# Patient Record
Sex: Female | Born: 1940 | Race: White | Hispanic: No | Smoking: Former smoker
Health system: Southern US, Community
[De-identification: ages and names within clinical notes are randomized; demographics above are authoritative.]

## PROBLEM LIST (undated history)

## (undated) DIAGNOSIS — M51369 Other intervertebral disc degeneration, lumbar region without mention of lumbar back pain or lower extremity pain: Secondary | ICD-10-CM

## (undated) DIAGNOSIS — J449 Chronic obstructive pulmonary disease, unspecified: Secondary | ICD-10-CM

## (undated) DIAGNOSIS — G4733 Obstructive sleep apnea (adult) (pediatric): Secondary | ICD-10-CM

## (undated) DIAGNOSIS — B029 Zoster without complications: Principal | ICD-10-CM

## (undated) DIAGNOSIS — I5181 Takotsubo syndrome: Secondary | ICD-10-CM

## (undated) DIAGNOSIS — J9610 Chronic respiratory failure, unspecified whether with hypoxia or hypercapnia: Secondary | ICD-10-CM

## (undated) DIAGNOSIS — J45909 Unspecified asthma, uncomplicated: Secondary | ICD-10-CM

## (undated) DIAGNOSIS — I2510 Atherosclerotic heart disease of native coronary artery without angina pectoris: Secondary | ICD-10-CM

## (undated) DIAGNOSIS — F419 Anxiety disorder, unspecified: Secondary | ICD-10-CM

## (undated) DIAGNOSIS — I219 Acute myocardial infarction, unspecified: Secondary | ICD-10-CM

## (undated) DIAGNOSIS — K219 Gastro-esophageal reflux disease without esophagitis: Secondary | ICD-10-CM

## (undated) DIAGNOSIS — D649 Anemia, unspecified: Secondary | ICD-10-CM

## (undated) DIAGNOSIS — E119 Type 2 diabetes mellitus without complications: Secondary | ICD-10-CM

## (undated) DIAGNOSIS — Z8711 Personal history of peptic ulcer disease: Secondary | ICD-10-CM

## (undated) DIAGNOSIS — I10 Essential (primary) hypertension: Secondary | ICD-10-CM

## (undated) DIAGNOSIS — E785 Hyperlipidemia, unspecified: Secondary | ICD-10-CM

## (undated) DIAGNOSIS — I739 Peripheral vascular disease, unspecified: Secondary | ICD-10-CM

## (undated) DIAGNOSIS — M5136 Other intervertebral disc degeneration, lumbar region: Secondary | ICD-10-CM

## (undated) DIAGNOSIS — Z9981 Dependence on supplemental oxygen: Secondary | ICD-10-CM

## (undated) DIAGNOSIS — I872 Venous insufficiency (chronic) (peripheral): Secondary | ICD-10-CM

## (undated) DIAGNOSIS — E669 Obesity, unspecified: Secondary | ICD-10-CM

## (undated) HISTORY — PX: LAPAROSCOPIC CHOLECYSTECTOMY: SUR755

## (undated) HISTORY — DX: Takotsubo syndrome: I51.81

## (undated) HISTORY — DX: Zoster without complications: B02.9

## (undated) HISTORY — DX: Anxiety disorder, unspecified: F41.9

## (undated) HISTORY — DX: Chronic obstructive pulmonary disease, unspecified: J44.9

## (undated) HISTORY — PX: SHOULDER OPEN ROTATOR CUFF REPAIR: SHX2407

## (undated) HISTORY — DX: Type 2 diabetes mellitus without complications: E11.9

## (undated) HISTORY — DX: Obesity, unspecified: E66.9

## (undated) HISTORY — PX: TOTAL ABDOMINAL HYSTERECTOMY: SHX209

## (undated) HISTORY — DX: Acute myocardial infarction, unspecified: I21.9

## (undated) HISTORY — DX: Essential (primary) hypertension: I10

## (undated) HISTORY — DX: Atherosclerotic heart disease of native coronary artery without angina pectoris: I25.10

## (undated) HISTORY — PX: CATARACT EXTRACTION W/ INTRAOCULAR LENS IMPLANT: SHX1309

## (undated) HISTORY — PX: CORONARY ANGIOPLASTY WITH STENT PLACEMENT: SHX49

## (undated) HISTORY — DX: Venous insufficiency (chronic) (peripheral): I87.2

## (undated) HISTORY — DX: Hyperlipidemia, unspecified: E78.5

## (undated) HISTORY — DX: Anemia, unspecified: D64.9

---
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
## 1998-03-05 DIAGNOSIS — I219 Acute myocardial infarction, unspecified: Secondary | ICD-10-CM

## 1998-03-05 HISTORY — DX: Acute myocardial infarction, unspecified: I21.9

## 1998-07-14 ENCOUNTER — Inpatient Hospital Stay (HOSPITAL_COMMUNITY): Admission: EM | Admit: 1998-07-14 | Discharge: 1998-07-17 | Payer: Self-pay | Admitting: Emergency Medicine

## 1999-02-20 ENCOUNTER — Ambulatory Visit (HOSPITAL_COMMUNITY): Admission: RE | Admit: 1999-02-20 | Discharge: 1999-02-21 | Payer: Self-pay | Admitting: *Deleted

## 1999-04-16 ENCOUNTER — Emergency Department (HOSPITAL_COMMUNITY): Admission: EM | Admit: 1999-04-16 | Discharge: 1999-04-16 | Payer: Self-pay | Admitting: Emergency Medicine

## 1999-04-17 ENCOUNTER — Encounter: Payer: Self-pay | Admitting: Emergency Medicine

## 1999-10-04 HISTORY — PX: ALVEOLOPLASTY: SUR19

## 1999-10-05 ENCOUNTER — Inpatient Hospital Stay (HOSPITAL_COMMUNITY): Admission: AD | Admit: 1999-10-05 | Discharge: 1999-10-12 | Payer: Self-pay | Admitting: Cardiology

## 1999-10-05 ENCOUNTER — Encounter: Payer: Self-pay | Admitting: Cardiology

## 1999-10-16 ENCOUNTER — Encounter (HOSPITAL_COMMUNITY): Admission: RE | Admit: 1999-10-16 | Discharge: 2000-01-14 | Payer: Self-pay | Admitting: Dentistry

## 2000-05-01 ENCOUNTER — Encounter: Payer: Self-pay | Admitting: Pulmonary Disease

## 2000-05-01 ENCOUNTER — Encounter (INDEPENDENT_AMBULATORY_CARE_PROVIDER_SITE_OTHER): Payer: Self-pay | Admitting: *Deleted

## 2000-05-01 ENCOUNTER — Observation Stay (HOSPITAL_COMMUNITY): Admission: AD | Admit: 2000-05-01 | Discharge: 2000-05-05 | Payer: Self-pay | Admitting: Pulmonary Disease

## 2000-05-02 ENCOUNTER — Encounter: Payer: Self-pay | Admitting: Pulmonary Disease

## 2001-10-02 ENCOUNTER — Ambulatory Visit (HOSPITAL_BASED_OUTPATIENT_CLINIC_OR_DEPARTMENT_OTHER): Admission: RE | Admit: 2001-10-02 | Discharge: 2001-10-02 | Payer: Self-pay | Admitting: Pulmonary Disease

## 2003-05-30 ENCOUNTER — Emergency Department (HOSPITAL_COMMUNITY): Admission: EM | Admit: 2003-05-30 | Discharge: 2003-05-30 | Payer: Self-pay | Admitting: Emergency Medicine

## 2004-01-07 ENCOUNTER — Ambulatory Visit: Payer: Self-pay | Admitting: Pulmonary Disease

## 2004-02-24 ENCOUNTER — Ambulatory Visit: Payer: Self-pay | Admitting: Pulmonary Disease

## 2004-03-29 ENCOUNTER — Ambulatory Visit: Payer: Self-pay | Admitting: Pulmonary Disease

## 2004-04-28 ENCOUNTER — Ambulatory Visit: Payer: Self-pay | Admitting: Pulmonary Disease

## 2004-05-24 ENCOUNTER — Ambulatory Visit: Payer: Self-pay | Admitting: Pulmonary Disease

## 2004-06-12 ENCOUNTER — Ambulatory Visit (HOSPITAL_COMMUNITY): Admission: RE | Admit: 2004-06-12 | Discharge: 2004-06-12 | Payer: Self-pay | Admitting: Pulmonary Disease

## 2004-06-23 ENCOUNTER — Ambulatory Visit: Payer: Self-pay | Admitting: Pulmonary Disease

## 2004-07-21 ENCOUNTER — Ambulatory Visit: Payer: Self-pay | Admitting: Pulmonary Disease

## 2004-08-21 ENCOUNTER — Ambulatory Visit: Payer: Self-pay | Admitting: Pulmonary Disease

## 2004-09-18 ENCOUNTER — Ambulatory Visit: Payer: Self-pay | Admitting: Pulmonary Disease

## 2004-10-18 ENCOUNTER — Ambulatory Visit: Payer: Self-pay | Admitting: Pulmonary Disease

## 2004-11-15 ENCOUNTER — Ambulatory Visit: Payer: Self-pay | Admitting: Pulmonary Disease

## 2004-12-13 ENCOUNTER — Ambulatory Visit: Payer: Self-pay | Admitting: Pulmonary Disease

## 2005-01-26 ENCOUNTER — Ambulatory Visit: Payer: Self-pay | Admitting: Pulmonary Disease

## 2005-02-27 ENCOUNTER — Encounter: Admission: RE | Admit: 2005-02-27 | Discharge: 2005-02-27 | Payer: Self-pay | Admitting: Pulmonary Disease

## 2005-02-28 ENCOUNTER — Ambulatory Visit: Payer: Self-pay | Admitting: Pulmonary Disease

## 2005-03-13 ENCOUNTER — Ambulatory Visit: Payer: Self-pay | Admitting: Cardiology

## 2005-03-28 ENCOUNTER — Ambulatory Visit: Payer: Self-pay

## 2005-03-28 ENCOUNTER — Encounter: Payer: Self-pay | Admitting: Cardiology

## 2005-04-30 ENCOUNTER — Ambulatory Visit: Payer: Self-pay | Admitting: Cardiology

## 2005-05-29 ENCOUNTER — Ambulatory Visit: Payer: Self-pay | Admitting: Pulmonary Disease

## 2005-06-05 ENCOUNTER — Encounter (HOSPITAL_COMMUNITY): Admission: RE | Admit: 2005-06-05 | Discharge: 2005-09-03 | Payer: Self-pay | Admitting: Pulmonary Disease

## 2005-06-07 ENCOUNTER — Ambulatory Visit: Payer: Self-pay | Admitting: Pulmonary Disease

## 2005-06-25 ENCOUNTER — Encounter: Admission: RE | Admit: 2005-06-25 | Discharge: 2005-07-25 | Payer: Self-pay | Admitting: Pulmonary Disease

## 2005-06-26 ENCOUNTER — Ambulatory Visit: Payer: Self-pay | Admitting: Pulmonary Disease

## 2005-07-24 ENCOUNTER — Ambulatory Visit: Payer: Self-pay | Admitting: Pulmonary Disease

## 2005-08-28 ENCOUNTER — Ambulatory Visit: Payer: Self-pay | Admitting: Pulmonary Disease

## 2005-08-30 ENCOUNTER — Ambulatory Visit (HOSPITAL_COMMUNITY): Admission: RE | Admit: 2005-08-30 | Discharge: 2005-08-30 | Payer: Self-pay | Admitting: Pulmonary Disease

## 2005-09-04 ENCOUNTER — Encounter (HOSPITAL_COMMUNITY): Admission: RE | Admit: 2005-09-04 | Discharge: 2005-12-03 | Payer: Self-pay | Admitting: Pulmonary Disease

## 2005-09-25 ENCOUNTER — Ambulatory Visit: Payer: Self-pay | Admitting: Pulmonary Disease

## 2005-10-26 ENCOUNTER — Ambulatory Visit: Payer: Self-pay | Admitting: Pulmonary Disease

## 2005-10-30 ENCOUNTER — Ambulatory Visit: Payer: Self-pay | Admitting: Cardiology

## 2005-11-26 ENCOUNTER — Ambulatory Visit: Payer: Self-pay | Admitting: Pulmonary Disease

## 2005-11-27 ENCOUNTER — Ambulatory Visit: Payer: Self-pay | Admitting: Pulmonary Disease

## 2005-12-12 ENCOUNTER — Ambulatory Visit: Payer: Self-pay | Admitting: Gastroenterology

## 2006-01-16 ENCOUNTER — Ambulatory Visit: Payer: Self-pay | Admitting: Gastroenterology

## 2006-01-25 ENCOUNTER — Ambulatory Visit: Payer: Self-pay | Admitting: Pulmonary Disease

## 2006-02-05 ENCOUNTER — Ambulatory Visit: Payer: Self-pay | Admitting: Gastroenterology

## 2006-02-05 LAB — CONVERTED CEMR LAB
Fecal Occult Blood: NEGATIVE
OCCULT 1: NEGATIVE
OCCULT 2: NEGATIVE
OCCULT 3: NEGATIVE
OCCULT 5: NEGATIVE

## 2006-02-14 ENCOUNTER — Ambulatory Visit: Payer: Self-pay | Admitting: Pulmonary Disease

## 2006-04-12 ENCOUNTER — Ambulatory Visit: Payer: Self-pay | Admitting: Cardiology

## 2006-05-16 ENCOUNTER — Ambulatory Visit: Payer: Self-pay | Admitting: Pulmonary Disease

## 2006-06-10 ENCOUNTER — Ambulatory Visit: Payer: Self-pay | Admitting: Pulmonary Disease

## 2006-06-10 LAB — CONVERTED CEMR LAB
Calcium: 9.5 mg/dL (ref 8.4–10.5)
Chloride: 106 meq/L (ref 96–112)
Cholesterol: 157 mg/dL (ref 0–200)
Creatinine, Ser: 0.9 mg/dL (ref 0.4–1.2)
Eosinophils Absolute: 0.1 10*3/uL (ref 0.0–0.6)
GFR calc Af Amer: 81 mL/min
Glucose, Bld: 210 mg/dL — ABNORMAL HIGH (ref 70–99)
HCT: 35 % — ABNORMAL LOW (ref 36.0–46.0)
Iron: 57 ug/dL (ref 42–145)
MCHC: 33.3 g/dL (ref 30.0–36.0)
MCV: 87.1 fL (ref 78.0–100.0)
Monocytes Relative: 6.8 % (ref 3.0–11.0)
RBC: 4.01 M/uL (ref 3.87–5.11)
RDW: 13.7 % (ref 11.5–14.6)
Saturation Ratios: 14.2 % — ABNORMAL LOW (ref 20.0–50.0)
Sodium: 143 meq/L (ref 135–145)
Total Bilirubin: 0.5 mg/dL (ref 0.3–1.2)

## 2006-08-29 ENCOUNTER — Ambulatory Visit: Payer: Self-pay | Admitting: Cardiology

## 2006-08-29 ENCOUNTER — Ambulatory Visit: Payer: Self-pay | Admitting: Internal Medicine

## 2006-08-29 ENCOUNTER — Observation Stay (HOSPITAL_COMMUNITY): Admission: EM | Admit: 2006-08-29 | Discharge: 2006-08-30 | Payer: Self-pay | Admitting: Emergency Medicine

## 2006-08-30 ENCOUNTER — Encounter: Payer: Self-pay | Admitting: Cardiology

## 2006-09-10 ENCOUNTER — Ambulatory Visit: Payer: Self-pay

## 2006-09-17 ENCOUNTER — Ambulatory Visit: Payer: Self-pay | Admitting: Pulmonary Disease

## 2006-09-17 LAB — CONVERTED CEMR LAB
Chloride: 102 meq/L (ref 96–112)
Cholesterol: 185 mg/dL (ref 0–200)
GFR calc Af Amer: 92 mL/min
GFR calc non Af Amer: 76 mL/min
Glucose, Bld: 187 mg/dL — ABNORMAL HIGH (ref 70–99)
LDL Cholesterol: 109 mg/dL — ABNORMAL HIGH (ref 0–99)
Sodium: 144 meq/L (ref 135–145)
Triglycerides: 182 mg/dL — ABNORMAL HIGH (ref 0–149)
VLDL: 36 mg/dL (ref 0–40)

## 2006-09-18 ENCOUNTER — Ambulatory Visit: Payer: Self-pay

## 2007-01-02 DIAGNOSIS — I872 Venous insufficiency (chronic) (peripheral): Secondary | ICD-10-CM

## 2007-01-02 DIAGNOSIS — M810 Age-related osteoporosis without current pathological fracture: Secondary | ICD-10-CM | POA: Insufficient documentation

## 2007-01-02 DIAGNOSIS — E785 Hyperlipidemia, unspecified: Secondary | ICD-10-CM

## 2007-01-02 DIAGNOSIS — E119 Type 2 diabetes mellitus without complications: Secondary | ICD-10-CM

## 2007-01-02 DIAGNOSIS — K5900 Constipation, unspecified: Secondary | ICD-10-CM | POA: Insufficient documentation

## 2007-01-02 DIAGNOSIS — J449 Chronic obstructive pulmonary disease, unspecified: Secondary | ICD-10-CM

## 2007-01-02 DIAGNOSIS — M1990 Unspecified osteoarthritis, unspecified site: Secondary | ICD-10-CM | POA: Insufficient documentation

## 2007-01-02 DIAGNOSIS — K219 Gastro-esophageal reflux disease without esophagitis: Secondary | ICD-10-CM | POA: Insufficient documentation

## 2007-01-02 DIAGNOSIS — F411 Generalized anxiety disorder: Secondary | ICD-10-CM | POA: Insufficient documentation

## 2007-01-17 ENCOUNTER — Ambulatory Visit: Payer: Self-pay | Admitting: Pulmonary Disease

## 2007-01-17 LAB — CONVERTED CEMR LAB
Chloride: 104 meq/L (ref 96–112)
Cholesterol: 150 mg/dL (ref 0–200)
Creatinine, Ser: 0.9 mg/dL (ref 0.4–1.2)
GFR calc Af Amer: 81 mL/min
Total CHOL/HDL Ratio: 4.3
Triglycerides: 185 mg/dL — ABNORMAL HIGH (ref 0–149)
VLDL: 37 mg/dL (ref 0–40)

## 2007-04-08 ENCOUNTER — Encounter: Payer: Self-pay | Admitting: Pulmonary Disease

## 2007-05-01 ENCOUNTER — Telehealth: Payer: Self-pay | Admitting: Pulmonary Disease

## 2007-05-16 ENCOUNTER — Ambulatory Visit: Payer: Self-pay | Admitting: Pulmonary Disease

## 2007-05-16 DIAGNOSIS — J0100 Acute maxillary sinusitis, unspecified: Secondary | ICD-10-CM | POA: Insufficient documentation

## 2007-05-17 LAB — CONVERTED CEMR LAB
ALT: 23 units/L (ref 0–35)
Albumin: 3.4 g/dL — ABNORMAL LOW (ref 3.5–5.2)
Alkaline Phosphatase: 45 units/L (ref 39–117)
CO2: 31 meq/L (ref 19–32)
Eosinophils Absolute: 0.1 10*3/uL (ref 0.0–0.6)
Eosinophils Relative: 2.1 % (ref 0.0–5.0)
GFR calc Af Amer: 81 mL/min
GFR calc non Af Amer: 67 mL/min
Hemoglobin: 12.1 g/dL (ref 12.0–15.0)
LDL Cholesterol: 70 mg/dL (ref 0–99)
Lymphocytes Relative: 24.4 % (ref 12.0–46.0)
Monocytes Absolute: 0.5 10*3/uL (ref 0.2–0.7)
Monocytes Relative: 8.8 % (ref 3.0–11.0)
Neutro Abs: 3.6 10*3/uL (ref 1.4–7.7)
Neutrophils Relative %: 64 % (ref 43.0–77.0)
Platelets: 222 10*3/uL (ref 150–400)
Potassium: 4.8 meq/L (ref 3.5–5.1)
Total Protein: 6.3 g/dL (ref 6.0–8.3)
Triglycerides: 193 mg/dL — ABNORMAL HIGH (ref 0–149)
WBC: 5.5 10*3/uL (ref 4.5–10.5)

## 2007-05-21 ENCOUNTER — Ambulatory Visit: Payer: Self-pay | Admitting: Cardiology

## 2007-05-29 LAB — CONVERTED CEMR LAB: Vit D, 1,25-Dihydroxy: 15 — ABNORMAL LOW (ref 30–89)

## 2007-09-18 ENCOUNTER — Ambulatory Visit: Payer: Self-pay | Admitting: Pulmonary Disease

## 2007-09-18 DIAGNOSIS — J209 Acute bronchitis, unspecified: Secondary | ICD-10-CM | POA: Insufficient documentation

## 2007-09-20 LAB — CONVERTED CEMR LAB
AST: 18 units/L (ref 0–37)
Albumin: 3.6 g/dL (ref 3.5–5.2)
Alkaline Phosphatase: 45 units/L (ref 39–117)
BUN: 15 mg/dL (ref 6–23)
Basophils Relative: 0.8 % (ref 0.0–3.0)
CO2: 33 meq/L — ABNORMAL HIGH (ref 19–32)
Calcium: 9 mg/dL (ref 8.4–10.5)
Cholesterol: 183 mg/dL (ref 0–200)
Eosinophils Relative: 2.6 % (ref 0.0–5.0)
GFR calc Af Amer: 80 mL/min
GFR calc non Af Amer: 66 mL/min
Glucose, Bld: 197 mg/dL — ABNORMAL HIGH (ref 70–99)
Hgb A1c MFr Bld: 7.1 % — ABNORMAL HIGH (ref 4.6–6.0)
LDL Cholesterol: 117 mg/dL — ABNORMAL HIGH (ref 0–99)
Lymphocytes Relative: 27.5 % (ref 12.0–46.0)
MCV: 93.6 fL (ref 78.0–100.0)
Neutro Abs: 2.6 10*3/uL (ref 1.4–7.7)
Neutrophils Relative %: 60.2 % (ref 43.0–77.0)
Platelets: 237 10*3/uL (ref 150–400)
Potassium: 4.3 meq/L (ref 3.5–5.1)
Sodium: 141 meq/L (ref 135–145)
Total CHOL/HDL Ratio: 4.6
Total Protein: 6.6 g/dL (ref 6.0–8.3)
Triglycerides: 129 mg/dL (ref 0–149)

## 2007-10-02 ENCOUNTER — Ambulatory Visit (HOSPITAL_COMMUNITY): Admission: RE | Admit: 2007-10-02 | Discharge: 2007-10-02 | Payer: Self-pay | Admitting: Ophthalmology

## 2007-11-07 ENCOUNTER — Ambulatory Visit: Payer: Self-pay | Admitting: Cardiology

## 2007-11-25 ENCOUNTER — Telehealth (INDEPENDENT_AMBULATORY_CARE_PROVIDER_SITE_OTHER): Payer: Self-pay | Admitting: *Deleted

## 2007-12-24 ENCOUNTER — Telehealth (INDEPENDENT_AMBULATORY_CARE_PROVIDER_SITE_OTHER): Payer: Self-pay | Admitting: *Deleted

## 2007-12-25 ENCOUNTER — Ambulatory Visit: Payer: Self-pay | Admitting: Internal Medicine

## 2008-01-06 ENCOUNTER — Telehealth (INDEPENDENT_AMBULATORY_CARE_PROVIDER_SITE_OTHER): Payer: Self-pay | Admitting: *Deleted

## 2008-01-16 ENCOUNTER — Ambulatory Visit: Payer: Self-pay | Admitting: Pulmonary Disease

## 2008-01-16 DIAGNOSIS — M79609 Pain in unspecified limb: Secondary | ICD-10-CM

## 2008-01-17 LAB — CONVERTED CEMR LAB
CO2: 33 meq/L — ABNORMAL HIGH (ref 19–32)
Chloride: 104 meq/L (ref 96–112)
GFR calc non Af Amer: 30 mL/min
Glucose, Bld: 133 mg/dL — ABNORMAL HIGH (ref 70–99)
Hgb A1c MFr Bld: 6.9 % — ABNORMAL HIGH (ref 4.6–6.0)

## 2008-01-20 ENCOUNTER — Encounter: Payer: Self-pay | Admitting: Pulmonary Disease

## 2008-01-20 ENCOUNTER — Ambulatory Visit: Payer: Self-pay

## 2008-01-22 ENCOUNTER — Telehealth (INDEPENDENT_AMBULATORY_CARE_PROVIDER_SITE_OTHER): Payer: Self-pay | Admitting: *Deleted

## 2008-02-12 ENCOUNTER — Telehealth (INDEPENDENT_AMBULATORY_CARE_PROVIDER_SITE_OTHER): Payer: Self-pay | Admitting: *Deleted

## 2008-03-01 ENCOUNTER — Telehealth: Payer: Self-pay | Admitting: Pulmonary Disease

## 2008-03-03 ENCOUNTER — Encounter: Payer: Self-pay | Admitting: Pulmonary Disease

## 2008-03-08 ENCOUNTER — Telehealth (INDEPENDENT_AMBULATORY_CARE_PROVIDER_SITE_OTHER): Payer: Self-pay | Admitting: *Deleted

## 2008-03-09 ENCOUNTER — Telehealth (INDEPENDENT_AMBULATORY_CARE_PROVIDER_SITE_OTHER): Payer: Self-pay | Admitting: *Deleted

## 2008-03-12 ENCOUNTER — Telehealth (INDEPENDENT_AMBULATORY_CARE_PROVIDER_SITE_OTHER): Payer: Self-pay | Admitting: *Deleted

## 2008-03-13 ENCOUNTER — Encounter: Payer: Self-pay | Admitting: Pulmonary Disease

## 2008-03-15 ENCOUNTER — Ambulatory Visit: Payer: Self-pay | Admitting: Internal Medicine

## 2008-03-15 ENCOUNTER — Ambulatory Visit: Payer: Self-pay | Admitting: Adult Health

## 2008-03-17 ENCOUNTER — Telehealth (INDEPENDENT_AMBULATORY_CARE_PROVIDER_SITE_OTHER): Payer: Self-pay | Admitting: *Deleted

## 2008-03-24 ENCOUNTER — Telehealth (INDEPENDENT_AMBULATORY_CARE_PROVIDER_SITE_OTHER): Payer: Self-pay | Admitting: *Deleted

## 2008-04-01 ENCOUNTER — Encounter: Payer: Self-pay | Admitting: Pulmonary Disease

## 2008-04-15 ENCOUNTER — Encounter: Payer: Self-pay | Admitting: Pulmonary Disease

## 2008-04-19 ENCOUNTER — Ambulatory Visit: Payer: Self-pay | Admitting: Pulmonary Disease

## 2008-04-20 LAB — CONVERTED CEMR LAB
BUN: 14 mg/dL (ref 6–23)
Basophils Relative: 0.4 % (ref 0.0–3.0)
Bilirubin, Direct: 0.1 mg/dL (ref 0.0–0.3)
Calcium: 9.5 mg/dL (ref 8.4–10.5)
Creatinine, Ser: 0.9 mg/dL (ref 0.4–1.2)
Eosinophils Relative: 2.7 % (ref 0.0–5.0)
GFR calc non Af Amer: 66 mL/min
Glucose, Bld: 134 mg/dL — ABNORMAL HIGH (ref 70–99)
HDL: 42.5 mg/dL (ref >39.0)
Hemoglobin: 11.9 g/dL — ABNORMAL LOW (ref 12.0–15.0)
Hgb A1c MFr Bld: 7 % — ABNORMAL HIGH (ref 4.6–6.0)
LDL Cholesterol: 85 mg/dL (ref 0–99)
Lymphocytes Relative: 28.4 % (ref 12.0–46.0)
Monocytes Absolute: 0.3 10*3/uL (ref 0.1–1.0)
Monocytes Relative: 7.3 % (ref 3.0–12.0)
Neutro Abs: 3 10*3/uL (ref 1.4–7.7)
Neutrophils Relative %: 61.2 % (ref 43.0–77.0)
Platelets: 245 10*3/uL (ref 150–400)
RDW: 13 % (ref 11.5–14.6)
Saturation Ratios: 11.5 % — ABNORMAL LOW (ref 20.0–50.0)
Sodium: 141 meq/L (ref 135–145)
Total Bilirubin: 0.5 mg/dL (ref 0.3–1.2)
Total Protein: 6.5 g/dL (ref 6.0–8.3)
Transferrin: 316 mg/dL (ref >=212.0)
Triglycerides: 137 mg/dL (ref 0–149)

## 2008-05-13 ENCOUNTER — Encounter: Payer: Self-pay | Admitting: Pulmonary Disease

## 2008-06-18 LAB — CONVERTED CEMR LAB: Vit D, 25-Hydroxy: 26 ng/mL — ABNORMAL LOW (ref 30–89)

## 2008-06-23 ENCOUNTER — Encounter: Payer: Self-pay | Admitting: Pulmonary Disease

## 2008-07-15 ENCOUNTER — Encounter: Payer: Self-pay | Admitting: Pulmonary Disease

## 2008-10-04 ENCOUNTER — Telehealth (INDEPENDENT_AMBULATORY_CARE_PROVIDER_SITE_OTHER): Payer: Self-pay | Admitting: *Deleted

## 2008-12-08 ENCOUNTER — Encounter (INDEPENDENT_AMBULATORY_CARE_PROVIDER_SITE_OTHER): Payer: Self-pay | Admitting: *Deleted

## 2008-12-08 ENCOUNTER — Ambulatory Visit: Payer: Self-pay | Admitting: Pulmonary Disease

## 2008-12-08 LAB — CONVERTED CEMR LAB
CO2: 35 meq/L
Creatinine, Ser: 1.1 mg/dL
GFR calc non Af Amer: 52.45 mL/min
Glucose, Bld: 151 mg/dL
HDL: 38.7 mg/dL
LDL Cholesterol: 71 mg/dL

## 2008-12-11 DIAGNOSIS — D649 Anemia, unspecified: Secondary | ICD-10-CM | POA: Insufficient documentation

## 2008-12-11 LAB — CONVERTED CEMR LAB
ALT: 21 units/L (ref 0–35)
AST: 18 units/L (ref 0–37)
BUN: 14 mg/dL (ref 6–23)
Basophils Relative: 0.3 % (ref 0.0–3.0)
Chloride: 102 meq/L (ref 96–112)
Cholesterol: 129 mg/dL (ref 0–200)
Creatinine, Ser: 1.1 mg/dL (ref 0.4–1.2)
Eosinophils Absolute: 0.1 10*3/uL (ref 0.0–0.7)
Eosinophils Relative: 2.5 % (ref 0.0–5.0)
GFR calc non Af Amer: 52.45 mL/min (ref >60)
Glucose, Bld: 151 mg/dL — ABNORMAL HIGH (ref 70–99)
Iron: 50 ug/dL (ref 42–145)
MCHC: 33.2 g/dL (ref 30.0–36.0)
MCV: 93.8 fL (ref 78.0–100.0)
Neutro Abs: 3.5 10*3/uL (ref 1.4–7.7)
Neutrophils Relative %: 68.4 % (ref 43.0–77.0)
Platelets: 220 10*3/uL (ref 150.0–400.0)
RDW: 12.3 % (ref 11.5–14.6)
Saturation Ratios: 11.7 % — ABNORMAL LOW (ref 20.0–50.0)
TSH: 1.1 microintl units/mL (ref 0.35–5.50)
Total CHOL/HDL Ratio: 3
Total Protein: 6.6 g/dL (ref 6.0–8.3)
Triglycerides: 96 mg/dL (ref 0.0–149.0)
WBC: 5 10*3/uL (ref 4.5–10.5)

## 2008-12-27 ENCOUNTER — Telehealth (INDEPENDENT_AMBULATORY_CARE_PROVIDER_SITE_OTHER): Payer: Self-pay | Admitting: *Deleted

## 2009-01-31 ENCOUNTER — Telehealth: Payer: Self-pay | Admitting: Pulmonary Disease

## 2009-02-14 ENCOUNTER — Telehealth (INDEPENDENT_AMBULATORY_CARE_PROVIDER_SITE_OTHER): Payer: Self-pay | Admitting: *Deleted

## 2009-02-24 ENCOUNTER — Telehealth (INDEPENDENT_AMBULATORY_CARE_PROVIDER_SITE_OTHER): Payer: Self-pay | Admitting: *Deleted

## 2009-04-15 ENCOUNTER — Encounter: Payer: Self-pay | Admitting: Pulmonary Disease

## 2009-04-19 ENCOUNTER — Encounter (INDEPENDENT_AMBULATORY_CARE_PROVIDER_SITE_OTHER): Payer: Self-pay | Admitting: *Deleted

## 2009-04-26 ENCOUNTER — Ambulatory Visit: Payer: Self-pay | Admitting: Pulmonary Disease

## 2009-04-27 ENCOUNTER — Ambulatory Visit: Payer: Self-pay | Admitting: Cardiology

## 2009-04-27 DIAGNOSIS — Z9861 Coronary angioplasty status: Secondary | ICD-10-CM

## 2009-04-27 DIAGNOSIS — I2510 Atherosclerotic heart disease of native coronary artery without angina pectoris: Secondary | ICD-10-CM

## 2009-04-27 DIAGNOSIS — I10 Essential (primary) hypertension: Secondary | ICD-10-CM

## 2009-04-28 LAB — CONVERTED CEMR LAB
AST: 20 units/L (ref 0–37)
Alkaline Phosphatase: 66 units/L (ref 39–117)
Bilirubin, Direct: 0.1 mg/dL (ref 0.0–0.3)
Calcium: 9.1 mg/dL (ref 8.4–10.5)
Eosinophils Absolute: 0.1 10*3/uL (ref 0.0–0.7)
Eosinophils Relative: 2 % (ref 0.0–5.0)
GFR calc non Af Amer: 58.48 mL/min (ref >60)
Hgb A1c MFr Bld: 8.1 % — ABNORMAL HIGH (ref 4.6–6.5)
Lymphocytes Relative: 22.7 % (ref 12.0–46.0)
Lymphs Abs: 1.4 10*3/uL (ref 0.7–4.0)
MCHC: 33.5 g/dL (ref 30.0–36.0)
MCV: 94.3 fL (ref 78.0–100.0)
Monocytes Relative: 7.9 % (ref 3.0–12.0)
Platelets: 225 10*3/uL (ref 150.0–400.0)
Potassium: 4.5 meq/L (ref 3.5–5.1)
Saturation Ratios: 13.8 % — ABNORMAL LOW (ref 20.0–50.0)
Total Bilirubin: 0.7 mg/dL (ref 0.3–1.2)
Vit D, 25-Hydroxy: 27 ng/mL — ABNORMAL LOW (ref 30–89)

## 2009-06-08 ENCOUNTER — Encounter: Payer: Self-pay | Admitting: Pulmonary Disease

## 2009-06-09 ENCOUNTER — Encounter: Payer: Self-pay | Admitting: Cardiology

## 2009-09-09 ENCOUNTER — Encounter: Payer: Self-pay | Admitting: Pulmonary Disease

## 2009-10-24 ENCOUNTER — Ambulatory Visit: Payer: Self-pay | Admitting: Pulmonary Disease

## 2009-10-30 LAB — CONVERTED CEMR LAB
CO2: 32 meq/L (ref 19–32)
Calcium: 9.1 mg/dL (ref 8.4–10.5)
Cholesterol: 130 mg/dL (ref 0–200)
Glucose, Bld: 215 mg/dL — ABNORMAL HIGH (ref 70–99)
HDL: 30 mg/dL — ABNORMAL LOW (ref >39.00)
Potassium: 4.5 meq/L (ref 3.5–5.1)
TSH: 0.83 microintl units/mL (ref 0.35–5.50)

## 2009-11-22 ENCOUNTER — Telehealth (INDEPENDENT_AMBULATORY_CARE_PROVIDER_SITE_OTHER): Payer: Self-pay | Admitting: *Deleted

## 2009-11-22 DIAGNOSIS — M25569 Pain in unspecified knee: Secondary | ICD-10-CM

## 2009-11-23 ENCOUNTER — Ambulatory Visit: Payer: Self-pay | Admitting: Cardiology

## 2009-12-01 ENCOUNTER — Encounter: Payer: Self-pay | Admitting: Pulmonary Disease

## 2009-12-07 ENCOUNTER — Encounter (INDEPENDENT_AMBULATORY_CARE_PROVIDER_SITE_OTHER): Payer: Self-pay | Admitting: *Deleted

## 2009-12-07 ENCOUNTER — Ambulatory Visit: Payer: Self-pay | Admitting: Cardiology

## 2009-12-19 ENCOUNTER — Encounter: Payer: Self-pay | Admitting: Cardiology

## 2009-12-20 LAB — CONVERTED CEMR LAB
BUN: 27 mg/dL — ABNORMAL HIGH (ref 6–23)
Calcium: 9.5 mg/dL (ref 8.4–10.5)
Chloride: 104 meq/L (ref 96–112)
Glucose, Bld: 139 mg/dL — ABNORMAL HIGH (ref 70–99)

## 2009-12-21 ENCOUNTER — Encounter: Payer: Self-pay | Admitting: Cardiology

## 2009-12-23 ENCOUNTER — Telehealth (INDEPENDENT_AMBULATORY_CARE_PROVIDER_SITE_OTHER): Payer: Self-pay | Admitting: *Deleted

## 2010-01-18 ENCOUNTER — Telehealth (INDEPENDENT_AMBULATORY_CARE_PROVIDER_SITE_OTHER): Payer: Self-pay | Admitting: *Deleted

## 2010-02-28 ENCOUNTER — Telehealth: Payer: Self-pay | Admitting: Pulmonary Disease

## 2010-03-22 ENCOUNTER — Telehealth: Payer: Self-pay | Admitting: Pulmonary Disease

## 2010-03-23 ENCOUNTER — Telehealth: Payer: Self-pay | Admitting: Pulmonary Disease

## 2010-04-06 NOTE — Progress Notes (Signed)
Summary: referral req to ortho----knee pain  Phone Note Call from Patient Call back at [facility] Phone [phone]   Caller: Patient Call For: nadel Summary of Call: pt wants an ortho referral. says right knee keeps "going out on her". when she walks she almost falls. says she has some pain in it "just before this happens. also says her two big toes feel numb. (worse in right big toe- same leg that she has knee problems). note: pt is diabetic as well Initial call taken by: Tivis Ringer, CNA,  November 22, 2009 2:08 PM  Follow-up for Phone Call        called and spoke with pt. pt states she would like to be referred to ortho for R knee pain.  Pt states pain has been going on "for awhile" but yesterday she "almost fell 5 x d/t the pain and feeling like her knee was giving out on her."  Asked pt if she has ever seen Ortho before and pt states she couldn't remember.   Pt also wanted SN to be aware she has noticed her "big toe" on both feet going numb recently.  Pt states she is diabetic and is unsure if her DM is causing this.  Pt states her toes are pink in color and denies any trauma to them to have caused the numbness.  Will forward message to SN to address.  Aundra Millet Reynolds LPN  November 22, 2009 2:26 PM   allergies: Brethine, Codeine  Additional Follow-up for Phone Call Additional follow up Details #1::        per SN, ok to refer to orth for eval of her knee- either Dr. Despina Hick or whoever ortho's choice is to eval knees.  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   called and spoke with pt.  pt is aware we are going to refer her to ortho for knee pain.  Pt also aware that numbness in toes is probably related to DM.  Pt verbalized understanding.  Nothing further needed.  Arman Filter LPN  November 22, 2009 3:41 PM   New Problems: KNEE PAIN, RIGHT (ICD-719.46)   New Problems: KNEE PAIN, RIGHT (ICD-719.46)

## 2010-04-06 NOTE — Letter (Signed)
Summary: BP LOG  BP LOG   Imported By: Faythe Ghee 12/07/2009 13:26:58  _____________________________________________________________________  External Attachment:    Type:   Image     Comment:   External Document

## 2010-04-06 NOTE — Letter (Signed)
Summary: [facility] [employer]  [address] [place] [address]   Phone: [phone]  Fax: [phone]     April 27, 2009 MRN: [id number]   [name] [address] [name], [name]      YOUR LAB WORK IS DUE  October 24, 2009 _________________________________________  Please go to [facility], located across the street from [facility] on the second floor.  Hours are Monday - Friday 7am until 7:30pm         Saturday 8am until 12noon    _X_  DO NOT EAT OR DRINK AFTER MIDNIGHT EVENING PRIOR TO LABWORK  __ YOUR LABWORK IS NOT FASTING --YOU MAY EAT PRIOR TO LABWORK

## 2010-04-06 NOTE — Assessment & Plan Note (Signed)
Summary: f44m  Medications Added LISINOPRIL 20 MG TABS (LISINOPRIL) take 1 tablet by mouth once daily      Allergies Added:   Visit Type:  Follow-up Primary Provider:  Dr. Alroy Dust   History of Present Illness: 70 year old woman presents for a followup visit. She just saw Dr. Kriste Basque yesterday for a routine visit. From a cardiac perspective, she denies any progressive chest pain or breathlessness, NYHA class III at baseline. She has not used any sublingual nitroglycerin. Denies any hospitalizations.  Lipid profile from October 2010 revealed LDL 71, HDL 38, triglycerides 96, total cholesterol 129. Followup labs per Dr. Kriste Basque from February 22 revealed potassium 4.5, BUN 12, creatinine 1.0, AST 20, ALT 21, hemoglobin 13.4, platelets 225.  Blood pressure is elevated today, and she states that this has been the general trend of late. She reports compliance with her medications. He talked about a low sodium diet, and advancing medical therapy.  Current Medications (verified): 1)  Flonase 50 Mcg/act  Susp (Fluticasone Propionate) .... Use 2 Puffs in Each Nostril Twice Daily.Marland KitchenMarland Kitchen 2)  Albuterol Sulfate (2.5 Mg/31ml) 0.083% Nebu (Albuterol Sulfate) .... Inhale 1 Vial Via Hhn Four Times A Day 3)  Ipratropium Bromide 0.02 % Soln (Ipratropium Bromide) .... Inhale 1 Vial Via Hhn Four Times A Day 4)  Pulmicort 0.25 Mg/49ml  Susp (Budesonide) .... Use in Neb Two Times A Day 5)  Mucinex 600 Mg  Tb12 (Guaifenesin) .... Take 2 By Mouth Two Times A Day W/ Plenty of Fluids.Marland KitchenMarland Kitchen 6)  Aspirin 81 Mg  Tbec (Aspirin) .... Take 1 By Mouth Once Daily 7)  Nitrostat 0.3 Mg  Subl (Nitroglycerin) .... As Needed As Directed 8)  Atenolol 50 Mg  Tabs (Atenolol) .... Take 1 By Mouth Once Daily 9)  Lisinopril 20 Mg Tabs (Lisinopril) .... Take 1 Tablet By Mouth Once Daily 10)  Furosemide 40 Mg  Tabs (Furosemide) .... Take 1 By Mouth Once Daily, May Use 1 Extra Daily As Needed Swelling 11)  Simvastatin 80 Mg Tabs (Simvastatin)  .... Take 1 Tab By Mouth At Bedtime. Please Call Office For An Appt. 12)  Humulin 70/30 70-30 %  Susp (Insulin Isophane & Regular) .... Inject 20  Units Subcutaneously in The Morning and 15  Units Subcutaneously in The Evening 13)  Glucophage 500 Mg  Tabs (Metformin Hcl) .... Take 1 By Mouth Two Times A Day 14)  Omeprazole 20 Mg Tbec (Omeprazole) .... Take 1 Tab By Mouth Once Daily... 15)  Etodolac 400 Mg  Tabs (Etodolac) .... Take 1 By Mouth Every 12 Hours 16)  Boniva 150 Mg  Tabs (Ibandronate Sodium) .... Take One Tablet By Mouth Every Month 17)  Oscal 500/200 D-3 500-200 Mg-Unit  Tabs (Calcium-Vitamin D) .... Take One By Mouth Once Daily 18)  Vitamin D 1000 Unit Tabs (Cholecalciferol) .... Take 1 Cap Daily... 19)  Gabapentin 100 Mg Caps (Gabapentin) .... Take 1 By Mouth At Bedtime For 10d, Then 2 At Bedtime For 10d, Then 3 At Bedtime Thereafter... 20)  Alprazolam 0.5 Mg  Tabs (Alprazolam) .... Take 1/2 -1 Tablet By Mouth Three Times A Day As Needed 21)  Iron 325 (65 Fe) Mg  Tabs (Ferrous Sulfate) .... Take 1 By Mouth Once Daily 22)  Onetouch Ultra Test   Strp (Glucose Blood) .... Test Blood Sugar Two Times A Day 23)  Amoxicillin-Pot Clavulanate 875-125 Mg Tabs (Amoxicillin-Pot Clavulanate) .... Take 1 Tab By Mouth Two Times A Day Til Gone...  Allergies (verified): 1)  ! Codeine 2)  !  Brethine (Terbutaline Sulfate)  Past History:  Past Medical History: Last updated: 04/06/2009 Anemia Anxiety Arthritis CAD - PTCA/BMS LAD 2000, PTCA/brachytherapy LAD 2001, LVEF 55% C O P D Diabetes Type 2 G E R D Hyperlipidemia Hypertension Osteoporosis Venous insufficiency Obesity Myocardial Infarction - AMI 2000  Social History: Last updated: 04/06/2009 Widowed  Tobacco Use - Former, quit 2000 Alcohol Use - no Drug Use - no  Clinical Review Panels:  Echocardiogram Echocardiogram  -  LVEF is approximately 55% with hypokineiss of the distal         inferior, mid/distal inferoseptal  walls. Overall left         ventricular systolic function was normal. Left ventricular         wall thickness was mildly increased.   -  Difficult acoustic windows, especially apically.    IMPRESSIONS   -  Difficult acoustic windows, especially apically. (08/30/2006)    Review of Systems  The patient denies anorexia, fever, vision loss, syncope, peripheral edema, prolonged cough, hemoptysis, melena, and hematochezia.         She reports occasional belching and reflux, with a hiatal hernia. No recent cough or wheezing, no fevers or chills, appetite stable. No regular exercise. Otherwise reviewed and negative except as outlined above.  Vital Signs:  Patient profile:   70 year old female Weight:      238 pounds O2 Sat:      98 % on 2 L/min Pulse rate:   63 / minute BP sitting:   210 / 92  (right arm)  Vitals Entered By: Dreama Saa, CNA (April 27, 2009 10:55 AM)  O2 Flow:  2 L/min  Physical Exam  Additional Exam:  Obese woman in no acute distress. HEENT: Conjunctiva and lids normal, oropharynx is moist mucosa. Neck: Supple, no carotid bruits or thyromegaly. Lungs: Significantly diminished breath sounds throughout, nonlabored, no wheezing. Cardiac: Distant regular heart sounds, soft systolic murmur at the base, no S3. Abdomen: Obese, nontender, no organomegaly, bowel sounds present. Skin: Warm and dry. Extremities: Trace ankle edema. Distal pulses one plus. Musculoskeletal: No kyphosis. Neuropsychiatric: Alert and oriented x3, affect appropriate.   EKG  Procedure date:  04/27/2009  Findings:      Normal sinus rhythm at 61 beats per minute with left anterior fascicular block, poor anterior R-wave progression, diffuse nonspecific ST-T wave changes.  Impression & Recommendations:  Problem # 1:  CORONARY ATHEROSCLEROSIS NATIVE CORONARY ARTERY (ICD-414.01)  Symptomatically stable on present medical regimen. Electrocardiogram is chronically abnormal. No nitroglycerin  use at this time. Followup noninvasive ischemic testing is not being pursued at this time, likely fraught with complications secondary to the patient's severe lung disease, and also body habitus. We will follow her symptomatically at this point, I will see her back in 6 months.  Her updated medication list for this problem includes:    Aspirin 81 Mg Tbec (Aspirin) .Marland Kitchen... Take 1 by mouth once daily    Nitrostat 0.3 Mg Subl (Nitroglycerin) .Marland Kitchen... As needed as directed    Atenolol 50 Mg Tabs (Atenolol) .Marland Kitchen... Take 1 by mouth once daily    Lisinopril 20 Mg Tabs (Lisinopril) .Marland Kitchen... Take 1 tablet by mouth once daily  Problem # 2:  ESSENTIAL HYPERTENSION, BENIGN (ICD-401.1)  Blood pressure poorly controlled. Plan to advance lisinopril to 20 mg daily, and further up titration may be necessary. She will otherwise continue her present regimen.  Her updated medication list for this problem includes:    Aspirin 81 Mg Tbec (Aspirin) .Marland KitchenMarland KitchenMarland KitchenMarland Kitchen  Take 1 by mouth once daily    Atenolol 50 Mg Tabs (Atenolol) .Marland Kitchen... Take 1 by mouth once daily    Lisinopril 20 Mg Tabs (Lisinopril) .Marland Kitchen... Take 1 tablet by mouth once daily    Furosemide 40 Mg Tabs (Furosemide) .Marland Kitchen... Take 1 by mouth once daily, may use 1 extra daily as needed swelling  Problem # 3:  HYPERLIPIDEMIA (ICD-272.4)  LDL from October 2010 was at goal and recent liver function tests are normal. Continue present medication.  Her updated medication list for this problem includes:    Simvastatin 80 Mg Tabs (Simvastatin) .Marland Kitchen... Take 1 tab by mouth at bedtime. please call office for an appt.  Future Orders: T-Lipid Profile [id number]) ... 10/24/2009 T-Hepatic Function [id number]) ... 10/24/2009  Patient Instructions: 1)  Your physician recommends that you schedule a follow-up appointment in: 6 months 2)  Your physician has recommended you make the following change in your medication: increase Lisinopril to 20mg  by mouth once daily  3)  Your physician recommends  that you return for lab work in: 6 months Prescriptions: LISINOPRIL 20 MG TABS (LISINOPRIL) take 1 tablet by mouth once daily  #30 x 5   Entered by:   Larita Fife Via LPN   Authorized by:   Loreli Slot, MD, [facility]   Signed by:   Larita Fife Via LPN on 29/56/2130   Method used:   Electronically to        [facility]  [address] (retail)       [address]       [place], [place]  [address]       Ph: [phone]       Fax: [phone]   RxID:   [id number]

## 2010-04-06 NOTE — Miscellaneous (Signed)
Summary: Request for medical records/[facility]  Request for medical records/[facility]   Imported By: Sherian Rein 06/21/2009 07:56:26  _____________________________________________________________________  External Attachment:    Type:   Image     Comment:   External Document

## 2010-04-06 NOTE — Assessment & Plan Note (Signed)
Summary: rov/jd   CC:  4 month ROV & review of mult medical problems....  History of Present Illness: 70 y/o WF here for a follow up visit... she has mult med problems as listed below...    ~  she saw DrMcDowell at the [facility] office Sep09- note reviewed: s/p MI/ LAD stent & subseq cutting balloon angioplasty & brachytherapy for in-stent re-stenosis; same meds x incr Zocor80.   ~  seen Nov09 c/o leg discomfort- swelling, redness, tender, hurts in knees and to walk... she increased her Lasix to 1-2 tabs daily, and swelling is better- weight stable  ~253#... she has a second floor apt and diffuculty getting up the stairs... she would like a note stating that we perscribed the Mucinex 4/d so she can get the cost taken off her rent...  we decided to check Art & Venous dopplers= VenDopplers neg & ABI's not done... she notes that Neurontin helps!   ~  seen Jan10 by TP w/ bronchitic exas- Rx'd w/ Depo, Avelox, MucinexDM, change Lisinopril to Diovan (but too $$$)... CXR showed cardiomeg, prom right hilum, scarring, NAD...  ~  Feb10:  back to baseline w/ min cough, post nasal drip, clear mucus... using Nebs, Mucinex regularly... can't afford Diovan and wants Lisinopril restarted... insurance won't cover Protonix either, needs change to Omeprazole... needs fasting labs but has eaten today... unfortunately she hasn't lost any weight- morbidly obese, too sedentary and not motivated to change.  ~  Oct10:  she has mult somatic complaints- notes some sinus symptoms and intermittent epistaxis... she's gained 8# more to 258# and isn't motivated to diet or exercise- "I don't have the breath"... she decribes excess sleepiness, & has the habitus & symptoms of OSA- needs sleep study (she never did it)... requesting med refills & refuses Flu shot...   ~  April 26, 2009:  she has several complaints including>> blood sugars elevated recently (on 70/30 insulin & hasn't titrated as requested);  sinus infection (we  will Rx w/ Augmentin x10d);  legs/ back hurting- doesn't like her [facility] doc & requesting referral to [facility] based orthopedist (on Etodolac & Neurontin)...    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 Rx'd w/ MUCINEX, Nasal Saline, FLONASE...  2/11>> c/o sinusitis & AUGMENTIN perscribed.  COPD (ICD-496) - she has COPD and is an ex-smoker... controlled on NEBULIZER w/ Duoneb Qid + Budesonide Bid, MUCINEX, etc... she has end stage disease w/ severe dyspnea w/ ADLs and no change over the last yr...  ~  baseline CXR = COPD, incr heart size, NAD.Marland KitchenMarland Kitchen  ~ CT Chest 2/02 showed COPD, Atx, sl scarring, NAD.Marland Kitchen.  ~  bronchoscopy 3/02 w/ mucous plugging, no endobr lesions...  ~  PFT's in 12/07 showed FVC=0.81 (27%), FEV1=0.54 (22%), %1sec=67, mid-flows=13%pred...  ~  CXR 1/10 showed chr changes, cardiomeg, lingular atx...  ~  CXR 2/11 showed cardiomeg, bibasilar scarring, NAD...  HYPERTENSION (ICD-401.9) - controlled on ATENOLOL 50mg /d, LISINOPRIL 20mg /d, LASIX 40mg /d... BP=148/80 and tol well... denies HA, visual changes, CP, palipit, dizziness, syncope, change in dyspnea, edema, etc.  ~  Jan10: tried to change ACE to Diovan but too $$$ & requested change back to Lisinopril.  CORONARY ARTERY DISEASE (ICD-414.00) - on ASA 81mg /d... hx of CAD w/ prev MI in 2000, and subseq PTCA's in 2000, & 2001... followed by DrMcDowell at [facility] office & last seen 9/09- note reviewed (she will call for f/u).  ~  last  cath 2001 w/ PTCA to mid-LAD lesion w/ cutting balloon & brachytherapy for in-stent restenosis.  ~  last hosp 6/08 w/ CP- felt to be non-cardiac...  ~  last 2DECho 6/08 showed HK of inferoseptal area, EF=55%...  ~  last NuclearStressTest 7/08 w/ mod ant/apical/septal infarct, apical HK, no ischemia, EF=?53%...  VENOUS INSUFFICIENCY (ICD-459.81) - low sodium diet, elevation, & Lasix 40-80mg /d... states she "allergic" to TED  hose! and refuses to wear them...   HYPERLIPIDEMIA (ICD-272.4) - on ZOCOR 80mg /d + FISH OIL 1000mg /d ... she has been counselled on diet & exercise but unsuccessful...  ~  FLP 11/08 on Simva40 showed TChol 150, TG 185, HDL 35, LDL 78  ~  FLP 3/09 on Simva40 showed TChol 139, TG 193, HDL 30, LDL 70... DrMcDowell incr to Simva80 9/09.  ~  FLP 2/10 on Simva80 showed TChol 155, TG 137, HDL 43, LDL 85  ~  FLP 10/10 showed TChol 129, TG 96, HDL 39, LDL 71  DM (ICD-250.00) - on HUMULIN 70/30- taking 18uAM & 12uPM, GLUCOPHAGE 500mg Bid, AVANDIA 4mg Bid... she states all of her recent accuchecks have been under 150...  ~  BS=142 & HgA1c=6.6 in Nov08...  ~  Labs 3/09 showed BS= 112, HgA1c= 6.8  ~  labs 7/09 showed BS= 197, HgA1c= 7.1.Marland KitchenMarland Kitchen rec- better diet!  ~  labs 2/10 showed BS= 134, A1c= 7.0  ~  labs 10/10 showed BS= 151, A1c= 6.9  ~  labs 2/11 showed BS= 239, A1c= 8.1.Marland KitchenMarland Kitchen pt instructed to up titrate insulin til BS= 120-150 range.  OBESITY (ICD-278.00) - we discussed diet + exercise program required to lose weight!  ~  weight 2008 = 235#  ~  weight 2/10 = 250#  ~  weight 10/10 = 258#  ~  weight 2/11 = 241#  GERD (ICD-530.81) - on PPI therapy... changed 2/10 per request to OMEPRAZOLE 20mg /d...  CONSTIPATION (ICD-564.00) - last colonoscopy was 11/07 by DrJacobs and negative...  DEGENERATIVE JOINT DISEASE (ICD-715.90) - prev CSpine films w/ spondy and biforaminal narrowing... LSSpine films w/ DDD and osteopenia... she saw DrDrake, Ortho in [place], for shot in right foot  OSTEOPOROSIS (ICD-733.00) - on BONIVA 150mg /mo, Calcium, Vits... she needs a BMD...  ~  labs 2/11 showed Vit D level = 27... OK to change to 1000 u daily.  ANXIETY (ICD-300.00) - uses Alprazolam 0.5mg  as needed...  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  ~  labs 10/10 showed Hg= 11.9, Fe= 50... rec OTC Fe supplement.  ~  labs 2/11 showed Hg= 13.4, Fe= 52 (14%sat)  Health Maintenance - she hasn't seen  GYN in yrs and is asked to re-establish... prev refused Mammograms due to the discomfort & bruising- she will discuss w/ Gyn & get a follow up BMD as well...   Allergies: 1)  ! Codeine 2)  ! Brethine (Terbutaline Sulfate)  Comments:  Nurse/Medical Assistant: The patient's medications and allergies were reviewed with the patient and were updated in the Medication and Allergy Lists.  Past History:  Past Medical History:  Anemia Anxiety Arthritis CAD - PTCA/BMS LAD 2000, PTCA/brachytherapy LAD 2001, LVEF 55% C O P D Diabetes Type 2 G E R D Hyperlipidemia Hypertension Osteoporosis Venous insufficiency Obesity Myocardial Infarction - AMI 2000  Past Surgical History: Hysterectomy Cholecystectomy  Family History: Reviewed history from 04/06/2009 and no changes required. Family History of Coronary Artery Disease  Social History: Reviewed history from 04/06/2009 and no changes required. Widowed  Tobacco Use -  Former, quit 2000 Alcohol Use - no Drug Use - no  Review of Systems      See HPI       The patient complains of decreased hearing, dyspnea on exertion, and difficulty walking.  The patient denies anorexia, fever, weight loss, weight gain, vision loss, hoarseness, chest pain, syncope, peripheral edema, prolonged cough, headaches, hemoptysis, abdominal pain, melena, hematochezia, severe indigestion/heartburn, hematuria, incontinence, muscle weakness, suspicious skin lesions, transient blindness, depression, unusual weight change, abnormal bleeding, enlarged lymph nodes, and angioedema.    Vital Signs:  Patient profile:   70 year old female Height:      64.5 inches Weight:      241.25 pounds BMI:     40.92 O2 Sat:      91 % on Room air Temp:     97.5 degrees F oral Pulse rate:   65 / minute BP sitting:   148 / 80  (left arm) Cuff size:   large  Vitals Entered By: Randell Loop CMA (April 26, 2009 9:43 AM)  O2 Sat at Rest %:  91 O2 Flow:  Room air CC: 4  month ROV & review of mult medical problems... Is Patient Diabetic? Yes Pain Assessment Patient in pain? yes      Comments meds updated today   Physical Exam  Additional Exam:  WD, Obese, 70 y/o WF in NAD... GENERAL:  Alert & oriented; pleasant & cooperative...  HEENT:  /AT, EOM-wnl, PERRLA, EACs-clear, TMs-wnl, NOSE-clear, THROAT-clear & wnl. NECK:  Supple w/ fairROM; no JVD; normal carotid impulses w/o bruits; no thyromegaly or nodules palpated; no lymphadenopathy. CHEST:  Decr BS bilat, few scat rhonchi, without wheezing, rales, or rubs...upper airway psuedo-wheezing HEART:  Regular Rhythm;  gr 1/6 SEM without rubs or gallops detected... ABDOMEN:  Obese, soft & nontender; normal bowel sounds; no organomegaly or masses detected. EXT: without deformities, mild arthritic changes; no varicose veins/ +venous insuffic/ tr edema. NEURO:  CN's intact; no focal neuro deficits... DERM:  no lesion seen...    CXR  Procedure date:  04/26/2009  Findings:      CHEST - 2 VIEW Comparison: 03/15/2008   Findings: There is cardiomegaly.  Linear densities in the lung bases, stable compatible with scarring. There is hyperinflation of the lungs compatible with COPD.  No effusions or acute opacities.   Degenerative changes in the lumbar spine and shoulders.   IMPRESSION: COPD.  Bibasilar scarring.   Stable cardiomegaly.   Read By:  Charlett Nose,  M.D.    MISC. Report  Procedure date:  04/26/2009  Findings:      BMP (METABOL)   Sodium                    141 mEq/L                   135-145   Potassium                 4.5 mEq/L                   3.5-5.1   Chloride                  99 mEq/L                    96-112   Carbon Dioxide       [H]  34 mEq/L  19-32   Glucose              [H]  239 mg/dL                   16-60   BUN                       12 mg/dL                    6-30   Creatinine                1.0 mg/dL                   1.6-0.1   Calcium                    9.1 mg/dL                   0.9-32.3   GFR                       58.48 mL/min                >60  Hepatic/Liver Function Panel (HEPATIC)   Total Bilirubin           0.7 mg/dL                   5.5-7.3   Direct Bilirubin          0.1 mg/dL                   2.2-0.2   Alkaline Phosphatase      66 U/L                      39-117   AST                       20 U/L                      0-37   ALT                       21 U/L                      0-35   Total Protein             6.6 g/dL                    5.4-2.7   Albumin                   3.7 g/dL                    0.6-2.3  CBC Platelet w/Diff (CBCD)   White Cell Count          6.0 K/uL                    4.5-10.5   Red Cell Count            4.26 Mil/uL                 3.87-5.11   Hemoglobin                13.4 g/dL  12.0-15.0   Hematocrit                40.2 %                      36.0-46.0   MCV                       94.3 fl                     78.0-100.0   Platelet Count            225.0 K/uL                  150.0-400.0   Neutrophil %              66.9 %                      43.0-77.0   Lymphocyte %              22.7 %                      12.0-46.0   Monocyte %                7.9 %                       3.0-12.0   Eosinophils%              2.0 %                       0.0-5.0   Basophils %               0.5 %                       0.0-3.0  Comments:      IBC Panel (IBC)   Iron                      52 ug/dL                    16-109   Transferrin               269.6 mg/dL                 604.5-409.8   Iron Saturation      [L]  13.8 %                      20.0-50.0  Hemoglobin A1C (A1C)   Hemoglobin A1C       [H]  8.1 %                       4.6-6.5   Impression & Recommendations:  Problem # 1:  ACUTE MAXILLARY SINUSITIS (ICD-461.0) Discussed Rx w/ Augmentin x10d... needs better nasal hygiene>> Saline, Mucinex, Flonase. Her updated medication list for this problem includes:    Flonase 50 Mcg/act Susp  (Fluticasone propionate) ..... Use 2 puffs in each nostril twice daily...    Mucinex 600 Mg Tb12 (Guaifenesin) .Marland Kitchen... Take 2 by mouth two times a day w/ plenty of fluids...    Amoxicillin-pot Clavulanate 875-125 Mg Tabs (Amoxicillin-pot clavulanate) .Marland Kitchen... Take 1 tab by mouth two times a day til gone.Marland KitchenMarland Kitchen  Orders: TLB-Hepatic/Liver Function Pnl (80076-HEPATIC) TLB-CBC Platelet - w/Differential (85025-CBCD) TLB-IBC Pnl (Iron/FE;Transferrin) (83550-IBC) TLB-A1C / Hgb A1C (Glycohemoglobin) (83036-A1C) T-Vitamin D (25-Hydroxy) (57846-96295)  Problem # 2:  COPD (ICD-496) Severe disease on a good regimen... continue same Rx... needs incr exerc, lose wt, etc... Her updated medication list for this problem includes:    Albuterol Sulfate (2.5 Mg/74ml) 0.083% Nebu (Albuterol sulfate) ..... Inhale 1 vial via hhn four times a day    Ipratropium Bromide 0.02 % Soln (Ipratropium bromide) ..... Inhale 1 vial via hhn four times a day    Pulmicort 0.25 Mg/58ml Susp (Budesonide) ..... Use in neb two times a day  Orders: T-2 View CXR (71020TC)  Problem # 3:  ESSENTIAL HYPERTENSION, BENIGN (ICD-401.1) Fair control... Lisinopril incr to 20mg ... Her updated medication list for this problem includes:    Atenolol 50 Mg Tabs (Atenolol) .Marland Kitchen... Take 1 by mouth once daily    Lisinopril 20 Mg Tabs (Lisinopril) .Marland Kitchen... Take 1 tablet by mouth once daily    Furosemide 40 Mg Tabs (Furosemide) .Marland Kitchen... Take 1 by mouth once daily, may use 1 extra daily as needed swelling  Problem # 4:  CORONARY ATHEROSCLEROSIS NATIVE CORONARY ARTERY (ICD-414.01) Followed by drMcDowell & stable.... Her updated medication list for this problem includes:    Aspirin 81 Mg Tbec (Aspirin) .Marland Kitchen... Take 1 by mouth once daily    Nitrostat 0.3 Mg Subl (Nitroglycerin) .Marland Kitchen... As needed as directed    Atenolol 50 Mg Tabs (Atenolol) .Marland Kitchen... Take 1 by mouth once daily    Lisinopril 20 Mg Tabs (Lisinopril) .Marland Kitchen... Take 1 tablet by mouth once daily    Furosemide 40 Mg  Tabs (Furosemide) .Marland Kitchen... Take 1 by mouth once daily, may use 1 extra daily as needed swelling  Problem # 5:  HYPERLIPIDEMIA (ICD-272.4) Doing satis-  continue Rx. Her updated medication list for this problem includes:    Simvastatin 80 Mg Tabs (Simvastatin) .Marland Kitchen... Take 1 tab by mouth at bedtime. please call office for an appt.  Problem # 6:  DM (ICD-250.00) Poor control... we spent extra time reviewing the plan for up titration of her 7-/30 insulin to affect better BS control... She says she understands and will comply. Her updated medication list for this problem includes:    Aspirin 81 Mg Tbec (Aspirin) .Marland Kitchen... Take 1 by mouth once daily    Lisinopril 20 Mg Tabs (Lisinopril) .Marland Kitchen... Take 1 tablet by mouth once daily    Humulin 70/30 70-30 % Susp (Insulin isophane & regular) ..... Inject 20  units subcutaneously in the morning and 15  units subcutaneously in the evening    Glucophage 500 Mg Tabs (Metformin hcl) .Marland Kitchen... Take 1 by mouth two times a day  Problem # 7:  GERD (ICD-530.81) She has an abd wall herinia in the epig region as well... we are watching- non tender or painful... Her updated medication list for this problem includes:    Omeprazole 20 Mg Tbec (Omeprazole) .Marland Kitchen... Take 1 tab by mouth once daily...  Problem # 8:  DEGENERATIVE JOINT DISEASE (ICD-715.90) She is c/o leg discomfort & not satis w/ her Ortho in [place]... refer to DrYates for eval. The following medications were removed from the medication list:    Propoxyphene N-apap 100-650 Mg Tabs (Propoxyphene n-apap) .Marland Kitchen... Take 1 tab by mouth every 6-8 hours as needed for pain... Her updated medication list for this problem includes:    Aspirin 81 Mg Tbec (Aspirin) .Marland Kitchen... Take 1 by mouth once daily    Etodolac  400 Mg Tabs (Etodolac) .Marland Kitchen... Take 1 by mouth every 12 hours  Orders: Orthopedic Referral (Ortho)  Problem # 9:  OSTEOPOROSIS (ICD-733.00) She's been on Vit D 50K weekly... OK to change to 1000 u OTC supplement daily  now... Her updated medication list for this problem includes:    Boniva 150 Mg Tabs (Ibandronate sodium) .Marland Kitchen... Take one tablet by mouth every month  Problem # 10:  ANEMIA, MILD (ICD-285.9) Hg is improved- continue Fe... Her updated medication list for this problem includes:    Iron 325 (65 Fe) Mg Tabs (Ferrous sulfate) .Marland Kitchen... Take 1 by mouth once daily  Complete Medication List: 1)  Flonase 50 Mcg/act Susp (Fluticasone propionate) .... Use 2 puffs in each nostril twice daily.Marland KitchenMarland Kitchen 2)  Albuterol Sulfate (2.5 Mg/52ml) 0.083% Nebu (Albuterol sulfate) .... Inhale 1 vial via hhn four times a day 3)  Ipratropium Bromide 0.02 % Soln (Ipratropium bromide) .... Inhale 1 vial via hhn four times a day 4)  Pulmicort 0.25 Mg/30ml Susp (Budesonide) .... Use in neb two times a day 5)  Mucinex 600 Mg Tb12 (Guaifenesin) .... Take 2 by mouth two times a day w/ plenty of fluids.Marland KitchenMarland Kitchen 6)  Aspirin 81 Mg Tbec (Aspirin) .... Take 1 by mouth once daily 7)  Nitrostat 0.3 Mg Subl (Nitroglycerin) .... As needed as directed 8)  Atenolol 50 Mg Tabs (Atenolol) .... Take 1 by mouth once daily 9)  Lisinopril 20 Mg Tabs (Lisinopril) .... Take 1 tablet by mouth once daily 10)  Furosemide 40 Mg Tabs (Furosemide) .... Take 1 by mouth once daily, may use 1 extra daily as needed swelling 11)  Simvastatin 80 Mg Tabs (Simvastatin) .... Take 1 tab by mouth at bedtime. please call office for an appt. 12)  Humulin 70/30 70-30 % Susp (Insulin isophane & regular) .... Inject 20  units subcutaneously in the morning and 15  units subcutaneously in the evening 13)  Glucophage 500 Mg Tabs (Metformin hcl) .... Take 1 by mouth two times a day 14)  Omeprazole 20 Mg Tbec (Omeprazole) .... Take 1 tab by mouth once daily... 15)  Etodolac 400 Mg Tabs (Etodolac) .... Take 1 by mouth every 12 hours 16)  Boniva 150 Mg Tabs (Ibandronate sodium) .... Take one tablet by mouth every month 17)  Oscal 500/200 D-3 500-200 Mg-unit Tabs (Calcium-vitamin d) .... Take  one by mouth once daily 18)  Vitamin D 1000 Unit Tabs (Cholecalciferol) .... Take 1 cap daily... 19)  Gabapentin 100 Mg Caps (Gabapentin) .... Take 1 by mouth at bedtime for 10d, then 2 at bedtime for 10d, then 3 at bedtime thereafter... 20)  Alprazolam 0.5 Mg Tabs (Alprazolam) .... Take 1/2 -1 tablet by mouth three times a day as needed 21)  Iron 325 (65 Fe) Mg Tabs (Ferrous sulfate) .... Take 1 by mouth once daily 22)  Onetouch Ultra Test Strp (Glucose blood) .... Test blood sugar two times a day 23)  Amoxicillin-pot Clavulanate 875-125 Mg Tabs (Amoxicillin-pot clavulanate) .... Take 1 tab by mouth two times a day til gone...  Other Orders: Prescription Created Electronically [phone]) TLB-BMP (Basic Metabolic Panel-BMET) (80048-METABOL)  Patient Instructions: 1)  Today we updated your med list- see below.... 2)  We wrote a new perscription for AUGMENTIN to take twice daily for 10 days to treat your sinus infection... continue the SALINE spray every 1-2H and the Mucinex+ Fluids as discussed... 3)  For your DM:  increase the insulin doses by 2 u until the readings are in the 120-150  range... 4)  Also- change to the OTC Vitamin D supplement 1000 u daily.Marland KitchenMarland Kitchen 5)  Today we did your follow up CXR & blood work... please call the "phone tree" in a few days for your lab results.Marland KitchenMarland Kitchen 6)  We will arrange for an Orthopedic eval of your leg pain.Marland KitchenMarland Kitchen 7)  Call for any questions... 8)  Please schedule a follow-up appointment in 4-6 months. Prescriptions: BONIVA 150 MG  TABS (IBANDRONATE SODIUM) take one tablet by mouth every month  #1 Each x 5   Entered by:   Randell Loop CMA   Authorized by:   Michele Mcalpine MD   Signed by:   Randell Loop CMA on 04/26/2009   Method used:   Print then Give to Patient   RxID:   [id number] ALPRAZOLAM 0.5 MG  TABS (ALPRAZOLAM) take 1/2 -1 tablet by mouth three times a day as needed  #90 x 5   Entered by:   Randell Loop CMA   Authorized by:   Michele Mcalpine MD   Signed  by:   Randell Loop CMA on 04/26/2009   Method used:   Print then Give to Patient   RxID:   [id number] GABAPENTIN 100 MG CAPS (GABAPENTIN) take 1 by mouth at bedtime for 10d, then 2 at bedtime for 10d, then 3 at bedtime thereafter...  #100 Each x 5   Entered by:   Randell Loop CMA   Authorized by:   Michele Mcalpine MD   Signed by:   Randell Loop CMA on 04/26/2009   Method used:   Print then Give to Patient   RxID:   [id number] ETODOLAC 400 MG  TABS (ETODOLAC) take 1 by mouth every 12 hours  #60 Each x 6   Entered by:   Randell Loop CMA   Authorized by:   Michele Mcalpine MD   Signed by:   Randell Loop CMA on 04/26/2009   Method used:   Print then Give to Patient   RxID:   [id number] GLUCOPHAGE 500 MG  TABS (METFORMIN HCL) take 1 by mouth two times a day  #60 Each x 6   Entered by:   Randell Loop CMA   Authorized by:   Michele Mcalpine MD   Signed by:   Randell Loop CMA on 04/26/2009   Method used:   Print then Give to Patient   RxID:   [id number] SIMVASTATIN 80 MG TABS (SIMVASTATIN) Take 1 tab by mouth at bedtime. Please call office for an appt.  #30 x prn   Entered by:   Randell Loop CMA   Authorized by:   Michele Mcalpine MD   Signed by:   Randell Loop CMA on 04/26/2009   Method used:   Print then Give to Patient   RxID:   [id number] FUROSEMIDE 40 MG  TABS (FUROSEMIDE) take 1 by mouth once daily, may use 1 extra daily as needed swelling  #60 Each x prn   Entered by:   Randell Loop CMA   Authorized by:   Michele Mcalpine MD   Signed by:   Randell Loop CMA on 04/26/2009   Method used:   Print then Give to Patient   RxID:   [id number] LISINOPRIL 10 MG TABS (LISINOPRIL) take 1 tab by mouth once daily...  #30 Each x prn   Entered by:   Randell Loop CMA   Authorized by:   Michele Mcalpine MD   Signed by:   Randell Loop CMA on  04/26/2009   Method used:   Print then Give to Patient   RxID:   [id number] ATENOLOL 50 MG  TABS (ATENOLOL) take 1 by mouth once daily   #30 Each x prn   Entered by:   Randell Loop CMA   Authorized by:   Michele Mcalpine MD   Signed by:   Randell Loop CMA on 04/26/2009   Method used:   Print then Give to Patient   RxID:   [id number] NITROSTAT 0.3 MG  SUBL (NITROGLYCERIN) as needed as directed  #25 x prn   Entered by:   Randell Loop CMA   Authorized by:   Michele Mcalpine MD   Signed by:   Randell Loop CMA on 04/26/2009   Method used:   Print then Give to Patient   RxID:   [id number] FLONASE 50 MCG/ACT  SUSP (FLUTICASONE PROPIONATE) use 2 puffs in each nostril twice daily...  #1 x prn   Entered by:   Randell Loop CMA   Authorized by:   Michele Mcalpine MD   Signed by:   Randell Loop CMA on 04/26/2009   Method used:   Print then Give to Patient   RxID:   [id number] AMOXICILLIN-POT CLAVULANATE 875-125 MG TABS (AMOXICILLIN-POT CLAVULANATE) take 1 tab by mouth two times a day til gone...  #20 x 0   Entered and Authorized by:   Michele Mcalpine MD   Signed by:   Michele Mcalpine MD on 04/26/2009   Method used:   Print then Give to Patient   RxID:   [id number]

## 2010-04-06 NOTE — Letter (Signed)
Summary: [facility]  [address]   [place], [place] [address]   Phone: [phone]  Fax: [phone]    10/24/2009  [name] [address] [name], [place]  [address]  To Whom It May Concern,  [name] is a 70 y/o patient of mine followed for general medical purposes.  She has COPD & Chronic Bronchitis among her problems.  This condition requires her to take [facility] 600mg - 2 tabs twice daily.  This OTC medicine is an important part of her treatment program and it is fairly expensive.  Thank you for your consideration in this matter.  If I can be of further assistance please feel free to contact my office.  Sincerely,   Alroy Dust MD

## 2010-04-06 NOTE — Letter (Signed)
Summary: [facility]  [facility]   Imported By: Lester Concrete 12/13/2009 07:53:54  _____________________________________________________________________  External Attachment:    Type:   Image     Comment:   External Document

## 2010-04-06 NOTE — Progress Notes (Signed)
Summary: prescription  Phone Note Call from Patient Call back at [facility] Phone [phone]   Caller: Patient Call For: Dr. Kriste Basque Summary of Call: Patient phoned stated that she has been coughing really bad, hurting in sides due to coughing so much. She thinks that she has bronchitis and would like something called into [facility] in [place] [phone] for her cough. She doesn't want to come out in this dampness. She can be reached at [phone] Initial call taken by: Vedia Coffer,  February 28, 2010 8:38 AM  Follow-up for Phone Call        Pt c/o productive cough with yellow phlegm, increased SOB, hoarseness, chest tightness, wheezing, chills, body aches all x 4 days. Pt states she has coughed do much her sides are sore. Please advise.Carron Curie CMA  February 28, 2010 10:04 AM allergies: codeine, brethine  Additional Follow-up for Phone Call Additional follow up Details #1::        per SN---ok for augmentin 875mg   #14  1 by mouth two times a day , mucinex dm  2 by mouth two times a day with plenty of fluids, delsym otc cough syrup  2 tsp two times a day ,   all rx cough meds have codeine in them--- pt is concerned about the delsym causing her sugar to be elevated so she will have her daughter check with the pharmacy to see what kind of diabetic cough meds she can take. Randell Loop [facility]  February 28, 2010 1:54 PM     Prescriptions: AUGMENTIN 875-125 MG TABS (AMOXICILLIN-POT CLAVULANATE) 1 by mouth twice a day  #14 x 0   Entered by:   Randell Loop CMA   Authorized by:   Michele Mcalpine MD   Signed by:   Randell Loop CMA on 02/28/2010   Method used:   Electronically to        [facility]  [address] (retail)       [address]       [place], [place]  [address]       Ph: [phone]       Fax: [phone]   RxID:   [id number]

## 2010-04-06 NOTE — Assessment & Plan Note (Signed)
Summary: 6 mth f/u per checkout on 04/27/09/tg  Medications Added HUMULIN 70/30 70-30 %  SUSP (INSULIN ISOPHANE & REGULAR) inject 25  units subcutaneously in the morning and 20  units subcutaneously in the evening HYDROCODONE-ACETAMINOPHEN 10-660 MG TABS (HYDROCODONE-ACETAMINOPHEN) take as needed for pain      Allergies Added:   Visit Type:  Follow-up Primary Provider:  Dr. Alroy Dust   History of Present Illness: 70 year old woman presents for followup. I saw her back in February of this year. She has seen Dr. Kriste Basque in the interim. Reports no problems with progressive angina or unusual shortness of breath. Limited by right knee pain which is to undergo further evaluation per patient description.  Followup labs from 22 August ordered by Dr. Kriste Basque showed sodium 141, potassium 4.5, BUN 19, creatinine 1.3, hemoglobin A1c 8.4%, cholesterol 1:30, triglycerides 152, HDL 30, LDL 70, TSH 0.83. She has been on high-dose simvastatin for some time now, tolerating it well.  Blood pressure is not optimally controlled today. She did tolerate the increase in lisinopril that we made at her last visit. Her blood pressure looked much better at the office visit with Dr. Kriste Basque in August. She does not check it at home.  Clinical Review Panels:  Echocardiogram Echocardiogram  -  LVEF is approximately 55% with hypokineiss of the distal         inferior, mid/distal inferoseptal walls. Overall left         ventricular systolic function was normal. Left ventricular         wall thickness was mildly increased.   -  Difficult acoustic windows, especially apically.    IMPRESSIONS   -  Difficult acoustic windows, especially apically. (08/30/2006)  Cardiac Imaging Cardiac Cath Findings Successful percutaneous transluminal coronary angioplasty of the mid LAD with reduction of 70% narrowing to less than 10% with a 3.0-mm cutting balloon and adjuvant administration of brachy beta radiation for in-stent  restenosis.  Junious Silk (10/10/1999)    Current Medications (verified): 1)  Flonase 50 Mcg/act  Susp (Fluticasone Propionate) .... Use 2 Puffs in Each Nostril Twice Daily.Marland KitchenMarland Kitchen 2)  Albuterol Sulfate (2.5 Mg/51ml) 0.083% Nebu (Albuterol Sulfate) .... Inhale 1 Vial Via Hhn Four Times A Day 3)  Ipratropium Bromide 0.02 % Soln (Ipratropium Bromide) .... Inhale 1 Vial Via Hhn Four Times A Day 4)  Pulmicort 0.25 Mg/60ml  Susp (Budesonide) .... Use in Neb Two Times A Day 5)  Mucinex 600 Mg  Tb12 (Guaifenesin) .... Take 2 By Mouth Two Times A Day W/ Plenty of Fluids.Marland KitchenMarland Kitchen 6)  Aspirin 81 Mg  Tbec (Aspirin) .... Take 1 By Mouth Once Daily 7)  Nitrostat 0.3 Mg  Subl (Nitroglycerin) .... As Needed As Directed 8)  Atenolol 50 Mg  Tabs (Atenolol) .... Take 1 By Mouth Once Daily 9)  Lisinopril 20 Mg Tabs (Lisinopril) .... Take 1 Tablet By Mouth Once Daily 10)  Furosemide 40 Mg  Tabs (Furosemide) .... Take 1 By Mouth Once Daily, May Use 1 Extra Daily As Needed Swelling 11)  Simvastatin 80 Mg Tabs (Simvastatin) .... Take 1 Tab By Mouth At Bedtime. Please Call Office For An Appt. 12)  Fish Oil 1000 Mg Caps (Omega-3 Fatty Acids) .... Take 1 Tablet By Mouth Once A Day 13)  Humulin 70/30 70-30 %  Susp (Insulin Isophane & Regular) .... Inject 25  Units Subcutaneously in The Morning and 20  Units Subcutaneously in The Evening 14)  Glucophage 500 Mg  Tabs (Metformin Hcl) .... Take 1 By  Mouth Two Times A Day 15)  Omeprazole 20 Mg Tbec (Omeprazole) .... Take 1 Tab By Mouth Once Daily... 16)  Etodolac 400 Mg  Tabs (Etodolac) .... Take 1 By Mouth Every 12 Hours 17)  Boniva 150 Mg  Tabs (Ibandronate Sodium) .... Take One Tablet By Mouth Every Month 18)  Oscal 500/200 D-3 500-200 Mg-Unit  Tabs (Calcium-Vitamin D) .... Take One By Mouth Once Daily 19)  Vitamin D 1000 Unit Tabs (Cholecalciferol) .... Take 1 Cap Daily... 20)  Gabapentin 100 Mg Caps (Gabapentin) .... Take 1 By Mouth At Bedtime For 10d, Then 2 At Bedtime  For 10d, Then 3 At Bedtime Thereafter... 21)  Alprazolam 0.5 Mg  Tabs (Alprazolam) .... Take 1/2 -1 Tablet By Mouth Three Times A Day As Needed 22)  Iron 325 (65 Fe) Mg  Tabs (Ferrous Sulfate) .... Take 1 By Mouth Once Daily 23)  B Complex-Vitamin B12  Tabs (B Complex-Folic Acid) .... Take 1 Tablet By Mouth Once A Day 24)  Onetouch Ultra Test   Strp (Glucose Blood) .... Test Blood Sugar Two Times A Day 25)  Hydrocodone-Acetaminophen 10-660 Mg Tabs (Hydrocodone-Acetaminophen) .... Take As Needed For Pain  Allergies (verified): 1)  ! Codeine 2)  ! Brethine  Past History:  Social History: Last updated: 04/06/2009 Widowed  Tobacco Use - Former, quit 2000 Alcohol Use - no Drug Use - no  Past Medical History: Anemia Anxiety Arthritis CAD - PTCA/BMS LAD 2000, PTCA/brachytherapy LAD 2001, LVEF 55% C O P D Diabetes Type 2 G E R D Hyperlipidemia Hypertension Osteoporosis Venous insufficiency Obesity Myocardial Infarction - AMI 2000  Review of Systems       The patient complains of dyspnea on exertion.  The patient denies anorexia, fever, chest pain, syncope, peripheral edema, melena, and hematochezia.         Otherwise reviewed and negative.  Vital Signs:  Patient profile:   70 year old female Weight:      216 pounds BMI:     36.64 Pulse rate:   69 / minute BP sitting:   175 / 83  (right arm)  Vitals Entered By: Dreama Saa, CNA (November 23, 2009 8:48 AM)  Physical Exam  Additional Exam:  Overweight woman in no acute distress. HEENT: Conjunctiva and lids normal, oropharynx with moist mucosa. Neck: Supple, no elevated JVP or carotid bruits. Lungs: Diminished breath sounds throughout, no wheezes, nonlabored. Cardiac: Distant regular heart sounds, soft systolic murmur at the base, no S3. Abdomen: Obese, nontender, no organomegaly, bowel sounds present. Skin: Warm and dry. Extremities: Trace ankle edema. Distal pulses one plus. Musculoskeletal: No  kyphosis. Neuropsychiatric: Alert and oriented x3, affect appropriate.   EKG  Procedure date:  11/23/2009  Findings:      Sinus rhythm at 63 beats per minute with PR interval of 212 ms, left anterior fascicular block, evidence of previous anterior wall infarct. No significant change compared to prior tracing from February.  Impression & Recommendations:  Problem # 1:  CORONARY ATHEROSCLEROSIS NATIVE CORONARY ARTERY (ICD-414.01)  Symptomatically stable without active angina on medical therapy. Electrocardiogram is also stable. Continue to follow symptomatically. Office visit in 6 months.  Her updated medication list for this problem includes:    Aspirin 81 Mg Tbec (Aspirin) .Marland Kitchen... Take 1 by mouth once daily    Nitrostat 0.3 Mg Subl (Nitroglycerin) .Marland Kitchen... As needed as directed    Atenolol 50 Mg Tabs (Atenolol) .Marland Kitchen... Take 1 by mouth once daily    Lisinopril 20 Mg Tabs (Lisinopril) .Marland KitchenMarland KitchenMarland KitchenMarland Kitchen  Take 1 tablet by mouth once daily  Problem # 2:  ESSENTIAL HYPERTENSION, BENIGN (ICD-401.1)  Blood pressure is elevated today, although much better controlled at her last office visit with Dr. Kriste Basque in August. We talked about arranging to have blood pressure checked at home, and following up over the next few weeks for a nurse blood pressure check. Further medication adjustments as needed.  Her updated medication list for this problem includes:    Aspirin 81 Mg Tbec (Aspirin) .Marland Kitchen... Take 1 by mouth once daily    Atenolol 50 Mg Tabs (Atenolol) .Marland Kitchen... Take 1 by mouth once daily    Lisinopril 20 Mg Tabs (Lisinopril) .Marland Kitchen... Take 1 tablet by mouth once daily    Furosemide 40 Mg Tabs (Furosemide) .Marland Kitchen... Take 1 by mouth once daily, may use 1 extra daily as needed swelling  Problem # 3:  HYPERLIPIDEMIA (ICD-272.4)  Recent lipid profile showed good control of LDL.  Her updated medication list for this problem includes:    Simvastatin 80 Mg Tabs (Simvastatin) .Marland Kitchen... Take 1 tab by mouth at bedtime. please call office  for an appt.  Patient Instructions: 1)  Your physician recommends that you schedule a follow-up appointment in: Nurse visit to check blood pressure in 2 weeks please bring your BP diary with you to visit. ALSO, YOU WILL FOLLOW UP WITH DR. McDOWELL IN 6 MONTHS 2)  Your physician recommends that you continue on your current medications as directed. Please refer to the Current Medication list given to you today. 3)  Your physician has requested that you regularly monitor and record your blood pressure readings at home.  Please use the same machine at the same time of day to check your readings and record them to bring to your follow-up visit.

## 2010-04-06 NOTE — Progress Notes (Signed)
Summary: bronchitis  Phone Note Call from Patient   Caller: Patient Call For: nadel Summary of Call: bronchitis coughing and chest congestion [facility] [place] Initial call taken by: Rickard Patience,  December 23, 2009 8:40 AM  Follow-up for Phone Call        Spoke with pt.  She c/o prod cough with thick, yellow sputum x 3 days, also has sinus and chest congestion.  She states that she was wheezing last night when she lied down.  Denies any fever or other symptoms.  Last seen here 8/22 by SN.  Pls advise thanks allergic to codiene and brethine.   Follow-up by: Vernie Murders,  December 23, 2009 9:09 AM  Additional Follow-up for Phone Call Additional follow up Details #1::        per SN---ok for pt to have augmentin 875mg   #14  1 by mouth two times a day  and increase the mucinex 2 by mouth two times a day with plenty of fluids and ok to use the delsym for cough otc.  thanks Randell Loop CMA  December 23, 2009 12:40 PM   pt aware of sn's response and antibiotic sent to pharmacy pt requested Additional Follow-up by: Philipp Deputy CMA,  December 23, 2009 1:56 PM    New/Updated Medications: AUGMENTIN 875-125 MG TABS (AMOXICILLIN-POT CLAVULANATE) 1 by mouth twice a day Prescriptions: AUGMENTIN 875-125 MG TABS (AMOXICILLIN-POT CLAVULANATE) 1 by mouth twice a day  #14 x 0   Entered by:   Philipp Deputy CMA   Authorized by:   Michele Mcalpine MD   Signed by:   Philipp Deputy CMA on 12/23/2009   Method used:   Electronically to        [facility] (retail)       [address]       [place], [place]  [address]       Ph: [phone]       Fax: [phone]   RxID:   [id number]

## 2010-04-06 NOTE — Letter (Signed)
Summary: [place] [facility] Work [facility], agricultural at [employer]  [address], [place] [address]   Phone: [phone]  Fax: [phone]     December 07, 2009 MRN: [id number]   [name] [address] [name], [place]  [address]      YOUR LAB WORK IS DUE   December 21, 2009  Please go to [facility], located across the street from [facility] on the second floor.  Hours are Monday - Friday 7am until 7:30pm         Saturday 8am until 12noon    __  DO NOT EAT OR DRINK AFTER MIDNIGHT EVENING PRIOR TO LABWORK  _X_ YOUR LABWORK IS NOT FASTING --YOU MAY EAT PRIOR TO LABWORK

## 2010-04-06 NOTE — Medication Information (Signed)
Summary: RX Folder/ [facility] MEDICATION LIST  RX Folder/ [facility] MEDICATION LIST   Imported By: Dorise Hiss 06/09/2009 12:14:37  _____________________________________________________________________  External Attachment:    Type:   Image     Comment:   External Document

## 2010-04-06 NOTE — Progress Notes (Signed)
Summary: TOOTH EXTRACTION - ATC x1  Phone Note From Other Clinic   [phone] Call For: Laiyla Slagel Summary of Call: DOES PT NEED TO STOP ANY MEDS BEFORE TOOTH EXTRACTION Initial call taken by: Lacinda Axon,  March 22, 2010 12:07 PM  Follow-up for Phone Call        ATC # provided - NA and was told that the number is not accepting voicemails.  WCB. Boone Master CNA/MA  March 22, 2010 2:03 PM   spoke with dentist office and pt is scheduled to have one tooth extracted and they want to know if she needs to come off any of her meds before this is done and if SN has any further recs. She is going to have novacaine injection for numbing purposes, she willnot be given anesthesia. Please advsie.Carron Curie CMA  March 23, 2010 9:48 AM    Additional Follow-up for Phone Call Additional follow up Details #1::        per SN----ok to use the novocaine without epi---pt will not need to stop any other meds prior to tooth extraction.  called and spoke with dentist office and they are aware of SN recs.

## 2010-04-06 NOTE — Letter (Signed)
Summary: [place] Results Engineer, agricultural at [facility]  [address] [place] [address]   Phone: [phone]  Fax: [phone]      December 21, 2009 MRN: [id number]   [name] [address] [name], [place]  [address]   Dear Ms. Joseph Art,  Your test ordered by Selena Batten has been reviewed by your physician (or physician assistant) and was found to be normal or stable. Your physician (or physician assistant) felt no changes were needed at this time.  ____ Echocardiogram  ____ Cardiac Stress Test  __X__ Lab Work  ____ Peripheral vascular study of arms, legs or neck  ____ CT scan or X-ray  ____ Lung or Breathing test  ____ Other:  Please continue on current medical treatment. Thank you.   Nona Dell, MD, F.A.C.C

## 2010-04-06 NOTE — Progress Notes (Signed)
Summary: clarrification on return OV  Phone Note Call from Patient Call back at [facility] Phone [phone]   Caller: Patient Call For: nadel Summary of Call: pt was last seen 10/24/09. she says her bottle of sivastatin states that she needs to call for an appt before refilling. she already has an appt pending for 04/24/10. it doesn't look like she needs to return before then and pt says she feels fine and doesn't know why she needs an appt before feb next yr.  Initial call taken by: Tivis Ringer, CNA,  January 18, 2010 9:57 AM  Follow-up for Phone Call        Looks like back in 11/04/2008 this message was put on the refill by Dr. Remi Deter McDowell's office and never was taken off med instructions.  Pt was last seen by Dr. Kriste Basque in 10/2009 and told to f/u in 6 months.  She has a pending OV for 04/24/2010.  Pt should disregard the message that she needs to call office for an appt.  Pt is aware of this above.  [employer], spoke with Delaney Meigs.  She will take the message ' pls call office to make appt ' off of the simvastatin so pt will no longer receive this message.   Follow-up by: Gweneth Dimitri RN,  January 18, 2010 10:33 AM    New/Updated Medications: SIMVASTATIN 80 MG TABS (SIMVASTATIN) Take 1 tab by mouth at bedtime.

## 2010-04-06 NOTE — Letter (Signed)
Summary: [facility]  [facility]   Imported By: Lester [place] 09/13/2009 10:42:43  _____________________________________________________________________  External Attachment:    Type:   Image     Comment:   External Document

## 2010-04-06 NOTE — Progress Notes (Signed)
Summary: cough/ wheezing  Phone Note Call from Patient Call back at [facility] Phone [phone]   Caller: Patient Call For: Sapir Lavey Summary of Call: pt c/o cough w/ grey phlegm x 1 day. also sinus congestion. wheezing. no N or V. no fever. is taking musinex every day. [facility] [place]. (pt did not want to be seen).  Initial call taken by: Tivis Ringer, CNA,  March 23, 2010 8:49 AM  Follow-up for Phone Call        Spoke with pt and she states she was given augmentin on 02-28-10 for cough and congestion and did get better, but since yesterday she has been having increased productive cough with dark gray phlegm, head congestion, "cold" in her eyes, and wheezing. Pt states she "knows it is bronchitis" and does not want to come in for an appt. She has been taking mucinex since yesterday. Please advise. Carron Curie CMA  March 23, 2010 9:54 AM allergies: codeine, brethine  Additional Follow-up for Phone Call Additional follow up Details #1::        per SN---ok for pt to have augmentin 875mg   #20   1 by mouth two times a day  and increase the mucinex 1200mg  by mouth two times a day with plenty of fluids.  called and spoke with pt and she is aware of meds sent to the pharmacy Randell Loop CMA  March 23, 2010 3:13 PM     Prescriptions: AUGMENTIN 875-125 MG TABS (AMOXICILLIN-POT CLAVULANATE) 1 by mouth twice a day  #20 x 0   Entered by:   Randell Loop CMA   Authorized by:   Michele Mcalpine MD   Signed by:   Randell Loop CMA on 03/23/2010   Method used:   Electronically to        [facility]  [address] (retail)       [address]       [place], [place]  [address]       Ph: [phone]       Fax: [phone]   RxID:   [id number]

## 2010-04-06 NOTE — Assessment & Plan Note (Signed)
Summary: 6 months/apc   Primary Care Provider:  Dr. Alroy Dust  CC:  6 month ROV & review of mult medical problems....  History of Present Illness: 70 y/o WF here for a follow up visit... she has mult med problems as listed below...    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   ~  seen Nov09 c/o leg discomfort- swelling, redness, tender, hurts in knees and to walk... she increased her Lasix to 1-2 tabs daily, and swelling is better- weight stable  ~253#... she has a second floor apt and diffuculty getting up the stairs... she would like a note stating that we perscribed the Mucinex 4/d so she can get the cost taken off her rent...  we decided to check Art & Venous dopplers= VenDopplers neg & ABI's not done... she notes that Neurontin helps!   ~  seen Jan10 by TP w/ bronchitic exas- Rx'd w/ Depo, Avelox, MucinexDM, change Lisinopril to Diovan (but too $$$)... CXR showed cardiomeg, prom right hilum, scarring, NAD...  ~  Feb10:  back to baseline w/ min cough, post nasal drip, clear mucus... using Nebs, Mucinex regularly... can't afford Diovan and wants Lisinopril restarted... insurance won't cover Protonix either, needs change to Omeprazole... needs fasting labs but has eaten today... unfortunately she hasn't lost any weight- morbidly obese, too sedentary and not motivated to change.  ~  Oct10:  she has mult somatic complaints- notes some sinus symptoms and intermittent epistaxis... she's gained 8# more to 258# and isn't motivated to diet or exercise- "I don't have the breath"... she decribes excess sleepiness, & has the habitus & symptoms of OSA- needs sleep study (she never did it)... requesting med refills & refuses Flu shot...   ~  April 26, 2009:  she has several complaints including>> blood sugars elevated recently (on 70/30 insulin &  hasn't titrated as requested);  sinus infection (we will Rx w/ Augmentin x10d);  legs/ back hurting- doesn't like her [facility] doc & requesting referral to [facility] based orthopedist (on Etodolac & Neurontin)...   ~  October 24, 2009:  76mo ROV- BS are all over the place (up to 400, down to 100) & we discussed adjusting her 70/30 insulin accordingly (her weight is down to 217#)... under alot of stress w/ 85 y/o mother "driving me nuts"... she saw DrMcDowell 2/11- BP sl elev & Lisinopril incr to 20mg /d... currently on 25u AM & 20uPM> rec incr doses slowly & titrate to BS values (discussed w/ pt, again).   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 Rx'd w/ MUCINEX, Nasal Saline, FLONASE...  2/11>> c/o sinusitis & AUGMENTIN perscribed.  COPD (ICD-496) - she has COPD and is an ex-smoker... controlled on NEBULIZER w/ Duoneb Qid + Budesonide Bid, MUCINEX, etc... she has end stage disease w/ severe dyspnea w/ ADLs and no change over the last yr...  ~  baseline CXR = COPD, incr heart size, NAD.Marland KitchenMarland Kitchen  ~ CT Chest 2/02 showed COPD, Atx, sl scarring, NAD.Marland Kitchen.  ~  bronchoscopy 3/02 w/ mucous plugging, no endobr lesions...  ~  PFT's in 12/07 showed FVC=0.81 (27%), FEV1=0.54 (22%), %1sec=67, mid-flows=13%pred...  ~  CXR 1/10 showed chr changes, cardiomeg, lingular atx...  ~  CXR 2/11 showed cardiomeg, bibasilar scarring, NAD...  HYPERTENSION (ICD-401.9) - controlled on ATENOLOL  50mg /d, LISINOPRIL 20mg /d, LASIX 40mg /d... BP=128/74 and tol well... denies HA, visual changes, CP, palipit, dizziness, syncope, change in dyspnea, edema, etc.  ~  Jan10: tried to change ACE to Diovan but too $$$ & requested change back to Lisinopril.  CORONARY ARTERY DISEASE (ICD-414.00) - on ASA 81mg /d... hx of CAD w/ prev MI in 2000, and subseq PTCA's in 2000, & 2001... followed by DrMcDowell at [facility] office & last seen 9/09- note reviewed (she will call for  f/u).  ~  last cath 2001 w/ PTCA to mid-LAD lesion w/ cutting balloon & brachytherapy for in-stent restenosis.  ~  last hosp 6/08 w/ CP- felt to be non-cardiac...  ~  last 2DECho 6/08 showed HK of inferoseptal area, EF=55%...  ~  last NuclearStressTest 7/08 w/ mod ant/apical/septal infarct, apical HK, no ischemia, EF=?53%...  VENOUS INSUFFICIENCY (ICD-459.81) - low sodium diet, elevation, & Lasix 40-80mg /d... states she "allergic" to TED hose! and refuses to wear them...   HYPERLIPIDEMIA (ICD-272.4) - on ZOCOR 80mg /d + FISH OIL 1000mg /d ... she has been counselled on diet & exercise but unsuccessful...  ~  FLP 11/08 on Simva40 showed TChol 150, TG 185, HDL 35, LDL 78  ~  FLP 3/09 on Simva40 showed TChol 139, TG 193, HDL 30, LDL 70... DrMcDowell incr to Simva80 9/09.  ~  FLP 2/10 on Simva80 showed TChol 155, TG 137, HDL 43, LDL 85  ~  FLP 10/10 showed TChol 129, TG 96, HDL 39, LDL 71  ~  FLP 8/11 showed TChol 130, TG 152, HDL 30, LDL 70  DM (ICD-250.00) - on HUMULIN 70/30- taking 25uAM & 20uPM, GLUCOPHAGE 500mg Bid, AVANDIA 4mg Bid...  ~  BS=142 & HgA1c=6.6 in Nov08...  ~  Labs 3/09 showed BS= 112, HgA1c= 6.8  ~  labs 7/09 showed BS= 197, HgA1c= 7.1.Marland KitchenMarland Kitchen rec- better diet!  ~  labs 2/10 showed BS= 134, A1c= 7.0  ~  labs 10/10 showed BS= 151, A1c= 6.9  ~  labs 2/11 showed BS= 239, A1c= 8.1.Marland KitchenMarland Kitchen pt instructed to up titrate insulin til BS= 120-150 range.  ~  labs 8/11 showed BS= 215, A1c= 8.4... again instructed to incr insulin slowly & titrate to BS values.  OBESITY (ICD-278.00) - we discussed diet + exercise program required to lose weight!  ~  weight 2008 = 235#  ~  weight 2/10 = 250#  ~  weight 10/10 = 258#  ~  weight 2/11 = 241#  ~  weight 8/11 = 217#  GERD (ICD-530.81) - on PPI therapy... changed 2/10 per request to OMEPRAZOLE 20mg /d...  CONSTIPATION (ICD-564.00) - last colonoscopy was 11/07 by DrJacobs and negative...  DEGENERATIVE JOINT DISEASE (ICD-715.90) - prev CSpine films w/  spondy and biforaminal narrowing... LSSpine films w/ DDD and osteopenia... she saw DrDrake, Ortho in [place], for shot in right foot  OSTEOPOROSIS (ICD-733.00) - on BONIVA 150mg /mo, Calcium, Vits... she needs a BMD...  ~  labs 2/11 showed Vit D level = 27... OK to change to 1000 u daily.  ANXIETY (ICD-300.00) - uses Alprazolam 0.5mg  as needed...  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  ~  labs 10/10 showed Hg= 11.9, Fe= 50... rec OTC Fe supplement.  ~  labs 2/11 showed Hg= 13.4, Fe= 52 (14%sat)  Health Maintenance - she hasn't seen GYN in yrs and is asked to re-establish... prev refused Mammograms due to the discomfort & bruising- she will discuss w/ Gyn & get a follow up BMD as well.Marland KitchenMarland Kitchen  Preventive Screening-Counseling & Management  Alcohol-Tobacco     Smoking Status: quit     Year Quit: 2000  Allergies: 1)  ! Codeine 2)  ! Brethine (Terbutaline Sulfate)  Comments:  Nurse/Medical Assistant: The patient's medications and allergies were reviewed with the patient and were updated in the Medication and Allergy Lists.  Past History:  Past Medical History: COPD (ICD-496) ESSENTIAL HYPERTENSION, BENIGN (ICD-401.1) CORONARY ATHEROSCLEROSIS NATIVE CORONARY ARTERY (ICD-414.01)    CAD - AMI 2000, PTCA/BMS LAD 2000, PTCA/brachytherapy LAD 2001, LVEF 55% VENOUS INSUFFICIENCY (ICD-459.81) HYPERLIPIDEMIA (ICD-272.4) DM (ICD-250.00) OBESITY (ICD-278.00) GERD (ICD-530.81) CONSTIPATION (ICD-564.00) DEGENERATIVE JOINT DISEASE (ICD-715.90) OSTEOPOROSIS (ICD-733.00) ANXIETY (ICD-300.00) ANEMIA, MILD (ICD-285.9)  Past Surgical History: Hysterectomy Cholecystectomy  Family History: Reviewed history from 04/06/2009 and no changes required. Family History of Coronary Artery Disease  Social History: Reviewed history from 04/06/2009 and no changes required. Widowed  Tobacco Use - Former, quit 2000 Alcohol Use - no Drug Use - no  Review of Systems      See  HPI       The patient complains of dyspnea on exertion and difficulty walking.  The patient denies anorexia, fever, weight loss, weight gain, vision loss, decreased hearing, hoarseness, chest pain, syncope, peripheral edema, prolonged cough, headaches, hemoptysis, abdominal pain, melena, hematochezia, severe indigestion/heartburn, hematuria, incontinence, muscle weakness, suspicious skin lesions, transient blindness, depression, unusual weight change, abnormal bleeding, enlarged lymph nodes, and angioedema.    Vital Signs:  Patient profile:   70 year old female Height:      64.5 inches Weight:      217 pounds BMI:     36.81 O2 Sat:      95 % on 2 L/min Temp:     98.5 degrees F oral Pulse rate:   72 / minute BP sitting:   128 / 74  (left arm) Cuff size:   regular  Vitals Entered By: Randell Loop CMA (October 24, 2009 10:04 AM)  O2 Sat at Rest %:  95 O2 Flow:  2 L/min CC: 6 month ROV & review of mult medical problems... Is Patient Diabetic? Yes Pain Assessment Patient in pain? no      Comments MEDS UPDATED TODAY WITH PT   Physical Exam  Additional Exam:  WD, Obese, 70 y/o WF in NAD... GENERAL:  Alert & oriented; pleasant & cooperative...  HEENT:  [place]/AT, EOM-wnl, PERRLA, EACs-clear, TMs-wnl, NOSE-clear, THROAT-clear & wnl. NECK:  Supple w/ fairROM; no JVD; normal carotid impulses w/o bruits; no thyromegaly or nodules palpated; no lymphadenopathy. CHEST:  Decr BS bilat, few scat rhonchi, without wheezing, rales, or rubs...upper airway psuedo-wheezing HEART:  Regular Rhythm;  gr 1/6 SEM without rubs or gallops detected... ABDOMEN:  Obese, soft & nontender; normal bowel sounds; no organomegaly or masses detected. EXT: without deformities, mild arthritic changes; no varicose veins/ +venous insuffic/ tr edema. NEURO:  CN's intact; no focal neuro deficits... DERM:  no lesion seen...    MISC. Report  Procedure date:  10/24/2009  Findings:      BMP (METABOL)   Sodium                     141 mEq/L                   135-145   Potassium                 4.5 mEq/L  3.5-5.1   Chloride                  101 mEq/L                   96-112   Carbon Dioxide            32 mEq/L                    19-32   Glucose              [H]  215 mg/dL                   59-56   BUN                       19 mg/dL                    3-87   Creatinine           [H]  1.3 mg/dL                   5.6-4.3   Calcium                   9.1 mg/dL                   3.2-95.1   GFR                       44.32 mL/min                >60  Hemoglobin A1C (A1C)   Hemoglobin A1C       [H]  8.4 %                       4.6-6.5  Lipid Panel (LIPID)   Cholesterol               130 mg/dL                   8-841   Triglycerides        [H]  152.0 mg/dL                 6.6-063.0   HDL                  [L]  16.01 mg/dL                 >09.32   LDL Cholesterol           70 mg/dL                    3-55  TSH (TSH)   FastTSH                   0.83 uIU/mL                 0.35-5.50   Impression & Recommendations:  Problem # 1:  COPD (ICD-496) Continue regular meds... Her updated medication list for this problem includes:    Albuterol Sulfate (2.5 Mg/43ml) 0.083% Nebu (Albuterol sulfate) ..... Inhale 1 vial via hhn four times a day    Ipratropium Bromide 0.02 % Soln (Ipratropium bromide) ..... Inhale 1 vial via hhn four times a day    Pulmicort 0.25 Mg/39ml Susp (Budesonide) ..... Use in neb two times a day  Problem # 2:  ESSENTIAL HYPERTENSION, BENIGN (ICD-401.1) Controlled>  same meds. Her updated medication list for this problem includes:    Atenolol 50 Mg Tabs (Atenolol) .Marland Kitchen... Take 1 by mouth once daily    Lisinopril 20 Mg Tabs (Lisinopril) .Marland Kitchen... Take 1 tablet by mouth once daily    Furosemide 40 Mg Tabs (Furosemide) .Marland Kitchen... Take 1 by mouth once daily, may use 1 extra daily as needed swelling  Orders: TLB-BMP (Basic Metabolic Panel-BMET) (80048-METABOL) TLB-A1C / Hgb A1C (Glycohemoglobin)  (83036-A1C) TLB-Lipid Panel (80061-LIPID) TLB-TSH (Thyroid Stimulating Hormone) (84443-TSH)  Problem # 3:  CORONARY ATHEROSCLEROSIS NATIVE CORONARY ARTERY (ICD-414.01) Followed by DrMcDowell... Her updated medication list for this problem includes:    Aspirin 81 Mg Tbec (Aspirin) .Marland Kitchen... Take 1 by mouth once daily    Nitrostat 0.3 Mg Subl (Nitroglycerin) .Marland Kitchen... As needed as directed    Atenolol 50 Mg Tabs (Atenolol) .Marland Kitchen... Take 1 by mouth once daily    Lisinopril 20 Mg Tabs (Lisinopril) .Marland Kitchen... Take 1 tablet by mouth once daily    Furosemide 40 Mg Tabs (Furosemide) .Marland Kitchen... Take 1 by mouth once daily, may use 1 extra daily as needed swelling  Problem # 4:  HYPERLIPIDEMIA (ICD-272.4) FLP improved, tolerating med well>  continue same for now & diet Rx w/ weight reduction. Her updated medication list for this problem includes:    Simvastatin 80 Mg Tabs (Simvastatin) .Marland Kitchen... Take 1 tab by mouth at bedtime. please call office for an appt.  Problem # 5:  DM (ICD-250.00) Still not at goal despite diet/ exercise/ wt reduction & her current meds... Discussed need to titrate insulin dose up slowly until BS in the 120-150 range... Her updated medication list for this problem includes:    Aspirin 81 Mg Tbec (Aspirin) .Marland Kitchen... Take 1 by mouth once daily    Lisinopril 20 Mg Tabs (Lisinopril) .Marland Kitchen... Take 1 tablet by mouth once daily    Humulin 70/30 70-30 % Susp (Insulin isophane & regular) ..... Inject 25  units subcutaneously in the AM and 20  units PM.    Glucophage 500 Mg Tabs (Metformin hcl) .Marland Kitchen... Take 1 by mouth two times a day  Problem # 6:  OBESITY (ICD-278.00) Weight down to 217#... keep up the good work...  Problem # 7:  GERD (ICD-530.81) GI is stable>  same Rx. Her updated medication list for this problem includes:    Omeprazole 20 Mg Tbec (Omeprazole) .Marland Kitchen... Take 1 tab by mouth once daily...  Problem # 8:  OSTEOPOROSIS (ICD-733.00) Ortho stable>  same meds + exercise... Her updated medication list  for this problem includes:    Boniva 150 Mg Tabs (Ibandronate sodium) .Marland Kitchen... Take one tablet by mouth every month  Complete Medication List: 1)  Flonase 50 Mcg/act Susp (Fluticasone propionate) .... Use 2 puffs in each nostril twice daily.Marland KitchenMarland Kitchen 2)  Albuterol Sulfate (2.5 Mg/76ml) 0.083% Nebu (Albuterol sulfate) .... Inhale 1 vial via hhn four times a day 3)  Ipratropium Bromide 0.02 % Soln (Ipratropium bromide) .... Inhale 1 vial via hhn four times a day 4)  Pulmicort 0.25 Mg/26ml Susp (Budesonide) .... Use in neb two times a day 5)  Mucinex 600 Mg Tb12 (Guaifenesin) .... Take 2 by mouth two times a day w/ plenty of fluids.Marland KitchenMarland Kitchen 6)  Aspirin 81 Mg Tbec (Aspirin) .... Take 1 by mouth once daily 7)  Nitrostat 0.3 Mg Subl (Nitroglycerin) .... As needed as directed 8)  Atenolol 50 Mg Tabs (Atenolol) .... Take 1 by mouth once daily 9)  Lisinopril 20 Mg Tabs (Lisinopril) .... Take 1 tablet by mouth once daily 10)  Furosemide 40 Mg Tabs (Furosemide) .... Take 1 by mouth once daily, may use 1 extra daily as needed swelling 11)  Simvastatin 80 Mg Tabs (Simvastatin) .... Take 1 tab by mouth at bedtime. please call office for an appt. 12)  Fish Oil 1000 Mg Caps (Omega-3 fatty acids) .... Take 1 tablet by mouth once a day 13)  Humulin 70/30 70-30 % Susp (Insulin isophane & regular) .... Inject 25  units subcutaneously in the morning and 20  units subcutaneously in the evening 14)  Glucophage 500 Mg Tabs (Metformin hcl) .... Take 1 by mouth two times a day 15)  Omeprazole 20 Mg Tbec (Omeprazole) .... Take 1 tab by mouth once daily... 16)  Etodolac 400 Mg Tabs (Etodolac) .... Take 1 by mouth every 12 hours 17)  Boniva 150 Mg Tabs (Ibandronate sodium) .... Take one tablet by mouth every month 18)  Oscal 500/200 D-3 500-200 Mg-unit Tabs (Calcium-vitamin d) .... Take one by mouth once daily 19)  Vitamin D 1000 Unit Tabs (Cholecalciferol) .... Take 1 cap daily... 20)  Gabapentin 100 Mg Caps (Gabapentin) .... Take 1 by  mouth at bedtime for 10d, then 2 at bedtime for 10d, then 3 at bedtime thereafter... 21)  Alprazolam 0.5 Mg Tabs (Alprazolam) .... Take 1/2 -1 tablet by mouth three times a day as needed 22)  Iron 325 (65 Fe) Mg Tabs (Ferrous sulfate) .... Take 1 by mouth once daily 23)  B Complex-vitamin B12 Tabs (B complex-folic acid) .... Take 1 tablet by mouth once a day 24)  Onetouch Ultra Test Strp (Glucose blood) .... Test blood sugar two times a day  Patient Instructions: 1)  Today we updated your med list- see below.... 2)  We wrote a new perscription for your Insulin today... 3)  Today we did your FASTING blood work... please call the "phone tree" in a few days for your lab results.Marland KitchenMarland Kitchen 4)  These results will be avail to DrMcDowell at your follow up appt w/ him... 5)  Keep up the great job w/ weight reduction!!! 6)  Call for any problems.Marland KitchenMarland Kitchen 7)  Please schedule a follow-up appointment in 6 months. Prescriptions: HUMULIN 70/30 70-30 %  SUSP (INSULIN ISOPHANE & REGULAR) inject 25  units subcutaneously in the morning and 20  units subcutaneously in the evening  #2 bottles... x prn   Entered and Authorized by:   Michele Mcalpine MD   Signed by:   Michele Mcalpine MD on 10/24/2009   Method used:   Print then Give to Patient   RxID:   [id number]

## 2010-04-06 NOTE — Assessment & Plan Note (Signed)
Summary: 2 wk nurse visit and BP check per checkout on 11/23/09/tg  Nurse Visit   Vital Signs:  Patient profile:   70 year old female Height:      64.5 inches Weight:      213 pounds O2 Sat:      94 % on Room air Temp:     97.3 degrees F oral Pulse rate:   59 / minute BP sitting:   165 / 74  (left arm)  Vitals Entered By: Teressa Lower RN (December 07, 2009 8:41 AM)  O2 Flow:  Room air  Impression & Recommendations:  Problem # 1:  ESSENTIAL HYPERTENSION, BENIGN (ICD-401.1)  Not well-controlled. Home blood pressure checks show systolics ranging from 140s to 170s in general. Heart rate well controlled. Suggest increase lisinopril to 20 mg p.o. b.i.d., continue other medications. She will need a BMET in 2 weeks.  Her updated medication list for this problem includes:    Aspirin 81 Mg Tbec (Aspirin) .Marland Kitchen... Take 1 by mouth once daily    Atenolol 50 Mg Tabs (Atenolol) .Marland Kitchen... Take 1 by mouth once daily    Lisinopril 20 Mg Tabs (Lisinopril) .Marland Kitchen... Take 1 tablet by mouth two times a day    Furosemide 40 Mg Tabs (Furosemide) .Marland Kitchen... Take 1 by mouth once daily, may use 1 extra daily as needed swelling  Future Orders: T-Basic Metabolic Panel [id number]) ... 12/21/2009   Visit Type:  2 week nurse visit Primary Jaggar Benko:  Dr. Alroy Dust   History of Present Illness: S: 2 week nurse visit B: ov on 11/22/09, no change in medications, monitor bp  A: denies complaints, bp diary scanned in record, trying to lose wt, 3 lbs wt loss  R:handouts on low cal diet, congratulated pt on wt loss, bp remains elevated    Current Medications (verified): 1)  Flonase 50 Mcg/act  Susp (Fluticasone Propionate) .... Use 2 Puffs in Each Nostril Twice Daily.Marland KitchenMarland Kitchen 2)  Albuterol Sulfate (2.5 Mg/57ml) 0.083% Nebu (Albuterol Sulfate) .... Inhale 1 Vial Via Hhn Four Times A Day 3)  Ipratropium Bromide 0.02 % Soln (Ipratropium Bromide) .... Inhale 1 Vial Via Hhn Four Times A Day 4)  Pulmicort 0.25 Mg/85ml  Susp  (Budesonide) .... Use in Neb Two Times A Day 5)  Mucinex 600 Mg  Tb12 (Guaifenesin) .... Take 2 By Mouth Two Times A Day W/ Plenty of Fluids.Marland KitchenMarland Kitchen 6)  Aspirin 81 Mg  Tbec (Aspirin) .... Take 1 By Mouth Once Daily 7)  Nitrostat 0.3 Mg  Subl (Nitroglycerin) .... As Needed As Directed 8)  Atenolol 50 Mg  Tabs (Atenolol) .... Take 1 By Mouth Once Daily 9)  Lisinopril 20 Mg Tabs (Lisinopril) .... Take 1 Tablet By Mouth Once Daily 10)  Furosemide 40 Mg  Tabs (Furosemide) .... Take 1 By Mouth Once Daily, May Use 1 Extra Daily As Needed Swelling 11)  Simvastatin 80 Mg Tabs (Simvastatin) .... Take 1 Tab By Mouth At Bedtime. Please Call Office For An Appt. 12)  Fish Oil 1000 Mg Caps (Omega-3 Fatty Acids) .... Take 1 Tablet By Mouth Once A Day 13)  Humulin 70/30 70-30 %  Susp (Insulin Isophane & Regular) .... Inject 25  Units Subcutaneously in The Morning and 20  Units Subcutaneously in The Evening 14)  Glucophage 500 Mg  Tabs (Metformin Hcl) .... Take 1 By Mouth Two Times A Day 15)  Omeprazole 20 Mg Tbec (Omeprazole) .... Take 1 Tab By Mouth Once Daily... 16)  Etodolac 400 Mg  Tabs (  Etodolac) .... Take 1 By Mouth Every 12 Hours 17)  Boniva 150 Mg  Tabs (Ibandronate Sodium) .... Take One Tablet By Mouth Every Month 18)  Oscal 500/200 D-3 500-200 Mg-Unit  Tabs (Calcium-Vitamin D) .... Take One By Mouth Once Daily 19)  Vitamin D 1000 Unit Tabs (Cholecalciferol) .... Take 1 Cap Daily... 20)  Gabapentin 100 Mg Caps (Gabapentin) .... Take 1 By Mouth At Bedtime For 10d, Then 2 At Bedtime For 10d, Then 3 At Bedtime Thereafter... 21)  Alprazolam 0.5 Mg  Tabs (Alprazolam) .... Take 1/2 -1 Tablet By Mouth Three Times A Day As Needed 22)  Iron 325 (65 Fe) Mg  Tabs (Ferrous Sulfate) .... Take 1 By Mouth Once Daily 23)  B Complex-Vitamin B12  Tabs (B Complex-Folic Acid) .... Take 1 Tablet By Mouth Once A Day 24)  Onetouch Ultra Test   Strp (Glucose Blood) .... Test Blood Sugar Two Times A Day 25)   Hydrocodone-Acetaminophen 10-660 Mg Tabs (Hydrocodone-Acetaminophen) .... Take As Needed For Pain  Allergies (verified): 1)  ! Codeine 2)  ! Brethine  Orders Added: 1)  T-Basic Metabolic Panel [phone] [id number] Prescriptions: LISINOPRIL 20 MG TABS (LISINOPRIL) Take 1 tablet by mouth two times a day  #60 x 3   Entered by:   Teressa Lower RN   Authorized by:   Loreli Slot, MD, [facility]   Signed by:   Teressa Lower RN on 12/07/2009   Method used:   Electronically to        [facility] (retail)       [address]       [place], [place]  [address]       Ph: [phone]       Fax: [phone]   RxID:   [id number]  I spoke with pt, new medication instructions given, labs ordered, pt verbalized instructions  Teressa Lower RN  December 07, 2009 2:35 PM

## 2010-04-06 NOTE — Letter (Signed)
Summary: [facility] Nebulizer [facility] Med[facility]  [facility]ul[facility]   Imported By: Sherian Rein 04/21/2009 08:11:24  _____________________________________________________________________  External Attachment:    Type:   Image     Comment:   External Document

## 2010-04-06 NOTE — Miscellaneous (Signed)
Summary: labs lipids,bmp,tsh,12/08/2008  Clinical Lists Changes  Observations: Added new observation of CALCIUM: 934 mg/dL (04/54/0981 19:14) Added new observation of GFR: 52.45 mL/min (12/08/2008 10:41) Added new observation of CREATININE: 1.1 mg/dL (78/29/5621 30:86) Added new observation of BUN: 14 mg/dL (57/84/6962 95:28) Added new observation of BG RANDOM: 151 mg/dL (41/32/4401 02:72) Added new observation of CO2 PLSM/SER: 35 meq/L (12/08/2008 10:41) Added new observation of CL SERUM: 102 meq/L (12/08/2008 10:41) Added new observation of K SERUM: 4.6 meq/L (12/08/2008 10:41) Added new observation of NA: 144 meq/L (12/08/2008 10:41) Added new observation of LDL: 71 mg/dL (53/66/4403 47:42) Added new observation of HDL: 38.70 mg/dL (59/56/3875 64:33) Added new observation of TRIGLYC TOT: 96 mg/dL (29/51/8841 66:06) Added new observation of CHOLESTEROL: 129 mg/dL (30/16/0109 32:35)

## 2010-04-10 ENCOUNTER — Ambulatory Visit (INDEPENDENT_AMBULATORY_CARE_PROVIDER_SITE_OTHER): Payer: Medicare Other | Admitting: Adult Health

## 2010-04-10 ENCOUNTER — Encounter: Payer: Self-pay | Admitting: Adult Health

## 2010-04-10 ENCOUNTER — Ambulatory Visit (INDEPENDENT_AMBULATORY_CARE_PROVIDER_SITE_OTHER)
Admission: RE | Admit: 2010-04-10 | Discharge: 2010-04-10 | Disposition: A | Discharge status: Home / Self Care | Payer: Medicare Other | Source: Ambulatory Visit | Attending: Pulmonary Disease | Admitting: Pulmonary Disease

## 2010-04-10 ENCOUNTER — Other Ambulatory Visit: Payer: Self-pay | Admitting: Pulmonary Disease

## 2010-04-10 DIAGNOSIS — J449 Chronic obstructive pulmonary disease, unspecified: Secondary | ICD-10-CM

## 2010-04-10 DIAGNOSIS — J209 Acute bronchitis, unspecified: Secondary | ICD-10-CM

## 2010-04-20 NOTE — Assessment & Plan Note (Signed)
Summary: congestion ///kp   Primary Provider/Referring Provider:  Dr. Alroy Dust  CC:  c/o cough thick yellow, sob with exertion, and wheezing.  History of Present Illness:  70 year old female with known history of hyperlipidemia, DM, HTN, CAD.    April 10, 2010 --[place] for an acute office visit. Complains  cough thick yellow, sob with exertion, wheezing. Over last week cough and congestion are getting worse with wheezing. OTC not helping.  Cough is keeping her up at night. Feels tired and run down. Denies chest pain,  orthopnea, hemoptysis, fever, n/v/d, edema, headache.   Medications Prior to Update: 1)  Flonase 50 Mcg/act  Susp (Fluticasone Propionate) .... Use 2 Puffs in Each Nostril Twice Daily.Marland KitchenMarland Kitchen 2)  Albuterol Sulfate (2.5 Mg/47ml) 0.083% Nebu (Albuterol Sulfate) .... Inhale 1 Vial Via Hhn Four Times A Day 3)  Ipratropium Bromide 0.02 % Soln (Ipratropium Bromide) .... Inhale 1 Vial Via Hhn Four Times A Day 4)  Pulmicort 0.25 Mg/3ml  Susp (Budesonide) .... Use in Neb Two Times A Day 5)  Mucinex 600 Mg  Tb12 (Guaifenesin) .... Take 2 By Mouth Two Times A Day W/ Plenty of Fluids.Marland KitchenMarland Kitchen 6)  Aspirin 81 Mg  Tbec (Aspirin) .... Take 1 By Mouth Once Daily 7)  Nitrostat 0.3 Mg  Subl (Nitroglycerin) .... As Needed As Directed 8)  Atenolol 50 Mg  Tabs (Atenolol) .... Take 1 By Mouth Once Daily 9)  Lisinopril 20 Mg Tabs (Lisinopril) .... Take 1 Tablet By Mouth Two Times A Day 10)  Furosemide 40 Mg  Tabs (Furosemide) .... Take 1 By Mouth Once Daily, May Use 1 Extra Daily As Needed Swelling 11)  Simvastatin 80 Mg Tabs (Simvastatin) .... Take 1 Tab By Mouth At Bedtime. 12)  Fish Oil 1000 Mg Caps (Omega-3 Fatty Acids) .... Take 1 Tablet By Mouth Once A Day 13)  Humulin 70/30 70-30 %  Susp (Insulin Isophane & Regular) .... Inject 25  Units Subcutaneously in The Morning and 20  Units Subcutaneously in The Evening 14)  Glucophage 500 Mg  Tabs (Metformin Hcl) .... Take 1 By Mouth Two Times A Day 15)   Omeprazole 20 Mg Tbec (Omeprazole) .... Take 1 Tab By Mouth Once Daily... 16)  Etodolac 400 Mg  Tabs (Etodolac) .... Take 1 By Mouth Every 12 Hours 17)  Boniva 150 Mg  Tabs (Ibandronate Sodium) .... Take One Tablet By Mouth Every Month 18)  Oscal 500/200 D-3 500-200 Mg-Unit  Tabs (Calcium-Vitamin D) .... Take One By Mouth Once Daily 19)  Vitamin D 1000 Unit Tabs (Cholecalciferol) .... Take 1 Cap Daily... 20)  Gabapentin 100 Mg Caps (Gabapentin) .... Take 1 By Mouth At Bedtime For 10d, Then 2 At Bedtime For 10d, Then 3 At Bedtime Thereafter... 21)  Alprazolam 0.5 Mg  Tabs (Alprazolam) .... Take 1/2 -1 Tablet By Mouth Three Times A Day As Needed 22)  Iron 325 (65 Fe) Mg  Tabs (Ferrous Sulfate) .... Take 1 By Mouth Once Daily 23)  B Complex-Vitamin B12  Tabs (B Complex-Folic Acid) .... Take 1 Tablet By Mouth Once A Day 24)  Onetouch Ultra Test   Strp (Glucose Blood) .... Test Blood Sugar Two Times A Day 25)  Hydrocodone-Acetaminophen 10-660 Mg Tabs (Hydrocodone-Acetaminophen) .... Take As Needed For Pain 26)  Augmentin 875-125 Mg Tabs (Amoxicillin-Pot Clavulanate) .Marland Kitchen.. 1 By Mouth Twice A Day  Allergies: 1)  ! Codeine 2)  ! Brethine  Past History:  Past Medical History: Last updated: 11/23/2009 Anemia Anxiety Arthritis CAD -  PTCA/BMS LAD 2000, PTCA/brachytherapy LAD 2001, LVEF 55% C O P D Diabetes Type 2 G E R D Hyperlipidemia Hypertension Osteoporosis Venous insufficiency Obesity Myocardial Infarction - AMI 2000  Past Surgical History: Last updated: 10/24/2009 Hysterectomy Cholecystectomy  Family History: Last updated: 04/06/2009 Family History of Coronary Artery Disease  Social History: Last updated: 04/06/2009 Widowed  Tobacco Use - Former, quit 2000 Alcohol Use - no Drug Use - no  Review of Systems      See HPI  Vital Signs:  Patient profile:   70 year old female Height:      64.5 inches Weight:      215.13 pounds BMI:     36.49 O2 Sat:      93 % Temp:      97.9 degrees F oral Pulse rate:   61 / minute BP sitting:   140 / 70  (right arm) Cuff size:   large  Vitals Entered By: Kandice Hams CMA (April 10, 2010 5:06 PM) CC: c/o cough thick yellow, sob with exertion, wheezing   Physical Exam  Additional Exam:  Overweight woman in no acute distress. HEENT: Conjunctiva and lids normal, oropharynx with moist mucosa. Neck: Supple, no elevated JVP or carotid bruits. Lungs: Coarse BS w/ faint exp wheeze  Cardiac: Distant regular heart sounds, soft systolic murmur at the base, no S3. Abdomen: Obese, nontender, no organomegaly, bowel sounds present. Skin: Warm and dry. Extremities: Trace ankle edema.  Musculoskeletal: No kyphosis. Neuropsychiatric: Alert and oriented x3, affect appropriate.   Impression & Recommendations:  Problem # 1:  COPD (ICD-496)  Exacerbation  xray pending.  Depo Medrol 120mg  IM injection plan;  Avelox 400mg  once daily for 7days i will call with xray resutls. Mucinex DM two times a day as needed cough/congestion Hold fish oil for now Hydromet 1/2-1 tsp every4-6hrs as needed cough, may make you sleepy. Please contact office for sooner follow up if symptoms do not improve or worsen  follow up in 2 weeks Dr. Kriste Basque  Her updated medication list for this problem includes:    Albuterol Sulfate (2.5 Mg/42ml) 0.083% Nebu (Albuterol sulfate) ..... Inhale 1 vial via hhn four times a day    Ipratropium Bromide 0.02 % Soln (Ipratropium bromide) ..... Inhale 1 vial via hhn four times a day    Pulmicort 0.25 Mg/4ml Susp (Budesonide) ..... Use in neb two times a day    Mucinex 600 Mg Tb12 (Guaifenesin) .Marland Kitchen... Take 2 by mouth two times a day w/ plenty of fluids...    Augmentin 875-125 Mg Tabs (Amoxicillin-pot clavulanate) .Marland Kitchen... 1 by mouth twice a day    Avelox 400 Mg Tabs (Moxifloxacin hcl) .Marland Kitchen... 1 by mouth once daily    Hydromet 5-1.5 Mg/55ml Syrp (Hydrocodone-homatropine) .Marland Kitchen... Take 1-2 teaspoons every 4-6 hours as needed cough  - may make you sleepy  Orders: T-2 View CXR (71020TC) Depo- Medrol 80mg  (J1040) Depo- Medrol 40mg  (J1030) Admin of Therapeutic Inj  intramuscular or subcutaneous (14782) Est. Patient Level IV (95621)  Medications Added to Medication List This Visit: 1)  Avelox 400 Mg Tabs (Moxifloxacin hcl) .Marland Kitchen.. 1 by mouth once daily 2)  Hydromet 5-1.5 Mg/85ml Syrp (Hydrocodone-homatropine) .... Take 1-2 teaspoons every 4-6 hours as needed cough - may make you sleepy  Patient Instructions: 1)  Avelox 400mg  once daily for 7days 2)  i will call with xray resutls. 3)  Mucinex DM two times a day as needed cough/congestion 4)  Hold fish oil for now 5)  Hydromet 1/2-1 tsp  every4-6hrs as needed cough, may make you sleepy. 6)  Please contact office for sooner follow up if symptoms do not improve or worsen  7)  follow up in 2 weeks Dr. Kriste Basque  Prescriptions: HYDROMET 5-1.5 MG/5ML SYRP (HYDROCODONE-HOMATROPINE) take 1-2 teaspoons every 4-6 hours as needed cough - may make you sleepy  #4oz x 0   Entered by:   Boone Master CNA/MA   Authorized by:   Rubye Oaks NP   Signed by:   Boone Master CNA/MA on 04/10/2010   Method used:   Print then Give to Patient   RxID:   [id number] AVELOX 400 MG TABS (MOXIFLOXACIN HCL) 1 by mouth once daily  #7 x 0   Entered and Authorized by:   Rubye Oaks NP   Signed by:   Kemora Pinard NP on 04/10/2010   Method used:   Electronically to        [facility]  [address] (retail)       [address]       [place], [place]  [address]       Ph: [phone]       Fax: [phone]   RxID:   [id number]    Medication Administration  Injection # 1:    Medication: Depo- Medrol 80mg     Diagnosis: COPD (ICD-496)    Route: IM    Site: LUOQ gluteus    Exp Date: 09/2012    Lot #: [id number]    Mfr: [employer]    Patient tolerated injection without complications    Given by: Carver Fila (April 10, 2010 4:47 PM)  Injection # 2:    Medication: Depo-  Medrol 40mg     Diagnosis: COPD (ICD-496)    Route: IM    Site: LUOQ gluteus    Exp Date: 09/2012    Lot #: [id number]    Mfr: [employer]    Patient tolerated injection without complications    Given by: Carver Fila (April 10, 2010 4:47 PM)  Orders Added: 1)  T-2 View CXR [71020TC] 2)  Depo- Medrol 80mg  [J1040] 3)  Depo- Medrol 40mg  [J1030] 4)  Admin of Therapeutic Inj  intramuscular or subcutaneous [96372] 5)  Est. Patient Level IV [64403]

## 2010-04-24 ENCOUNTER — Ambulatory Visit (INDEPENDENT_AMBULATORY_CARE_PROVIDER_SITE_OTHER): Payer: Medicare Other | Admitting: Pulmonary Disease

## 2010-04-24 ENCOUNTER — Encounter: Payer: Self-pay | Admitting: Pulmonary Disease

## 2010-04-24 ENCOUNTER — Other Ambulatory Visit: Payer: Self-pay | Admitting: Pulmonary Disease

## 2010-04-24 ENCOUNTER — Other Ambulatory Visit: Payer: Medicare Other

## 2010-04-24 DIAGNOSIS — M810 Age-related osteoporosis without current pathological fracture: Secondary | ICD-10-CM

## 2010-04-24 DIAGNOSIS — K219 Gastro-esophageal reflux disease without esophagitis: Secondary | ICD-10-CM

## 2010-04-24 DIAGNOSIS — I872 Venous insufficiency (chronic) (peripheral): Secondary | ICD-10-CM

## 2010-04-24 DIAGNOSIS — E669 Obesity, unspecified: Secondary | ICD-10-CM

## 2010-04-24 DIAGNOSIS — J449 Chronic obstructive pulmonary disease, unspecified: Secondary | ICD-10-CM

## 2010-04-24 DIAGNOSIS — J209 Acute bronchitis, unspecified: Secondary | ICD-10-CM

## 2010-04-24 DIAGNOSIS — E119 Type 2 diabetes mellitus without complications: Secondary | ICD-10-CM

## 2010-04-24 DIAGNOSIS — K5900 Constipation, unspecified: Secondary | ICD-10-CM

## 2010-04-24 DIAGNOSIS — M1990 Unspecified osteoarthritis, unspecified site: Secondary | ICD-10-CM

## 2010-04-24 DIAGNOSIS — F411 Generalized anxiety disorder: Secondary | ICD-10-CM

## 2010-04-24 DIAGNOSIS — I10 Essential (primary) hypertension: Secondary | ICD-10-CM

## 2010-04-24 DIAGNOSIS — I2510 Atherosclerotic heart disease of native coronary artery without angina pectoris: Secondary | ICD-10-CM

## 2010-04-24 DIAGNOSIS — E785 Hyperlipidemia, unspecified: Secondary | ICD-10-CM

## 2010-04-24 DIAGNOSIS — D649 Anemia, unspecified: Secondary | ICD-10-CM

## 2010-04-24 DIAGNOSIS — J0100 Acute maxillary sinusitis, unspecified: Secondary | ICD-10-CM

## 2010-04-24 LAB — LIPID PANEL
HDL: 40.1 mg/dL (ref >39.00)
Total CHOL/HDL Ratio: 3
VLDL: 19.2 mg/dL (ref 0.0–40.0)

## 2010-04-24 LAB — BASIC METABOLIC PANEL
CO2: 33 mEq/L — ABNORMAL HIGH (ref 19–32)
Calcium: 9.2 mg/dL (ref 8.4–10.5)
GFR: 50.64 mL/min — ABNORMAL LOW (ref >60.00)
Sodium: 141 mEq/L (ref 135–145)

## 2010-04-24 LAB — CBC WITH DIFFERENTIAL/PLATELET
Eosinophils Relative: 3.3 % (ref 0.0–5.0)
Lymphocytes Relative: 24.1 % (ref 12.0–46.0)
MCHC: 33.5 g/dL (ref 30.0–36.0)
Monocytes Relative: 8.7 % (ref 3.0–12.0)
Neutro Abs: 4.4 10*3/uL (ref 1.4–7.7)
Neutrophils Relative %: 63.5 % (ref 43.0–77.0)
Platelets: 201 10*3/uL (ref 150.0–400.0)
RDW: 12.4 % (ref 11.5–14.6)

## 2010-04-24 LAB — HEPATIC FUNCTION PANEL
Alkaline Phosphatase: 60 U/L (ref 39–117)
Total Protein: 6.3 g/dL (ref 6.0–8.3)

## 2010-04-24 LAB — TSH: TSH: 1.13 u[IU]/mL (ref 0.35–5.50)

## 2010-05-09 ENCOUNTER — Ambulatory Visit (INDEPENDENT_AMBULATORY_CARE_PROVIDER_SITE_OTHER): Payer: Medicare Other | Admitting: Cardiology

## 2010-05-09 ENCOUNTER — Encounter: Payer: Self-pay | Admitting: Cardiology

## 2010-05-09 DIAGNOSIS — I10 Essential (primary) hypertension: Secondary | ICD-10-CM

## 2010-05-09 DIAGNOSIS — E782 Mixed hyperlipidemia: Secondary | ICD-10-CM

## 2010-05-09 DIAGNOSIS — I2510 Atherosclerotic heart disease of native coronary artery without angina pectoris: Secondary | ICD-10-CM

## 2010-05-09 DIAGNOSIS — J449 Chronic obstructive pulmonary disease, unspecified: Secondary | ICD-10-CM

## 2010-05-10 ENCOUNTER — Encounter: Payer: Self-pay | Admitting: Cardiology

## 2010-05-11 NOTE — Assessment & Plan Note (Signed)
Summary: 6 month /pac   Primary Care Provider:  Dr. Alroy Dust  CC:  6 month ROV & review of mult medical problems....  History of Present Illness: 70 y/o WF here for a follow up visit... she has mult med problems including severe COPD; HBP; CAD w/prev MI & PTCA followed by DrMcDowell; VI; Hyperlipidemia; DM on insulin; Obesity; GERD/ constipation; DJD/ osteoporosis; Anxiety...   ~  October 24, 2009:  63mo ROV- BS are all over the place (up to 400, down to 100) & we discussed adjusting her 70/30 insulin accordingly (her weight is down to 217#)... under alot of stress w/ 58 y/o mother "driving me nuts"... she saw DrMcDowell 2/11- BP sl elev & Lisinopril incr to 20mg /d... currently on 70/30 insulin taking 25u AM & 20uPM> rec incr doses slowly & titrate to BS values (discussed w/ pt, again).   ~  April 24, 2010:  63mo ROV- she has only incr insulin marginally to 25-30u AM & 20-25u PM and todays lab shows FBS=236, A1c=8.8; **we discussed incr her 70/30 insulin to 35u AM & 30u PM & titrate up from there...     BP is controlled on Aten, Lisin, Lasix> 140/82 today & she needs to check more often at home;  she saw DrMcDowell 9/11> HBP/ CAD/ Lipids all stable & no changes made;  she has lost wt down to 215# from peak 258# in 2010 but no wt loss over the last 63mo & we reviewed diet + exercise perscription;  FLP looks good on Simva80 + Fish Oil & meds tol well so we won't make any changes in her regimen...    She is c/o cough, yellow sput, nasal congestion & sneezing, feeling weak & "woozy" w/ mult somatic complaints;  she saw TP 2/6 & given Avelox, Mucinex, Hydromet but has persistant symptoms; **we discussed sinus regimen w/ Saline nasal mist, Mucinex + Fluids, Flonase, plus a brief PredDosepak (next step= ENT referral)...    She also has right knee pain> known degen arthritis & followed by DrNorris for Ortho- given steroid shot 9/11 w/ some transient improvement;  she uses etodolac Prn & has Omep for  stomach;  finally asking for refill alprazolam due to anxiety...     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 Rx'd w/ MUCINEX, Nasal Saline, FLONASE...  2/11>> c/o sinusitis & AUGMENTIN perscribed.  COPD (ICD-496) - she has COPD and is an ex-smoker... controlled on NEBULIZER w/ Duoneb Qid + Budesonide Bid, MUCINEX, etc... she has end stage disease w/ severe dyspnea w/ ADLs and no change over the last yr...  ~  baseline CXR = COPD, incr heart size, NAD.  ~ CT Chest 2/02 showed COPD, Atx, sl scarring, NAD.  ~  bronchoscopy 3/02 w/ mucous plugging, no endobr lesions.  ~  PFT's in 12/07 showed FVC=0.81 (27%), FEV1=0.54 (22%), %1sec=67, mid-flows=13%pred.  ~  CXR 1/10 showed chr changes, cardiomeg, lingular atx.  ~  CXR 2/11 showed cardiomeg, bibasilar scarring, NAD.  ~  CXR 2/12 showed cardiomeg, COPD,scarring left base, NAD.  HYPERTENSION (ICD-401.9) - controlled on ATENOLOL 50mg /d, LISINOPRIL 20mg /d, LASIX 40mg /d... BP=140/82 and tol well... denies HA, visual changes, CP, palipit, dizziness, syncope, change in dyspnea, edema, etc.  ~  Jan10: tried to change ACE to Diovan but too $$$ & requested change back to Lisinopril.  CORONARY ARTERY DISEASE (ICD-414.00) - on ASA 81mg /d... hx of CAD w/ prev MI in 2000, and subseq  PTCA's in 2000, & 2001... [facility] & last seen 9/09- note reviewed (she will call for f/u).  ~  last cath 2001 w/ PTCA to mid-LAD lesion w/ cutting balloon & brachytherapy for in-stent restenosis.  ~  last hosp 6/08 w/ CP- felt to be non-cardiac...  ~  last 2DECho 6/08 showed HK of inferoseptal area, EF=55%...  ~  last NuclearStressTest 7/08 w/ mod ant/apical/septal infarct, apical HK, no ischemia, EF=?53%...  VENOUS INSUFFICIENCY (ICD-459.81) - low sodium diet, elevation, & Lasix 40-80mg /d... states she "allergic" to TED hose! and refuses to wear them...    HYPERLIPIDEMIA (ICD-272.4) - on ZOCOR 80mg /d + FISH OIL 1000mg /d ... she has been counselled on diet & exercise but unsuccessful...  ~  FLP 11/08 on Simva40 showed TChol 150, TG 185, HDL 35, LDL 78  ~  FLP 3/09 on Simva40 showed TChol 139, TG 193, HDL 30, LDL 70... DrMcDowell incr to Simva80 9/09.  ~  FLP 2/10 on Simva80 showed TChol 155, TG 137, HDL 43, LDL 85  ~  FLP 10/10 showed TChol 129, TG 96, HDL 39, LDL 71  ~  FLP 8/11 showed TChol 130, TG 152, HDL 30, LDL 70  ~  FLP 2/12 showed TChol 139, TG 96, HDL 40, LDL 80  DM (ICD-250.00) - on HUMULIN 70/30- taking 25uAM & 20uPM, GLUCOPHAGE 500mg Bid, AVANDIA 4mg Bid...  ~  BS=142 & HgA1c=6.6 in Nov08...  ~  Labs 3/09 showed BS= 112, HgA1c= 6.8  ~  labs 7/09 showed BS= 197, HgA1c= 7.1.Marland KitchenMarland Kitchen rec- better diet!  ~  labs 2/10 showed BS= 134, A1c= 7.0  ~  labs 10/10 showed BS= 151, A1c= 6.9  ~  labs 2/11 showed BS= 239, A1c= 8.1.Marland KitchenMarland Kitchen pt instructed to up titrate insulin til BS= 120-150 range.  ~  labs 8/11 showed BS= 215, A1c= 8.4... again instructed to incr insulin slowly & titrate to BS values.  ~  labs 2/12 showed BS= 236, A1c= 8.8.Marland KitchenMarland Kitchen rec to incr 70/30 insulin incrementally...  OBESITY (ICD-278.00) - we discussed diet + exercise program required to lose weight!  ~  weight 2008 = 235#  ~  weight 2/10 = 250#  ~  weight 10/10 = 258#  ~  weight 2/11 = 241#  ~  weight 8/11 = 217#  ~  weight 2/12 = 215#  GERD (ICD-530.81) - on PPI therapy... changed 2/10 per request to OMEPRAZOLE 20mg /d...  CONSTIPATION (ICD-564.00) - last colonoscopy was 11/07 by DrJacobs and negative...  DEGENERATIVE JOINT DISEASE (ICD-715.90) - prev CSpine films w/ spondy and biforaminal narrowing... LSSpine films w/ DDD and osteopenia... she saw DrDrake, Ortho in [place], for shot in right foot  ~  9/11:  she saw DrNorris for shot in right knee  OSTEOPOROSIS (ICD-733.00) - on BONIVA 150mg /mo, Calcium, Vits... she needs a BMD...  ~  labs 2/11 showed Vit D level = 27... OK to  change to 1000 u daily.  ~  labs 2/12 showed Vit D level = 37... rec to continue 1000u supplement  ANXIETY (ICD-300.00) - uses Alprazolam 0.5mg  as needed...  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  ~  labs 10/10 showed Hg= 11.9, Fe= 50... rec OTC Fe supplement.  ~  labs 2/11 showed Hg= 13.4, Fe= 52 (14%sat)  ~  labs 2/12 showed Hg= 13.1, MCV= 93  Health Maintenance - she hasn't seen GYN in yrs and is asked to re-establish... prev refused Mammograms due to the discomfort &  bruising- she will discuss w/ Gyn & get a follow up BMD as well...   Preventive Screening-Counseling & Management  Alcohol-Tobacco     Smoking Status: quit     Year Quit: 2000  Allergies: 1)  ! Codeine 2)  ! Brethine  Comments:  Nurse/Medical Assistant: The patient's medications and allergies were reviewed with the patient and were updated in the Medication and Allergy Lists.  Past History:  Past Medical History: Anemia Anxiety Arthritis CAD - PTCA/BMS LAD 2000, PTCA/brachytherapy LAD 2001, LVEF 55% C O P D Diabetes Type 2 G E R D Hyperlipidemia Hypertension Osteoporosis Venous insufficiency Obesity Myocardial Infarction - AMI 2000  Past Surgical History: Hysterectomy Cholecystectomy  Family History: Reviewed history from 04/06/2009 and no changes required. Family History of Coronary Artery Disease  Social History: Reviewed history from 04/06/2009 and no changes required. Widowed  Tobacco Use - Former, quit 2000 Alcohol Use - no Drug Use - no  Review of Systems      See HPI       The patient complains of dyspnea on exertion, peripheral edema, muscle weakness, and difficulty walking.  The patient denies anorexia, fever, weight loss, weight gain, vision loss, decreased hearing, hoarseness, chest pain, syncope, prolonged cough, headaches, hemoptysis, abdominal pain, melena, hematochezia, severe indigestion/heartburn, hematuria, incontinence, suspicious skin  lesions, transient blindness, depression, unusual weight change, abnormal bleeding, enlarged lymph nodes, and angioedema.    Vital Signs:  Patient profile:   70 year old female Height:      64.5 inches Weight:      214.50 pounds BMI:     36.38 O2 Sat:      93 % on Room air Temp:     97.1 degrees F oral Pulse rate:   65 / minute BP sitting:   140 / 82  (left arm) Cuff size:   regular  Vitals Entered By: Randell Loop CMA (April 24, 2010 10:19 AM)  O2 Sat at Rest %:  93 O2 Flow:  Room air CC: 6 month ROV & review of mult medical problems... Is Patient Diabetic? Yes Pain Assessment Patient in pain? no      Comments meds updated today with pt   Physical Exam  Additional Exam:  WD, Obese, 70 y/o WF in NAD... GENERAL:  Alert & oriented; pleasant & cooperative...  HEENT:  [place]/AT, EOM-wnl, PERRLA, EACs-clear, TMs-wnl, NOSE-clear, THROAT-clear & wnl. NECK:  Supple w/ fairROM; no JVD; normal carotid impulses w/o bruits; no thyromegaly or nodules palpated; no lymphadenopathy. CHEST:  Decr BS bilat, few scat rhonchi, without wheezing, rales, or rubs...upper airway psuedo-wheezing HEART:  Regular Rhythm;  gr 1/6 SEM without rubs or gallops detected... ABDOMEN:  Obese, soft & nontender; normal bowel sounds; no organomegaly or masses detected. EXT: without deformities, mild arthritic changes; no varicose veins/ +venous insuffic/ tr edema. NEURO:  CN's intact; no focal neuro deficits... DERM:  no lesion seen...    MISC. Report  Procedure date:  04/24/2010  Findings:      FASTING labs >> reviewed & pt notified...   Impression & Recommendations:  Problem # 1:  COPD (ICD-496) She has severe COPD w/ URI/ bronchitic exac> s/p avelox etc & we discussed rx w/ Nasal saline mist, Mucinex 4/d w/ Fluids, Flonase Qhs, & PredDosepak... she will continue regular dosing Nebs etc... Her updated medication list for this problem includes:    Albuterol Sulfate (2.5 Mg/31ml) 0.083% Nebu  (Albuterol sulfate) ..... Inhale 1 vial via hhn four times a day  Ipratropium Bromide 0.02 % Soln (Ipratropium bromide) ..... Inhale 1 vial via hhn four times a day    Pulmicort 0.25 Mg/43ml Susp (Budesonide) ..... Use in neb two times a day  Problem # 2:  ESSENTIAL HYPERTENSION, BENIGN (ICD-401.1) Controlled>  continue same meds. Her updated medication list for this problem includes:    Atenolol 50 Mg Tabs (Atenolol) .Marland Kitchen... Take 1 by mouth once daily    Lisinopril 20 Mg Tabs (Lisinopril) .Marland Kitchen... Take 1 tablet by mouth two times a day    Furosemide 40 Mg Tabs (Furosemide) .Marland Kitchen... Take 1 by mouth once daily, may use 1 extra daily as needed swelling  Orders: T-Vitamin D (25-Hydroxy) (16109-60454) TLB-BMP (Basic Metabolic Panel-BMET) (80048-METABOL) TLB-Hepatic/Liver Function Pnl (80076-HEPATIC) TLB-CBC Platelet - w/Differential (85025-CBCD) TLB-Lipid Panel (80061-LIPID) TLB-TSH (Thyroid Stimulating Hormone) (84443-TSH) TLB-A1C / Hgb A1C (Glycohemoglobin) (83036-A1C)  Problem # 3:  CORONARY ATHEROSCLEROSIS NATIVE CORONARY ARTERY (ICD-414.01) Followed by drMcDowell in [place] & stable on meds. Her updated medication list for this problem includes:    Aspirin 81 Mg Tbec (Aspirin) .Marland Kitchen... Take 1 by mouth once daily    Nitrostat 0.3 Mg Subl (Nitroglycerin) .Marland Kitchen... As needed as directed    Atenolol 50 Mg Tabs (Atenolol) .Marland Kitchen... Take 1 by mouth once daily    Lisinopril 20 Mg Tabs (Lisinopril) .Marland Kitchen... Take 1 tablet by mouth two times a day    Furosemide 40 Mg Tabs (Furosemide) .Marland Kitchen... Take 1 by mouth once daily, may use 1 extra daily as needed swelling  Problem # 4:  VENOUS INSUFFICIENCY (ICD-459.81) She knows to elim sodium, elevate legs, use Lasix> note: she is intol to TED hose, she says.  Problem # 5:  HYPERLIPIDEMIA (ICD-272.4) FLP looks good on the Simva80 + fish oil etc... tolerated well & we will continue the same rx! Her updated medication list for this problem includes:    Simvastatin 80 Mg  Tabs (Simvastatin) .Marland Kitchen... Take 1 tab by mouth at bedtime.  Problem # 6:  DM (ICD-250.00) DM remains poorly controlled & she needs to crank up the 70/30 insulin dose as we discussed... Currently taking 25-30u  AM & 20-25u PM, therefore incr to 35uAM & 30uPM as we discussed... Her updated medication list for this problem includes:    Aspirin 81 Mg Tbec (Aspirin) .Marland Kitchen... Take 1 by mouth once daily    Lisinopril 20 Mg Tabs (Lisinopril) .Marland Kitchen... Take 1 tablet by mouth two times a day    Humulin 70/30 70-30 % Susp (Insulin isophane & regular) .Marland Kitchen... Take insulin shots twice daily & slowly increase the doses titrated to your home bs results...    Glucophage 500 Mg Tabs (Metformin hcl) .Marland Kitchen... Take 1 by mouth two times a day  Problem # 7:  OBESITY (ICD-278.00) wt reduction is critically important!!!  Problem # 8:  GERD (ICD-530.81) GI is stable>  continue same meds. Her updated medication list for this problem includes:    Omeprazole 20 Mg Tbec (Omeprazole) .Marland Kitchen... Take 1 tab by mouth once daily...  Problem # 9:  DEGENERATIVE JOINT DISEASE (ICD-715.90) Ortho per DrNorris... Her updated medication list for this problem includes:    Aspirin 81 Mg Tbec (Aspirin) .Marland Kitchen... Take 1 by mouth once daily    Hydrocodone-acetaminophen 10-660 Mg Tabs (Hydrocodone-acetaminophen) .Marland Kitchen... Take as needed for pain    Etodolac 400 Mg Tabs (Etodolac) .Marland Kitchen... Take 1 by mouth every 12 hours  Problem # 10:  ANXIETY (ICD-300.00) Alpraz refilled per request... Her updated medication list for this problem includes:  Alprazolam 0.5 Mg Tabs (Alprazolam) .Marland Kitchen... Take 1/2 -1 tablet by mouth three times a day as needed  Complete Medication List: 1)  Flonase 50 Mcg/act Susp (Fluticasone propionate) .... Use 2 puffs in each nostril twice daily.Marland KitchenMarland Kitchen 2)  Albuterol Sulfate (2.5 Mg/20ml) 0.083% Nebu (Albuterol sulfate) .... Inhale 1 vial via hhn four times a day 3)  Ipratropium Bromide 0.02 % Soln (Ipratropium bromide) .... Inhale 1 vial via hhn  four times a day 4)  Pulmicort 0.25 Mg/28ml Susp (Budesonide) .... Use in neb two times a day 5)  Mucinex 600 Mg Tb12 (Guaifenesin) .... Take 2 by mouth two times a day w/ plenty of fluids.Marland KitchenMarland Kitchen 6)  Aspirin 81 Mg Tbec (Aspirin) .... Take 1 by mouth once daily 7)  Nitrostat 0.3 Mg Subl (Nitroglycerin) .... As needed as directed 8)  Atenolol 50 Mg Tabs (Atenolol) .... Take 1 by mouth once daily 9)  Lisinopril 20 Mg Tabs (Lisinopril) .... Take 1 tablet by mouth two times a day 10)  Furosemide 40 Mg Tabs (Furosemide) .... Take 1 by mouth once daily, may use 1 extra daily as needed swelling 11)  Simvastatin 80 Mg Tabs (Simvastatin) .... Take 1 tab by mouth at bedtime. 12)  Fish Oil 1000 Mg Caps (Omega-3 fatty acids) .... Take 1 tablet by mouth once a day 13)  Humulin 70/30 70-30 % Susp (Insulin isophane & regular) .... Take insulin shots twice daily & slowly increase the doses titrated to your home bs results... 14)  Glucophage 500 Mg Tabs (Metformin hcl) .... Take 1 by mouth two times a day 15)  Omeprazole 20 Mg Tbec (Omeprazole) .... Take 1 tab by mouth once daily... 16)  Hydrocodone-acetaminophen 10-660 Mg Tabs (Hydrocodone-acetaminophen) .... Take as needed for pain 17)  Etodolac 400 Mg Tabs (Etodolac) .... Take 1 by mouth every 12 hours 18)  Boniva 150 Mg Tabs (Ibandronate sodium) .... Take one tablet by mouth every month 19)  Oscal 500/200 D-3 500-200 Mg-unit Tabs (Calcium-vitamin d) .... Take one by mouth once daily 20)  Vitamin D 1000 Unit Tabs (Cholecalciferol) .... Take 1 cap daily... 21)  Gabapentin 100 Mg Caps (Gabapentin) .... Take 1 by mouth at bedtime for 10d, then 2 at bedtime for 10d, then 3 at bedtime thereafter... 22)  Alprazolam 0.5 Mg Tabs (Alprazolam) .... Take 1/2 -1 tablet by mouth three times a day as needed 23)  Iron 325 (65 Fe) Mg Tabs (Ferrous sulfate) .... Take 1 by mouth once daily 24)  B Complex-vitamin B12 Tabs (B complex-folic acid) .... Take 1 tablet by mouth once a  day 25)  Onetouch Ultra Test Strp (Glucose blood) .... Test blood sugar two times a day 26)  Hydromet 5-1.5 Mg/24ml Syrp (Hydrocodone-homatropine) .... Take 1-2 teaspoons every 4-6 hours as needed cough - may make you sleepy 27)  Prednisone 20 Mg Tabs (Prednisone) .... Take 1 tab by mouth two times a day x4d, then 1 tab daily x4d, then 1/2 tab daily x4d, then 1/2 tab every other day til gone...  Patient Instructions: 1)  Today we updated your med list- see below.... 2)  We decided to add a tapering course of PREDNISONE for your resp track symptoms> follow the label directions... 3)  For your SINUSES:  use a SALINE nasal mist every hour to clean & moisturize the nasal mucosa;  continue the MUCINEX 2 tabs twice daily w/ plenty of water by mouth;  use the FLUTICASONE nasal spray ineach nostril at bedtime... (next step: ENT evaluation).Marland KitchenMarland Kitchen 4)  Today we did your follow up FASTING blood work... please call the "phone tree" in a few days for your lab results.Marland KitchenMarland Kitchen 5)  Call for any questions.Marland KitchenMarland Kitchen 6)  Please schedule a follow-up appointment in 6 months, sooner as needed. Prescriptions: PREDNISONE 20 MG TABS (PREDNISONE) take 1 tab by mouth two times a day x4d, then 1 tab daily x4d, then 1/2 tab daily x4d, then 1/2 tab every other day til gone...  #16 x 0   Entered and Authorized by:   Michele Mcalpine MD   Signed by:   Michele Mcalpine MD on 04/24/2010   Method used:   Print then Give to Patient   RxID:   [id number]    Immunization History:  Influenza Immunization History:    Influenza:  declined (04/24/2010)  Pneumovax Immunization History:    Pneumovax:  declined (04/24/2010)

## 2010-05-16 NOTE — Assessment & Plan Note (Signed)
Summary: FOLLOW UP - 6 MONTHS  Medications Added LISINOPRIL 20 MG TABS (LISINOPRIL) Take 1 tablet by mouth daily HUMULIN 70/30 70-30 %  SUSP (INSULIN ISOPHANE & REGULAR) take 34 units am 26 pm FERROUS SULFATE 325 (65 FE) MG TABS (FERROUS SULFATE) take 1 tab daily BENADRYL 25 MG TABS (DIPHENHYDRAMINE HCL) as needed GAS-X EXTRA STRENGTH 125 MG CHEW (SIMETHICONE) as needed      Allergies Added:   Visit Type:  Follow-up Primary Provider:  Dr. Alroy Dust   History of Present Illness: 70 year old woman presents for followup. She was seen in September 2011.  Recent lab work from February reviewed showing AST 16, ALT 26, cholesterol 139, triglycerides 96, HDL 40, LDL 80. Lipids are in good order.  She had a followup dobutamine Myoview in July 2008, which demonstrated moderate anteroapical and anteroseptal scar without ischemia and an ejection fraction of 53%.  She denies any angina or nitroglycerin use. Has chronic dyspnea on exertion, unchanged recently. States that she has been recently getting over a "virus."  Current Medications (verified): 1)  Flonase 50 Mcg/act  Susp (Fluticasone Propionate) .... Use 2 Puffs in Each Nostril Twice Daily.Marland KitchenMarland Kitchen 2)  Albuterol Sulfate (2.5 Mg/22ml) 0.083% Nebu (Albuterol Sulfate) .... Inhale 1 Vial Via Hhn Four Times A Day 3)  Ipratropium Bromide 0.02 % Soln (Ipratropium Bromide) .... Inhale 1 Vial Via Hhn Four Times A Day 4)  Pulmicort 0.25 Mg/33ml  Susp (Budesonide) .... Use in Neb Two Times A Day 5)  Mucinex 600 Mg  Tb12 (Guaifenesin) .... Take 2 By Mouth Two Times A Day W/ Plenty of Fluids.Marland KitchenMarland Kitchen 6)  Aspirin 81 Mg  Tbec (Aspirin) .... Take 1 By Mouth Once Daily 7)  Nitrostat 0.3 Mg  Subl (Nitroglycerin) .... As Needed As Directed 8)  Atenolol 50 Mg  Tabs (Atenolol) .... Take 1 By Mouth Once Daily 9)  Lisinopril 20 Mg Tabs (Lisinopril) .... Take 1 Tablet By Mouth Daily 10)  Furosemide 40 Mg  Tabs (Furosemide) .... Take 1 By Mouth Once Daily, May Use 1 Extra  Daily As Needed Swelling 11)  Simvastatin 80 Mg Tabs (Simvastatin) .... Take 1 Tab By Mouth At Bedtime. 12)  Fish Oil 1000 Mg Caps (Omega-3 Fatty Acids) .... Take 1 Tablet By Mouth Once A Day 13)  Humulin 70/30 70-30 %  Susp (Insulin Isophane & Regular) .... Take 34 Units Am 26 Pm 14)  Glucophage 500 Mg  Tabs (Metformin Hcl) .... Take 1 By Mouth Two Times A Day 15)  Omeprazole 20 Mg Tbec (Omeprazole) .... Take 1 Tab By Mouth Once Daily... 16)  Hydrocodone-Acetaminophen 10-660 Mg Tabs (Hydrocodone-Acetaminophen) .... Take As Needed For Pain 17)  Etodolac 400 Mg  Tabs (Etodolac) .... Take 1 By Mouth Every 12 Hours 18)  Boniva 150 Mg  Tabs (Ibandronate Sodium) .... Take One Tablet By Mouth Every Month 19)  Oscal 500/200 D-3 500-200 Mg-Unit  Tabs (Calcium-Vitamin D) .... Take One By Mouth Once Daily 20)  Vitamin D 1000 Unit Tabs (Cholecalciferol) .... Take 1 Cap Daily... 21)  Gabapentin 100 Mg Caps (Gabapentin) .... Take 1 By Mouth At Bedtime For 10d, Then 2 At Bedtime For 10d, Then 3 At Bedtime Thereafter... 22)  Alprazolam 0.5 Mg  Tabs (Alprazolam) .... Take 1/2 -1 Tablet By Mouth Three Times A Day As Needed 23)  Iron 325 (65 Fe) Mg  Tabs (Ferrous Sulfate) .... Take 1 By Mouth Once Daily 24)  B Complex-Vitamin B12  Tabs (B Complex-Folic Acid) .... Take 1 Tablet By  Mouth Once A Day 25)  Onetouch Ultra Test   Strp (Glucose Blood) .... Test Blood Sugar Two Times A Day 26)  Hydromet 5-1.5 Mg/69ml Syrp (Hydrocodone-Homatropine) .... Take 1-2 Teaspoons Every 4-6 Hours As Needed Cough - May Make You Sleepy 27)  Prednisone 20 Mg Tabs (Prednisone) .... Take 1 Tab By Mouth Two Times A Day X4d, Then 1 Tab Daily X4d, Then 1/2 Tab Daily X4d, Then 1/2 Tab Every Other Day Til Gone... 28)  Ferrous Sulfate 325 (65 Fe) Mg Tabs (Ferrous Sulfate) .... Take 1 Tab Daily 29)  Benadryl 25 Mg Tabs (Diphenhydramine Hcl) .... As Needed 30)  Gas-X Extra Strength 125 Mg Chew (Simethicone) .... As Needed  Allergies  (verified): 1)  ! Codeine 2)  ! Brethine  Comments:  Nurse/Medical Assistant: patient brought med list she uses [facility] in [place]  Past History:  Past Medical History: Last updated: 04/24/2010 Anemia Anxiety Arthritis CAD - PTCA/BMS LAD 2000, PTCA/brachytherapy LAD 2001, LVEF 55% C O P D Diabetes Type 2 G E R D Hyperlipidemia Hypertension Osteoporosis Venous insufficiency Obesity Myocardial Infarction - AMI 2000  Social History: Last updated: 04/06/2009 Widowed  Tobacco Use - Former, quit 2000 Alcohol Use - no Drug Use - no  Review of Systems       The patient complains of dyspnea on exertion.  The patient denies anorexia, fever, weight loss, chest pain, syncope, peripheral edema, prolonged cough, melena, and hematochezia.         Otherwise reviewed and negative.  Vital Signs:  Patient profile:   70 year old female Weight:      213 pounds BMI:     36.13 Pulse rate:   60 / minute BP sitting:   146 / 77  (left arm)  Vitals Entered By: Dreama Saa, CNA (May 09, 2010 3:02 PM)  Physical Exam  Additional Exam:  WD, Obese, 70 y/o WF in NAD... GENERAL:  Alert & oriented; pleasant & cooperative...  HEENT:  [place]/AT, EOM-wnl, PERRLA, EACs-clear, TMs-wnl, NOSE-clear, THROAT-clear & wnl. NECK:  Supple w/ fairROM; no JVD; normal carotid impulses w/o bruits; no thyromegaly or nodules palpated; no lymphadenopathy. CHEST:  Decr BS bilat, few scat rhonchi, without wheezing, rales, or rubs...upper airway psuedo-wheezing HEART:  Regular Rhythm;  gr 1/6 SEM without rubs or gallops detected... ABDOMEN:  Obese, soft & nontender; normal bowel sounds; no organomegaly or masses detected. EXT: without deformities, mild arthritic changes; no varicose veins/ +venous insuffic/ tr edema. NEURO:  CN's intact; no focal neuro deficits... DERM:  no lesion seen...    EKG  Procedure date:  05/09/2010  Findings:      Sinus bradycardia at 54 beats per minute, left anterior  fascicular block, left ventricular hypertrophy, evidence of previous anterior infarct.  Impression & Recommendations:  Problem # 1:  CORONARY ATHEROSCLEROSIS NATIVE CORONARY ARTERY (ICD-414.01)  Symptomatically stable. ECG reviewed. Continue medical therapy and symptom observation with follow up in 6 months.  Her updated medication list for this problem includes:    Aspirin 81 Mg Tbec (Aspirin) .Marland Kitchen... Take 1 by mouth once daily    Nitrostat 0.3 Mg Subl (Nitroglycerin) .Marland Kitchen... As needed as directed    Atenolol 50 Mg Tabs (Atenolol) .Marland Kitchen... Take 1 by mouth once daily    Lisinopril 20 Mg Tabs (Lisinopril) .Marland Kitchen... Take 1 tablet by mouth daily  Problem # 2:  COPD (ICD-496)  Followed by Dr. Kriste Basque.  Her updated medication list for this problem includes:    Albuterol Sulfate (2.5 Mg/51ml) 0.083%  Nebu (Albuterol sulfate) ..... Inhale 1 vial via hhn four times a day    Ipratropium Bromide 0.02 % Soln (Ipratropium bromide) ..... Inhale 1 vial via hhn four times a day    Pulmicort 0.25 Mg/52ml Susp (Budesonide) ..... Use in neb two times a day  Problem # 3:  HYPERLIPIDEMIA (ICD-272.4)  Recent LDL at goal. Continue present medicines.  Her updated medication list for this problem includes:    Simvastatin 80 Mg Tabs (Simvastatin) .Marland Kitchen... Take 1 tab by mouth at bedtime.  Patient Instructions: 1)  Your physician recommends that you schedule a follow-up appointment in: 6 months 2)  Your physician recommends that you continue on your current medications as directed. Please refer to the Current Medication list given to you today.

## 2010-05-18 ENCOUNTER — Inpatient Hospital Stay (HOSPITAL_COMMUNITY)
Admission: EM | Admit: 2010-05-18 | Discharge: 2010-05-25 | DRG: 190 | Disposition: A | Discharge status: Home Health | Payer: Medicare Other | Attending: Internal Medicine | Admitting: Internal Medicine

## 2010-05-18 ENCOUNTER — Emergency Department (HOSPITAL_COMMUNITY): Payer: Medicare Other

## 2010-05-18 DIAGNOSIS — R112 Nausea with vomiting, unspecified: Secondary | ICD-10-CM | POA: Diagnosis present

## 2010-05-18 DIAGNOSIS — Z9981 Dependence on supplemental oxygen: Secondary | ICD-10-CM

## 2010-05-18 DIAGNOSIS — K254 Chronic or unspecified gastric ulcer with hemorrhage: Secondary | ICD-10-CM | POA: Diagnosis present

## 2010-05-18 DIAGNOSIS — N179 Acute kidney failure, unspecified: Secondary | ICD-10-CM | POA: Diagnosis present

## 2010-05-18 DIAGNOSIS — IMO0001 Reserved for inherently not codable concepts without codable children: Secondary | ICD-10-CM | POA: Diagnosis present

## 2010-05-18 DIAGNOSIS — K259 Gastric ulcer, unspecified as acute or chronic, without hemorrhage or perforation: Secondary | ICD-10-CM | POA: Diagnosis present

## 2010-05-18 DIAGNOSIS — D62 Acute posthemorrhagic anemia: Secondary | ICD-10-CM | POA: Diagnosis present

## 2010-05-18 DIAGNOSIS — I129 Hypertensive chronic kidney disease with stage 1 through stage 4 chronic kidney disease, or unspecified chronic kidney disease: Secondary | ICD-10-CM | POA: Diagnosis present

## 2010-05-18 DIAGNOSIS — A048 Other specified bacterial intestinal infections: Secondary | ICD-10-CM | POA: Diagnosis present

## 2010-05-18 DIAGNOSIS — I252 Old myocardial infarction: Secondary | ICD-10-CM

## 2010-05-18 DIAGNOSIS — E785 Hyperlipidemia, unspecified: Secondary | ICD-10-CM | POA: Diagnosis present

## 2010-05-18 DIAGNOSIS — I2510 Atherosclerotic heart disease of native coronary artery without angina pectoris: Secondary | ICD-10-CM | POA: Diagnosis present

## 2010-05-18 DIAGNOSIS — K219 Gastro-esophageal reflux disease without esophagitis: Secondary | ICD-10-CM | POA: Diagnosis present

## 2010-05-18 DIAGNOSIS — Z9861 Coronary angioplasty status: Secondary | ICD-10-CM

## 2010-05-18 DIAGNOSIS — N182 Chronic kidney disease, stage 2 (mild): Secondary | ICD-10-CM | POA: Diagnosis present

## 2010-05-18 DIAGNOSIS — R197 Diarrhea, unspecified: Secondary | ICD-10-CM | POA: Diagnosis present

## 2010-05-18 DIAGNOSIS — J441 Chronic obstructive pulmonary disease with (acute) exacerbation: Principal | ICD-10-CM | POA: Diagnosis present

## 2010-05-18 LAB — COMPREHENSIVE METABOLIC PANEL
AST: 21 U/L (ref 0–37)
CO2: 28 mEq/L (ref 19–32)
Calcium: 9 mg/dL (ref 8.4–10.5)
Creatinine, Ser: 1.51 mg/dL — ABNORMAL HIGH (ref 0.4–1.2)
GFR calc Af Amer: 41 mL/min — ABNORMAL LOW (ref >60)
GFR calc non Af Amer: 34 mL/min — ABNORMAL LOW (ref >60)

## 2010-05-18 LAB — DIFFERENTIAL
Basophils Absolute: 0 10*3/uL (ref 0.0–0.1)
Lymphs Abs: 0.8 10*3/uL (ref 0.7–4.0)
Monocytes Absolute: 1 10*3/uL (ref 0.1–1.0)
Neutrophils Relative %: 81 % — ABNORMAL HIGH (ref 43–77)

## 2010-05-18 LAB — URINALYSIS, ROUTINE W REFLEX MICROSCOPIC
Glucose, UA: >1000 mg/dL — AB
Nitrite: NEGATIVE
Protein, ur: 30 mg/dL — AB
Specific Gravity, Urine: 1.019 (ref 1.005–1.030)

## 2010-05-18 LAB — URINE MICROSCOPIC-ADD ON

## 2010-05-18 LAB — CBC
HCT: 40.2 % (ref 36.0–46.0)
Platelets: 150 10*3/uL (ref 150–400)
RDW: 12.1 % (ref 11.5–15.5)

## 2010-05-18 LAB — LIPASE, BLOOD: Lipase: 28 U/L (ref 11–59)

## 2010-05-19 ENCOUNTER — Inpatient Hospital Stay (HOSPITAL_COMMUNITY): Payer: Medicare Other

## 2010-05-19 LAB — COMPREHENSIVE METABOLIC PANEL
ALT: 25 U/L (ref 0–35)
AST: 24 U/L (ref 0–37)
Calcium: 9 mg/dL (ref 8.4–10.5)
GFR calc Af Amer: 39 mL/min — ABNORMAL LOW (ref >60)
Sodium: 139 mEq/L (ref 135–145)
Total Protein: 5.9 g/dL — ABNORMAL LOW (ref 6.0–8.3)

## 2010-05-19 LAB — GLUCOSE, CAPILLARY
Glucose-Capillary: 424 mg/dL — ABNORMAL HIGH (ref 70–99)
Glucose-Capillary: 391 mg/dL — ABNORMAL HIGH (ref 70–99)
Glucose-Capillary: 356 mg/dL — ABNORMAL HIGH (ref 70–99)
Glucose-Capillary: 399 mg/dL — ABNORMAL HIGH (ref 70–99)
Glucose-Capillary: 301 mg/dL — ABNORMAL HIGH (ref 70–99)

## 2010-05-19 LAB — APTT: aPTT: 33 seconds (ref 24–37)

## 2010-05-19 LAB — CBC
Hemoglobin: 12.2 g/dL (ref 12.0–15.0)
RBC: 4.14 MIL/uL (ref 3.87–5.11)
WBC: 8.2 10*3/uL (ref 4.0–10.5)

## 2010-05-19 LAB — DIFFERENTIAL
Basophils Absolute: 0 10*3/uL (ref 0.0–0.1)
Basophils Relative: 0 % (ref 0–1)
Monocytes Relative: 2 % — ABNORMAL LOW (ref 3–12)
Neutro Abs: 7.6 10*3/uL (ref 1.7–7.7)
Neutrophils Relative %: 93 % — ABNORMAL HIGH (ref 43–77)

## 2010-05-19 LAB — HEMOGLOBIN A1C: Mean Plasma Glucose: 237 mg/dL — ABNORMAL HIGH (ref <117)

## 2010-05-19 LAB — URINE CULTURE

## 2010-05-19 LAB — TSH: TSH: 0.557 u[IU]/mL (ref 0.350–4.500)

## 2010-05-19 LAB — PROTIME-INR
INR: 0.99 (ref 0.00–1.49)
Prothrombin Time: 13.3 seconds (ref 11.6–15.2)

## 2010-05-19 LAB — BRAIN NATRIURETIC PEPTIDE: Pro B Natriuretic peptide (BNP): 290 pg/mL — ABNORMAL HIGH (ref 0.0–100.0)

## 2010-05-19 LAB — LIPID PANEL
LDL Cholesterol: 94 mg/dL (ref 0–99)
Triglycerides: 89 mg/dL (ref <150)

## 2010-05-19 NOTE — H&P (Signed)
[name], [name]                   ACCOUNT NO.:  [id number]  MEDICAL RECORD NO.:  [id number]           PATIENT TYPE:  E  LOCATION:  [facility]                         FACILITY:  [facility]  PHYSICIAN:  Kathlen Mody, MD       DATE OF BIRTH:  [birth year]  DATE OF ADMISSION:  05/18/2010 DATE OF DISCHARGE:                             HISTORY & PHYSICAL   PRIMARY CARE PHYSICIAN:  Lonzo Cloud. Kriste Basque, MD.  CHIEF COMPLAINT:  Fever, chills, nausea, vomiting, diarrhea since 3 days.  HISTORY OF PRESENT ILLNESS:  This is a 70 year old lady with past medical history of coronary artery disease, COPD, hypertension, diabetes, who was admitted to the hospital, brought in by EMS with fever and chills associated with shortness of breath for more than 3 days. Initially, the fever started followed by chills and trouble breathing, and history of cough productive of white-colored sputum.  No chest pain. This morning, the patient does report that she did not know where she was and what she was doing.  The last thing that she remembers was a conversation with her daughter and the next thing she remembers was that she was in the hospital.  The patient's daughter called her this morning and heard her mumbling on the phone, came to the patient's house and found her very confused, and she had a fever of 103.9.  At this time, the patient is oriented x3 and the confusion has resolved.  The patient also complains of nausea, vomiting, and diarrhea since 2 days.  No abdominal pain.  No history of sick contacts.  The patient has been on antibiotics intermittently for the last 3 months; in the last week of December, in the last week of January, and in the month of February for bronchitis.  History of occasional headaches.  No photophobia, blurry vision.  No tingling or numbness anywhere in the body.  No focal deficits.  The patient lives by herself at home and is on home oxygen, and uses oxygen occasionally.  REVIEW OF SYSTEMS:   See HPI, otherwise negative.  PAST MEDICAL HISTORY: 1. Coronary artery disease, status post MI in 2000 and status post     PTCA in 2000, 2001 and 2002. 2. COPD, on home oxygen p.r.n. 3. Hypertension. 4. Hyperlipidemia. 5. Type 2 diabetes. 6. Morbid obesity 7. Gastroesophageal reflux disease. 8. Osteoarthritis. 9. Past tobacco abuse.  SURGICAL HISTORY:  Cholecystectomy.  FAMILY HISTORY:  MI in the father and coronary artery disease in the family.  ALLERGIES:  Allergic to CODEINE #3.  SOCIAL HISTORY:  The patient is a widow, she lives by herself.  She has two daughters who live close by.  She is an ex-smoker, quit smoking in 2000.  Denies alcohol and any drug abuse.  HOME MEDICATIONS: 1. Zocor 80 mg 1 tablet daily. 2. Vitamin B12 500 mg p.o. 1 tablet daily. 3. Pulmicort 0.25 mg inhalation four times daily. 4. Calcium carbonate 1 tablet twice daily. 5. Nitroglycerin 1 tablet as needed. 6. Mucinex 1 tablet twice daily. 7. Lisinopril 20 mg 1 tablet daily. 8. Iron 65 mg 1 tablet daily. 9.  Atrovent nebulizations 1 vial four times a day as needed. 10.Humulin 70/30 26 units in the evening, and 34 units in the morning. 11.Glucophage 500 mg p.o. 1 tablet daily. 12.Lasix 40 mg p.o. 1 tablet daily. 13.Flonase 2 sprays daily. 14.Fish oil 1000 mg p.o. 1 tablet daily. 15.Ferrous sulfate 325 mg p.o. 1 tablet daily. 16.Etodolac 400 mg p.o. 1 tablets q.12 h. 17.DuoNebs 1 vial 4 times daily. 18.Atenolol 50 mg p.o. 1 tablet daily. 19.Aspirin 81 mg 1 tablet daily. 20.Alprazolam 0.5 mg p.o. 1 tablet three times a day. 21.Artificial tears 1 drop twice daily. 22.Boniva 150 mg p.o. 1 tablet once monthly.  PHYSICAL EXAMINATION:  VITAL SIGNS:  She has a temperature of 101 at this time, pulse of 84 per minute, respiratory rate 16, blood pressure 129/60, saturating 94% on 2 L of nasal cannula.  GENERAL:  She is alert, afebrile, comfortable, oriented x3 in no acute distress.  HEENT:   Pupils equal and reacting to light.  Dry mucous membranes.  NECK:  No JVD. CARDIOVASCULAR:  S1, S2 heard.  No rubs, murmurs or gallops. RESPIRATORY:  Decreased air entry bilateral.  No wheezes or rhonchi. ABDOMEN:  Obese, soft and nontender, nondistended.  Good bowel sounds. EXTREMITIES:  No pedal edema.  NEUROLOGICAL:  Nonfocal.  PERTINENT LABORATORY AND X-RAY DATA:  CBC within normal limits.  Lipase 28.  Comprehensive metabolic panel significant for a glucose of 330, BUN of 24, creatinine of 1.5. Urine analysis negative for nitrite and leukocytes, shows small blood, large bilirubin.  Chest x-ray:  Hyperaeration consistent with COPD, stable cardiomegaly.  ASSESSMENT: 1. Shortness of breath, fevers and chills. 2. Nausea, vomiting, and diarrhea. 3. Acute renal failure. 4. Chronic obstructive pulmonary disease. 5. Diabetes. 6. Hypertension. 7. Coronary artery disease. 8. Gastroesophageal reflux disease.  PLAN:  This is a 71 year old lady admitted for fevers, chills and shortness of breath with a temperature of 101, no leukocytosis.  Chest x- ray consistent with hyperaeration, with no pneumonia.  We will treat the patient for COPD exacerbation.  I will start the patient on IV Solu- Medrol 60 mg q.6, keep the patient on 2 L of nasal cannula at this time to keep oxygen saturation more than 90%.  The patient was given IV vancomycin and IV ceftriaxone in the ER.  Start the patient on IV Avelox 400 mg from 05/19/2010, continue Pulmicort inhalation four times daily, nebulizers q.4 h as needed, and Mucinex 1 tablet twice daily.  Nausea, vomiting, and diarrhea.  No abdominal pain.  The patient has been on antibiotics for the last 2 months for bronchitis.  Will rule out C difficile diarrhea.  Will send stool for stool WBCs, stool cultures, C difficile PCR.  Will not start the patient on Cipro or Flagyl at this time.  Acute renal failure:  The patient has normal creatinine in the  past. Her creatinine baseline is 1.1, at this time her creatinine is 1.5. Will hold the patient's metformin and Lasix at this time, she appears to be dehydrated.  We will give her gentle hydration, normal saline at 60 mL per hour.  We will recheck her BUN and creatinine tomorrow with electrolytes.  Type 2 diabetes on 70/30 insulin and Glucophage:  Hold the metformin for elevated creatinine.  Continue home dose of insulin.  Put the patient on NovoLog sliding scale t.i.d. a.c. and at bedtime.  Hypertension:  On admission, the patient's blood pressure was low, systolic was 90, diastolic 50.  Will hold the blood pressure medications at  this time, can be restarted in the morning when her blood pressure is better.  Coronary artery disease appears to be stable at this time.  Continue with her home dose of aspirin.  DVT prophylaxis:  Subcu Lovenox.  GI prophylaxis:  Protonix 40 mg daily.  Out of bed to chair, PT, OT evaluation.  The patient is Full Code.          ______________________________ Kathlen Mody, MD     VA/MEDQ  D:  05/18/2010  T:  05/18/2010  Job:  [id number]  Electronically Signed by Kathlen Mody MD on 05/19/2010 06:00:46 PM

## 2010-05-20 LAB — GLUCOSE, CAPILLARY
Glucose-Capillary: 306 mg/dL — ABNORMAL HIGH (ref 70–99)
Glucose-Capillary: 303 mg/dL — ABNORMAL HIGH (ref 70–99)

## 2010-05-20 LAB — CBC
MCV: 94 fL (ref 78.0–100.0)
Platelets: 155 10*3/uL (ref 150–400)
RBC: 3.98 MIL/uL (ref 3.87–5.11)
RDW: 12.1 % (ref 11.5–15.5)
WBC: 12.4 10*3/uL — ABNORMAL HIGH (ref 4.0–10.5)

## 2010-05-20 LAB — BASIC METABOLIC PANEL
Chloride: 103 mEq/L (ref 96–112)
GFR calc Af Amer: 52 mL/min — ABNORMAL LOW (ref >60)
GFR calc non Af Amer: 43 mL/min — ABNORMAL LOW (ref >60)
Potassium: 4.5 mEq/L (ref 3.5–5.1)
Sodium: 139 mEq/L (ref 135–145)

## 2010-05-21 ENCOUNTER — Inpatient Hospital Stay (HOSPITAL_COMMUNITY): Payer: Medicare Other

## 2010-05-21 LAB — HEMOGLOBIN AND HEMATOCRIT, BLOOD
HCT: 30 % — ABNORMAL LOW (ref 36.0–46.0)
Hemoglobin: 9.8 g/dL — ABNORMAL LOW (ref 12.0–15.0)

## 2010-05-21 LAB — CBC
HCT: 34.7 % — ABNORMAL LOW (ref 36.0–46.0)
MCHC: 31.1 g/dL (ref 30.0–36.0)
MCV: 94.6 fL (ref 78.0–100.0)
RDW: 12 % (ref 11.5–15.5)

## 2010-05-21 LAB — GLUCOSE, CAPILLARY: Glucose-Capillary: 315 mg/dL — ABNORMAL HIGH (ref 70–99)

## 2010-05-21 LAB — BASIC METABOLIC PANEL
BUN: 37 mg/dL — ABNORMAL HIGH (ref 6–23)
Calcium: 9.2 mg/dL (ref 8.4–10.5)
GFR calc non Af Amer: 46 mL/min — ABNORMAL LOW (ref >60)
Glucose, Bld: 299 mg/dL — ABNORMAL HIGH (ref 70–99)
Sodium: 140 mEq/L (ref 135–145)

## 2010-05-22 DIAGNOSIS — K625 Hemorrhage of anus and rectum: Secondary | ICD-10-CM

## 2010-05-22 DIAGNOSIS — D62 Acute posthemorrhagic anemia: Secondary | ICD-10-CM

## 2010-05-22 DIAGNOSIS — J449 Chronic obstructive pulmonary disease, unspecified: Secondary | ICD-10-CM

## 2010-05-22 LAB — BASIC METABOLIC PANEL
CO2: 28 mEq/L (ref 19–32)
Calcium: 9.1 mg/dL (ref 8.4–10.5)
Creatinine, Ser: 1.44 mg/dL — ABNORMAL HIGH (ref 0.4–1.2)
GFR calc Af Amer: 44 mL/min — ABNORMAL LOW (ref >60)

## 2010-05-22 LAB — CBC
MCH: 29.7 pg (ref 26.0–34.0)
MCHC: 32.3 g/dL (ref 30.0–36.0)
Platelets: 168 10*3/uL (ref 150–400)

## 2010-05-22 LAB — GLUCOSE, CAPILLARY
Glucose-Capillary: 191 mg/dL — ABNORMAL HIGH (ref 70–99)
Glucose-Capillary: 138 mg/dL — ABNORMAL HIGH (ref 70–99)

## 2010-05-22 LAB — HEMOGLOBIN AND HEMATOCRIT, BLOOD: HCT: 24.6 % — ABNORMAL LOW (ref 36.0–46.0)

## 2010-05-23 ENCOUNTER — Other Ambulatory Visit: Payer: Self-pay | Admitting: Gastroenterology

## 2010-05-23 DIAGNOSIS — K921 Melena: Secondary | ICD-10-CM

## 2010-05-23 DIAGNOSIS — K259 Gastric ulcer, unspecified as acute or chronic, without hemorrhage or perforation: Secondary | ICD-10-CM

## 2010-05-23 DIAGNOSIS — K2970 Gastritis, unspecified, without bleeding: Secondary | ICD-10-CM

## 2010-05-23 DIAGNOSIS — D649 Anemia, unspecified: Secondary | ICD-10-CM

## 2010-05-23 DIAGNOSIS — K2990 Gastroduodenitis, unspecified, without bleeding: Secondary | ICD-10-CM

## 2010-05-23 LAB — GLUCOSE, CAPILLARY
Glucose-Capillary: 165 mg/dL — ABNORMAL HIGH (ref 70–99)
Glucose-Capillary: 496 mg/dL — ABNORMAL HIGH (ref 70–99)
Glucose-Capillary: 501 mg/dL — ABNORMAL HIGH (ref 70–99)
Glucose-Capillary: 518 mg/dL — ABNORMAL HIGH (ref 70–99)
Glucose-Capillary: 71 mg/dL (ref 70–99)
Glucose-Capillary: 99 mg/dL (ref 70–99)

## 2010-05-23 LAB — FECAL LACTOFERRIN, QUANT: Fecal Lactoferrin: POSITIVE

## 2010-05-23 LAB — BASIC METABOLIC PANEL WITH GFR
BUN: 43 mg/dL — ABNORMAL HIGH (ref 6–23)
CO2: 30 meq/L (ref 19–32)
Calcium: 8.9 mg/dL (ref 8.4–10.5)
Chloride: 107 meq/L (ref 96–112)
Creatinine, Ser: 1.4 mg/dL — ABNORMAL HIGH (ref 0.4–1.2)
GFR calc non Af Amer: 37 mL/min — ABNORMAL LOW
Glucose, Bld: 89 mg/dL (ref 70–99)
Potassium: 4.2 meq/L (ref 3.5–5.1)
Sodium: 141 meq/L (ref 135–145)

## 2010-05-23 LAB — HEMOGLOBIN AND HEMATOCRIT, BLOOD: HCT: 28.3 % — ABNORMAL LOW (ref 36.0–46.0)

## 2010-05-24 ENCOUNTER — Telehealth: Payer: Self-pay

## 2010-05-24 LAB — CULTURE, BLOOD (ROUTINE X 2)
Culture  Setup Time: 201203152105
Culture: NO GROWTH

## 2010-05-24 LAB — BASIC METABOLIC PANEL
BUN: 32 mg/dL — ABNORMAL HIGH (ref 6–23)
Chloride: 106 mEq/L (ref 96–112)
GFR calc Af Amer: 42 mL/min — ABNORMAL LOW (ref >60)
Potassium: 3.6 mEq/L (ref 3.5–5.1)
Sodium: 143 mEq/L (ref 135–145)

## 2010-05-24 LAB — STOOL CULTURE

## 2010-05-24 LAB — GLUCOSE, CAPILLARY
Glucose-Capillary: 177 mg/dL — ABNORMAL HIGH (ref 70–99)
Glucose-Capillary: 171 mg/dL — ABNORMAL HIGH (ref 70–99)
Glucose-Capillary: 157 mg/dL — ABNORMAL HIGH (ref 70–99)
Glucose-Capillary: 239 mg/dL — ABNORMAL HIGH (ref 70–99)

## 2010-05-24 LAB — HEMOGLOBIN AND HEMATOCRIT, BLOOD
HCT: 26.6 % — ABNORMAL LOW (ref 36.0–46.0)
HCT: 29.8 % — ABNORMAL LOW (ref 36.0–46.0)

## 2010-05-24 NOTE — Telephone Encounter (Signed)
Pt scheduled for ROV letter mailed with date and time.

## 2010-05-24 NOTE — Telephone Encounter (Signed)
Message copied by Chales Abrahams on Wed May 24, 2010 11:17 AM ------      Message from: Rob Bunting      Created: Tue May 23, 2010 12:37 PM       She needs rov with me in 5-6 weeks.  Can you again page me to confirm you got this, thanks

## 2010-05-25 LAB — BASIC METABOLIC PANEL
CO2: 33 mEq/L — ABNORMAL HIGH (ref 19–32)
Chloride: 104 mEq/L (ref 96–112)
Creatinine, Ser: 1.37 mg/dL — ABNORMAL HIGH (ref 0.4–1.2)
GFR calc Af Amer: 46 mL/min — ABNORMAL LOW (ref >60)

## 2010-05-26 ENCOUNTER — Telehealth: Payer: Self-pay

## 2010-05-26 LAB — CROSSMATCH
ABO/RH(D): A POS
Antibody Screen: NEGATIVE
Unit division: 0

## 2010-05-26 NOTE — Telephone Encounter (Signed)
Left message on machine to call back  

## 2010-05-26 NOTE — Telephone Encounter (Signed)
Message copied by Chales Abrahams on Fri May 26, 2010  2:14 PM ------      Message from: Rob Bunting      Created: Fri May 26, 2010  1:01 PM       Please call her.  Biopsies during endoscopy showed H. Pylori.  She needs amoxicillin 1 gram twice daily, clarythromicin 500mg  twice daily.  PPI as already discussed.  Both abx for 10 days.

## 2010-05-29 ENCOUNTER — Telehealth: Payer: Self-pay

## 2010-05-29 ENCOUNTER — Encounter: Payer: Self-pay | Admitting: Adult Health

## 2010-05-29 ENCOUNTER — Ambulatory Visit (INDEPENDENT_AMBULATORY_CARE_PROVIDER_SITE_OTHER): Payer: Medicare Other | Admitting: Adult Health

## 2010-05-29 ENCOUNTER — Telehealth: Payer: Self-pay | Admitting: Pulmonary Disease

## 2010-05-29 VITALS — BP 124/66 | HR 86 | Temp 100.5°F | Ht 64.5 in | Wt 202.0 lb

## 2010-05-29 DIAGNOSIS — I10 Essential (primary) hypertension: Secondary | ICD-10-CM

## 2010-05-29 DIAGNOSIS — J449 Chronic obstructive pulmonary disease, unspecified: Secondary | ICD-10-CM

## 2010-05-29 MED ORDER — AMOXICILLIN 500 MG PO TABS: 1000.0000 mg | ORAL_TABLET | Freq: Two times a day (BID) | ORAL | Status: AC

## 2010-05-29 MED ORDER — PREDNISONE 10 MG PO TABS: ORAL_TABLET | ORAL | Status: AC

## 2010-05-29 MED ORDER — CLARITHROMYCIN 500 MG PO TABS: 500.0000 mg | ORAL_TABLET | Freq: Two times a day (BID) | ORAL | Status: AC

## 2010-05-29 MED ORDER — OLMESARTAN MEDOXOMIL 20 MG PO TABS: 20.0000 mg | ORAL_TABLET | Freq: Every day | ORAL | Status: DC

## 2010-05-29 NOTE — Telephone Encounter (Signed)
Pt advised of H pylori diagnoses.  Medication was sent to pharmacy.  Allergies were verified.

## 2010-05-29 NOTE — Progress Notes (Signed)
o2 sat RA at rest: 87% o2 sat on o2 w/ exertion: 96% 2L

## 2010-05-29 NOTE — Telephone Encounter (Signed)
See phone note

## 2010-05-29 NOTE — Progress Notes (Signed)
  Subjective:    Patient ID: [name], female    DOB: Mar 11, 1940, 70 y.o.   MRN: [id number]  HPI Comments: 70 y/o WF with known hx of  severe COPD-O2 dependenet ; HBP; CAD w/prev MI & PTCA followed by DrMcDowell; VI; Hyperlipidemia; DM on insulin; Obesity; GERD/ constipation; DJD/ osteoporosis; Anxiety...   05/29/2010 ACUTE OV: Presents for work in visit. She was recently admitted 3/15-3/22 for GI bleeding w/ gastritis and prepyloric ulcer s/p endo. Seen by GI tx w/ PPI , etodolac, asa, boniva stopped. Started on amoxicillin and clindamycin today (for H.Pylori).  She also had COPD exacerbation during her admission in which she was treated with steroids, neb and avelox. Discharged on avelox and steroid taper. She is now finished with however cough and congestion have not resolved. SHe is using mucinex with some help. Cough is worse at night keeping her up. CXR last week with no acute changes. Previously her ACE was stopped due to cough however restarted due to cost issues (was changed to ARB).   ~  baseline CXR = COPD, incr heart size, NAD.          Review of Systems   Constitutional:   No  weight loss, night sweats,   HEENT:   No headaches,  Difficulty swallowing,  Tooth/dental problems,  Sore throat,                No sneezing, itching, ear ache, nasal congestion, post nasal drip,   CV:  No chest pain,  Orthopnea, PND, swelling in lower extremities, anasarca, dizziness, palpitations  GI  No heartburn, indigestion, abdominal pain, nausea, vomiting, diarrhea, change in bowel habits, loss of appetite  Resp:    No coughing up of blood.     Skin: no rash or lesions.  GU: no dysuria, change in color of urine, no urgency or frequency.  No flank pain.  MS:  No joint pain or swelling.  No decreased range of motion.  No back pain.  Psych:  No change in mood or affect. No depression or anxiety.  No memory loss.                        Objective:   Physical Exam Gen:  Pleasant, obese, chronically ill appearing WF in wheelchair.  normal affect  ENT: No lesions,  mouth clear,  oropharynx clear, no postnasal drip  Neck: No JVD, no TMG, no carotid bruits  Lungs: Coarse BS w/ few scattered rhonchi, few exp wheezing    Cardiovascular: RRR, heart sounds normal, no murmur or gallops, no peripheral edema  Abdomen: soft and NT, no HSM,  BS normal  Musculoskeletal: No deformities, no cyanosis or clubbing  Neuro: alert, non focal  Skin: Warm, no lesions or rashes     .        Assessment & Plan:

## 2010-05-29 NOTE — Patient Instructions (Addendum)
Finish Clindamycin and Amoxicillin -take with food Eat yogurt Twice daily   Stop Lisinopril  Use Benicar 20mg   Daily for blood pressure (in place of Lisinopril).  Prednisone taper over next week.  Mucinex DM Twice daily   Fluids and rest Tylenol As needed  Fever  Hold fish oil follow up 1 week and As needed   Please contact office for sooner follow up if symptoms do not improve or worsen or seek emergency care

## 2010-05-29 NOTE — Telephone Encounter (Signed)
Called pt to schedule hfu. Pt daughter states pt needs to be seen today bc she is having increased sob. Pt is coming in to see TP today at 4:00  [facility], CMA

## 2010-05-30 ENCOUNTER — Encounter: Payer: Self-pay | Admitting: Adult Health

## 2010-05-30 NOTE — Assessment & Plan Note (Signed)
Slow to resolve COPD flare -suspect ACE inhibitor is contributing to slow recovery  Plan Finish Clindamycin and Amoxicillin -take with food Eat yogurt Twice daily   Stop Lisinopril  Use Benicar 20mg   Daily for blood pressure (in place of Lisinopril).  Prednisone taper over next week.  Mucinex DM Twice daily   Fluids and rest Tylenol As needed  Fever  Hold fish oil follow up 1 week and As needed   Please contact office for sooner follow up if symptoms do not improve or worsen or seek emergency care

## 2010-05-30 NOTE — Assessment & Plan Note (Signed)
Stop ace for now Change to benicar, sample given x 4 weeks  follow up next week for recheck

## 2010-05-30 NOTE — Assessment & Plan Note (Signed)
Slow to resolve exacerbation - PT w/ recent hospitalization with GIB  Now slow to resolve COPD flare- suspect ACE inhibitor is fueling cough Plan:

## 2010-06-01 NOTE — Procedures (Signed)
Summary: Upper Endoscopy  Patient: [name] Note: All result statuses are Final unless otherwise noted.  Tests: (1) Upper Endoscopy (EGD)   EGD Upper Endoscopy       DONE     [facility]     [address] [place], [place]  [address]          ENDOSCOPY PROCEDURE REPORT          PATIENT:  Margaret, Becker  MR#:  [id number]     BIRTHDATE:  1940/12/11, 69 yrs. old  GENDER:  female     ENDOSCOPIST:  Rachael Fee, MD     PROCEDURE DATE:  05/23/2010     PROCEDURE:  EGD with biopsy, [id number]     ASA CLASS:  Class III     INDICATIONS:  melena, anemia     MEDICATIONS:  Fentanyl 50 mcg IV, Versed 5 mg IV     TOPICAL ANESTHETIC:  Cetacaine Spray          DESCRIPTION OF PROCEDURE:   After the risks benefits and     alternatives of the procedure were thoroughly explained, informed     consent was obtained.  The  endoscope was introduced through the     mouth and advanced to the second portion of the duodenum, without     limitations.  The instrument was slowly withdrawn as the mucosa     was fully examined.     <<PROCEDUREIMAGES>>     There were two prepyloric ulcers. The largest was 1cm across. Both     appeared benign. Both were clean based without bleeding and they     somewhat distorted pyloric anatomy but scope still easily advanced     into duodenum. Background mild gastritis was biopsied and sent to     pathology (jar 1) (see image5).  Otherwise the examination was     normal (see image7, image6, image4, and image3).    Retroflexed     views revealed no abnormalities.    The scope was then withdrawn     from the patient and the procedure completed.     COMPLICATIONS:  None          ENDOSCOPIC IMPRESSION:     1) Two prepyloric ulcers (low risk for rebleeding), background     gastritis biopsied to check for H. pylori     2) Otherwise normal examination          RECOMMENDATIONS:     Continue on twice daily PPI (Protonix) for the next two months,     then OK to  decrease to once daily.     Should completely avoid all NSAIDs that are not absolutely     necessary.     If biopsies show H. pylori, you will be started on appropriate     antibiotics.     Dr. Christella Hartigan' office will get in touch to schedule return visit in     his office in 5-6 weeks.  Will probably plan on repeating EGD in     about 2 months to confirm ulcer healing.          ______________________________     Rachael Fee, MD          n.     eSIGNED:   Rachael Fee at 05/23/2010 12:41 PM          Joseph Art, [place], [id number]  Note: An exclamation mark (!) indicates a result that  was not dispersed into the flowsheet. Document Creation Date: 05/23/2010 12:42 PM _______________________________________________________________________  (1) Order result status: Final Collection or observation date-time: 05/23/2010 12:27 Requested date-time:  Receipt date-time:  Reported date-time:  Referring Physician:   Ordering Physician: Rob Bunting [phone]) Specimen Source:  Source: Launa Grill Order Number: [id number] Lab site:

## 2010-06-02 NOTE — Progress Notes (Signed)
[name], [name]                   ACCOUNT NO.:  [id number]  MEDICAL RECORD NO.:  [id number]           PATIENT TYPE:  I  LOCATION:  1428                         FACILITY:  [facility]  PHYSICIAN:  Hillery Aldo, M.D.   DATE OF BIRTH:  Dec 12, 1940                                PROGRESS NOTE   DATE OF DISCHARGE: Pending.  PRIMARY CARE PHYSICIAN: Lonzo Cloud. Kriste Basque, MD  GASTROENTEROLOGIST: Rachael Fee, MD  CURRENT DIAGNOSES: 1. Chronic obstructive pulmonary disease with acute exacerbation. 2. Acute blood loss anemia. 3. Gastrointestinal bleed secondary to gastritis and prepyloric ulcers     status post upper endoscopy. 4. Acute kidney injury. 5. Uncontrolled type 2 diabetes. 6. Hypertension. 7. Hyperlipidemia. 8. Morbid obesity. 9. Gastroesophageal reflux disease. 10.Osteoarthritis.  DISCHARGE MEDICATIONS: Will be dictated at the time of actual discharge.  CONSULTATIONS: Dr. Rob Bunting of Gastroenterology.  BRIEF ADMISSION HISTORY OF PRESENT ILLNESS: The patient is a 70 year old female who presented to the hospital with fever, chills, nausea, vomiting, diarrhea, and progressive dyspnea. Upon initial evaluation in the emergency department, the patient was noted to have a fever of 103.9 and subsequently was referred to the Hospitalist Service for further inpatient evaluation and treatment.  For the full details, please see the dictated report done by Dr. Blake Divine.  PROCEDURES AND DIAGNOSTIC STUDIES: 1. Chest x-ray on May 18, 2010, showed hyperaeration consistent with     COPD and stable cardiomegaly. 2. Right hip films on May 19, 2010, showed degenerative changes in     the hips with no evidence of acute bony abnormality. 3. Chest x-ray on May 21, 2010, showed no active cardiopulmonary     disease. 4. Upper endoscopy on May 23, 2010, showed 2 prepyloric ulcers at     low risk for rebleeding in the background of gastritis status post     biopsy to rule out H  pylori performed by Dr. Rob Bunting.  DISCHARGE LABORATORY VALUES: Will be dictated at the time of actual discharge.  HOSPITAL COURSE BY PROBLEM: 1. Chronic obstructive pulmonary disease with acute exacerbation:  The     patient was admitted and put on empiric antibiotic therapy as well     as Solu-Medrol.  She had dramatic improvement within 24 hours and     attempts to wean her from Solu-Medrol resulted in her deterioration     with ongoing bronchospasm at 48 hours.  The patient's Solu-Medrol     was titrated back up, she was continued on empiric antibiotics, and     provided with nebulized bronchodilator therapy with gradual     improvement in her respiratory status.  At this point, she has no     further bronchospasm and we are attempting to discontinue Solu-     Medrol today.  If she has recurrent bronchospasm off Solu-Medrol,     she will need to be on a prednisone taper; however, we would prefer     to avoid this if possible due to her gastric ulcers.  We will     monitor her closely over the next 24 hours to determine if  further    therapy with prednisone is necessary. 2. Acute blood loss anemia secondary to GI bleeding from gastritis and     prepyloric ulcers.  The patient was monitored closely and was noted     to have a steady drop in her hemoglobin over the course of her     hospital stay.  On May 22, 2010, the patient complained of     hematochezia and was noted to have a rise in her BUN and a 4.5 g     total drop in her hemoglobin from her admission values.  The     patient was put on proton pump inhibitor therapy and her aspirin     was discontinued.  Lovenox was also discontinued.  Because of     concerns for an upper GI source, GI consultation was requested for     possible endoscopy.  Dr. Christella Hartigan took the patient for upper     endoscopy with findings as noted above.  Recommendations are to     continue on twice daily PPI for the next 2 months, then decrease to      once daily.  The patient should avoid all nonsteroidal     antiinflammatory drugs that are not absolutely necessary.  Biopsies     were taken of her gastric mucosa and ulceration to rule out H     pylori infection and if this proves positive, she will need to be     started on appropriate eradication antibiotics.  She will follow up     with Dr. Christella Hartigan in 5-6 weeks with plans to repeat an outpatient     endoscopy to confirm ulcer healing.  Would continue to hold the     aspirin for at least 1 week. 3. Acute kidney injury:  The patient's baseline creatinine is 1.1.     Her Lasix has been on hold and she has been gently hydrated while     in the hospital.  We will continue to monitor her creatinine for     resolution. 4. Uncontrolled type 2 diabetes:  The patient's glycemic control has     been variable.  She has been monitored closely by the inpatient     diabetes program coordinator as well as by her treating physicians     and her insulin has been adjusted to achieve better glycemic     control. 5. Hypertension:  The patient is currently normotensive.  Her Lasix     remains on hold due to acute kidney injury.  The patient's other chronic medical problems have remained stable.  DISPOSITION: The patient is not yet medically stable for discharge.  We will monitor her over the next 24-48 hours for recurrence of bronchospasm and GI bleeding.  If she remained stable off of Solu-Medrol with no evidence of recurrent GI bleed, she can likely be discharged home in the next 24-48 hours. Discharge summary addendum will be dictated at that time.     Hillery Aldo, M.D.     CR/MEDQ  D:  05/23/2010  T:  05/24/2010  Job:  [id number]  cc:   Lonzo Cloud. Kriste Basque, MD [address] [place] [address]  Rachael Fee, MD [address] [place], [place] [address]  Electronically Signed by Hillery Aldo M.D. on 06/02/2010 07:08:33 AM

## 2010-06-05 ENCOUNTER — Other Ambulatory Visit: Payer: Self-pay | Admitting: Pulmonary Disease

## 2010-06-08 ENCOUNTER — Encounter: Payer: Self-pay | Admitting: Adult Health

## 2010-06-12 ENCOUNTER — Encounter: Payer: Self-pay | Admitting: Adult Health

## 2010-06-12 ENCOUNTER — Other Ambulatory Visit (INDEPENDENT_AMBULATORY_CARE_PROVIDER_SITE_OTHER): Payer: Medicare Other

## 2010-06-12 ENCOUNTER — Other Ambulatory Visit: Payer: Self-pay | Admitting: *Deleted

## 2010-06-12 ENCOUNTER — Ambulatory Visit (INDEPENDENT_AMBULATORY_CARE_PROVIDER_SITE_OTHER): Payer: Medicare Other | Admitting: Adult Health

## 2010-06-12 VITALS — BP 112/64 | HR 66 | Temp 97.5°F | Ht 64.5 in | Wt 191.0 lb

## 2010-06-12 DIAGNOSIS — D649 Anemia, unspecified: Secondary | ICD-10-CM

## 2010-06-12 DIAGNOSIS — J449 Chronic obstructive pulmonary disease, unspecified: Secondary | ICD-10-CM

## 2010-06-12 DIAGNOSIS — I10 Essential (primary) hypertension: Secondary | ICD-10-CM

## 2010-06-12 DIAGNOSIS — K922 Gastrointestinal hemorrhage, unspecified: Secondary | ICD-10-CM | POA: Insufficient documentation

## 2010-06-12 DIAGNOSIS — R748 Abnormal levels of other serum enzymes: Secondary | ICD-10-CM

## 2010-06-12 LAB — BASIC METABOLIC PANEL
BUN: 17 mg/dL (ref 6–23)
Calcium: 9.1 mg/dL (ref 8.4–10.5)
GFR: 45.9 mL/min — ABNORMAL LOW (ref >60.00)
Glucose, Bld: 238 mg/dL — ABNORMAL HIGH (ref 70–99)

## 2010-06-12 LAB — CBC WITH DIFFERENTIAL/PLATELET
Basophils Relative: 0.5 % (ref 0.0–3.0)
Eosinophils Relative: 1.3 % (ref 0.0–5.0)
HCT: 30 % — ABNORMAL LOW (ref 36.0–46.0)
Lymphs Abs: 1.4 10*3/uL (ref 0.7–4.0)
MCV: 90.9 fl (ref 78.0–100.0)
Monocytes Absolute: 0.7 10*3/uL (ref 0.1–1.0)
Neutro Abs: 3.4 10*3/uL (ref 1.4–7.7)
Platelets: 288 10*3/uL (ref 150.0–400.0)
RBC: 3.3 Mil/uL — ABNORMAL LOW (ref 3.87–5.11)
WBC: 5.5 10*3/uL (ref 4.5–10.5)

## 2010-06-12 MED ORDER — OLMESARTAN MEDOXOMIL 20 MG PO TABS: 20.0000 mg | ORAL_TABLET | Freq: Every day | ORAL | Status: DC

## 2010-06-12 NOTE — Assessment & Plan Note (Signed)
Recent exacerbation now improved  Cont on current regimen.

## 2010-06-12 NOTE — Assessment & Plan Note (Signed)
Recent GIB  Remain of Boniva and NSAIDS  Plan  Cont follow up with GI as planned  Labs with cbc today

## 2010-06-12 NOTE — Patient Instructions (Signed)
Avoid ACE inhibitors in future Continue Benicar 20mg   Daily for blood pressure (this replaced Lisinopril).  Mucinex DM Twice daily   follow up Dr. Kriste Basque in 2 months and As needed   Please contact office for sooner follow up if symptoms do not improve or worsen or seek emergency care

## 2010-06-12 NOTE — Progress Notes (Signed)
  Subjective:    Patient ID: [name], female    DOB: December 07, 1940, 70 y.o.   MRN: [id number]  HPI 70 yo female with known hx of HTN, CAD, GERD and COPD  05/29/2010 ACUTE OV: Presents for work in visit. She was recently admitted 3/15-3/22 for GI bleeding w/ gastritis and prepyloric ulcer s/p endo. Seen by GI tx w/ PPI , etodolac, asa, boniva stopped. Started on amoxicillin and clindamycin today (for H.Pylori). She also had COPD exacerbation during her admission in which she was treated with steroids, neb and avelox. Discharged on avelox and steroid taper. She is now finished with however cough and congestion have not resolved. SHe is using mucinex with some help. Cough is worse at night keeping her up. CXR last week with no acute changes. Previously her ACE was stopped due to cough however restarted due to cost issues (was changed to ARB). ~ baseline CXR = COPD, incr heart size, NAD. >>Dx with slow to resolve COPD exacerbation , recs to chagne ACE to ARB and finish steroid taper.   06/12/2010 Follow up OV- She returns today much improved with decreased cough/congestion. He had slow to resolve COPD exacerbation from recent hospitalization for GIB and COPD flare.  Finished Steroid taper and antibiotics.  Her ACE inhibitor was stopped and changed to ARB. She is tolerating Benicar .  Does have some congestion but clear. Still feels very tired and low energy.  Her stomach is feeling better . NO bleeding.   Review of Systems Constitutional:   No  weight loss, night sweats,  Fevers, chills, fatigue, or  lassitude.  HEENT:   No headaches,  Difficulty swallowing,  Tooth/dental problems, or  Sore throat,                No sneezing, itching, ear ache  CV:  No chest pain,  Orthopnea, PND, swelling in lower extremities, anasarca, dizziness, palpitations, syncope.   GI  No heartburn, indigestion, abdominal pain, nausea, vomiting, diarrhea, change in bowel habits, loss of appetite, bloody stools.   Resp: ,  No  coughing up of blood.  No change in color of mucus.  No wheezing.  No chest wall deformity  Skin: no rash or lesions.  GU: no dysuria, change in color of urine, no urgency or frequency.  No flank pain, no hematuria   MS:  No joint pain or swelling.  No decreased range of motion.  No back pain.  Psych:  No change in mood or affect. No depression or anxiety.  No memory loss.      Objective:   Physical Exam GEN: A/Ox3; pleasant , NAD, morbidly obese   HEENT:  [place]/AT,  EACs-clear, TMs-wnl, NOSE-clear, THROAT-clear, no lesions, no postnasal drip or exudate noted.   NECK:  Supple w/ fair ROM; no JVD; normal carotid impulses w/o bruits; no thyromegaly or nodules palpated; no lymphadenopathy.  RESP  Coarse BS w/ no wheezing noted , few scattered rhonchi  CARD:  RRR, no m/r/g  , no peripheral edema, pulses intact, no cyanosis or clubbing.  GI:   Soft & nt; nml bowel sounds; no organomegaly or masses detected, morbidly obese abd.   Musco: Warm bil, no deformities or joint swelling noted.   Neuro: alert, no focal deficits noted.    Skin: Warm, no lesions or rashes         Assessment & Plan:

## 2010-06-12 NOTE — Assessment & Plan Note (Signed)
No active bleeding. Now finished rx for H. Pylori.   Plan follow up GI as planned Cont off Boniva and NSAIDs Labs today

## 2010-06-12 NOTE — Assessment & Plan Note (Signed)
Controlled on rx. -ARB intiolerant to ACE inhibitor   Plan:  Cont on [employer] with bmet

## 2010-06-13 NOTE — Discharge Summary (Signed)
[name], [name]                   ACCOUNT NO.:  [id number]  MEDICAL RECORD NO.:  [id number]           PATIENT TYPE:  I  LOCATION:  1428                         FACILITY:  [facility]  PHYSICIAN:  Altha Harm, MDDATE OF BIRTH:  1940-10-05  DATE OF ADMISSION:  05/18/2010 DATE OF DISCHARGE:  05/25/2010                              DISCHARGE SUMMARY   PRIMARY CARE PHYSICIAN:  Lonzo Cloud. Kriste Basque, M.D.  GASTROENTEROLOGIST:  Rachael Fee, M.D.  DISCHARGE DISPOSITION:  Home.  FINAL DISCHARGE DIAGNOSES: 1. Chronic obstructive pulmonary disease with acute exacerbation,     resolves. 2. Acute blood loss anemia. 3. Helicobacter pylori gastritis. 4. Gastrointestinal bleeding secondary to gastritis and prepyloric     ulcer, status post endoscopy. 5. Acute kidney injury, resolved. 6. Chronic kidney disease, stage II. 7. Uncontrolled diabetes type 2. 8. Hypertension. 9. Hyperlipidemia. 10.Morbid obesity. 11.Gastroesophageal reflux disease. 12.Osteoarthritis.  DISCHARGE MEDICATIONS: 1. Hydrocortisone 2.5% Anusol-HC cream one application rectally twice     daily. 2. Protonix 40 mg p.o. b.i.d. with meal. 3. Xanax 0.25 mg to 0.5 mg p.o. t.i.d. p.r.n. anxiety. 4. Artificial tears 1 drop in each eye b.i.d. as needed for dry eyes. 5. Atenolol 50 mg p.o. daily. 6. DuoNeb 2.5 mg 1 vial inhale daily 4 times a day. 7. Iron sulfate 325 mg p.o. daily. 8. Fish oil 1000 mg p.o. daily. 9. Flonase intranasal spray 2 sprays inhale daily. 10.Lasix 40 mg p.o. daily. 11.Insulin 70/30 34 units in the morning and 26 units in the evening     subcutaneously. 12.Ipratropium bromide nebulized 0.5 mg 4 times a day. 13.Lisinopril 20 mg p.o. daily. 14.Mucinex DM 1 tablet p.o. b.i.d. 15.Nitroglycerin sublingual 0.4 mg sublingual every 5 minutes up to 3     times as needed for chest pain. 16.Os-Cal with vitamin D 1 tablet p.o. b.i.d. 17.Pulmicort 0.25 mg inhaled q.i.d. 18.Vitamin B12 500 mg p.o.  daily. 19.Zocor 80 mg p.o. daily.  Medications on hold at this time, include the following, 1. Etodolac 400 mg q.12 h. 2. Aspirin enteric-coated 81 mg p.o. daily. 3. Metformin 500 mg p.o. b.i.d. 4. Boniva 150 mg p.o. monthly.  Please note that the patient is positive for H pylori.  The patient will be on a Prevpac, which will be supplied via samples by Dr. Christella Hartigan' office.  This was then confirmed by Dr. Christella Hartigan' PA, Ms. Tiburcio Pea.  CONSULTANTS:  Dr. Christella Hartigan.  PROCEDURES:  EGD with biopsy.  DIAGNOSTIC STUDIES: 1. Two-view chest x-ray, which shows hyperaeration consistent with     COPD.  Impression, stable cardiomegaly. 2. X-ray of the right hip, which shows degenerative changes in the     hip.  There is no evidence of acute bony abnormality. 3. Portable chest x-ray one view, which shows no active     cardiopulmonary disease.  CODE STATUS:  Full code.  ALLERGIES: 1. CODEINE 2. TERBUTALINE.  CHIEF COMPLAINT:  Fever, chills, nausea, vomiting, diarrhea for 3 days.  HISTORY OF PRESENT ILLNESS:  This is a 70 year old female who presented to the hospital with fever, chills, nausea, vomiting, diarrhea, and progressive dyspnea.  Upon  initial evaluation in the emergency room, the patient was noted to have a fever of 103.9 and subsequently was referred to the hospitalist service for further inpatient evaluation and treatment.  For full details of the H and P, please see to the dictated report by Dr. Blake Divine.  HOSPITAL COURSE: 1. COPD with acute exacerbation:  The patient was admitted and placed     on empiric antibiotics with Avelox as well as Solu-Medrol.  The     patient had a dramatic improvement in 24 hours and attempts were     made to wean her off the Solu-Medrol.  This resulted deterioration     with ongoing bronchospasm at 48 hours.  The patient's Solu-Medrol     was restarted and titrated back up and she was continued on empiric     antibiotics, which she has completed.   The patient was provided     with nebulized bronchodilator therapy and gradually improved in her     respiratory status.  At present, the patient has no further     bronchospasm and the Solu-Medrol has been discontinued for greater     than 24 hours.  The patient at this point does not need to be on     prednisone taper as she has had no rebound, bronchospasm, off the     steroids.  Additionally, the patient is at great risk of GI     bleeding with the ulcers and biopsies and thus needs to avoid     prednisone at this time as much as possible.  The patient has a     baseline oxygen requirement of 3 L of oxygen and is presently at     her baseline.  The patient was seen by physical therapy who was     recommended the patient should have home health physical therapy at     home to improve her endurance.  The patient states that she has     been on approximately 3 L of oxygen for more than 3 years. 2. Prepyloric ulcers and gastritis leading to GI bleed in acute blood     loss anemia:  The patient was monitored closely and is noted to     have a steady drop in her hemoglobin over the course of her     hospital stay.  On May 22, 2010, the patient complained of     hematochezia and was noted to have a rise in her BUN and 4.5 g     total drop in her hemoglobin from her admission values.  The     patient was put on proton pump inhibitor and her aspirin, etodolac     were discontinued.  The patient had done on the Lovenox for DVT     prophylaxis and this was also discontinued due to the concerns of     this being a GI source.  [facility] was consulted.     The patient was seen in consultation by Dr. Rob Bunting who     performed an EGD.  The patient was found to have prepyloric ulcers     and gastritis.  Additionally, biopsies were done for H pylori and     the patient was found to be positive for H pylori.  Recommendations     are to continue the proton pump inhibitor on a  b.i.d. basis for the     next 2 months and then taper to once daily.  The patient  should     also avoid all nonsteroidal, blood thinners, or antiplatelet     agents.  I would also recommend that the patient should also     discontinue the use of her Boniva given propensity for esophageal     and gastric ulceration.  In terms of her a positive H  pylori, I     spoken with the PA from [facility], Ms. Tiburcio Pea for discussion     and starting the patient on H pylori eradication regimen.  She is     asked that we do not start the patient on any medications here in     the hospital and that her office will provide a Prevpac for the     patient for H pylori eradication.  She states that she will also     see the patient and arrange a follow up appointment, which     originally was placed at 3-5 weeks and at this point will likely     have to be sooner than that. 3. Acute kidney injury.  The patient's baseline creatinine of 1.1.     Her Lasix was on hold and she has been gently hydrated while in the     hospital.  At the time of discharge, the patient's creatinine is     1.3.  Her Lasix has been restarted due to her cardiac history. 4. Uncontrolled diabetes type 2:  The patient's glycemic control has     been variable.  The patient is being monitored closely by the     inpatient diabetic coordinator.  The patient had been on metformin     prior to hospitalization, but due to her are renal insufficiency     with a creatinine clearance of only 43.  Her metformin is being     held at this time.  The patient will likely need further titration     of her insulin to adequately control her blood sugars.  I will     defer to her primary care physician Dr. Kriste Basque to further titrate     those medications.  In terms of her hypertension, the patient was     restarted on her atenolol and her Lasix.  Her blood pressures are     well controlled and her heart rate remains in good control of 68.  LABORATORY  DATA:  Laboratories at the time of discharge include the following hemoglobin of 9.3, hematocrit of 29.6, sodium 142, potassium 4.0, chloride 104, bicarbonate 33, BUN 20, creatinine 1.37. During this hospitalization, hemoglobin A1c shown to be 9.9 corresponding to a blood glucose of 237.  This represents poor control of her blood sugar on a chronic basis.  At the time of her discharge, the patient's condition is stable.  PHYSICAL EXAMINATION:  GENERAL:  The patient is well appearing. VITAL SIGNS:  Temperature is 98.6, heart rate 68, blood pressure 116/76, respiratory rate 16, O2 saturations are 96% on 3 L of oxygen.  Her CBG is ranging in the 130s to low 200s. HEENT:  She is normocephalic, atraumatic.  Pupils equally round and reactive to light and accommodation.  Extraocular movements are intact. Oropharynx is moist.  No exudate, erythema, or lesions are noted. NECK:  Trachea is midline.  No masses.  No thyromegaly.  No JVD.  No carotid bruit. LUNGS:  Clear to auscultation.  No wheezing or rhonchi noted. CARDIOVASCULAR:  She has got normal S1 and S2.  No murmurs, rubs, or gallops  are noted.  PMI is nondisplaced.  No heaves or thrills on palpation. ABDOMEN:  Obese, soft, nontender, and nondistended.  No masses.  No hepatosplenomegaly is noted. EXTREMITIES:  No clubbing, cyanosis, or edema. PSYCHIATRIC:  She is alert and oriented x3.  Good insight and cognition. Good recent and remote recall.  DTRs are 2+ bilaterally in the upper and lower extremities. NEUROLOGIC:  The patient has no focal neurological deficits noted.  She has movement again gravity equally in bilateral upper extremities and some left to right.  DISPOSITION:  The patient is medically stable for discharge.  The patient will be discharged home with home health physical therapy and nursing for disease management.  The patient should make a followup appointment with Dr. Kriste Basque for 3 to 6 days, particularly with  titration of her insulin for her blood glucose.  Additionally, the patient will follow up with Dr. Christella Hartigan with an appointment that will be set by Ms. Milas Gain before the patient leaves the hospital.  Time spent coordinating care for discharge and discharge instructions including face-to-face time approximately 45 minutes.     Altha Harm, MD     MAM/MEDQ  D:  05/25/2010  T:  05/26/2010  Job:  [id number]  cc:   Lonzo Cloud. Kriste Basque, MD [address] [place] [address]  Rachael Fee, MD [address] [place], [place] [address]  Electronically Signed by Marthann Schiller MD on 06/13/2010 08:19:39 PM

## 2010-06-21 ENCOUNTER — Other Ambulatory Visit: Payer: Self-pay | Admitting: *Deleted

## 2010-06-21 MED ORDER — LOSARTAN POTASSIUM 50 MG PO TABS: 50.0000 mg | ORAL_TABLET | Freq: Every day | ORAL | Status: DC

## 2010-06-21 MED ORDER — MAGIC MOUTHWASH: ORAL | Status: DC

## 2010-06-21 NOTE — Telephone Encounter (Signed)
Received fax from [facility], stating that pt's benicar 20mg  is not covered by inusrance and not eligible for prior auth.  Called [facility] spoke with Marisue Humble, who reports that cozaar is covered.  Per TP, okay to change to cozaar 50mg  #30, 1 by mouth once daily with PRN refills.  This was verbally given to [facility].  Updated med list in Epic.

## 2010-07-03 ENCOUNTER — Ambulatory Visit (INDEPENDENT_AMBULATORY_CARE_PROVIDER_SITE_OTHER): Payer: Medicare Other | Admitting: Gastroenterology

## 2010-07-03 ENCOUNTER — Encounter: Payer: Self-pay | Admitting: Gastroenterology

## 2010-07-03 ENCOUNTER — Telehealth: Payer: Self-pay | Admitting: Pulmonary Disease

## 2010-07-03 DIAGNOSIS — K259 Gastric ulcer, unspecified as acute or chronic, without hemorrhage or perforation: Secondary | ICD-10-CM

## 2010-07-03 NOTE — Progress Notes (Signed)
Review of pertinent gastrointestinal problems: 1. prepyloric ulcers, H. pylori positive.  Noted by EGD March 2012 when patient presented with melena, anemia.   HPI: This is a very pleasant 70 year old woman Whom I last saw about a month ago.  FAtigued, slowly improveing.  She completed the h. Pylori Abx.  She has light brown solid stool every day.  No overt GI bleeding.  Sometimes sore in abd but not worse than usual.  I performed EGD for her when she was hospitalized, see those results summarized above. She completed her H. pylori antibiotics.     Physical Exam: Ht 5\' 4"  (1.626 m)  Wt 209 lb (94.802 kg)  BMI 35.87 kg/m2 Constitutional: Chronically ill-appearing, she is on nasal cannula oxygen COPD Lungs; poor air movement Psychiatric: alert and oriented x3 Abdomen: soft, nontender, nondistended, no obvious ascites, no peritoneal signs, normal bowel sounds    Assessment and plan: 70 y.o. female with  H. pylori positive gastric ulcers 5 weeks ago while hospitalized  We will schedule her for repeat EGD late May or early June to confirm gastric ulcer healing.

## 2010-07-03 NOTE — Telephone Encounter (Signed)
lmomtcb x1 

## 2010-07-03 NOTE — Telephone Encounter (Signed)
Pt phoned stated that she was returning a call to triage she can be reached [phone].Vedia Coffer

## 2010-07-03 NOTE — Telephone Encounter (Signed)
Spoke w/ pt and she states after insurance she would have to pay $98 for her benicar. Pt states she can't afford that and needs an alternative. Pt states she wants the generic of what Dr. Kriste Basque wants her changed to. Please advise Dr. Kriste Basque. Thanks  Allergies  Allergen Reactions  . Codeine   . Terbutaline Sulfate     REACTION: INTOL to terbutaline    Carver Fila, CMA

## 2010-07-03 NOTE — Patient Instructions (Signed)
You will be set up for an upper endoscopy at [facility] to check for healing of gastric ulcer, late May or early June. You should stay on the omeprazole once daily to help decrease chances of ulcer returning.

## 2010-07-04 ENCOUNTER — Other Ambulatory Visit: Payer: Self-pay | Admitting: Pulmonary Disease

## 2010-07-04 MED ORDER — FUROSEMIDE 40 MG PO TABS: 40.0000 mg | ORAL_TABLET | Freq: Every day | ORAL | Status: DC

## 2010-07-04 NOTE — Telephone Encounter (Signed)
Pt aware according to 4/18 phone note benicar was changed to losartan. Pt aware of this. Pt also states she needs rx for lasix sent to pharmacy. i advised her I will send rx for her. Nothing further was needed

## 2010-07-18 NOTE — Assessment & Plan Note (Signed)
[facility]                       [facility] CARDIOLOGY OFFICE NOTE   [name], [name]                          MRN:          [id number]  DATE:11/07/2007                            DOB:          1940/05/28    PRIMARY CARE PHYSICIAN:  Lonzo Cloud. Kriste Basque, MD   REASON FOR VISIT:  Cardiac followup.   HISTORY OF PRESENT ILLNESS:  I saw [name] back in February 2008.  She  has a history of cardiovascular disease, status post previous anterior  wall myocardial infarction with subsequent intervention to the left  anterior descending including stent placement and subsequent cutting  balloon angioplasty with brachytherapy.  She has been doing fairly well  and had a followup dobutamine Myoview in July 2008, which demonstrated  moderate anteroapical anteroseptal scar without ischemia and an ejection  fraction of 53%.  Echocardiography around that same time also showed an  ejection fraction of 55%.  Symptomatically, [name] denies and any  problems with angina.  She has not had to use any nitroglycerin.  She  has NYHA class II to III, dyspnea on exertion with chronic obstructive  pulmonary disease requiring home oxygen particularly with exertion.  Her  electrocardiogram is stable showing sinus rhythm with evidence of  previous anterior wall infarction and nonspecific ST-T wave changes.   LABORATORY DATA:  From July 2009 shows LDL of 117, HDL of 40 and normal  liver function tests.  Her medicines are outlined below.   ALLERGIES:  CODEINE.   PRESENT MEDICATIONS:  1. Flonase 2 puffs daily.  2. Lisinopril 10 mg daily.  3. Lasix 40 mg daily.  4. Protonix 40 mg daily.  5. Atenolol 50 mg daily.  6. Avandia 4 mg daily.  7. Xanax 0.5 mg 1/2-1 tablet t.i.d.  8. Boniva 150 mg  monthly.  9. Os-Cal with vitamin D 500 mg daily.  10.Aspirin 81 mg daily.  11.Glucophage 500 mg b.i.d.  12.Zocor 40 mg nightly.  13.Humulin 70/30 20 units subcu q.a.m. and 15 units subcu  q.p.m.  14.Mucinex DM 2 tablets b.i.d.  15.Ferrous sulfate 325 mg daily.  16.P.r.n. nitroglycerin sublingual.   REVIEW OF SYSTEMS:  As outlined above.  No palpitations or syncope.  No  orthopnea or PND.  No problems with lower extremity edema for  claudication.  Otherwise negative.   PHYSICAL EXAMINATION:  VITAL SIGNS:  Blood pressure is 110/72, heart  rate is 67, weight is 244 pounds.  GENERAL:  She is an obese woman in no acute distress.  HEENT:  Conjunctivae lids normal.  Oropharynx clear.  NECK:  Supple.  No elevated jugular venous pressure.  No audible bruits.  No thyromegaly.  LUNGS:  Clear with diminished breath sounds throughout.  No active  wheezing noted.  CARDIAC:  Regular rate and rhythm with soft systolic murmur.  Preserved  second heart sound.  No S3 gallop.  ABDOMEN:  Obese, no obvious hepatomegaly.  Bowel sounds present.  EXTREMITIES:  Trace edema.  Distal pulses 1+.  SKIN:  Warm and dry.  MUSCULOSKELETAL:  No kyphosis noted.  NEUROPSYCHIATRIC:  The patient is alert  x3.  Affect is appropriate.   IMPRESSION AND RECOMMENDATIONS:  1. Cardiovascular disease, status post previous anterior wall      myocardial infarction managed with stent placement to left anterior      descending and subsequent cutting balloon angioplasty with      brachytherapy due to in-stent restenosis.  Followup Myoview in July      of last year was stable and [name] is denying any active angina.      She has chronic dyspnea on exertion principally related to her lung      disease and this has also been stable.  We will plan to continue      present medical therapy with the exception of increasing Zocor to      80 mg in the evening to attain better LDL control.  She will have      followup liver function and lipids over the next 12 weeks.  2. Followup will be over the next 6 months.     Jonelle Sidle, MD  Electronically Signed    SGM/MedQ  DD: 11/07/2007  DT: 11/08/2007  Job #:  [id number]   cc:   Lonzo Cloud. Kriste Basque, MD

## 2010-07-18 NOTE — H&P (Signed)
[name], Becker NO.:  [id number]   MEDICAL RECORD NO.:  [id number]          PATIENT TYPE:  EMS   LOCATION:  MAJO                         FACILITY:  [facility]   PHYSICIAN:  Jonelle Sidle, MD DATE OF BIRTH:  [birth year]   DATE OF ADMISSION:  08/29/2006  DATE OF DISCHARGE:                              HISTORY & PHYSICAL   PRIMARY CARDIOLOGIST:  Jonelle Sidle, M.D.   PRIMARY CARE PHYSICIAN:  Lonzo Cloud. Kriste Basque, M.D.   HISTORY OF PRESENT ILLNESS:  [name] is a very pleasant 70 year old  Caucasian female with a known history of coronary artery disease status  post anterior wall MI in 2000 treated with angioplasty.  At that time,  she also had placement of a stent in the mid LAD.  The patient did well  initially and then had recurrent angina.  She underwent a repeat cardiac  catheterization showing restenosis of the proximal segment of the  stented area in the LAD with slight extension of the native vessel at  the take-off the diagonal branch.  She underwent percutaneous  intervention and brachytherapy in 2001 secondary to in-stent restenosis.  The patient has done well since that time.  She stated she has maybe  taken 2 nitroglycerin 2-3 times over the last 4 years until this  Tuesday.  She states she woke up Tuesday morning.  After she got up and  moved around, she began having sharp stabbing pain that was fleeting  under her left breast.  She states it continued intermittently all day.  She did not have any associated symptoms with it.  She is chronically  short of breath secondary to her COPD.  She did not have any increased  shortness of breath associated with chest discomfort.  Tuesday night she  went to bed, slept fine, no problems.  Wednesday she got up and moved  around.  The chest discomfort returned.  It lasted 2-3 hours with  intermittent, fleeting, sharp, stabbing pain again.  It then resolved.  No further episodes.  This morning she got up,  moved around, same  scenario.  She called our office in [employer].  She was told to take  nitroglycerin.  She states she took 2 with relief of chest discomfort.  No further episodes.  She called the office back to let them know.  They  encouraged her to come to the emergency room for further evaluation. She  states she did not have any further chest discomfort until she was en  route to the hospital.  She had the same symptoms of sharp stabbing  discomfort.  It is not associated with any lightheadedness, dizziness,  nausea, vomiting or diaphoresis.  She states it sometimes seems worse if  she takes a deep breath or takes a breath too fast if she is moving  around.  It is not associated with any position or activity.  However,  she is very tender to touch around her left breast.  She states her CBGs  have been running 70-150 at home.  Otherwise, everything has been normal  routine  for her.  She has not recently traveled and has not complained  of any palpitations.  No recent injuries.   ALLERGIES:  CODEINE, BRETHINE.   MEDICATIONS:  Her medication list is quite lengthy.  It includes:  1. Pulmicort nebulizers b.i.d.  2. DuoNebs q.i.d.  3. Flonase 2 puffs daily.  4. Lisinopril 10 mg daily.  5. Furosemide 40 mg daily.  6. Protonix 40 mg daily.  7. Atenolol 50 mg daily.  8. Avandia 4 mg b.i.d.  9. Xanax 0.5 mg, 1/2 tablet to 1 tablet p.o. t.i.d. p.r.n.  10.Darvocet 1 every 4 hours p.r.n.  11.Boniva once a month.  12.Os-Cal with vitamin D daily.  13.Aspirin 81 mg daily.  14.Glucophage 500 mg b.i.d.  15.Zocor 40 mg daily.  16.Nitroglycerin p.r.n.  17.Humulin insulin 70/30, 20 units in the morning, 15 units in the      evening.  18.Mucinex twice a day p.r.n.  19.Benadryl p.r.n.  20.Extra strength Tylenol p.r.n.  21.Ferrous sulfate 325 mg daily.  22.Etodolac 400 mg b.i.d. as needed.   PAST MEDICAL HISTORY:  Includes:  1. Hypertension.  2. 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  3. COPD (O2 dependent).  4. Diabetes (insulin dependent).  5. Arthritis.  6. Hyperlipidemia.  7. GERD.  8. Obesity.  9. Status post cholecystectomy.   SOCIAL HISTORY:  The patient lives in [place] alone.  She is a widow.  She has 4 adult kids.  She lost a son last year, age 16, secondary to  acute MI.  She quit using tobacco products in 2000 after an MI.  She  tries to follow an ADA diet.  Denies any drugs, herbal medication,  alcohol, or tobacco use.   FAMILY HISTORY:  Positive for MI in father (cause of death), siblings  with known CAD.   REVIEW OF SYSTEMS:  Positive for chest pain, shortness of breath,  dyspnea on exertion, occasional palpitations, coughing, occasional  wheezing, myalgia, arthralgia, pain in legs, hips, shoulders and knees,  GERD symptoms.   PHYSICAL EXAMINATION:  VITAL SIGNS:  Temperature 97.6, respirations 22,  pulse 58 and regular, blood pressure 137/54, O2 saturation 95% on 2  liters nasal cannula.  GENERAL:  In no acute distress.  HEENT:  Head normocephalic, atraumatic.  Pupils equal, round, and  reactive to light.  Sclerae clear.  Mucous membranes are moist.  NECK:  Supple without lymphadenopathy.  No bruit.  No JVD.  CARDIOVASCULAR:  Exam reveals S1 and S2.  The left chest wall is tender  to palpation.  LUNGS:  Clear to auscultation.  Very distant throughout.  SKIN:  Warm and dry.  ABDOMEN:  Soft, nontender, positive bowel sounds.  Obese.  LOWER  EXTREMITIES: Without clubbing, cyanosis or edema.  Positive pedal  pulses.  NEUROLOGIC:  Alert and oriented x3.   STUDIES:  Chest x-ray interpretation is pending.  The patient does have  obvious cardiomegaly.  EKG:  Sinus bradycardia at a rate of 58.  PACs.  No acute ST or T-wave changes.  LABORATORIES:  I-STAT with creatinine:  Sodium 138, potassium 4.4, BUN  29, creatinine 1.1, glucose  77.  H&H 13.3/39.   ASSESSMENT:  Dr. [name] was in to examine and assess this  patient with a history of known coronary artery disease and multiple  risk factors complaining of chest pain under her left  breast for 3 days,  somewhat atypical.   PLAN:  The patient will be admitted for 23-hour observation.  Cycle  cardiac markers.  If negative, plan on outpatient Myoview.  We will  proceed with a 2-D echocardiogram here secondary to cardiomegaly and  questionable effusion.  We will check a D-dimer also.      Dorian Pod, ACNP      Jonelle Sidle, MD  Electronically Signed    MB/MEDQ  D:  08/29/2006  T:  08/29/2006  Job:  [id number]

## 2010-07-18 NOTE — Discharge Summary (Signed)
NAME:  [name], [name]                   ACCOUNT NO.:  [id number]   MEDICAL RECORD NO.:  [id number]          PATIENT TYPE:  INP   LOCATION:  3703                         FACILITY:  [facility]   PHYSICIAN:  Everardo Beals. Juanda Chance, MD, FACCDATE OF BIRTH:  November 15, 1940   DATE OF ADMISSION:  08/29/2006  DATE OF DISCHARGE:  08/30/2006                               DISCHARGE SUMMARY   PRIMARY CARDIOLOGIST:  Dr. Simona Huh.   PRIMARY CARE Jalonda [place]:  Dr. Alroy Dust.   DISCHARGE DIAGNOSIS:  Atypical chest pain.   SECONDARY DIAGNOSES:  1. Coronary artery disease.      a.     Status post myocardial infarction in 2000.      b.     Status post PTCA in 2000, 2001, and 2002.  2. Hypertension.  3. Hyperlipidemia.  4. Type 2 diabetes mellitus.  5. Morbid obesity.  6. Gastroesophageal reflux disease.  7. Chronic obstructive pulmonary disease on p.r.n. O2 at home.  8. Osteoarthritis.  9. Status post cholecystectomy.  10.Remote tobacco abuse.   ALLERGIES:  CODEINE and BRETHINE.   PROCEDURES:  2-D echocardiogram.   HISTORY OF PRESENT ILLNESS:  A 70 year old Caucasian female with prior  history of CAD as above who was in her usual state of health until  approximately two days prior to admission when she began to experience  intermittent sharp, fleeting chest discomfort occurring into the left  breast without associated symptoms.  Symptoms remained intermittent over  the subsequent two days and recurred on June 26 prompting her to present  to the [facility] ED for further evaluation.  She noted that symptoms  were worse with deep breathing, palpation, and rotation of the left  shoulder.  In the ED ECG showed no acute changes, and she was admitted  for further evaluation.   HOSPITAL COURSE:  The patient ruled out for MI, and ECG remained stable.  She has had some sharp shooting discomfort as described in the HPI.  However, note that symptoms have occurred less frequently.  She is,  otherwise, in good spirits  and feels well.  She is ambulating without  difficulty.  Her D-dimer is also normal.  She underwent 2-D  echocardiogram this morning.  At this time the readout on that is  currently pending.  We have arranged for discharge this afternoon with  plans for an outpatient adenosine Myoview on July 8 at 8:15 a.m.   DISCHARGE LABORATORIES:  Hemoglobin 11.7, hematocrit 35.4, WBC 6.7,  platelets 211.  PT 12.3, INR 0.9.  D-dimer 0.24.  Sodium 138, potassium  4.4, chloride 101, CO2 30, BUN 27, creatinine 1.03, glucose 65.  Total  bilirubin 0.6, alkaline phosphatase 49.  AST 17, ALT 18.  Total protein  6.6, albumin 3.6.  Calcium 8.9.  CK 109, MB 3.1, troponin I 0.01.   DISPOSITION:  The patient is being discharged home today in good  condition.   FOLLOW-UP APPOINTMENTS:  She has follow-up adenosine Myoview on July 8  at 8:15 a.m.  She will follow up with Dr. Diona Browner, nurse practitioner,  on July 11 at  11:45 a.m.  She is set up to follow up with Dr. Kriste Basque as  previously scheduled.   DISCHARGE MEDICATIONS:  1. Aspirin 81 mg daily.  2. Lisinopril 10 mg daily.  3. Atenolol 50 mg daily.  4. Lasix 40 mg daily.  5. Protonix 40 mg daily.  6. Pulmicort nebulizer b.i.d.  7. Flonase 2 puffs each nostril b.i.d.  8. Avandia 4 mg b.i.d.  9. DuoNeb inhaler q.i.d.  10.Glucophage 500 mg b.i.d.  11.Zocor 40 mg q.h.s.  12.Nitroglycerin 0.4 mg sublingual p.r.n. chest pain.  13.70/30 insulin 20 units in the a.m., 15 units in the p.m.  14.Ferrous sulfate 325 mg daily.  15.Xanax, Darvocet, Boniva, and Os-Cal as previously prescribed.   OUTSTANDING LABORATORY STUDIES:  None.   Duration discharge encounter 45 minutes including physician time.      Nicolasa Ducking, ANP      Bruce R. Juanda Chance, MD, [facility]  Electronically Signed    CB/MEDQ  D:  08/30/2006  T:  08/30/2006  Job:  [id number]   cc:   Lonzo Cloud. Kriste Basque, MD

## 2010-07-21 NOTE — Discharge Summary (Signed)
[place]  Patient:    [name], [name]                          MRN: [id number] Adm. Date:  [id number] Disc. Date: [id number] Attending:  Verdene Lennert                           Discharge Summary  DATE OF BIRTH:  07/21/1940  FINAL DIAGNOSES:  1. Severe refractory asthmatic bronchitis/chronic obstructive pulmonary     disease, poorly responsive to outpatient therapy, improved with     intravenous medication, chest physiotherapy, and bronchoscopy with lavage.  2. Coronary artery disease with previous anterior wall myocardial infarction     in May of 2000 with stent placement.  Followed by Arturo Morton Riley Kill, M.D.,     and Veneda Melter, M.D., with history of stent restenosis and intravascular     brachytherapy by Jackelyn Knife, M.D.  3. Hypertension.  4. Hyperlipidemia on Zocor.  5. Insulin-dependent diabetes mellitus.  6. Status post laparoscopic cholecystectomy in 1996.  7. History of gastroesophageal reflux disease, improved on proton pump     inhibitor therapy by Barry Dienes. Eloise Harman, M.D.  8. History of constipation.  9. Venous insufficiency. 10. Degenerative arthritis and previous right rotator cuff surgery. 11. Anxiety and fatigue on alprazolam. 12. Status post hysterectomy.  BRIEF HISTORY AND PHYSICAL:  The patient is a 70 year old white female with multiple medical problems as listed above.  She has underlying chronic obstructive pulmonary disease and asthmatic bronchitis and insulin-dependent diabetes.  She had a subacute exacerbation of her COPD over several weeks prior to admission, characterized by increasing shortness of breath, cough, beige sputum production, and chest congestion with wheezing, etc.  This was unresponsive to outpatient management, including a brief course of oral Medrol, Avelox, and adjustment in her metered dose inhalers.  She was admitted for further evaluation, IV therapy, IV Solu-Medrol, and further evaluation  for possible underlying causes.  PAST MEDICAL HISTORY:  Besides her history of underlying COPD and asthmatic bronchitis (she is an ex-smoker), she has a history of coronary artery disease with previous anterior wall myocardial infarction in May of 2000 treated with a stent by Arturo Morton. Riley Kill, M.D., and Veneda Melter, M.D.  She had normal LV function at that time.  She developed an end-stage restenosis that was treated with intravascular brachytherapy by Jackelyn Knife, M.D.  She has a history of hypertension controlled on medications.  She has a history of hyperlipidemia controlled on Zocor.  She has a history of insulin-dependent diabetes mellitus with fair control on an outpatient basis.  She had a previous laparoscopic cholecystectomy in 1996.  She has a history of reflux and constipation.  She has venous insufficiency with mild peripheral edema.  She has a history of degenerative arthritis and previous right rotator cuff surgery.  She has a history of chronic anxiety and fatigue and takes alprazolam as needed.  She has had a remote hysterectomy.  PHYSICAL EXAMINATION:  The physical examination at the time of admission revealed a 70 year old white female in mild distress secondary to dyspnea.  VITAL SIGNS:  Blood pressure 144/80, pulse 72 per minute and regular, respirations 22 and not labored, temperature 98 degrees.  HEENT:  Edentulous.  There were no lesions seen.  NECK:  No jugular venous distention.  No carotid bruits.  No thyromegaly or lymphadenopathy.  CHEST:  Bilateral expiratory wheezing  and rhonchi without signs of consolidation.  CARDIAC:  Exam revealed regular rhythm and a grade 1/6 systolic ejection murmur at the left sternal border.  No rubs or gallops were heard.  ABDOMEN:  Some mild epigastric discomfort on palpation.  No evidence of organomegaly or masses.  EXTREMITIES:  Some venous insufficiency and 1+ edema.  No cyanosis or clubbing.  NEUROLOGIC:  Intact  without focal abnormalities detected.  LABORATORY DATA:  EKG showed normal sinus rhythm, left axis deviation, poor R-wave progression anterior compatible with an old anterior infarct, and nonspecific ST-T wave changes.  The chest x-ray revealed cardiomegaly unchanged from old films, changes of COPD, and some thoracic spur formation along with bilateral acromioclavicular spur formation.  CT of the chest showed changes of COPD and a small amount of patchy atelectasis or scar formation in the right upper lobe and left base.  Blood gases:  pH 7.45, pCO2 35.8, pO2 68.7 on room air.  Hemoglobin 14.0, hematocrit 41.3, white count 10,200 with 63% segs.  PT 11.5, INR 0.8, PTT 22 seconds.  Sodium 139, potassium 3.9, chloride 102, CO2 29, BUN 19, creatinine 0.7, blood sugar 235.  Blood sugars were followed serially throughout her hospital course.  Calcium 9.1, total protein 6.2, albumin 3.5, AST 19, ALT 29, alkaline phosphatase 47, total bilirubin 0.7.  TSH 0.44, free T4 1.33, free T3 2.80.  The urinalysis was clear, except for some protein in the urine and sugar.  Bronchial washings showed no growth and were negative for AFB and fungi.  Cytology pending at the time of dictation.  HOSPITAL COURSE:  The patient was admitted with refractory asthmatic bronchitis for further evaluation and treatment.  Pulmonary function studies prior to admission showed horrible function with an FVC of 1.05 L and an FEV1 of 0.49 L for a FEV1/FVC ratio of 46%.  She was admitted and placed on low-flow oxygen, hand-held nebulizer treatment with albuterol, and given chest physiotherapy and flutter valve treatments q.1h.  She was started on Advair, Humibid LA, and IV fluids.  She was also started on empirically on Maxipen. Blood sugars were carefully monitored with her 70/30 doses continued along with Avandia, but her Glucophage was held.  Sliding scale coverage was utilized.  With the chest PT and flutter valve therapy,  she noticed some mild improvement.  After the CT showed some atelectasis, the decision was made to proceed with bronchoscopy for lavage.  This was done on May 03, 2000, with no  endobronchial lesions identified.  Mild to moderate thick secretions noted at the lung bases were easily lavaged clear with saline.  Bronchial washings and cytology are pending.  Post lavage, the patient felt much better and was having an easier time expectorating some thick sputum and had resolution of her dyspnea.  IV Solu-Medrol which had been started on admission was weaned off due to her diabetes and Maxipen was switched to oral Ceftin.  She continued to improve, ambulated on the ward, and was felt to be MHP and ready for discharge on May 05, 2000.  MEDICATIONS AT DISCHARGE:  She was instructed to continue her Humibid two tablets p.o. b.i.d. with plenty of fluids.  She has Ventolin and Flovent that she has been using at home plus the Advair sample that she has here in the hospital, one inhalation twice a day.  These will be adjusted on an outpatient basis.  She has Humulin 70/30 25 units in the morning and 20 units in the evening, Glucophage 5 mg p.o. b.i.d.,  Avandia 4 mg p.o. b.i.d., Altace 10 mg p.o. q.d., atenolol 50 mg p.o. q.d., Lasix 40 mg p.o. q.d., Zocor 40 mg p.o. q.h.s., aspirin one tablet p.o. q.d., Prevacid 30 mg p.o. q.d., alprazolam 0.5 mg p.o. t.i.d., Celebrex 200 mg p.o. q.d. p.r.n., Darvocet one every four hours as needed for pain, Premarin as directed by gynecology, and Beconase as needed for her nose.  CONDITION AT DISCHARGE:  Improved.  FOLLOW-UP:  She will call the office for a follow-up appointment in one to two weeks. DD:  05/05/00 TD:  05/06/00 Job: [id number]

## 2010-07-21 NOTE — Consult Note (Signed)
[place]. [facility]  Patient:    [name], [name]                          MRN: [id number] Proc. Date: 10/09/99 Adm. Date:  [id number] Attending:  Junious Becker CC:         [name], M.D. [facility]   Consultation Report  DATE OF BIRTH:  [birth year]  REQUESTING PHYSICIAN:  [name], M.D.  [name] is a 70 year old white female referred by Dr. Chales Becker for a dental consultation.  The patient was admitted with unstable angina on October 05, 1999.  The patient underwent a cardiac catheterization and is now planned for an angioplasty procedure on October 10, 1999, with Dr. Chales Becker.  Dental consultation was requested for the evaluation of a tooth ache.  PAST MEDICAL HISTORY: 1. History of unstable angina and reason for this admission. 2. Coronary artery disease.    a. Status post acute MI, May 2000.    b. Status post percutaneous intervention with stenting on May 2000 and       December 2000.    c. Anticipate angioplasty procedure on October 10, 1999, with Dr. Chales Becker. 3. COPD/asthmatic bronchitis. 4. Hypertension. 5. Hypercholesterolemia. 6. Diabetes mellitus, type 2, on insulin.  ALLERGIES/ADVERSE DRUG REACTIONS: 1. CODEINE - makes patient "sick on my stomach".  The patient has used    Vicodin and Darvocet-N 100 without problems this admission. 2. BRETHINE - causes "whelps".  MEDICATIONS: (Per MAR)  1. Plavix 75 mg daily.  2. Humulin 70/30 25 units every morning and 15 units every evening.  3. Avandia 4 mg daily.  4. Humibid LA two every 12 hours.  5. Lasix 40 mg daily.  6. Zocor 40 mg daily.  7. Celebrex 200 mg daily.  8. Flovent two puffs four times daily.  9. Ventolin inhaler two puffs four times daily. 10. Altace 5 mg daily. 11. Tenormin 50 mg daily. 12. Enteric-coated aspirin 325 mg daily. 13. Flonase two squirts each nostril every morning. 14. Zyrtec 5 mg daily. 15. Vicodin and Darvocet-N 100 as needed for pain.  SOCIAL HISTORY:  The  patient is widowed with five children.  The patient has a history of smoking one pack per day since the age of 3.  The patient quit in May 2000.  The patient denies the use of alcohol.  FAMILY HISTORY:  Mother is alive with a history of ulcers and arthritis - otherwise healthy.  Father is deceased at age 22 from a myocardial infarction. Brother deceased at age 78 from a myocardial infarction.  FUNCTIONAL ASSESSMENT:  The patient was independent for ADLs prior to this admission.  REVIEW OF SYSTEMS:  (This was reviewed from the chart and health history assessment form and is included in the dental consultation record.)  DENTAL HISTORY:  CHIEF COMPLAINT:  The patient complains of toothache on the lower right.  HISTORY OF PRESENT ILLNESS:  The patient has a history of toothache on lower right tooth (patient points to tooth #27 root segment) since yesterday evening.  Pain is described as "throbbing" with more characteristics of a sharp pain versus a dull pain.  Pain has been intermittent in nature and does not bother her at this time.  The patient has used Tylenol and Darvocet-N 100 with minimal help.  The patient has also used Vicodin with good pain relief, but the relief wears off after four hours or so and then she "needs more  medicine".  The patient denies any problems with Vicodin as previously reported with Codeine.  The patient last saw a private dentist approximately ten years ago to have her dentures remade.  The patient has not had a cleaning for "a long time", and this is probably greater than ten years ago.  The patient denies the presence of a lower partial denture.  The patient indicates that the initial upper complete denture was made approximately 20 years ago.  The patient indicates that the upper complete denture fits "good".  DENTAL EXAM:  GENERAL:  The patient is a well-developed, overweight, white female in no acute distress.  VITAL SIGNS:  Blood pressure  120/70, pulses 64, temperature 98.2, respirations 22.  HEAD/NECK:  There is no palpable lymphadenopathy.  The patient indicates that the area in the submental node is tender to palpation.  The patient denies acute TMJ symptoms at this time.  INTRAORAL:  The patient has normal saliva.  There is no apparent soft tissue pathology, abscess formation or swelling.  PERIODONTAL:  The patient with chronic periodontal disease with plaque and calculus accumulation/accretions, gingival recession, mild to moderate bone loss.  DENTITION:  There are multiple missing teeth, #1 through #16, #17 through #20, and #29 through #31.  There are root segments at #21, #22 and #27 which are present.  DENTAL CARIES:  There are dental caries which are affecting root segments #21, #22 and #27.  ENDODONTIC:  There is a history of acute pulpitis symptoms of tooth #27 root segment.  There is no obvious periapical radiolucencies seen at the apices of tooth #27 as per review of panoramic x-ray.  Tooth #27 root segment is sensitive to palpation on the facial aspect.  CROWN/BRIDGE:  There is no history of crown or bridge restorations.  PROSTHODONTICS:  The patient has a history of an upper complete denture remade approximately ten years ago.  This denture "fits good" by patient report.  The patient denies the presence of a lower partial denture.  OCCLUSION:  The patient has a poor occlusal scheme secondary to multiple missing teeth, multiple root segments, supraeruption and drifting of the unopposed teeth into the edentulous areas and lack of replacement of the lower missing teeth by the dental prosthesis.  RADIOGRAPHIC INTERPRETATION:  (Panoramic x-ray was taken on October 09, 1999). There are multiple missing teeth #1 through #16, #17 through #20, and #29 through #31.  There are root segments at #21, #22 and #27.  There is mild to moderate bone loss.  There is supraeruption and drifting of the  unopposed  teeth into the edentulous areas.  There are dental caries which are affecting the root segments.  There is no apparent periapical radiolucency noted.  ASSESSMENT:  1. History of acute pulpitis symptoms of tooth #27 root segment.  There is no     apparent periapical radiolucency noted.  2. Plaque and calculus accumulations/accretions.  3. Chronic periodontal disease with bone loss.  4. Gingival recession.  5. Multiple missing teeth.  6. Multiple root segments #21, #22 and #27.  7. Supraeruption and drifting of the unopposed teeth into the edentulous     areas.  8. Lack of replacement of the missing lower teeth with a dental prosthesis.  9. Poor occlusal scheme secondary to #5 through #8 as above. 10. History of acceptable upper complete denture by patient report. 11. Left submental tenderness to palpation. 12. Anticipated angioplasty on October 10, 1999, with Dr. Chales Becker. 13. Risk for bleeding secondary to Plavix and aspirin  therapy.  PLAN/RECOMMENDATIONS: 1. I discussed the risks, benefits and complications of various treatment    options with the patient in relationship to her medical and dental    condition, anticipated angioplasty procedure, and risks for bleeding    complications.  I discussed total and subtotal extractions with    alveoplasty, periodontal therapy, root canal therapy, dental restorations,    crown and bridge therapy, implant therapy and fabrication of the definitive    dental prosthesis after adequate healing.  The patient is to think about    her options at this time and discuss with Dr. Chales Becker.  The patient is able    to handle tooth pain at this time to allow the angioplasty procedure for    tomorrow.  The patient is interested in treatment this admission    if possible. 2. Will discuss findings with Dr. Chales Becker and discuss the ability/stability of    the patient to proceed with dental extractions during this admission with    alveoplasty as well as  potential complications associated with bleeding. 3. Will initiate chlorhexidine rinse 0.12% rinse with 15 mL twice daily    after breakfast and at bedtime.  The patient is to spit out the excess and    not swallow. DD:  10/10/99 TD:  10/10/99 Job: [id number]

## 2010-07-21 NOTE — Discharge Summary (Signed)
[place]. [facility]  Patient:    [name], [name]                          MRN: [id number] Adm. Date:  [id number] Disc. Date: 10/12/99 Attending:  Junious Silk Dictator:   Lavella Hammock, P.A.- C. CC:         Lonzo Cloud. Kriste Basque, M.D. [facility]  Cindra Eves, D.D.S..   Discharge Summary  DATE OF BIRTH:  October 07, 1940  PROCEDURE: 1. Cardiac catheterization. 2. Coronary arteriogram. 3. Left ventriculogram. 4. Percutaneous transluminal coronary angioplasty and brachytherapy    to one vessel.  HISTORY OF PRESENT ILLNESS:  Ms. Deringer is a 70 year old female with a history of coronary artery disease, who was seen in the office on October 05, 1999, for progressive chest discomfort.  Because of her history of coronary artery disease and because of the similarity of her symptoms to her previous symptoms prior to intervention, she was admitted to rule out a myocardial infarction and for further evaluation.  HOSPITAL COURSE: Her enzymes were negative for a myocardial infarction, and she had a cardiac catheterization on October 06, 1999.  Her ejection fraction was 64% with anterolateral hypokinesis.  The LAD had a 20%-30% proximal stenosis followed by a 40% segmental irregularity and an 80% focal stenosis overlying the first diagonal.  This is just proximal to the previous stent site which had 30%-40% in-stent narrowing.  There were luminal irregularities in the distal vessel.  The circumflex had a first marginal branch with a 30% narrowing and a sub-branch which had about 90% narrowing.  The AV circumflex was dominant and without critical disease.  There was approximately 70% narrowing in the proximal RCA which was a nondominant vessel.  Plavix was added to her medication regimen in anticipation of percutaneous intervention. She was scheduled for this.  She had significant pain from a toothache and a dental consultation was called.  It was felt by Dr. Cindra Eves, D.D.S that extraction was indicated.  He extracted root segments of teeth #2, #22, and #27 without difficulty on October 11, 1999.  She tolerated the procedure well and had no significant difficulty post-procedure.  She had percutaneous transluminal coronary angioplasty with brachial therapy to her LAD on October 10, 1999.  It reduced the stenosis from 70% to 0% with TIMI-3 flow.  The brachytherapy was successful and she was placed on Integrilin overnight.  She tolerated this procedure well and had no further episodes of chest pain.  Her mouth pain improved as well after the dental extractions.  DISPOSITION:  By October 12, 1999, she was considered stable for discharge with an increase in Altace to better control her blood pressure.  LABORATORY DATA:  Hemoglobin 12.6, hematocrit 35.3, wbcs 8.7, platelets 288. Sodium 137, potassium 4.2, chloride 100, CO2 of 31, BUN 13, creatinine 0.7, glucose 174.  Serial CPK, MB, and troponin I negative for a myocardial infarction.  The total cholesterol 201, triglycerides 709, HDL 31, LDL not calculated.  TSH 1.545.  Chest x-ray:  PA and lateral views with comparison films of February 2001, revealed the heart size to be enlarged with chronic changes again noted in the left cardiophrenic angle with no active findings.  CONDITION ON DISCHARGE:  Improved.  CONSULTATION:  Dr. Cindra Eves, D.D.S., dentistry.  DISCHARGE DIAGNOSES: 1. Coronary artery disease, status post percutaneous transluminal coronary    angioplasty and brachytherapy of the mid-left anterior descending coronary  artery this admission. 2. Status post percutaneous transluminal coronary angioplasty and stent to    the left anterior descending coronary artery in May 2000, with a repeat    percutaneous transluminal coronary angioplasty to this lesion in December    2000. 3. Hypertension. 4. Hypertriglyceridemia. 5. Insulin-dependent diabetes mellitus. 6. Status post partial  hysterectomy and cholecystectomy. 7. History of chronic obstructive pulmonary disease. 8. History of asthmatic bronchitis. 9. Preserved left ventricular function.  DISCHARGE INSTRUCTIONS: 1. Her activity level is to include no driving for two days, and she is to    gradually increase activity. 2. She is to stick to a low-fat, diabetic diet. 3. She is to call the office for bleeding, swelling, or drainage from the    catheterization site. 4. She is to follow up with Dr. Veneda Melter on November 14, 1999, at    9:45 a.m. 5. She is to follow up with Dr. Lonzo Cloud. Kriste Basque as needed. 6. She is to follow up with Dr. Kristin Bruins on October 18, 1999, at 11 a.m.  DISCHARGE MEDICATIONS:  1. Altace 10 mg q.d.  2. Beconase nasal spray q.d.  3. Zyrtec 5 mg q.d.  4. Plavix 75 mg q.d.  5. Insulin 70/30, 25 units q.a.m., 15 units q.p.m.  6. Avandia 4 mg q.d.  7. Humibid LA 600 mg two tablets b.i.d.  8. Lasix 40 mg q.d.  9. Zocor 40 mg q.d. 10. Celebrex 200 mg q.d. 11. Flovent two puffs q.i.d. 12. Albuterol two puffs q.i.d. 13. Atenolol 50 mg q.d. 14. Aspirin 325 mg q.d. 15. Glucophage 500 mg, restart on October 14, 1999. 16. Darvocet p.r.n. 17. Nitroglycerin p.r.n. DD:  10/12/99 TD:  10/12/99 Job: [id number]

## 2010-07-21 NOTE — Cardiovascular Report (Signed)
[name]. [facility]  Patient:    [name], [name]                          MRN: [id number] Proc. Date: 10/10/99 Adm. Date:  [id number] Attending:  Junious Silk CC:         Lonzo Cloud. Kriste Basque, M.D. [facility]   Cardiac Catheterization  PROCEDURES PERFORMED:  Percutaneous transluminal coronary angioplasty of the mid left anterior descending with brachytherapy administration.  DIAGNOSES:  Coronary artery disease with in-stent restenosis.  HISTORY:  [name] is a 70 year old white female with known history of coronary artery disease who suffered an anterior wall myocardial infarction, Jul 14, 1998, treated with angioplasty.  At that time, she had placement of 3.0 x 18.0-mm AVE stent in the mid LAD.  The patient had done well in the interim; however, she has recently developed recurrence of angina prompting readmission to the hospital and catheterization showing restenosis of the proximal segment of the stented area with slight extension of the native vessel at the takeoff of the diagonal branch.  She presents now for percutaneous intervention and possible brachytherapy.  TECHNIQUE:  Informed consent had been obtained from the patient.  She was brought to the catheterization lab where both groins were sterilely prepped and draped.  Lidocaine 1% was used to infiltrate the right groin.  An 8-French sheath was placed in the right femoral artery using the modified Seldinger technique.  An 8-French JL4 guide catheter was then used to engage the left coronary artery and selective guide shots obtained using manual injections of contrast; this confirmed the presence of a high-grade narrowing of at least 70% in the mid LAD in the area of previous stenting just distal to the first diagonal branch.  We then proceeded with percutaneous intervention.  The patient was administered heparin and Integrilin on a weight-adjusted basis to maintain ACT at approximately 250 seconds.   A 0.014-inch Hi-Torque floppy wire was advanced into the distal LAD and a 3.0 x 15.0-mm cutting balloon introduced.  A total of 5 dilatations were performed in the mid LAD at up to 10 atmospheres for 30 seconds.  Repeat angiography showed an excellent result with approximately 10% residual narrowing and no evidence of vessel damage. At this point, we proceeded with brachytherapy.  A 30-mm ______ was tested outside the body and then introduced and positioned under fluoroscopy across the stented segment in the mid LAD and encompassing the first diagonal branch. Using a beta source, a ______ time of three minutes and three seconds was administered.  Repeat angiography showed no vessel damage and persistence of benefit.  There was mild vasospasm responsive to nitroglycerin.  Final angiography was then performed in various projections showing no vessel damage.  The patient had tolerated the procedure well.  At this point, we withdrew the catheter, the sheath was secured in position and the patient transferred to the holding area in stable condition.  The sheath will be removed when the ACT returns to normal.  FINAL RESULT:  Successful percutaneous transluminal coronary angioplasty of the mid LAD with reduction of 70% narrowing to less than 10% with a 3.0-mm cutting balloon and adjuvant administration of brachy beta radiation for in-stent restenosis. DD:  10/10/99 TD:  10/11/99 Job: [id number]

## 2010-07-21 NOTE — Assessment & Plan Note (Signed)
[facility]                           GASTROENTEROLOGY OFFICE NOTE   MAKENZIE, WEISNER                            MRN:          [id number]  DATE:12/12/2005                            DOB:          Mar 03, 1941    REFERRING PHYSICIAN:  Lonzo Cloud. Kriste Basque, MD   REASON FOR REFERRAL:  Dr. Kriste Basque asked me to evaluate Ms. Dedman regarding  iron-deficiency anemia.   HISTORY OF PRESENT ILLNESS:  Ms. Holton is a very pleasant 70 year old woman  who was recently found to be anemic on routine labs in August of 2007.  Her  hemoglobin at that time was 9.0.  Her MCV was 79.8 which is on the low side  of normal.  Followup iron studies showed an iron level of 31 which is low  but a transferrin of 347 which is normal.  She has had no rectal bleeding,  no melena and no hematemesis.  Her weight has been stable.  She has had no  constipation or diarrhea.   REVIEW OF SYSTEMS:  Notable for stable weight and is otherwise essentially  normal and is available on our nurse intake sheet.   PAST MEDICAL HISTORY:  1. Obesity.  2. Hypertension.  3. Acute MI in 2000.  4. Status post angioplasty in 2000, 2001, and 2002.  5. Emphysema.  6. Diabetes.  7. Elevated cholesterol.  8. Arthritis.  9. Status post cholecystectomy.  10.Status post hysterectomy.   CURRENT MEDICINES:  Pulmicort, DuoNeb, Calcium, tramadol, insulin,  Glucophage, Lasix, aspirin, Zocor, atenolol, Avandia, alprazolam, Protonix,  Prinivil, Flonase, etodolac, Boniva, Mucinex, and Pulmicort.   SOCIAL HISTORY:  Widowed with 5 children.  Nonsmoker, nondrinker.   FAMILY HISTORY:  Several family members with diabetes, no colon cancer, or  colon polyps in family.   PHYSICAL EXAMINATION:  Height 5 feet 4-1/2 inches, 222 pounds.  Blood  pressure is 120/70, pulse 68.   CONSTITUTIONAL:  Obese, otherwise well-appearing.  NEUROLOGIC:  Alert and oriented x3.  EYES:  Extraocular muscles are intact.  MOUTH:  Oropharynx  moist, no lesions.  NECK:  Supple.  No lymphadenopathy.  CARDIOVASCULAR:  Heart regular rate and rhythm.  LUNGS:  Clear to auscultation bilaterally.  ABDOMEN:  Soft.  Nontender, nondistended.  Normal bowel sounds.  EXTREMITIES:  No lower extremity edema.  SKIN:  No rashes or lesions on visible extremities.   ASSESSMENT AND PLAN:  A 70 year old woman with iron deficiency anemia  slightly low normal MCV level.   She has no signs of over GI bleeding with her somewhat microcytic anemia and  low iron levels.  We should proceed with full screening colonoscopy at her  soonest convenience. I see no reason for any further blood test or  __________ studies prior to then.            ______________________________  Rachael Fee, MD      DPJ/MedQ  DD:  12/12/2005  DT:  12/14/2005  Job #:  [id number]   cc:   Lonzo Cloud. Kriste Basque, MD

## 2010-07-21 NOTE — Assessment & Plan Note (Signed)
[facility]                              CARDIOLOGY OFFICE NOTE   [name], [name]                          MRN:          [id number]  DATE:10/30/2005                            DOB:          1940/10/20    REASON FOR VISIT:  Routine cardiac follow up.   HISTORY OF PRESENT ILLNESS:  I saw [name] back in February.  Her history  is outlined in my previous note.  She reports that she has done fairly well,  actually now enrolled in pulmonary rehabilitation and enjoying this. She  states that she wheezes occasionally but has had no major limiting shortness  of breath beyond NYHA class II.  She reports infrequent brief chest pain  that does not require sublingual nitroglycerin. She has had no prolonged  episodes of angina. Her electrocardiogram today shows sinus rhythm at 63  beats per minute with a leftward axis and evidence of prior anteroseptal  infarct. There are no marked new abnormalities. She mentions to me that she  had a chest x-ray obtained recently, this past Friday, and that [name]  mentioned to her that her heart was enlarged.  I am not certain as yet  whether this was felt to be an interval increase as her prior studies have  also indicated cardiomegaly. She did have an echocardiogram approximately 6  months ago that demonstrated an ejection fraction of 55-60% with left atrial  enlargement, wall motion abnormalities consistent with infarct and no major  valvular abnormalities. She is not reporting any orthopnea, PND or lower  extremity edema.   ALLERGIES:  CODEINE.   PRESENT MEDICATIONS:  HHM triple combo x4 daily, Humulin 70/30 20 units SQ  q.a.m., 15 units SQ q.p.m., Glucophage 500 mg p.o. b.i.d., Lasix 40 mg p.o.  daily, aspirin 81 mg p.o. daily, Zocor 40 mg p.o. daily, Atenolol 50 mg p.o.  daily, Avandia 4 mg p.o. b.i.d., alprazolam 0.5 mg 1/2 to 1 tablet p.o.  t.i.d., Protonix 40 mg daily, Prinivil 10 mg p.o. daily, Flonase as  directed, etodolac 400 mg p.o. q.12 hours, Boniva as directed, Mucinex as  directed, nitroglycerin 0.4 mg sublingual p.r.n.   REVIEW OF SYSTEMS:  As in history of present illness, otherwise negative.   PHYSICAL EXAMINATION:  VITAL SIGNS:  Blood pressure 132/74, heart rate 63,  weight 225 pounds.  GENERAL:  The patient is comfortable and in no acute distress.  EXAMINATION OF NECK:  Reveals no elevated jugular venous pressure, without  bruits, no thyromegaly is noted.  LUNGS:  Clear with diminished breath sounds but no labored breathing or  wheezing noted.  CARDIAC EXAM:  Reveals a regular rate and rhythm, there is no loud murmur or  S3 gallop, PMI is indistinct.  ABDOMEN:  Soft with no bruit.  EXTREMITIES:  Exhibit no significant pitting edema.   IMPRESSION AND RECOMMENDATION:  1. Coronary artery disease status post remote anterior wall myocardial      infarction treated with stent placement to left anterior descending and      subsequently re-intervention with cutting balloon angioplasty and  brachy therapy due to in-stent restenosis. She has had follow up      ischemic testing over the last 6 months demonstrating no active      ischemia and is stable symptomatically with recently documented normal      left ventricular ejection fraction. I will plan to review the final      chest x-ray report from this past Friday when available.  If there has      been a major interval change in cardiac size we will proceed with a      repeat echocardiogram, otherwise would continue medical therapy. Follow      up will be planned over the next 6 months.  2. Hypertension, reasonable controlled today.  3. Chronic obstructive pulmonary disease, follow up with [name].  4. Hyperlipidemia, followed by [name]. Would suggest a goal LDL      cholesterol in the 70-80 range.                                Jonelle Sidle, MD    SGM/MedQ  DD:  10/30/2005  DT:  10/30/2005  Job #:   [id number]   cc:   Margaret Cloud. Kriste Basque, MD

## 2010-07-21 NOTE — Cardiovascular Report (Signed)
[place]. [facility]  Patient:    [name], [name]                            MRN: [id number] Proc. Date: 10/06/99 Attending:  Arturo Morton. Riley Kill, M.D. [facility] CC:         Lonzo Cloud. Kriste Basque, M.D. [facility]  The CV Laboratory   Cardiac Catheterization  INDICATIONS:  [name] is a diabetic who has had previous percutaneous intervention.  She has developed recurrent angina and was brought back to the catheterization lab for further evaluation.  The risks and options were discussed with the patient prior to the procedure.  PROCEDURES: 1. Left heart catheterization. 2. Selective coronary arteriography. 3. Selective left ventriculography.  DESCRIPTION OF PROCEDURE:  The procedure was performed from the right femoral artery using #6 French catheters.  She tolerated the procedure well without complications.  HEMODYNAMIC DATA:  The central aortic pressure was 151/84, LV pressure was 143/12, and no gradient on pullback across the aortic valve.  ANGIOGRAPHIC DATA:  Ventriculography in the RAO projection revealed well-preserved systolic function.  Ejection fraction was 64%.  There was anterolateral hypokinesis.  The left main coronary artery was free of critical disease.  The LAD demonstrated about a 20-30% area of segmental plaquing in the proximal vessel prior to the septal.  Just beyond the septal is a long area of segmental irregularity of about 40% followed by an 80% focal stenosis overlying the origin of the first diagonal.  This involves the small, first diagonal.  Beyond this point is the stent site, which has about 30-40% end stent renarrowing.  The distal vessel wraps the apex and has mild luminal irregularity.  The circumflex provides a large, first marginal branch with a large superior branch that has 30% narrowing and no evidence of high grade restenosis.  There was a sub-branch which was small in caliber and had about 90% narrowing.  The A-V circumflex was  dominant and without critical disease.  The proximal right is a non-dominant vessel with about 70% narrowing in the proximal vessel.  CONCLUSIONS: 1. Restenosis just proximal to the previous stent site extending into the    proximal aspect of the stent. 2. Preserved overall LV function with mild anterolateral hypokinesis following    prior myocardial infarction. 3. A 90% stenosis of a sub-branch of the obtuse marginal branch.  DISPOSITION:  I have reviewed the films with Dr. Chales Abrahams in the catheterization laboratory.  At the present time, we would favor an attempt at possible radiation therapy.  Given the location of the lesion, it would favor cutting balloon in the proximal portion of the stent and the area just above the stent with overlying radiation to prevent recurrent end stent restenosis.  If, by intravascular ultrasound, there is more extensive disease proximally, then there may need to be a stent placed.  If that is the case, then we would not recommend radiation.  I have discussed these possibilities with Dr. Chales Abrahams.  We plan to proceed on Tuesday. DD:  10/06/99 TD:  10/06/99 Job: [id number]

## 2010-07-21 NOTE — Procedures (Signed)
[place]. [facility]  Patient:    [name], [name]                          MRN: [id number] Proc. Date: 05/03/00 Adm. Date:  [id number] Attending:  Verdene Lennert                           Procedure Report  DATE OF BIRTH:  1941/01/08.  PROCEDURE:  Bronchoscopy.  INDICATIONS:  The patient is a 70 year old white female who was admitted with refractory asthmatic bronchitis and respiratory insufficiency.  She has had several weeks of cough, congestion, increasing shortness of breath, and difficulty mobilizing secretions.  Exam has revealed coarse rhonchi bilaterally, which have been difficult for her to mobilize.  Chest x-ray showed COPD with some basilar atelectasis, and CT scan failed to reveal any endobronchial lesions.  Bronchoscopy is undertaken at the present time to evaluate the airways and for bronchial lavage in the hopes of removing significant mucus plugs to aid in resolution of this episode.  PREMEDICATION, ANESTHESIA, AND COUNSELING:  Bronchoscopy performed in the pulmonary lab endoscopy suite without fluoroscopy.  An IV was started with D5W KVO.  Cardiac monitoring was done, revealing normal sinus rhythm and sinus tachycardia without ectopics.  Oxygen was administered at 4 L/min. per nasal cannula.  Continuous finger pulse oximetry is performed throughout the test.  Topical anesthesia was obtained with 300 mg of lidocaine given to the airway during the test.  Cough anesthesia was obtained with 100 mg of Demerol given IV slowly during the procedure.  General anesthesia was obtained with 7.5 mg of Versed given IV slowly during the test.  The patient tolerated the procedure well.  FINDINGS: 1. Upper airway and cords:  The upper airway, including the nasopharynx and    posterior pharynx, were normal as far as could be seen.  The larynx,    including the epiglottis, false cords, and true cords, appeared normal, and    the vocal cords moved  normally with phonation. 2. Trachea and carina:  The trachea was normal, and the carina was sharp. 3. Bronchi:  There was essentially normal endobronchial anatomy throughout.    There were no endobronchial lesions seen anywhere in the tracheobronchial    tree.  There were moderately thick bronchial secretions with numerous    distal mucus plugs, but not as bad as originally suspected.  These plugs    were easily lavaged clear with saline.  The underlying mucosa was found to    be mildly inflamed and somewhat friable.  They were lavaged with saline and    sent or analysis.  IMPRESSION:  Negative bronchoscopy with no endobronchial lesions seen.  She had some mildly thick secretions and mucus plugs distally, which were easily lavaged clear.  No underlying lesions identified.  SPECIMENS:  Bronchial washings for cytology and cultures, including routine AFB and fungi. DD:  05/03/00 TD:  05/03/00 Job: [id number]

## 2010-07-21 NOTE — Cardiovascular Report (Signed)
[place]. [facility]  Patient:    GRACI, HULCE                          MRN: [id number] Proc. Date: 10/10/99 Adm. Date:  [id number] Attending:  Junious Silk CC:         Lonzo Cloud. Kriste Basque, M.D. [facility]   Cardiac Catheterization  PROCEDURES PERFORMED:  Percutaneous transluminal coronary angioplasty of the mid left anterior descending with administration of brachytherapy.  DIAGNOSIS:  Coronary artery disease with in-stent restenosis.  HISTORY:  Ms. Thorp is a 70 year old white female with a history of coronary artery disease who has undergone percutaneous intervention in the past. DD:  10/10/99 TD:  10/11/99 Job: [id number]

## 2010-07-21 NOTE — Assessment & Plan Note (Signed)
[facility]                            CARDIOLOGY OFFICE NOTE   [name], [name]                          MRN:          [id number]  DATE:04/12/2006                            DOB:          1940/03/14    PRIMARY CARE PHYSICIAN:  Lonzo Cloud. Kriste Basque, M.D.   REASON FOR VISIT:  Routine cardiac followup.   HISTORY OF PRESENT ILLNESS:  [name] returns with her daughter today  to clinic.  She tells me that, frankly, she has been doing quite well.  She still has occasional wheezing, but overall states that her  respiratory status has been stable.  She tells me that she uses oxygen  and will go up to [employer], putting her oxygen tank in the cart and  walking around continuously for 30 minutes for exercise.  She is not  reporting any angina.  Her electrocardiogram today is relatively stable,  showing sinus bradycardia with premature atrial complexes, leftward axis  and decreased R-wave progression anteriorly, which have been noted  previously.   Her medications are outlined below.   ALLERGIES:  CODEINE.   PRESENT MEDICATIONS:  1. Ferrous sulfate 325 mg p.o. daily.  2. Triple combo hand-held nebulizer.  3. Humulin 70/30 20 units subcu q.a.m., 15 units subcu q.p.m.  4. Glucophage 500 mg p.o. b.i.d.  5. Furosemide 40 mg p.o. daily.  6. Aspirin 81 mg p.o. daily.  7. Zocor 40 mg p.o. daily.  8. Atenolol 50 mg p.o. daily.  9. Avandia 4 mg p.o. b.i.d.  10.Protonix 40 mg p.o. daily.  11.Prinivil 10 mg p.o. daily.  12.Nasonex 2 sprays daily.  13.Etodolac 400 mg p.o. q. 12 hours.  14.Boniva monthly.  15.Mucinex b.i.d.  16.Sublingual nitroglycerin 0.4 mg p.r.n.   REVIEW OF SYSTEMS:  As described in History of Present Illness.   EXAMINATION:  Blood pressure is 140/80, heart rate is 57, weight is 224  pounds.  The patient is comfortable today and in no acute distress.  Examination of the neck reveals no elevated jugular venous pressure or  loud bruits.  No  thyromegaly is noted.  Lungs exhibit significantly  diminished breath sounds throughout.  No wheezing at rest.  Cardiac exam  reveals a regular rate and rhythm with no loud systolic murmur or S3  gallop.  There is no pericardial rub.  Abdomen is soft, nontender.  Extremities exhibit no significant pitting edema.   IMPRESSION AND RECOMMENDATIONS:  1. Coronary artery disease, status post remote anterior wall      myocardial infarction.  The patient is status post intervention to      the left anterior descending, including original stent placement      followed by cutting balloon angioplasty and brachytherapy due to in-      stent restenosis.  She is stable, symptomatically, and we will plan      to continue medical therapy with a symptom followup in six months.      I note that the chest x-ray obtained around the time of our last      visit showed stable moderate cardiomegaly.  She is not manifesting      any active congestive heart failure symptoms.  Her ejection      fraction is within normal range.  2. Hypertension and hyperlipidemia.  Followed by Dr. Kriste Basque.     Jonelle Sidle, MD  Electronically Signed    SGM/MedQ  DD: 04/12/2006  DT: 04/12/2006  Job #: [id number]   cc:   Lonzo Cloud. Kriste Basque, MD

## 2010-07-21 NOTE — Op Note (Signed)
[place]. [facility]  Patient:    [name], [name]                          MRN: [id number] Proc. Date: 10/11/99 Adm. Date:  [id number] Attending:  Junious Silk CC:         Veneda Melter, M.D. [place]   Operative Report  PREOPERATIVE DIAGNOSES: 1. History of acute pulpitis tooth #27. 2. Multiple root segments #21, 22, and 27. 3. History of unstable angina.  POSTOPERATIVE DIAGNOSES: 1. History of acute pulpitis tooth #27. 2. Multiple root segments #21, 22, and 27. 3. History of unstable angina.  PROCEDURE: 1. Dental examination. 2. Surgical extraction of root segments #21, 22 and 27 with alveoloplasty.  SURGEON:  Charlynne Pander, D.D.S.  ASSISTANT:  Andi Hence (dental student).  ANESTHESIA:  Monitored anesthesia care per the anesthesia team.  LOCAL ANESTHESIA: 1. Two carpules each containing 36 mg of Xylocaine with 0.018 mg of    epinephrine. 2. Two carpules each containing 9 mg of Marcaine with 0.009 mg of epinephrine.  SPECIMENS:  There were three root segments, which were discarded.  DRAINS AND CULTURES:  None.  ESTIMATED BLOOD LOSS:  Less than 50 ml.  FLUIDS:  100 ml of normal saline solution.  COMPLICATIONS:  None.  INDICATIONS:  The patient had a history of acute pulpitis of root segment #27. The patient also had a significant medical history for coronary artery disease and was status post angioplasty on October 10, 1999 with Dr. Chales Abrahams.  A dental consultation was requested and dental disease was noted.  The patient was evaluated and treatment plan for extraction of root segments #21, 22 and 27 with alveoloplasty was indicated with placement of Surgicel in the extraction sites to assist in the coagulation process.  The treatment plan was formulated to decrease the risks and complications associated with dental infection affecting the patients systemic health.  OPERATIVE FINDINGS:  The patient was examined in operating room #9.   The teeth were identified for extraction.  Multiple root segments, #21, 22 and 27 were noted and the risk for complications associated with the dental disease affecting the patients systemic health were identified as the reasons for having these extractions done at this time.  DESCRIPTION OF PROCEDURE:  The patient was brought to the main operating room #9.  The patient was placed in the supine position on the operating room table.  Monitored anesthesia care was induced per the anesthesia team.  The patient was then prepped and draped in the usual sterile manner for an oral surgical procedure.  The patient was then ready for the oral surgical procedure as follows:  Local anesthesia was administered utilizing a total of two carpules, each containing 36 mg of Xylocaine with 0.018 mg of epinephrine, as well as, two carpules, each containing 9 mg of Marcaine and 0.009 mg of epinephrine.  The mandibular left quadrant was first approached.  Anesthesia was delivered as previously described.  A 15 blade incision was made from the mesial of #22 through the distal of #19.  A surgical flap was then reflected.  The root segments, #21 and 22 were then elevated with a series of straight elevators. At that time, a rotary instrument with surgical bur was utilized to remove interseptal bone to allow more movement of the root segments.  The area was irrigated with copious amounts of sterile saline during this procedure.  The root segments were then further  subluxated with a series of straight elevators.  Root segment #22 was first removed with a 151 forceps without complications.  Root segment #21 was then further subluxated and removed with a 151 forceps without complications.  Alveoloplasty was performed in the surgical site.  The surgical site was then irrigated with copious amounts of sterile saline.  Surgicel was placed in the extraction  site.  The surgical site was then closed utilizing 3-0  chromic gut suture with four separate interrupted sutures.  The surgeons attention was then drawn to the mandibular right quadrant.  A 15 blade incision was made from the mesial of tooth #27 through the distal of tooth #27.  A surgical flap was then reflected.  Tooth #27 was then subluxated with a series of straight elevators.  The root segment #27 was then removed with a 151 forceps without complications. The surgical site was then curetted and compressed appropriately.  The surgical site then had alveoloplasty performed utilizing a rongeur and bone file.  The surgical site was then irrigated with copious amounts of sterile saline.  The surgical site then had a piece of Surgicel placed in the extraction site.  The surgical site was then closed utilizing 3-0 chromic gut suture in a continuous interrupted suture technique x 1.  Gauze was placed in the extraction sites at this time.  The patient was examined for complications, seeing none, the patient was deemed ready to be handed over to the anesthesia team.  The patient was then indeed handed over to the anesthesia team in stable condition.  Patient was then taken to the post anesthesia care unit in stable condition. DD:  10/11/99 TD:  10/11/99 Job: [id number]

## 2010-07-24 ENCOUNTER — Other Ambulatory Visit: Payer: Self-pay | Admitting: Pulmonary Disease

## 2010-08-03 ENCOUNTER — Other Ambulatory Visit: Payer: Self-pay | Admitting: Gastroenterology

## 2010-08-03 ENCOUNTER — Encounter: Payer: Medicare Other | Admitting: Gastroenterology

## 2010-08-03 ENCOUNTER — Ambulatory Visit (HOSPITAL_COMMUNITY)
Admission: RE | Admit: 2010-08-03 | Discharge: 2010-08-03 | Disposition: A | Discharge status: Home / Self Care | Payer: Medicare Other | Source: Ambulatory Visit | Attending: Gastroenterology | Admitting: Gastroenterology

## 2010-08-03 DIAGNOSIS — K2990 Gastroduodenitis, unspecified, without bleeding: Secondary | ICD-10-CM

## 2010-08-03 DIAGNOSIS — K259 Gastric ulcer, unspecified as acute or chronic, without hemorrhage or perforation: Secondary | ICD-10-CM

## 2010-08-03 DIAGNOSIS — E119 Type 2 diabetes mellitus without complications: Secondary | ICD-10-CM | POA: Insufficient documentation

## 2010-08-03 DIAGNOSIS — Z79899 Other long term (current) drug therapy: Secondary | ICD-10-CM | POA: Insufficient documentation

## 2010-08-03 DIAGNOSIS — E785 Hyperlipidemia, unspecified: Secondary | ICD-10-CM | POA: Insufficient documentation

## 2010-08-03 DIAGNOSIS — K219 Gastro-esophageal reflux disease without esophagitis: Secondary | ICD-10-CM | POA: Insufficient documentation

## 2010-08-03 DIAGNOSIS — K2970 Gastritis, unspecified, without bleeding: Secondary | ICD-10-CM

## 2010-08-03 DIAGNOSIS — I2510 Atherosclerotic heart disease of native coronary artery without angina pectoris: Secondary | ICD-10-CM | POA: Insufficient documentation

## 2010-08-03 DIAGNOSIS — A048 Other specified bacterial intestinal infections: Secondary | ICD-10-CM | POA: Insufficient documentation

## 2010-08-03 DIAGNOSIS — I10 Essential (primary) hypertension: Secondary | ICD-10-CM | POA: Insufficient documentation

## 2010-08-15 ENCOUNTER — Ambulatory Visit: Payer: Medicare Other | Admitting: Pulmonary Disease

## 2010-08-23 ENCOUNTER — Other Ambulatory Visit: Payer: Self-pay | Admitting: Pulmonary Disease

## 2010-08-31 ENCOUNTER — Other Ambulatory Visit: Payer: Self-pay | Admitting: Pulmonary Disease

## 2010-09-07 ENCOUNTER — Telehealth: Payer: Self-pay | Admitting: Pulmonary Disease

## 2010-09-07 MED ORDER — ALPRAZOLAM 0.5 MG PO TABS: ORAL_TABLET | ORAL | Status: DC

## 2010-09-07 NOTE — Telephone Encounter (Signed)
Refill sent. Pt aware.Deionte Spivack, CMA  

## 2010-10-02 ENCOUNTER — Other Ambulatory Visit: Payer: Self-pay | Admitting: Pulmonary Disease

## 2010-10-04 ENCOUNTER — Ambulatory Visit (INDEPENDENT_AMBULATORY_CARE_PROVIDER_SITE_OTHER): Payer: Medicare Other | Admitting: Pulmonary Disease

## 2010-10-04 ENCOUNTER — Ambulatory Visit (INDEPENDENT_AMBULATORY_CARE_PROVIDER_SITE_OTHER)
Admission: RE | Admit: 2010-10-04 | Discharge: 2010-10-04 | Disposition: A | Discharge status: Home / Self Care | Payer: Medicare Other | Source: Ambulatory Visit | Attending: Pulmonary Disease | Admitting: Pulmonary Disease

## 2010-10-04 ENCOUNTER — Encounter: Payer: Self-pay | Admitting: Pulmonary Disease

## 2010-10-04 ENCOUNTER — Other Ambulatory Visit (INDEPENDENT_AMBULATORY_CARE_PROVIDER_SITE_OTHER): Payer: Medicare Other

## 2010-10-04 DIAGNOSIS — K922 Gastrointestinal hemorrhage, unspecified: Secondary | ICD-10-CM

## 2010-10-04 DIAGNOSIS — E785 Hyperlipidemia, unspecified: Secondary | ICD-10-CM

## 2010-10-04 DIAGNOSIS — M810 Age-related osteoporosis without current pathological fracture: Secondary | ICD-10-CM

## 2010-10-04 DIAGNOSIS — M545 Low back pain, unspecified: Secondary | ICD-10-CM | POA: Insufficient documentation

## 2010-10-04 DIAGNOSIS — F411 Generalized anxiety disorder: Secondary | ICD-10-CM

## 2010-10-04 DIAGNOSIS — E669 Obesity, unspecified: Secondary | ICD-10-CM

## 2010-10-04 DIAGNOSIS — E119 Type 2 diabetes mellitus without complications: Secondary | ICD-10-CM

## 2010-10-04 DIAGNOSIS — I2510 Atherosclerotic heart disease of native coronary artery without angina pectoris: Secondary | ICD-10-CM

## 2010-10-04 DIAGNOSIS — M1990 Unspecified osteoarthritis, unspecified site: Secondary | ICD-10-CM

## 2010-10-04 DIAGNOSIS — D649 Anemia, unspecified: Secondary | ICD-10-CM

## 2010-10-04 DIAGNOSIS — I10 Essential (primary) hypertension: Secondary | ICD-10-CM

## 2010-10-04 DIAGNOSIS — K219 Gastro-esophageal reflux disease without esophagitis: Secondary | ICD-10-CM

## 2010-10-04 DIAGNOSIS — I872 Venous insufficiency (chronic) (peripheral): Secondary | ICD-10-CM

## 2010-10-04 DIAGNOSIS — J449 Chronic obstructive pulmonary disease, unspecified: Secondary | ICD-10-CM

## 2010-10-04 LAB — BASIC METABOLIC PANEL
BUN: 18 mg/dL (ref 6–23)
Chloride: 101 mEq/L (ref 96–112)
Creatinine, Ser: 1.2 mg/dL (ref 0.4–1.2)
Glucose, Bld: 199 mg/dL — ABNORMAL HIGH (ref 70–99)
Potassium: 4.5 mEq/L (ref 3.5–5.1)

## 2010-10-04 LAB — CBC WITH DIFFERENTIAL/PLATELET
Basophils Relative: 0.5 % (ref 0.0–3.0)
Eosinophils Relative: 2.8 % (ref 0.0–5.0)
Lymphocytes Relative: 25.7 % (ref 12.0–46.0)
MCV: 88 fl (ref 78.0–100.0)
Monocytes Absolute: 0.6 10*3/uL (ref 0.1–1.0)
Monocytes Relative: 8.3 % (ref 3.0–12.0)
Neutrophils Relative %: 62.7 % (ref 43.0–77.0)
Platelets: 278 10*3/uL (ref 150.0–400.0)
RBC: 4.17 Mil/uL (ref 3.87–5.11)
WBC: 6.9 10*3/uL (ref 4.5–10.5)

## 2010-10-04 LAB — TSH: TSH: 0.58 u[IU]/mL (ref 0.35–5.50)

## 2010-10-04 LAB — IBC PANEL
Iron: 61 ug/dL (ref 42–145)
Saturation Ratios: 14.6 % — ABNORMAL LOW (ref 20.0–50.0)

## 2010-10-04 LAB — HEMOGLOBIN A1C: Hgb A1c MFr Bld: 9.2 % — ABNORMAL HIGH (ref 4.6–6.5)

## 2010-10-04 MED ORDER — METHOCARBAMOL 500 MG PO TABS: 500.0000 mg | ORAL_TABLET | Freq: Three times a day (TID) | ORAL | Status: AC

## 2010-10-04 MED ORDER — TRAMADOL HCL 50 MG PO TABS
50.0000 mg | ORAL_TABLET | Freq: Three times a day (TID) | ORAL | Status: DC | PRN
Start: 2010-10-04 — End: 2011-04-03

## 2010-10-04 NOTE — Patient Instructions (Signed)
Today we updated your med list in EPIC...  Today we did your follow up blood work & XRays of your back ...    Please call the PHONE [facility]REE in a few days for your results...    Dial [id number] & when prompted enter your patient number followed by the # symbol...    Your patient number is:  [id number]#  We will arrange for an Orthopedic evaluation of your back & leg pain...  In the meanwhile try the TRAMADOL up to 3 times daily for pain (take it w/ 2Tylenol tabs to potentiate it's effect), and try the ROBAXIN up to 3 times dailt for muscle spasm...  Use a heating pad on your back as needed.Marland KitchenMarland Kitchen

## 2010-10-04 NOTE — Progress Notes (Signed)
Subjective:    Patient ID: [name], female    DOB: 1940-09-23, 70 y.o.   MRN: [id number]  HPI 70 y/o WF here for a follow up visit... she has mult med problems including severe COPD; HBP; CAD w/prev MI & PTCA followed by DrMcDowell; VI; Hyperlipidemia; DM on insulin; Obesity; GERD/ constipation; DJD/ osteoporosis; Anxiety...  ~  October 24, 2009:  45mo ROV- BS are all over the place (up to 400, down to 100) & we discussed adjusting her 70/30 insulin accordingly (her weight is down to 217#)... under alot of stress w/ 56 y/o mother "driving me nuts"... she saw DrMcDowell 2/11- BP sl elev & Lisinopril incr to 20mg /d... currently on 70/30 insulin taking 25u AM & 20uPM> rec incr doses slowly & titrate to BS values (discussed w/ pt, again).  ~  April 24, 2010:  45mo ROV- she has only incr insulin marginally to 25-30u AM & 20-25u PM and todays lab shows FBS=236, A1c=8.8; **we discussed incr her 70/30 insulin to 35u AM & 30u PM & titrate up from there...     BP is controlled on Aten, Lisin, Lasix> 140/82 today & she needs to check more often at home;  she saw DrMcDowell 9/11> HBP/ CAD/ Lipids all stable & no changes made;  she has lost wt down to 215# from peak 258# in 2010 but no wt loss over the last 45mo & we reviewed diet + exercise perscription;  FLP looks good on Simva80 + Fish Oil & meds tol well so we won't make any changes in her regimen...    She is c/o cough, yellow sput, nasal congestion & sneezing, feeling weak & "woozy" w/ mult somatic complaints;  she saw TP 2/6 & given Avelox, Mucinex, Hydromet but has persistant symptoms; **we discussed sinus regimen w/ Saline nasal mist, Mucinex + Fluids, Flonase, plus a brief PredDosepak (next step= ENT referral)...    She also has right knee pain> known degen arthritis & followed by DrNorris for Ortho- given steroid shot 9/11 w/ some transient improvement;  she uses etodolac Prn & has Omep for stomach;  finally asking for refill alprazolam due to  anxiety...  ~  October 04, 2010:  45mo ROV & post hosp visit> she was [facility] 3/15 - 05/26/10 by [facility] w/ COPD exac, GIB from HPylori pos gastritis & ulcers, acute blood loss anemia, chr renal insuffic, etc;  She improved w/ COPD rx, Pred, ACE ch to ARB, Mucinex, etc;  EGD by DrJacobs w/ Ab rx for HPylori & DC NSAIDs, Boniva, etc;  Anemia gradually improved w/ oral Fe therapy;  Renal is stable w/ Creat 1.2-1.4...    She has mult somatic complaints & doesn't feel well> c/o LBP some radiation into legs; she is off prev ASA & Lodine due to GIB, just using Tylenol & topical rubs for pain;  We discussed trying rest, heat, Tramadol w/ Tylenol & refer to Ortho for further eval...    BP control is fair off the ACE now & she didn't bring med bottles to review compliance; she remains on Simva80 + Fish Oil for her Lipids; she has adjusted her insulin up to 30uAM & 25uPM of her Humulin70/30 & she continues on the Metformin; she has not lost any weight; I indicated to her that if we are to help her control her DM & mult medical problems then she will need to see [place] every 3months but she is reluctant due to the number of specialists that she sees.Marland KitchenMarland Kitchen  Problem List:  MAXILLARY SINUSITIS (ICD-461.0)  - hx sinusitis symptoms 3/09 w/ CTSinus neg for acute changes... Rx'd w/ MUCINEX, Nasal Saline, FLONASE...  2/11>> c/o sinusitis & AUGMENTIN perscribed.  COPD (ICD-496) - she has COPD and is an ex-smoker... controlled on NEBULIZER w/ Duoneb Qid + Budesonide Bid, MUCINEX, etc... she has end stage disease w/ severe dyspnea w/ ADLs and no change over the last yr... ~  baseline CXR = COPD, incr heart size, NAD. ~ CT Chest 2/02 showed COPD, Atx, sl scarring, NAD. ~  bronchoscopy 3/02 w/ mucous plugging, no endobr lesions. ~  PFT's in 12/07 showed FVC=0.81 (27%), FEV1=0.54 (22%), %1sec=67, mid-flows=13%pred. ~  CXR 1/10 showed chr changes, cardiomeg, lingular atx. ~  CXR 2/11 showed cardiomeg, bibasilar scarring, NAD. ~  CXR  2/12 showed cardiomeg, COPD,scarring left base, NAD.  HYPERTENSION (ICD-401.9) - controlled on ATENOLOL 50mg /d, LISINOPRIL 20mg /d, LASIX 40mg /d... BP=140/82 and tol well... denies HA, visual changes, CP, palipit, dizziness, syncope, change in dyspnea, edema, etc. ~  Jan10: tried to change ACE to Diovan but too $$$ & requested change back to Lisinopril.  CORONARY ARTERY DISEASE (ICD-414.00) - on ASA 81mg /d... hx of CAD w/ prev MI in 2000, and subseq PTCA's in 2000, & 2001... followed by DrMcDowell at [facility] office- notes reviewed... ~  last cath 2001 w/ PTCA to mid-LAD lesion w/ cutting balloon & brachytherapy for in-stent restenosis. ~  last hosp 6/08 w/ CP- felt to be non-cardiac... ~  last 2DECho 6/08 showed HK of inferoseptal area, EF=55%... ~  last NuclearStressTest 7/08 w/ mod ant/apical/septal infarct, apical HK, no ischemia, EF=?53%...  VENOUS INSUFFICIENCY (ICD-459.81) - low sodium diet, elevation, & Lasix 40-80mg /d... states she "allergic" to TED hose! and refuses to wear them...   HYPERLIPIDEMIA (ICD-272.4) - on ZOCOR 80mg /d + FISH OIL 1000mg /d ... she has been counselled on diet & exercise but unsuccessful... ~  FLP 11/08 on Simva40 showed TChol 150, TG 185, HDL 35, LDL 78 ~  FLP 3/09 on Simva40 showed TChol 139, TG 193, HDL 30, LDL 70... DrMcDowell incr to Simva80 9/09. ~  FLP 2/10 on Simva80 showed TChol 155, TG 137, HDL 43, LDL 85 ~  FLP 10/10 showed TChol 129, TG 96, HDL 39, LDL 71 ~  FLP 8/11 showed TChol 130, TG 152, HDL 30, LDL 70 ~  FLP 2/12 showed TChol 139, TG 96, HDL 40, LDL 80  DM (ICD-250.00) - on HUMULIN 70/30- now taking 30uAM & 25uPM she says & GLUCOPHAGE 500mg Bid (off prev Avandia)... ~  BS=142 & HgA1c=6.6 in Nov08... ~  Labs 3/09 showed BS= 112, HgA1c= 6.8 ~  labs 7/09 showed BS= 197, HgA1c= 7.1.Marland KitchenMarland Kitchen rec- better diet! ~  labs 2/10 showed BS= 134, A1c= 7.0 ~  labs 10/10 showed BS= 151, A1c= 6.9 ~  labs 2/11 showed BS= 239, A1c= 8.1.Marland KitchenMarland Kitchen pt instructed to up  titrate insulin til BS= 120-150 range. ~  labs 8/11 showed BS= 215, A1c= 8.4... again instructed to incr insulin slowly & titrate to BS values. ~  labs 2/12 showed BS= 236, A1c= 8.8.Marland KitchenMarland Kitchen rec to incr 70/30 insulin incrementally... ~  Labs 8/12 showed BS= 199, A1c= 9.2.Marland KitchenMarland Kitchen rec slowly incr her 70/30 insulin doses as discussed.  OBESITY (ICD-278.00) - we discussed diet + exercise program required to lose weight! ~  weight 2008 = 235# ~  weight 2/10 = 250# ~  weight 10/10 = 258# ~  weight 2/11 = 241# ~  weight 8/11 = 217# ~  weight 2/12 = 215# ~  Weight 8/12 = 214#  GERD (ICD-530.81) - on PPI therapy (changed 2/10 per request to OMEPRAZOLE20mg /d)... ~  [facility] 3/12 for COPD exac & was anemic w/ heme pos stool & GIB; EGD by DrJacobs w/ gastritis & prepyloric ulcers, +HPylori- treated & improved.  CONSTIPATION (ICD-564.00) - last colonoscopy was 11/07 by DrJacobs and negative...  DEGENERATIVE JOINT DISEASE (ICD-715.90) - prev CSpine films w/ spondy and biforaminal narrowing... LSSpine films w/ DDD and osteopenia...  ~  she saw DrDrake, Ortho in [place], for shot in right foot. ~  9/11:  she saw DrNorris for shot in right knee. ~  8/12: presents c/o LBP, leg pain; XRay Lumbar spine w/ DDD, narrowing, osteophytes, facet dis, etc; Rx w/ rest, heat, TRAMADOL, ROBAXIN, refer to Ortho.  OSTEOPOROSIS (ICD-733.00) - on BONIVA 150mg /mo, Calcium, Vits... she needs a BMD... ~  labs 2/11 showed Vit D level = 27... OK to change to 1000 u daily. ~  labs 2/12 showed Vit D level = 37... rec to continue 1000u supplement  ANXIETY (ICD-300.00) - uses Alprazolam 0.5mg  as needed...  ANEMIA, MILD (ICD-285.9) ~  labs 2/10 showed Hg= 11.9, Fe= 51... ~  labs 10/10 showed Hg= 11.9, Fe= 50... rec OTC Fe supplement. ~  labs 2/11 showed Hg= 13.4, Fe= 52 (14%sat) ~  labs 2/12 showed Hg= 13.1, MCV= 93 ~  Labs 8/12 showed Hg= 12.0, Fe= 61 (15%sat); rec to continue oral iron...  Health Maintenance - she hasn't seen GYN in yrs  and is asked to re-establish... prev refused Mammograms due to the discomfort & bruising- she will discuss w/ Gyn & get a follow up BMD as well...   Past Surgical History  Procedure Date  . Total abdominal hysterectomy   . Cholecystectomy     Outpatient Encounter Prescriptions as of 10/04/2010  Medication Sig Dispense Refill  . acetaminophen (TYLENOL) 500 MG tablet Take 500 mg by mouth. As needed       . albuterol (PROVENTIL) (2.5 MG/3ML) 0.083% nebulizer solution Take 2.5 mg by nebulization 4 (four) times daily.        Marland Kitchen ALPRAZolam (XANAX) 0.5 MG tablet TAKE ONE-HALF TO ONE TABLET BY MOUTH THREE TIMES DAILY AS NEEDED  90 tablet  2  . Alum & Mag Hydroxide-Simeth (MAGIC MOUTHWASH) SOLN 1 tsp gargle and swallow four times daily as needed for sore throat  120 mL  1  . atenolol (TENORMIN) 50 MG tablet Take by mouth daily.        . budesonide (PULMICORT) 0.25 MG/2ML nebulizer solution Take by nebulization 2 (two) times daily.        . calcium-vitamin D (OSCAL WITH D) 500-200 MG-UNIT per tablet Take 1 tablet by mouth 2 (two) times daily.        . Cholecalciferol (VITAMIN D3) 1000 UNITS CAPS Take by mouth daily.        Marland Kitchen dextromethorphan-guaiFENesin (MUCINEX DM) 30-600 MG per 12 hr tablet Take 2 tablets by mouth every 12 (twelve) hours.        Marland Kitchen etodolac (LODINE) 400 MG tablet TAKE ONE TABLET BY MOUTH EVERY 12 HOURS  60 tablet  3  . ferrous sulfate 325 (65 FE) MG tablet Take by mouth daily with breakfast.        . fluticasone (FLONASE) 50 MCG/ACT nasal spray 2 sprays by Nasal route daily.        . furosemide (LASIX) 40 MG tablet Take 1 tablet (40 mg total) by mouth daily.  30 tablet  6  .  gabapentin (NEURONTIN) 100 MG capsule TAKE THREE CAPSULES BY MOUTH AT BEDTIME  100 capsule  5  . ibandronate (BONIVA) 150 MG tablet Take by mouth every 30 (thirty) days. Take in the morning with a full glass of water, on an empty stomach, and do not take anything else by mouth or lie down for the next 30 min.        . insulin NPH-insulin regular (NOVOLIN 70/30) (70-30) 100 UNIT/ML injection Inject 25 Units into the skin daily with breakfast. And 20  units in the evening      . ipratropium-albuterol (DUONEB) 0.5-2.5 (3) MG/3ML SOLN Take by nebulization 4 (four) times daily.        Marland Kitchen losartan (COZAAR) 50 MG tablet Take 1 tablet (50 mg total) by mouth daily.  30 tablet  11  . metFORMIN (GLUCOPHAGE) 500 MG tablet TAKE ONE TABLET BY MOUTH TWICE DAILY  60 tablet  5  . nitroGLYCERIN (NITROSTAT) 0.4 MG SL tablet Place under the tongue every 5 (five) minutes as needed. May repeat x3       . Omega-3 Fatty Acids (FISH OIL MAXIMUM STRENGTH) 1200 MG CAPS Take by mouth daily.        Marland Kitchen omeprazole (PRILOSEC) 20 MG capsule Take by mouth daily.        . simvastatin (ZOCOR) 80 MG tablet Take by mouth at bedtime.        . vitamin B-12 (CYANOCOBALAMIN) 500 MCG tablet Take 1,000 mcg by mouth daily.       Marland Kitchen DISCONTD: aspirin 81 MG tablet Take by mouth daily.        Marland Kitchen DISCONTD: olmesartan (BENICAR) 20 MG tablet Take 20 mg by mouth daily.          Allergies  Allergen Reactions  . Codeine   . Terbutaline Sulfate     REACTION: INTOL to terbutaline    Current Medications, Allergies, Past Medical History, Past Surgical History, Family History, and Social History were reviewed in [employer] record.    Review of Systems         See HPI - all other systems neg except as noted...   The patient complains of dyspnea on exertion, peripheral edema, muscle weakness, and difficulty walking.  The patient denies anorexia, fever, weight loss, weight gain, vision loss, decreased hearing, hoarseness, chest pain, syncope, prolonged cough, headaches, hemoptysis, abdominal pain, melena, hematochezia, severe indigestion/heartburn, hematuria, incontinence, suspicious skin lesions, transient blindness, depression, unusual weight change, abnormal bleeding, enlarged lymph nodes, and angioedema.    Objective:   Physical  Exam    WD, Obese, 70 y/o WF in NAD... GENERAL:  Alert & oriented; pleasant & cooperative...  HEENT:  [place]/AT, EOM-wnl, PERRLA, EACs-clear, TMs-wnl, NOSE-clear, THROAT-clear & wnl. NECK:  Supple w/ fairROM; no JVD; normal carotid impulses w/o bruits; no thyromegaly or nodules palpated; no lymphadenopathy. CHEST:  Decr BS bilat, few scat rhonchi, without wheezing, rales, or rubs...upper airway psuedo-wheezing HEART:  Regular Rhythm;  gr 1/6 SEM without rubs or gallops detected... ABDOMEN:  Obese, soft & nontender; normal bowel sounds; no organomegaly or masses detected. EXT: without deformities, mild arthritic changes; no varicose veins/ +venous insuffic/ tr edema. Sl tender on palpation over lumbar area & siatic notch... NEURO:  CN's intact; no focal neuro deficits... DERM:  no lesion seen...  DATA REVIEWED:  Lumbar Spine films; Non-fasting labs today; EMR lab flow sheet; prev CXRs etc...   Assessment & Plan:   COPD>  Severe, continue home Oxygen, Nebulizer, Mucinex, &  Fluids;  She will also continue vigorous nasal regimen as discussed...  HBP>  Fair control on Atenolol50, Losartan50, Furosemide40; discussed diet, must get wt down, no sodium...  CAD>  Followed by DrMcDowell in [place] office...  HYPERLIPID>  On Simva80 + Fish Oil; needs better diet & get wt down!  DM>  Control remains poor as she has been unable to diet effectively, lose weight, or incrementally increase her insulin properly to get sugars under control;  Again asked to slowly inc her 70/30 doses by 1-2 u increments until sugars are improved.  OBESITY>  Weight reduction is key to improvement in all areas...  GI> GERD, Ulcers, +HPylori, Constip>  Improved after HPylori Rx & back on Omep 20mg /d...  DJD, LBP, etc>  Lumbar XRay reviewed, trial Tramadol, Robaxin, rest/ heat/ etc... Refer to Ortho for further eval & Rx...  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  Anxiety>   Alprazolam for prn use- refilled...  Anemia>  On Fe daily and Hg is improved (s/p hosp w/ GIB)...

## 2010-10-05 LAB — HEPATIC FUNCTION PANEL
ALT: 18 U/L (ref 0–35)
AST: 16 U/L (ref 0–37)
Albumin: 3.9 g/dL (ref 3.5–5.2)
Total Protein: 6.9 g/dL (ref 6.0–8.3)

## 2010-10-07 ENCOUNTER — Encounter: Payer: Self-pay | Admitting: Pulmonary Disease

## 2010-10-11 ENCOUNTER — Telehealth: Payer: Self-pay | Admitting: Pulmonary Disease

## 2010-10-11 DIAGNOSIS — M1990 Unspecified osteoarthritis, unspecified site: Secondary | ICD-10-CM

## 2010-10-11 NOTE — Telephone Encounter (Signed)
Spoke with patient-she states she would like to see an Ortho dr in [place] instead of [place] [facility](closer for patient to stay in [place]). Appt was made at [facility] for 10-20-10 at 9am. SN please advise if you are okay with this and if any particular Dr you would like patient to see.

## 2010-10-11 NOTE — Telephone Encounter (Signed)
Ok for her to see ortho closer to her home just have the doctor fax ov note.  thanks

## 2010-10-11 NOTE — Telephone Encounter (Addendum)
Spoke with pt and notified of recs per SN. She verbalized understanding Order sent to [facility] for this.

## 2010-10-23 ENCOUNTER — Ambulatory Visit: Payer: Medicare Other | Admitting: Pulmonary Disease

## 2010-10-25 ENCOUNTER — Ambulatory Visit (HOSPITAL_COMMUNITY)
Admission: RE | Admit: 2010-10-25 | Discharge: 2010-10-25 | Disposition: A | Discharge status: Home / Self Care | Payer: Medicare Other | Source: Ambulatory Visit | Attending: Orthopedic Surgery | Admitting: Orthopedic Surgery

## 2010-10-25 DIAGNOSIS — M545 Low back pain, unspecified: Secondary | ICD-10-CM | POA: Insufficient documentation

## 2010-10-25 DIAGNOSIS — J4489 Other specified chronic obstructive pulmonary disease: Secondary | ICD-10-CM | POA: Insufficient documentation

## 2010-10-25 DIAGNOSIS — IMO0001 Reserved for inherently not codable concepts without codable children: Secondary | ICD-10-CM | POA: Insufficient documentation

## 2010-10-25 DIAGNOSIS — M79609 Pain in unspecified limb: Secondary | ICD-10-CM | POA: Insufficient documentation

## 2010-10-25 DIAGNOSIS — J449 Chronic obstructive pulmonary disease, unspecified: Secondary | ICD-10-CM | POA: Insufficient documentation

## 2010-10-25 DIAGNOSIS — I10 Essential (primary) hypertension: Secondary | ICD-10-CM | POA: Insufficient documentation

## 2010-10-25 DIAGNOSIS — E119 Type 2 diabetes mellitus without complications: Secondary | ICD-10-CM | POA: Insufficient documentation

## 2010-10-25 DIAGNOSIS — M6281 Muscle weakness (generalized): Secondary | ICD-10-CM | POA: Insufficient documentation

## 2010-10-25 NOTE — Progress Notes (Addendum)
Physical Therapy Evaluation  Patient Details  Name: [name] MRN: [id number] Date of Birth: [birth year]  Today's Date: 10/25/2010 Time: 4098-1191 Time Calculation (min): 60 min Charges: 1 eval, 15 min TE Visit#: 1 of 8 Re-eval: 11/22/10  Past Medical History:  Past Medical History  Diagnosis Date  . COPD (chronic obstructive pulmonary disease)   . Hyperlipidemia   . Hypertension   . Anemia   . Anxiety   . Arthritis   . CAD (coronary artery disease)   . DM II (diabetes mellitus, type II), controlled   . Osteoporosis   . Venous insufficiency   . Obesity   . Myocardial infarction 2000    AMI   Past Surgical History:  Past Surgical History  Procedure Date  . Total abdominal hysterectomy   . Cholecystectomy     Subjective Symptoms/Limitations Symptoms: Pt reports LBP that started 6 weeks ago that started in her lower back to her stomach and through her thighs and into her knee caps.  She explains it as burning and aching with occiasional stabbing.  Pain is of insidious nature.  Denies history of back pain.  Exercise/Treatments Stretches Bilateral SKTC 30 sec Bilateral Active HS stretch 30 sec Bridging x10 LTR x10 See Lumbar flow sheet for further details  Physical Therapy Assessment and Plan PT Assessment and Plan Clinical Impression Statement: Pt is a 70 y.o. female  referred to PT for LBP.  After examination it was found that the patient has current body structure impairments including: increased pain with radicular pain to left leg, significant gluteal and spinal spasms, decreased LE and core strength, decreased flexibility, impaired balance and impaired posture which are limiting her ability to participate in community and work activities and functions. Pt will benefit from skilled PT service to address the above body structure impairments in order to maximize function in order to improve quality of life. Rehab Potential: Good PT Frequency: Min 3X/week PT Duration:  4 weeks PT Treatment/Interventions: Gait training;Functional mobility training;Therapeutic exercise;Balance training;Neuromuscular re-education;Patient/family education;Other (comment) (manual and modalities to control pain. ) PT Plan: TENS Unit Set Up next visit.  if times allow add: functional squats, heel raises, toe raises, ab sets, clams    Goals PT Short Term Goals Time to Complete Goals: 4 weeks PT Short Term Goal 1: 1.Pt will be Independent in HEP in order to maximize therapeutic effect. PT Short Term Goal 2: 2.Pt will report pain less than 4/10 for 50% of her day while standing PT Short Term Goal 3: 3.Pt will have minimal spasm to hip and gluteal region. PT Short Term Goal 4: 4.Pt will increase LE strength by 1 muscle grade for improved ambulation. PT Short Term Goal 5: 5.Pt will be independent with TENS unit application and set up.  PT Long Term Goals PT Long Term Goal 1: 1.Pt will report pain less than or equal to 3/10 for 75% of her day for improved quality of life. PT Long Term Goal 2: 2.Pt will improve LE and core strength to WNL order to ambulate for 30 minutes in order to shop at the grocery store. Long Term Goal 3: 3.Pt will improve core strength to WNL in order to comfortably stand for 20 minutes in order to complete households ADL's  Long Term Goal 4: 4.Pt will present with lumbar AROM WNL in order to be able to squat and lift a 5lb bag from floor to waist without an increase in pain.  Problem List Patient Active Problem List  Diagnoses  .  DM  . HYPERLIPIDEMIA  . OBESITY  . ANEMIA, MILD  . ANXIETY  . Essential hypertension, benign  . CORONARY ATHEROSCLEROSIS NATIVE CORONARY ARTERY  . VENOUS INSUFFICIENCY  . ACUTE MAXILLARY SINUSITIS  . BRONCHITIS, ACUTE  . COPD  . GERD  . CONSTIPATION  . DEGENERATIVE JOINT DISEASE  . KNEE PAIN, RIGHT  . LEG PAIN  . OSTEOPOROSIS  . GIB (gastrointestinal bleeding)  . LBP (low back pain)  . Low back pain    PT - End of  Session Activity Tolerance: Patient tolerated treatment well   Awa Bachicha 10/25/2010, 2:52 PM  Physician Documentation Your signature is required to indicate approval of the treatment plan as stated above.  Please sign and either send electronically or make a copy of this report for your files and return this physician signed original.   Please mark one 1.__approve of plan  2. ___approve of plan with the following conditions.   ______________________________                                                          _____________________ Physician Signature                                                                                                             Date

## 2010-11-01 ENCOUNTER — Ambulatory Visit (HOSPITAL_COMMUNITY)
Admission: RE | Admit: 2010-11-01 | Discharge: 2010-11-01 | Disposition: A | Discharge status: Home / Self Care | Payer: Medicare Other | Source: Ambulatory Visit

## 2010-11-01 ENCOUNTER — Other Ambulatory Visit: Payer: Self-pay | Admitting: Pulmonary Disease

## 2010-11-01 NOTE — Progress Notes (Signed)
Physical Therapy Treatment Patient Details  Name: [name] MRN: [id number] Date of Birth: [birth year]  Today's Date: 11/01/2010 Time: 0930-1020 Time Calculation (min): 50 min Visit#: 2 of 8 Re-eval: 11/22/10 Charge: therex: 23 min Estim unattended 1 unit Self care: TENS unit set up/education: 25 min  Subjective: Symptoms/Limitations Symptoms: 4-5/10 LBP Pain Assessment Currently in Pain?: Yes Pain Score:   4 Pain Location: Back Pain Orientation: Lower;Left Pain Type: Chronic pain Pain Onset: More than a month ago Pain Frequency: Intermittent Pain Relieving Factors: Laying down Effect of Pain on Daily Activities: Unable to complete daily activities secondary to pain  Precautions/Restrictions     Mobility (including Balance)       Exercise/Treatments Stretches Active Hamstring Stretch: 30 seconds;Limitations;3 reps Active Hamstring Stretch Limitations: BLE Single Knee to Chest Stretch: 3 reps;30 seconds;Limitations Single Knee to Chest Stretch Limitations: BLE Lower Trunk Rotation: 5 reps Lumbar Exercises   Stability Ab Set: Supine;10 reps;5 seconds Machine Exercises    Modalities Modalities:  (TENS unit given/ instructed paperwork complete)  Physical Therapy Assessment and Plan PT Assessment and Plan Clinical Impression Statement: Pt instructed TENS unit, paperwork complete.  Began abd iso with vc for diaphragmatic breathing.  Pt stated LBP relief at end of session. PT Plan: Add: functional squats, heel raises, toe raises,  clams    Goals    Problem List Patient Active Problem List  Diagnoses  . DM  . HYPERLIPIDEMIA  . OBESITY  . ANEMIA, MILD  . ANXIETY  . Essential hypertension, benign  . CORONARY ATHEROSCLEROSIS NATIVE CORONARY ARTERY  . VENOUS INSUFFICIENCY  . ACUTE MAXILLARY SINUSITIS  . BRONCHITIS, ACUTE  . COPD  . GERD  . CONSTIPATION  . DEGENERATIVE JOINT DISEASE  . KNEE PAIN, RIGHT  . LEG PAIN  . OSTEOPOROSIS  . GIB  (gastrointestinal bleeding)  . LBP (low back pain)  . Low back pain    PT - End of Session Activity Tolerance: Patient tolerated treatment well General Behavior During Session: [facility] for tasks performed Cognition: [facility] for tasks performed  [name] 11/01/2010, 11:03 AM

## 2010-11-03 ENCOUNTER — Ambulatory Visit (HOSPITAL_COMMUNITY): Payer: Medicare Other

## 2010-11-07 ENCOUNTER — Ambulatory Visit (HOSPITAL_COMMUNITY)
Admission: RE | Admit: 2010-11-07 | Discharge: 2010-11-07 | Disposition: A | Discharge status: Home / Self Care | Payer: Medicare Other | Source: Ambulatory Visit | Attending: Pulmonary Disease | Admitting: Pulmonary Disease

## 2010-11-07 DIAGNOSIS — M6281 Muscle weakness (generalized): Secondary | ICD-10-CM | POA: Insufficient documentation

## 2010-11-07 DIAGNOSIS — M545 Low back pain, unspecified: Secondary | ICD-10-CM | POA: Insufficient documentation

## 2010-11-07 DIAGNOSIS — J4489 Other specified chronic obstructive pulmonary disease: Secondary | ICD-10-CM | POA: Insufficient documentation

## 2010-11-07 DIAGNOSIS — M79609 Pain in unspecified limb: Secondary | ICD-10-CM | POA: Insufficient documentation

## 2010-11-07 DIAGNOSIS — J449 Chronic obstructive pulmonary disease, unspecified: Secondary | ICD-10-CM | POA: Insufficient documentation

## 2010-11-07 DIAGNOSIS — IMO0001 Reserved for inherently not codable concepts without codable children: Secondary | ICD-10-CM | POA: Insufficient documentation

## 2010-11-07 DIAGNOSIS — E119 Type 2 diabetes mellitus without complications: Secondary | ICD-10-CM | POA: Insufficient documentation

## 2010-11-07 DIAGNOSIS — I10 Essential (primary) hypertension: Secondary | ICD-10-CM | POA: Insufficient documentation

## 2010-11-07 NOTE — Progress Notes (Signed)
Physical Therapy Treatment Patient Details  Name: [name] MRN: [id number] Date of Birth: 10/01/1940  Today's Date: 11/07/2010 Time: 0928-1010 Time Calculation (min): 42 min Visit#: 3 of 8 Re-eval: 11/22/10 Charge: therex 42 min  Subjective: Symptoms/Limitations Symptoms: 4/10 LBP, pt stated pain worse with TENS, pt informed to bring unit with her next session to see if we can help with the different adjustments for pain reduction  Objective:  Exercise/Treatments Active Hamstring Stretch: 30 seconds  Single Knee to Chest Stretch: 3 reps;30  Lower Trunk Rotation: 5 reps 10 seconds ITBand St 3x 30" Supine:  Ab Set: Supine;10 reps;5 seconds functional squats 10 heel raises 10 toe raises 10 clams 10 bridge 10 add iso with ball between knees 10x5" Sidelying:  Hip abduction 10 reps Hip adduction 10 reps  Physical Therapy Assessment and Plan PT Assessment and Plan Clinical Impression Statement: Began lumbar/core strengthening/stretching with min vc for proper tech.  Pt tolerated welll with total therex.  L hamstrings tighter than R.   PT Plan: Continue with current POT.    Goals    Problem List Patient Active Problem List  Diagnoses  . DM  . HYPERLIPIDEMIA  . OBESITY  . ANEMIA, MILD  . ANXIETY  . Essential hypertension, benign  . CORONARY ATHEROSCLEROSIS NATIVE CORONARY ARTERY  . VENOUS INSUFFICIENCY  . ACUTE MAXILLARY SINUSITIS  . BRONCHITIS, ACUTE  . COPD  . GERD  . CONSTIPATION  . DEGENERATIVE JOINT DISEASE  . KNEE PAIN, RIGHT  . LEG PAIN  . OSTEOPOROSIS  . GIB (gastrointestinal bleeding)  . LBP (low back pain)  . Low back pain    PT - End of Session Activity Tolerance: Patient tolerated treatment well General Behavior During Session: [facility] for tasks performed Cognition: [facility] for tasks performed  [name] 11/07/2010, 10:09 AM

## 2010-11-09 ENCOUNTER — Ambulatory Visit (HOSPITAL_COMMUNITY)
Admission: RE | Admit: 2010-11-09 | Discharge: 2010-11-09 | Disposition: A | Discharge status: Home / Self Care | Payer: Medicare Other | Source: Ambulatory Visit | Attending: Pulmonary Disease | Admitting: Pulmonary Disease

## 2010-11-09 ENCOUNTER — Telehealth (HOSPITAL_COMMUNITY): Payer: Self-pay

## 2010-11-13 ENCOUNTER — Telehealth: Payer: Self-pay | Admitting: Pulmonary Disease

## 2010-11-13 DIAGNOSIS — M545 Low back pain: Secondary | ICD-10-CM

## 2010-11-13 NOTE — Telephone Encounter (Signed)
Per SN----vicodin is the strongest we can give.   rec vicodin 5-500  #90    1 po tid prn and refer to the pain clinic.  Called and spoke with pt and she stated that she is ok with the referral to the pain clinic due to having this pain x 2 months and she is not able to stand it any longer. Pt is aware that we will call her tomorrow with this appt.

## 2010-11-13 NOTE — Telephone Encounter (Signed)
Spoke with pt. She is c/o back pain no better. She was seen by ortho and was advised needs surgery, but too risky for her given all other problems. She is doing PT and states that this makes the pain worse. She states that she is taking tramadol with tylenol tid and also robaxin tid with no relief. I advised that she needs to call ortho and she states that she would rather get SN's recs since he started her on these meds. Pls advise thanks

## 2010-11-14 ENCOUNTER — Ambulatory Visit (HOSPITAL_COMMUNITY)
Admission: RE | Admit: 2010-11-14 | Discharge: 2010-11-14 | Disposition: A | Discharge status: Home / Self Care | Payer: Medicare Other | Source: Ambulatory Visit | Attending: Physical Therapy | Admitting: Physical Therapy

## 2010-11-14 NOTE — Progress Notes (Signed)
Physical Therapy Treatment Patient Details  Name: [name] MRN: [id number] Date of Birth: [birth year]  Today's Date: 11/14/2010 Time: 1478-2956 Time Calculation (min): 40 min Charges: 1 estim, 30' TE Visit#: 4 of 8 Re-eval: 11/22/10  Subjective: Symptoms/Limitations Symptoms: I just don't know what to do about this pain.  I think these exercises are making it worse.  i am going to church because I love it, but it makes the back pain even worse sitting on the hard pews. Pt reports her primary care has put her in contact with the pain management clinic.  Pain Assessment Pain Score:   8 Pain Location: Back  Exercise/Treatments Stability Clam: 5 reps;Limitations Clam Limitations: BLE in reclined, heels remained on the mat Bent Knee Raise: Limitations Bent Knee Raise Limitations: 10 in reclined w/HEat and IFES Isometric Hip Flexion: 5 reps;5 seconds;Limitations Isometric Hip Flexion Limitations: Reclined w/IFC w/heat BLE Hip Abduction: 5 reps;5 seconds;Limitations Hip Abduction Limitations: reclicined BLE w/isometric holds w/IFC and heat Single Arm Raise: 5 reps;Limitations Single Arm Raises Limitations: Reclined position on heat and IFC to alternating BUE  Hip Adduction w/ ball in reclined position w/heat 5x5 sec  Modalities Modalities: Archivist Stimulation Location: Low Back Electrical Stimulation Action: IFES Electrical Stimulation Parameters: 80/150Hz , 20 minutes with heat in reclined position, (11 min without exercise, 9 minutes with TE) Electrical Stimulation Goals: Pain  Physical Therapy Assessment and Plan PT Assessment and Plan Clinical Impression Statement: Competed all ther-ex on heat today and patient had a dramatic decrease in pain with reclined ther-ex.  Held standing ther-ex secondary to decrease in pain after reclined ther-ex.  She was able to complete core ther-ex without cueing. E-stim utilized to determine if it  was the same feeling as her home TENS. PT Plan: Cont with standing exercise if pt has decreased pain after heat, attempt ther-ex with just heat and without E-stim (as IFES was the same as TENS unit)    Goals    Problem List Patient Active Problem List  Diagnoses  . DM  . HYPERLIPIDEMIA  . OBESITY  . ANEMIA, MILD  . ANXIETY  . Essential hypertension, benign  . CORONARY ATHEROSCLEROSIS NATIVE CORONARY ARTERY  . VENOUS INSUFFICIENCY  . ACUTE MAXILLARY SINUSITIS  . BRONCHITIS, ACUTE  . COPD  . GERD  . CONSTIPATION  . DEGENERATIVE JOINT DISEASE  . KNEE PAIN, RIGHT  . LEG PAIN  . OSTEOPOROSIS  . GIB (gastrointestinal bleeding)  . LBP (low back pain)  . Low back pain       Lamarcus Spira 11/14/2010, 11:09 AM

## 2010-11-16 ENCOUNTER — Ambulatory Visit (HOSPITAL_COMMUNITY)
Admission: RE | Admit: 2010-11-16 | Discharge: 2010-11-16 | Disposition: A | Discharge status: Home / Self Care | Payer: Medicare Other | Source: Ambulatory Visit

## 2010-11-16 NOTE — Progress Notes (Signed)
Physical Therapy Treatment Patient Details  Name: [name] MRN: [id number] Date of Birth: 1940-03-09  Today's Date: 11/16/2010 Time: 9147-8295 Time Calculation (min): 40 min Visit#: 5 of 8 Re-eval: 11/22/10 Charge: therex 40 min MHPx 1 unit  Subjective: Symptoms/Limitations Symptoms: Felt better after last time.  LBP, R hip and groin pain rated 5/10  Precautions/Restrictions     Mobility (including Balance)       Exercise/Treatments Standing: functional squats 10  heel raises 10  Supine: with MHP Active Hamstring Stretch: 3x 30 seconds  Single Knee to Chest Stretch: 3 reps;30  Lower Trunk Rotation: 5 reps 10 seconds  ITBand St 3x 30"  Ab Set: Supine;10 reps;5 seconds  clams 10  bent knee raises 10x iso hip flexion 10x 5 seconds bridge 10 x add iso with ball between knees 10x5"  Modalities Modalities: Moist Heat Moist Heat Therapy Number Minutes Moist Heat: 30 Minutes Moist Heat Location: Other (comment) (Lumbar)  Physical Therapy Assessment and Plan PT Assessment and Plan Clinical Impression Statement: Supine therex completed in heat in fowler position.  Returned to standing therex with no c/o of increased pain.  Pt did stated R groin pain during R hip iso.  No IFES during session with no c/o at end of session. PT Plan: Progress strength, flexibility, posture, endurance, and continue reducing pain.  Re-eval next week.    Goals    Problem List Patient Active Problem List  Diagnoses  . DM  . HYPERLIPIDEMIA  . OBESITY  . ANEMIA, MILD  . ANXIETY  . Essential hypertension, benign  . CORONARY ATHEROSCLEROSIS NATIVE CORONARY ARTERY  . VENOUS INSUFFICIENCY  . ACUTE MAXILLARY SINUSITIS  . BRONCHITIS, ACUTE  . COPD  . GERD  . CONSTIPATION  . DEGENERATIVE JOINT DISEASE  . KNEE PAIN, RIGHT  . LEG PAIN  . OSTEOPOROSIS  . GIB (gastrointestinal bleeding)  . LBP (low back pain)  . Low back pain    PT - End of Session Activity Tolerance: Patient tolerated  treatment well General Behavior During Session: [facility] for tasks performed Cognition: [facility] for tasks performed  [name] 11/16/2010, 11:08 AM

## 2010-11-19 ENCOUNTER — Other Ambulatory Visit: Payer: Self-pay | Admitting: Pulmonary Disease

## 2010-11-20 ENCOUNTER — Telehealth (HOSPITAL_COMMUNITY): Payer: Self-pay

## 2010-12-01 ENCOUNTER — Ambulatory Visit: Payer: Medicare Other | Admitting: Physical Medicine and Rehabilitation

## 2010-12-01 LAB — HEMOGLOBIN AND HEMATOCRIT, BLOOD: HCT: 37.9

## 2010-12-01 LAB — BASIC METABOLIC PANEL
CO2: 34 — ABNORMAL HIGH
Chloride: 102
GFR calc Af Amer: 45 — ABNORMAL LOW
Sodium: 140

## 2010-12-03 ENCOUNTER — Other Ambulatory Visit: Payer: Self-pay | Admitting: Pulmonary Disease

## 2010-12-18 ENCOUNTER — Encounter: Payer: Self-pay | Admitting: Cardiology

## 2010-12-19 ENCOUNTER — Encounter: Payer: Self-pay | Admitting: Cardiology

## 2010-12-20 ENCOUNTER — Ambulatory Visit (INDEPENDENT_AMBULATORY_CARE_PROVIDER_SITE_OTHER): Payer: Medicare Other | Admitting: Cardiology

## 2010-12-20 ENCOUNTER — Encounter: Payer: Self-pay | Admitting: Cardiology

## 2010-12-20 VITALS — BP 153/86 | HR 74 | Resp 18 | Ht 64.0 in | Wt 211.0 lb

## 2010-12-20 DIAGNOSIS — E782 Mixed hyperlipidemia: Secondary | ICD-10-CM

## 2010-12-20 DIAGNOSIS — I2510 Atherosclerotic heart disease of native coronary artery without angina pectoris: Secondary | ICD-10-CM

## 2010-12-20 LAB — I-STAT 8, (EC8 V) (CONVERTED LAB)
BUN: 29 — ABNORMAL HIGH
Bicarbonate: 29.8 — ABNORMAL HIGH
HCT: 39
Operator id: 257131
pCO2, Ven: 51.1 — ABNORMAL HIGH
pH, Ven: 7.373 — ABNORMAL HIGH

## 2010-12-20 LAB — COMPREHENSIVE METABOLIC PANEL
ALT: 18
AST: 17
Albumin: 3.6
CO2: 30
Calcium: 8.9
Creatinine, Ser: 1.03
GFR calc Af Amer: >60
Sodium: 138
Total Protein: 6.6

## 2010-12-20 LAB — APTT: aPTT: 27

## 2010-12-20 LAB — CBC
Hemoglobin: 11.7 — ABNORMAL LOW
MCV: 92.9
RBC: 3.81 — ABNORMAL LOW
RBC: 3.93
WBC: 4.9
WBC: 6.7

## 2010-12-20 LAB — TROPONIN I: Troponin I: 0.01

## 2010-12-20 LAB — CARDIAC PANEL(CRET KIN+CKTOT+MB+TROPI)
CK, MB: 3.1
Total CK: 108
Total CK: 109
Troponin I: 0.01
Troponin I: 0.01

## 2010-12-20 LAB — CK TOTAL AND CKMB (NOT AT ARMC)
CK, MB: 2.6
Relative Index: 2.4
Total CK: 108

## 2010-12-20 LAB — POCT CARDIAC MARKERS
Myoglobin, poc: 124
Troponin i, poc: <0.05

## 2010-12-20 LAB — B-NATRIURETIC PEPTIDE (CONVERTED LAB): Pro B Natriuretic peptide (BNP): 91

## 2010-12-20 LAB — PROTIME-INR: INR: 0.9

## 2010-12-20 LAB — POCT I-STAT CREATININE: Creatinine, Ser: 1.1

## 2010-12-20 NOTE — Patient Instructions (Signed)
Your physician recommends that you return for lab work in: today  Your physician recommends that you continue on your current medications as directed. Please refer to the Current Medication list given to you today.  Your physician recommends that you schedule a follow-up appointment in: 6 months   

## 2010-12-20 NOTE — Assessment & Plan Note (Signed)
Followup fasting lipid profile, recent liver function tests were normal. She has tolerated high-dose simvastatin for some time now.

## 2010-12-20 NOTE — Assessment & Plan Note (Signed)
Continue medical therapy and observation. 

## 2010-12-20 NOTE — Progress Notes (Signed)
Clinical Summary [name] is a 70 y.o.female presenting for followup. She was seen back in March.  She continues followup with Dr. Kriste Basque, had lab work back in August showing potassium 4.5, BUN 18, creatinine 1.2, AST 16, ALT 18, hemoglobin 12.0, platelets 278, TSH 0.58.  She reports occasional nitroglycerin glycerin use, no progressive angina however. Remains chronically short of breath, on oxygen, NYHA class III symptoms.  She reports no palpitations or syncope. No obvious melena or hematochezia.   Allergies  Allergen Reactions  . Codeine   . Terbutaline Sulfate     REACTION: INTOL to terbutaline    Medication list reviewed.  Past Medical History  Diagnosis Date  . COPD (chronic obstructive pulmonary disease)   . Hyperlipidemia   . Essential hypertension, benign   . Anemia   . Anxiety   . Arthritis   . Coronary atherosclerosis of native coronary artery     PTCA/BMS LAD 2000, PTCA/brachytherapy LAD 2001, LVEF 55%  . Type 2 diabetes mellitus   . Osteoporosis   . Venous insufficiency   . Obesity   . Myocardial infarction     AMI 2000    Past Surgical History  Procedure Date  . Total abdominal hysterectomy   . Cholecystectomy     Family History  Problem Relation Age of Onset  . Coronary artery disease Other     Social History [name] reports that she quit smoking about 12 years ago. Her smoking use included Cigarettes. She has a 30 pack-year smoking history. She does not have any smokeless tobacco history on file. [name] reports that she does not drink alcohol.  Review of Systems Chronic back pain. Otherwise negative except as outlined.  Physical Examination Filed Vitals:   12/20/10 1009  BP: 153/86  Pulse: 74  Resp: 18   Chronically ill-appearing woman in no acute distress, wearing oxygen. HEENT: Conjunctiva and lids normal, oropharynx with moist mucosa. Neck: Supple, no elevated JVP or bruits. Lungs: Diminished breath sounds throughout with slight  end expiratory wheeze. Nonlabored. Cardiac: Regular rate and rhythm, no S3 gallop or rub. Abdomen: Soft, nontender, bowel sounds present. Skin: Warm and dry. Extremities: No pitting edema.  ECG Normal sinus rhythm LAFB, poor anterior R wave progression, nonspecific T wave changes.   Problem List and Plan

## 2010-12-21 ENCOUNTER — Telehealth: Payer: Self-pay | Admitting: Pulmonary Disease

## 2010-12-21 NOTE — Telephone Encounter (Signed)
Called and spoke with pt and she stated that all this started last night.  Cough with grey sputum, coughing at night keeping her up, throat feels raw, headache, lots of nasal congestion.  Pt stated that she feel terrible.  TP please advise.  Pt is requesting that something be called in for her.  thanks  Allergies  Allergen Reactions  . Codeine   . Terbutaline Sulfate     REACTION: INTOL to terbutaline

## 2010-12-22 MED ORDER — AZITHROMYCIN 250 MG PO TABS: ORAL_TABLET | ORAL | Status: AC

## 2010-12-22 NOTE — Telephone Encounter (Signed)
Called and spoke with pt and pt is aware per TP to take mucinex dm bid, fluids and rest, claritin 10mg  daily prn, and saline nasal rinses.  Pt is aware that zpak has been sent to the pharmacy and pt to call back for any problems.  Pt voiced her understanding.

## 2010-12-22 NOTE — Telephone Encounter (Signed)
Pt stated it was her understanding that medication would be called in last night for her.  Pt stated this has not happened yet & would like to know when this will be completed.  Pt can be reached at [phone].  Antionette Fairy

## 2010-12-22 NOTE — Telephone Encounter (Signed)
mucinex dm Twice daily  As needed   Fluids and rest Saline nasal rinses As needed   Claritin 10mg  daily As needed As needed   zpack #1 take as directed , no refills---- hold this as her symptoms sound viral and abx do not work for viruses  Only take if not improving with discolored mucus after few days.

## 2011-01-01 ENCOUNTER — Other Ambulatory Visit: Payer: Self-pay | Admitting: Pulmonary Disease

## 2011-01-18 ENCOUNTER — Other Ambulatory Visit: Payer: Self-pay | Admitting: Pulmonary Disease

## 2011-02-01 ENCOUNTER — Other Ambulatory Visit: Payer: Self-pay | Admitting: Pulmonary Disease

## 2011-02-06 ENCOUNTER — Other Ambulatory Visit (INDEPENDENT_AMBULATORY_CARE_PROVIDER_SITE_OTHER): Payer: Medicare Other

## 2011-02-06 ENCOUNTER — Other Ambulatory Visit: Payer: Self-pay | Admitting: Pulmonary Disease

## 2011-02-06 ENCOUNTER — Ambulatory Visit (INDEPENDENT_AMBULATORY_CARE_PROVIDER_SITE_OTHER): Payer: Medicare Other | Admitting: Pulmonary Disease

## 2011-02-06 ENCOUNTER — Encounter: Payer: Self-pay | Admitting: Pulmonary Disease

## 2011-02-06 DIAGNOSIS — E785 Hyperlipidemia, unspecified: Secondary | ICD-10-CM

## 2011-02-06 DIAGNOSIS — I872 Venous insufficiency (chronic) (peripheral): Secondary | ICD-10-CM

## 2011-02-06 DIAGNOSIS — J449 Chronic obstructive pulmonary disease, unspecified: Secondary | ICD-10-CM

## 2011-02-06 DIAGNOSIS — I10 Essential (primary) hypertension: Secondary | ICD-10-CM

## 2011-02-06 DIAGNOSIS — F411 Generalized anxiety disorder: Secondary | ICD-10-CM

## 2011-02-06 DIAGNOSIS — J4489 Other specified chronic obstructive pulmonary disease: Secondary | ICD-10-CM

## 2011-02-06 DIAGNOSIS — E119 Type 2 diabetes mellitus without complications: Secondary | ICD-10-CM

## 2011-02-06 DIAGNOSIS — E669 Obesity, unspecified: Secondary | ICD-10-CM

## 2011-02-06 DIAGNOSIS — M810 Age-related osteoporosis without current pathological fracture: Secondary | ICD-10-CM

## 2011-02-06 DIAGNOSIS — M545 Low back pain: Secondary | ICD-10-CM

## 2011-02-06 DIAGNOSIS — K219 Gastro-esophageal reflux disease without esophagitis: Secondary | ICD-10-CM

## 2011-02-06 DIAGNOSIS — K5900 Constipation, unspecified: Secondary | ICD-10-CM

## 2011-02-06 DIAGNOSIS — M1990 Unspecified osteoarthritis, unspecified site: Secondary | ICD-10-CM

## 2011-02-06 DIAGNOSIS — I2510 Atherosclerotic heart disease of native coronary artery without angina pectoris: Secondary | ICD-10-CM

## 2011-02-06 LAB — BASIC METABOLIC PANEL
BUN: 17 mg/dL (ref 6–23)
CO2: 30 mEq/L (ref 19–32)
Calcium: 9.4 mg/dL (ref 8.4–10.5)
Creatinine, Ser: 1 mg/dL (ref 0.4–1.2)

## 2011-02-06 LAB — LIPID PANEL
Cholesterol: 154 mg/dL (ref 0–200)
Total CHOL/HDL Ratio: 4
Triglycerides: 221 mg/dL — ABNORMAL HIGH (ref 0.0–149.0)

## 2011-02-06 MED ORDER — ALPRAZOLAM 0.5 MG PO TABS: ORAL_TABLET | ORAL | Status: DC

## 2011-02-06 MED ORDER — AZITHROMYCIN 250 MG PO TABS: ORAL_TABLET | ORAL | Status: AC

## 2011-02-06 MED ORDER — SIMVASTATIN 80 MG PO TABS: 80.0000 mg | ORAL_TABLET | Freq: Every day | ORAL | Status: DC

## 2011-02-06 MED ORDER — NITROGLYCERIN 0.4 MG SL SUBL: 0.4000 mg | SUBLINGUAL_TABLET | SUBLINGUAL | Status: DC | PRN

## 2011-02-06 MED ORDER — INSULIN NPH ISOPHANE & REGULAR (70-30) 100 UNIT/ML ~~LOC~~ SUSP: SUBCUTANEOUS | Status: DC

## 2011-02-06 NOTE — Progress Notes (Signed)
Subjective:    Patient ID: [name], female    DOB: 04/09/1940, 70 y.o.   MRN: [id number]  HPI 70 y/o WF here for a follow up visit... she has mult med problems including severe COPD; HBP; CAD w/prev MI & PTCA followed by DrMcDowell; VI; Hyperlipidemia; DM on insulin; Obesity; GERD/ constipation; DJD/ osteoporosis; Anxiety...  ~  October 24, 2009:  43mo ROV- BS are all over the place (up to 400, down to 100) & we discussed adjusting her 70/30 insulin accordingly (her weight is down to 217#)... under alot of stress w/ 58 y/o mother "driving me nuts"... she saw DrMcDowell 2/11- BP sl elev & Lisinopril incr to 20mg /d... currently on 70/30 insulin taking 25u AM & 20uPM> rec incr doses slowly & titrate to BS values (discussed w/ pt, again).  ~  April 24, 2010:  43mo ROV- she has only incr insulin marginally to 25-30u AM & 20-25u PM and todays lab shows FBS=236, A1c=8.8; **we discussed incr her 70/30 insulin to 35u AM & 30u PM & titrate up from there...     BP is controlled on Aten, Lisin, Lasix> 140/82 today & she needs to check more often at home;  she saw DrMcDowell 9/11> HBP/ CAD/ Lipids all stable & no changes made;  she has lost wt down to 215# from peak 258# in 2010 but no wt loss over the last 43mo & we reviewed diet + exercise perscription;  FLP looks good on Simva80 + Fish Oil & meds tol well so we won't make any changes in her regimen...    She is c/o cough, yellow sput, nasal congestion & sneezing, feeling weak & "woozy" w/ mult somatic complaints;  she saw TP 2/6 & given Avelox, Mucinex, Hydromet but has persistant symptoms; **we discussed sinus regimen w/ Saline nasal mist, Mucinex + Fluids, Flonase, plus a brief PredDosepak (next step= ENT referral)...    She also has right knee pain> known degen arthritis & followed by DrNorris for Ortho- given steroid shot 9/11 w/ some transient improvement;  she uses etodolac Prn & has Omep for stomach;  finally asking for refill alprazolam due to  anxiety...  ~  October 04, 2010:  43mo ROV & post hosp visit> she was [facility] 3/15 - 05/26/10 by [facility] w/ COPD exac, GIB from HPylori pos gastritis & ulcers, acute blood loss anemia, chr renal insuffic, etc;  She improved w/ COPD rx, Pred, ACE ch to ARB, Mucinex, etc;  EGD by DrJacobs w/ Ab rx for HPylori & DC NSAIDs, Boniva, etc;  Anemia gradually improved w/ oral Fe therapy;  Renal is stable w/ Creat 1.2-1.4...    She has mult somatic complaints & doesn't feel well> c/o LBP some radiation into legs; she is off prev ASA & Lodine due to GIB, just using Tylenol & topical rubs for pain;  We discussed trying rest, heat, Tramadol w/ Tylenol & refer to Ortho for further eval...    BP control is fair off the ACE now & she didn't bring med bottles to review compliance; she remains on Simva80 + Fish Oil for her Lipids; she has adjusted her insulin up to 30uAM & 25uPM of her Humulin70/30 & she continues on the Metformin; she has not lost any weight; I indicated to her that if we are to help her control her DM & mult medical problems then she will need to see [place] every 3months but she is reluctant due to the number of specialists that she sees...  ~  February 06, 2011:  80mo ROV & she is c/o some head & chest congestion, cough w/ thick brown sputum, sl moe SOB, denies f/c/s, denies CP, etc;  We reviewed meds> NEBS regularly, Mucinex, Fluids, & she wants ZPak for infection- ok...  She requests refills, declines Flu shot...  Labs today w/ Chol ok but TG 221 & needs better diet; BS 237, A1c 8.0 is actually sl better on Novolin 70/30 taking 25uBid + Metformin 500Bid (we reviewed recs for slowly increasing dose)...    She saw DrMcDowell for Cards f/u 10/12> stable w/ longstanding CAD, prev PTCA, not having angina, mobility limited, & he rec continued med rx & observation...    She saw DrAplington 8/12 w/ LBP spondylosis & spinal stenosis> given PT, poor operative risk, wt loss will help, trial TENS, continue meds and Neurontin...         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 Rx'd w/ MUCINEX, Nasal Saline, FLONASE...  2/11>> c/o sinusitis & AUGMENTIN perscribed.  COPD (ICD-496) - she has COPD and is an ex-smoker... controlled on NEBULIZER w/ Duoneb Qid + Budesonide Bid, MUCINEX, etc... she has end stage disease w/ severe dyspnea w/ ADLs and no change over the last yr... ~  baseline CXR = COPD, incr heart size, NAD. ~ CT Chest 2/02 showed COPD, Atx, sl scarring, NAD. ~  bronchoscopy 3/02 w/ mucous plugging, no endobr lesions. ~  PFT's in 12/07 showed FVC=0.81 (27%), FEV1=0.54 (22%), %1sec=67, mid-flows=13%pred. ~  CXR 1/10 showed chr changes, cardiomeg, lingular atx. ~  CXR 2/11 showed cardiomeg, bibasilar scarring, NAD. ~  CXR 2/12 showed cardiomeg, COPD,scarring left base, NAD.  HYPERTENSION (ICD-401.9) - controlled on ATENOLOL 50mg /d, LOSARTAN 50mg /d, LASIX 40mg /d... BP=146/78 and tol well... denies HA, visual changes, CP, palipit, dizziness, syncope, change in dyspnea, edema, etc. ~  Jan10: tried to change ACE to Diovan but too $$$ & requested change back to Lisinopril==> eventually changed to generic LOSARTAN...  CORONARY ARTERY DISEASE (ICD-414.00) - on ASA 81mg /d... hx of CAD w/ prev MI in 2000, and subseq PTCA's in 2000, & 2001... ~  last cath 2001 w/ PTCA to mid-LAD lesion w/ cutting balloon & brachytherapy for in-stent restenosis. ~  last hosp 6/08 w/ CP- felt to be non-cardiac... ~  last 2DECho 6/08 showed HK of inferoseptal area, EF=55%... ~  last NuclearStressTest 7/08 w/ mod ant/apical/septal infarct, apical HK, no ischemia, EF=?53%... ~  followed by DrMcDowell at [place] office- notes reviewed...  VENOUS INSUFFICIENCY (ICD-459.81) - low sodium diet, elevation, & Lasix 40-80mg /d... states she "allergic" to TED hose! and refuses to wear them...   HYPERLIPIDEMIA (ICD-272.4) - on ZOCOR 80mg /d + FISH OIL 1000mg /d ... she has been  counselled on diet & exercise but unsuccessful... ~  FLP 11/08 on Simva40 showed TChol 150, TG 185, HDL 35, LDL 78 ~  FLP 3/09 on Simva40 showed TChol 139, TG 193, HDL 30, LDL 70... DrMcDowell incr to Simva80 9/09. ~  FLP 2/10 on Simva80 showed TChol 155, TG 137, HDL 43, LDL 85 ~  FLP 10/10 showed TChol 129, TG 96, HDL 39, LDL 71 ~  FLP 8/11 showed TChol 130, TG 152, HDL 30, LDL 70 ~  FLP 2/12 showed TChol 139, TG 96, HDL 40, LDL 80 ~  FLP 12/12 on Simva80 showed TChol 154, TG 221, HDL 39, LDL 81... LFTs remain wnl; rec> better low fat diet...  DM (ICD-250.00) - on HUMULIN 70/30- now taking  25uAM & 25uPM she says & GLUCOPHAGE 500mg Bid (off prev Avandia)... ~  BS=142 & HgA1c=6.6 in Nov08... ~  Labs 3/09 showed BS= 112, HgA1c= 6.8 ~  labs 7/09 showed BS= 197, HgA1c= 7.1.Marland KitchenMarland Kitchen rec- better diet! ~  labs 2/10 showed BS= 134, A1c= 7.0 ~  labs 10/10 showed BS= 151, A1c= 6.9 ~  labs 2/11 showed BS= 239, A1c= 8.1.Marland KitchenMarland Kitchen pt instructed to up titrate insulin til BS= 120-150 range. ~  labs 8/11 showed BS= 215, A1c= 8.4... again instructed to incr insulin slowly & titrate to BS values. ~  labs 2/12 showed BS= 236, A1c= 8.8.Marland KitchenMarland Kitchen rec to incr 70/30 insulin incrementally... ~  Labs 8/12 showed BS= 199, A1c= 9.2.Marland KitchenMarland Kitchen rec slowly incr her 70/30 insulin doses as discussed. ~  Labs 12/12 on 70/30 Insulin 25uBid + Metform500Bid showed BS= 237, A1c=8.0.Marland KitchenMarland Kitchen We discussed incr to 30uAM & 30uPM...  OBESITY (ICD-278.00) - we discussed diet + exercise program required to lose weight! ~  weight 2008 = 235# ~  weight 2/10 = 250# ~  weight 10/10 = 258# ~  weight 2/11 = 241# ~  weight 8/11 = 217# ~  weight 2/12 = 215# ~  Weight 8/12 = 214# ~  Weight 12/12 = 209#  GERD (ICD-530.81) - on PPI therapy (changed 2/10 per request to OMEPRAZOLE20mg /d)... ~  [facility] 3/12 for COPD exac & was anemic w/ heme pos stool & GIB; EGD by DrJacobs w/ gastritis & prepyloric ulcers, +HPylori- treated & improved.  CONSTIPATION (ICD-564.00) - last  colonoscopy was 11/07 by DrJacobs and negative...  DEGENERATIVE JOINT DISEASE (ICD-715.90) - prev CSpine films w/ spondy and biforaminal narrowing... LSSpine films w/ DDD and osteopenia...  ~  she saw DrDrake, Ortho in [place], for shot in right foot. ~  9/11:  she saw DrNorris for shot in right knee. ~  8/12: presents c/o LBP, leg pain; XRay Lumbar spine w/ DDD, narrowing, osteophytes, facet dis, etc; Rx w/ rest, heat, TRAMADOL, ROBAXIN, refer to Ortho. ~  She has followed up w/ DrApplington> not a surg candidate for her lumbar spondylosis & sp stenosis...  OSTEOPOROSIS (ICD-733.00) - on BONIVA 150mg /mo, Calcium, Vits... she needs a BMD... ~  labs 2/11 showed Vit D level = 27... OK to change to 1000 u daily. ~  labs 2/12 showed Vit D level = 37... rec to continue 1000u supplement  ANXIETY (ICD-300.00) - uses Alprazolam 0.5mg  as needed...  ANEMIA, MILD (ICD-285.9) ~  labs 2/10 showed Hg= 11.9, Fe= 51... ~  labs 10/10 showed Hg= 11.9, Fe= 50... rec OTC Fe supplement. ~  labs 2/11 showed Hg= 13.4, Fe= 52 (14%sat) ~  labs 2/12 showed Hg= 13.1, MCV= 93 ~  Labs 8/12 showed Hg= 12.0, Fe= 61 (15%sat); rec to continue oral iron...  Health Maintenance - she hasn't seen GYN in yrs and is asked to re-establish... prev refused Mammograms due to the discomfort & bruising- she will discuss w/ Gyn & get a follow up BMD as well...   Past Surgical History  Procedure Date  . Total abdominal hysterectomy   . Cholecystectomy     Outpatient Encounter Prescriptions as of 02/06/2011  Medication Sig Dispense Refill  . acetaminophen (TYLENOL) 500 MG tablet Take 500 mg by mouth. As needed       . albuterol (PROVENTIL) (2.5 MG/3ML) 0.083% nebulizer solution Take 2.5 mg by nebulization 4 (four) times daily.        Marland Kitchen ALPRAZolam (XANAX) 0.5 MG tablet TAKE ONE-HALF TO ONE TABLET BY MOUTH THREE TIMES  DAILY AS NEEDED  90 tablet  2  . Alum & Mag Hydroxide-Simeth (MAGIC MOUTHWASH) SOLN 1 tsp gargle and swallow four  times daily as needed for sore throat  120 mL  1  . atenolol (TENORMIN) 50 MG tablet Take by mouth daily.        Marland Kitchen BONIVA 150 MG tablet TAKE 1 TABLET BY MOUTH ONCE A MONTH  1 each  11  . budesonide (PULMICORT) 0.25 MG/2ML nebulizer solution Take by nebulization 2 (two) times daily.        . calcium-vitamin D (OSCAL WITH D) 500-200 MG-UNIT per tablet Take 1 tablet by mouth 2 (two) times daily.        . Cholecalciferol (VITAMIN D3) 1000 UNITS CAPS Take by mouth daily.        Marland Kitchen dextromethorphan-guaiFENesin (MUCINEX DM) 30-600 MG per 12 hr tablet Take 2 tablets by mouth every 12 (twelve) hours.        Marland Kitchen etodolac (LODINE) 400 MG tablet TAKE ONE TABLET BY MOUTH EVERY 12 HOURS  60 tablet  3  . ferrous sulfate 325 (65 FE) MG tablet Take by mouth daily with breakfast.        . fluticasone (FLONASE) 50 MCG/ACT nasal spray USE TWO SPRAY EACH NOSTRIL TWICE DAILY  16 g  5  . furosemide (LASIX) 40 MG tablet Take 1 tablet (40 mg total) by mouth daily.  30 tablet  6  . gabapentin (NEURONTIN) 100 MG capsule TAKE THREE CAPSULES BY MOUTH AT BEDTIME  100 capsule  5  . insulin NPH-insulin regular (HUMULIN 70/30) (70-30) 100 UNIT/ML injection        . ipratropium-albuterol (DUONEB) 0.5-2.5 (3) MG/3ML SOLN Take by nebulization 4 (four) times daily.        Marland Kitchen losartan (COZAAR) 50 MG tablet Take 1 tablet (50 mg total) by mouth daily.  30 tablet  11  . metFORMIN (GLUCOPHAGE) 500 MG tablet TAKE ONE TABLET BY MOUTH TWICE DAILY  60 tablet  6  . nitroGLYCERIN (NITROSTAT) 0.4 MG SL tablet Place under the tongue every 5 (five) minutes as needed. May repeat x3       . Omega-3 Fatty Acids (FISH OIL MAXIMUM STRENGTH) 1200 MG CAPS Take by mouth daily.        Marland Kitchen omeprazole (PRILOSEC) 20 MG capsule TAKE ONE CAPSULE BY MOUTH EVERY DAY 30 MINUTES BEFORE A MEAL  30 capsule  11  . simvastatin (ZOCOR) 80 MG tablet Take by mouth at bedtime.        . traMADol (ULTRAM) 50 MG tablet Take 1 tablet (50 mg total) by mouth every 8 (eight) hours as  needed for pain.  90 tablet  5  . vitamin B-12 (CYANOCOBALAMIN) 500 MCG tablet Take 1,000 mcg by mouth daily.       Marland Kitchen DISCONTD: HUMULIN 70/30 (70-30) 100 UNIT/ML injection INJECT 25 UNITS SUBCUTANEOUSLY IN THE MORNING AND 20 UNITS SUBCUTANEOUSLY IN THE EVENING.  10 mL  5  . DISCONTD: aspirin 325 MG EC tablet Take 325 mg by mouth daily.           Allergies  Allergen Reactions  . Codeine   . Terbutaline Sulfate     REACTION: INTOL to terbutaline    Current Medications, Allergies, Past Medical History, Past Surgical History, Family History, and Social History were reviewed in [employer] record.    Review of Systems         See HPI - all other systems neg except as  noted...   The patient complains of dyspnea on exertion, peripheral edema, muscle weakness, and difficulty walking.  The patient denies anorexia, fever, weight loss, weight gain, vision loss, decreased hearing, hoarseness, chest pain, syncope, prolonged cough, headaches, hemoptysis, abdominal pain, melena, hematochezia, severe indigestion/heartburn, hematuria, incontinence, suspicious skin lesions, transient blindness, depression, unusual weight change, abnormal bleeding, enlarged lymph nodes, and angioedema.    Objective:   Physical Exam    WD, Obese, 71 y/o WF in NAD... GENERAL:  Alert & oriented; pleasant & cooperative...  HEENT:  [place]/AT, EOM-wnl, PERRLA, EACs-clear, TMs-wnl, NOSE-clear, THROAT-clear & wnl. NECK:  Supple w/ fairROM; no JVD; normal carotid impulses w/o bruits; no thyromegaly or nodules palpated; no lymphadenopathy. CHEST:  Decr BS bilat, few scat rhonchi, without wheezing, rales, or rubs...upper airway psuedo-wheezing HEART:  Regular Rhythm;  gr 1/6 SEM without rubs or gallops detected... ABDOMEN:  Obese, soft & nontender; normal bowel sounds; no organomegaly or masses detected. EXT: without deformities, mild arthritic changes; no varicose veins/ +venous insuffic/ tr edema. Sl tender  on palpation over lumbar area & siatic notch... NEURO:  CN's intact; no focal neuro deficits... DERM:  no lesion seen...  RADIOLOGY DATA:  Reviewed in the EPIC EMR & discussed w/ the patient...  LABORATORY DATA:  Reviewed in the EPIC EMR & discussed w/ the patient...   Assessment & Plan:   COPD>  Severe, continue home Oxygen, Nebulizer, Mucinex, & Fluids;  We will Rx w/ ZPak & reviewed her max regimen; She will also continue vigorous nasal regimen as discussed...  HBP>  Fair control on Atenolol50, Losartan50, Furosemide40; discussed diet, must get wt down, no sodium...  CAD>  Followed by DrMcDowell in [place] office, no angina, stable clinically, continue medical management...  HYPERLIPID>  On Simva80 + Fish Oil; needs better diet & get wt down!  DM>  Control remains poor as she has been unable to diet effectively, lose weight, or incrementally increase her insulin properly to get sugars under control;  Again asked to slowly inc her 70/30 doses by 1-2 u increments until she is taking 30uBid.  OBESITY>  Weight reduction is key to improvement in all areas...  GI> GERD, Ulcers, +HPylori, Constip>  Improved after HPylori Rx & back on Omep 20mg /d...  DJD, LBP, etc>  Lumbar XRay reviewed, trial Tramadol, Robaxin, rest/ heat/ etc... Refer to Ortho for further eval & Rx...  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  Anxiety>  Alprazolam for prn use- refilled...  Anemia>  On Fe daily and Hg is improved (s/p hosp w/ GIB)...

## 2011-02-06 NOTE — Patient Instructions (Signed)
Today we updated your med list in our EPIC system...    Continue your current medications the same...  We wrote a new prescription for a ZPAK to take as needed for infection...    We refilled your other meds per request...  Call for any questions...  Let's plan a follw up visit in 4-6 months.Marland KitchenMarland Kitchen

## 2011-02-20 ENCOUNTER — Other Ambulatory Visit: Payer: Self-pay | Admitting: Pulmonary Disease

## 2011-03-02 ENCOUNTER — Encounter: Payer: Self-pay | Admitting: Pulmonary Disease

## 2011-04-03 ENCOUNTER — Other Ambulatory Visit: Payer: Self-pay | Admitting: Pulmonary Disease

## 2011-04-30 ENCOUNTER — Other Ambulatory Visit: Payer: Self-pay | Admitting: Pulmonary Disease

## 2011-05-08 ENCOUNTER — Telehealth: Payer: Self-pay | Admitting: Pulmonary Disease

## 2011-05-08 MED ORDER — MAGIC MOUTHWASH: ORAL | Status: DC

## 2011-05-08 NOTE — Telephone Encounter (Signed)
Called and spoke with Selena Batten at [facility].  She states she received rx today for Magic Mouthwash but the order went sent was "very strange."  Informed Kim that SN wanted just the standard Magic Mouthwash.  Kim verbalized understanding and nothing further needed.

## 2011-05-08 NOTE — Telephone Encounter (Signed)
Pt informed that refill for MMW was sent to pharmacy.

## 2011-06-07 ENCOUNTER — Other Ambulatory Visit (INDEPENDENT_AMBULATORY_CARE_PROVIDER_SITE_OTHER): Payer: Medicare Other

## 2011-06-07 ENCOUNTER — Encounter: Payer: Self-pay | Admitting: Pulmonary Disease

## 2011-06-07 ENCOUNTER — Ambulatory Visit (INDEPENDENT_AMBULATORY_CARE_PROVIDER_SITE_OTHER): Payer: Medicare Other | Admitting: Pulmonary Disease

## 2011-06-07 VITALS — BP 134/86 | HR 78 | Temp 98.3°F | Ht 64.5 in | Wt 204.2 lb

## 2011-06-07 DIAGNOSIS — E669 Obesity, unspecified: Secondary | ICD-10-CM

## 2011-06-07 DIAGNOSIS — F411 Generalized anxiety disorder: Secondary | ICD-10-CM

## 2011-06-07 DIAGNOSIS — E119 Type 2 diabetes mellitus without complications: Secondary | ICD-10-CM

## 2011-06-07 DIAGNOSIS — I2510 Atherosclerotic heart disease of native coronary artery without angina pectoris: Secondary | ICD-10-CM

## 2011-06-07 DIAGNOSIS — M810 Age-related osteoporosis without current pathological fracture: Secondary | ICD-10-CM

## 2011-06-07 DIAGNOSIS — M1990 Unspecified osteoarthritis, unspecified site: Secondary | ICD-10-CM

## 2011-06-07 DIAGNOSIS — J449 Chronic obstructive pulmonary disease, unspecified: Secondary | ICD-10-CM

## 2011-06-07 DIAGNOSIS — E785 Hyperlipidemia, unspecified: Secondary | ICD-10-CM

## 2011-06-07 DIAGNOSIS — J4489 Other specified chronic obstructive pulmonary disease: Secondary | ICD-10-CM

## 2011-06-07 DIAGNOSIS — K219 Gastro-esophageal reflux disease without esophagitis: Secondary | ICD-10-CM

## 2011-06-07 DIAGNOSIS — I10 Essential (primary) hypertension: Secondary | ICD-10-CM

## 2011-06-07 LAB — CBC WITH DIFFERENTIAL/PLATELET
Basophils Absolute: 0 10*3/uL (ref 0.0–0.1)
Eosinophils Absolute: 0.2 10*3/uL (ref 0.0–0.7)
Hemoglobin: 11.6 g/dL — ABNORMAL LOW (ref 12.0–15.0)
Lymphocytes Relative: 30.1 % (ref 12.0–46.0)
Lymphs Abs: 1.8 10*3/uL (ref 0.7–4.0)
MCHC: 33 g/dL (ref 30.0–36.0)
Neutro Abs: 3.6 10*3/uL (ref 1.4–7.7)
Platelets: 216 10*3/uL (ref 150.0–400.0)
RDW: 12.2 % (ref 11.5–14.6)

## 2011-06-07 LAB — HEMOGLOBIN A1C: Hgb A1c MFr Bld: 7.9 % — ABNORMAL HIGH (ref 4.6–6.5)

## 2011-06-07 LAB — BASIC METABOLIC PANEL
BUN: 19 mg/dL (ref 6–23)
CO2: 32 mEq/L (ref 19–32)
Calcium: 9.4 mg/dL (ref 8.4–10.5)
Creatinine, Ser: 1 mg/dL (ref 0.4–1.2)
GFR: 58.12 mL/min — ABNORMAL LOW (ref >60.00)
Glucose, Bld: 202 mg/dL — ABNORMAL HIGH (ref 70–99)
Sodium: 141 mEq/L (ref 135–145)

## 2011-06-07 MED ORDER — IPRATROPIUM BROMIDE 0.02 % IN SOLN: 500.0000 ug | Freq: Four times a day (QID) | RESPIRATORY_TRACT | Status: DC

## 2011-06-07 MED ORDER — BUDESONIDE 0.25 MG/2ML IN SUSP: 0.2500 mg | Freq: Two times a day (BID) | RESPIRATORY_TRACT | Status: DC

## 2011-06-07 MED ORDER — ALBUTEROL SULFATE (2.5 MG/3ML) 0.083% IN NEBU: 2.5000 mg | INHALATION_SOLUTION | Freq: Four times a day (QID) | RESPIRATORY_TRACT | Status: DC

## 2011-06-07 NOTE — Progress Notes (Addendum)
Subjective:    Patient ID: [name], female    DOB: [birth year], 71 y.o.   MRN: [id number]  HPI 71 y/o WF here for a follow up visit... [name] has 13 grandchildren & 18 great-grands!  She also has mult med problems including severe COPD; HBP; CAD w/prev MI & PTCA followed by DrMcDowell; VI; Hyperlipidemia; DM on insulin; Obesity; GERD/ constipation; DJD/ osteoporosis; Anxiety...  ~  October 24, 2009:  80mo ROV- BS are all over the place (up to 400, down to 100) & we discussed adjusting her 70/30 insulin accordingly (her weight is down to 217#)... under alot of stress w/ 74 y/o mother "driving me nuts"... she saw DrMcDowell 2/11- BP sl elev & Lisinopril incr to 20mg /d... currently on 70/30 insulin taking 25u AM & 20uPM> rec incr doses slowly & titrate to BS values (discussed w/ pt, again).  ~  April 24, 2010:  80mo ROV- she has only incr insulin marginally to 25-30u AM & 20-25u PM and todays lab shows FBS=236, A1c=8.8> we discussed incr her 70/30 insulin to 35u AM & 30u PM & titrate up from there...     BP is controlled on Aten, Lisin, Lasix> 140/82 today & she needs to check more often at home;  she saw DrMcDowell 9/11> HBP/ CAD/ Lipids all stable & no changes made;  she has lost wt down to 215# from peak 258# in 2010 but no wt loss over the last 80mo & we reviewed diet + exercise perscription;  FLP looks good on Simva80 + Fish Oil & meds tol well so we won't make any changes in her regimen...    She is c/o cough, yellow sput, nasal congestion & sneezing, feeling weak & "woozy" w/ mult somatic complaints;  she saw TP 2/6 & given Avelox, Mucinex, Hydromet but has persistant symptoms; **we discussed sinus regimen w/ Saline nasal mist, Mucinex + Fluids, Flonase, plus a brief PredDosepak (next step= ENT referral)...    She also has right knee pain> known degen arthritis & followed by DrNorris for Ortho- given steroid shot 9/11 w/ some transient improvement;  she uses etodolac Prn & has Omep for stomach;   finally asking for refill alprazolam due to anxiety...  ~  October 04, 2010:  80mo ROV & post hosp visit> she was [facility] 3/15 - 05/26/10 by [facility] w/ COPD exac, GIB from HPylori pos gastritis & ulcers, acute blood loss anemia, chr renal insuffic, etc;  She improved w/ COPD rx, Pred, ACE ch to ARB, Mucinex, etc;  EGD by DrJacobs w/ Ab rx for HPylori & DC NSAIDs, Boniva, etc;  Anemia gradually improved w/ oral Fe therapy;  Renal is stable w/ Creat 1.2-1.4...    She has mult somatic complaints & doesn't feel well> c/o LBP some radiation into legs; she is off prev ASA & Lodine due to GIB, just using Tylenol & topical rubs for pain;  We discussed trying rest, heat, Tramadol w/ Tylenol & refer to Ortho for further eval...    BP control is fair off the ACE now & she didn't bring med bottles to review compliance; she remains on Simva80 + Fish Oil for her Lipids; she has adjusted her insulin up to 30uAM & 25uPM of her Humulin70/30 & she continues on the Metformin; she has not lost any weight; I indicated to her that if we are to help her control her DM & mult medical problems then she will need to see [place] every 3months but she is reluctant due  to the number of specialists that she sees...  ~  February 06, 2011:  49mo ROV & she is c/o some head & chest congestion, cough w/ thick brown sputum, sl moe SOB, denies f/c/s, denies CP, etc;  We reviewed meds> NEBS regularly, Mucinex, Fluids, & she wants ZPak for infection- ok...  She requests refills, declines Flu shot...  Labs today w/ Chol ok but TG 221 & needs better diet; BS 237, A1c 8.0 is actually sl better on Novolin 70/30 taking 25uBid + Metformin 500Bid (we reviewed recs for slowly increasing dose)...    She saw DrMcDowell for Cards f/u 10/12> stable w/ longstanding CAD, prev PTCA, not having angina, mobility limited, & he rec continued med rx & observation...    She saw DrAplington 8/12 w/ LBP spondylosis & spinal stenosis> given PT, poor operative risk, wt loss will help,  trial TENS, continue meds and Neurontin...  ~  June 07, 2011:  49mo ROV & [name] notes "I'm not doin" c/o depressed (Mother died recently) & angry at [facility]- wants another DME company;  States that she's hurting all over all the time...    She has severe COPD on O2, NEBS w/ AlbutQid & BudesBid, Mucinex, etc; breathing is at baseline DOE w/ ADLs & no real change...    BP controlled on 3 med regimen & she denies CP, palpit, dizzy/syncope, edema, etc...    Lipids under control w/ Simva80 & tol well; FLP 12/12 was ok x for elev TG & we reviewed low fat diet restriction...  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 Rx'd w/ MUCINEX, Nasal Saline, FLONASE...  2/11>> c/o sinusitis & AUGMENTIN perscribed.  COPD (ICD-496) - she has COPD and is an ex-smoker... controlled on NEBULIZER w/ Duoneb Qid + Budesonide Bid, MUCINEX, etc... she has end stage disease w/ severe dyspnea w/ ADLs and no change over the last yr... ~  baseline CXR = COPD, incr heart size, NAD. ~ CT Chest 2/02 showed COPD, Atx, sl scarring, NAD. ~  bronchoscopy 3/02 w/ mucous plugging, no endobr lesions. ~  PFT's in 12/07 showed FVC=0.81 (27%), FEV1=0.54 (22%), %1sec=67, mid-flows=13%pred. ~  CXR 1/10 showed chr changes, cardiomeg, lingular atx. ~  CXR 2/11 showed cardiomeg, bibasilar scarring, NAD. ~  CXR 2/12 showed cardiomeg, COPD,scarring left base, NAD.  HYPERTENSION (ICD-401.9) - controlled on ATENOLOL 50mg /d, LOSARTAN 50mg /d, LASIX 40mg /d... BP=146/78 and tol well... denies HA, visual changes, CP, palipit, dizziness, syncope, change in dyspnea, edema, etc. ~  Jan10: tried to change ACE to Diovan but too $$$ & requested change back to Lisinopril==> eventually changed to generic LOSARTAN...  CORONARY ARTERY DISEASE (ICD-414.00) - on ASA 81mg /d... hx of CAD w/ prev MI in 2000, and subseq PTCA's in  2000, & 2001... ~  last cath 2001 w/ PTCA to mid-LAD lesion w/ cutting balloon & brachytherapy for in-stent restenosis. ~  last hosp 6/08 w/ CP- felt to be non-cardiac... ~  last 2DECho 6/08 showed HK of inferoseptal area, EF=55%... ~  last NuclearStressTest 7/08 w/ mod ant/apical/septal infarct, apical HK, no ischemia, EF=?53%... ~  followed by [facility]McDowell at [facility]- notes reviewed...  VENOUS INSUFFICIENCY (ICD-459.81) - low sodium diet, elevation, & Lasix 40-80mg /d... states she "allergic" to TED hose! and refuses to wear them...   HYPERLIPIDEMIA (ICD-272.4) - on ZOCOR 80mg /d + FISH  OIL 1000mg /d ... she has been counselled on diet & exercise but unsuccessful... ~  FLP 11/08 on Simva40 showed TChol 150, TG 185, HDL 35, LDL 78 ~  FLP 3/09 on Simva40 showed TChol 139, TG 193, HDL 30, LDL 70... DrMcDowell incr to Simva80 9/09. ~  FLP 2/10 on Simva80 showed TChol 155, TG 137, HDL 43, LDL 85 ~  FLP 10/10 showed TChol 129, TG 96, HDL 39, LDL 71 ~  FLP 8/11 showed TChol 130, TG 152, HDL 30, LDL 70 ~  FLP 2/12 showed TChol 139, TG 96, HDL 40, LDL 80 ~  FLP 12/12 on Simva80 showed TChol 154, TG 221, HDL 39, LDL 81... LFTs remain wnl; rec> better low fat diet...  DM (ICD-250.00) - on HUMULIN 70/30- now taking 25uAM & 25uPM she says & GLUCOPHAGE 500mg Bid (off prev Avandia)... ~  BS=142 & HgA1c=6.6 in Nov08... ~  Labs 3/09 showed BS= 112, HgA1c= 6.8 ~  labs 7/09 showed BS= 197, HgA1c= 7.1.Marland KitchenMarland Kitchen rec- better diet! ~  labs 2/10 showed BS= 134, A1c= 7.0 ~  labs 10/10 showed BS= 151, A1c= 6.9 ~  labs 2/11 showed BS= 239, A1c= 8.1.Marland KitchenMarland Kitchen pt instructed to up titrate insulin til BS= 120-150 range. ~  labs 8/11 showed BS= 215, A1c= 8.4... again instructed to incr insulin slowly & titrate to BS values. ~  labs 2/12 showed BS= 236, A1c= 8.8.Marland KitchenMarland Kitchen rec to incr 70/30 insulin incrementally... ~  Labs 8/12 showed BS= 199, A1c= 9.2.Marland KitchenMarland Kitchen rec slowly incr her 70/30 insulin doses as discussed. ~  Labs 12/12 on 70/30  Insulin 25uBid + Metform500Bid showed BS= 237, A1c=8.0.Marland KitchenMarland Kitchen We discussed incr to 30uAM & 30uPM...  OBESITY (ICD-278.00) - we discussed diet + exercise program required to lose weight! ~  weight 2008 = 235# ~  weight 2/10 = 250# ~  weight 10/10 = 258# ~  weight 2/11 = 241# ~  weight 8/11 = 217# ~  weight 2/12 = 215# ~  Weight 8/12 = 214# ~  Weight 12/12 = 209#  GERD (ICD-530.81) - on PPI therapy (changed 2/10 per request to OMEPRAZOLE20mg /d)... ~  [facility] 3/12 for COPD exac & was anemic w/ heme pos stool & GIB; EGD by DrJacobs w/ gastritis & prepyloric ulcers, +HPylori- treated & improved.  CONSTIPATION (ICD-564.00) - last colonoscopy was 11/07 by DrJacobs and negative...  DEGENERATIVE JOINT DISEASE (ICD-715.90) - prev CSpine films w/ spondy and biforaminal narrowing... LSSpine films w/ DDD and osteopenia...  ~  she saw DrDrake, Ortho in [place], for shot in right foot. ~  9/11:  she saw DrNorris for shot in right knee. ~  8/12: presents c/o LBP, leg pain; XRay Lumbar spine w/ DDD, narrowing, osteophytes, facet dis, etc; Rx w/ rest, heat, TRAMADOL, ROBAXIN, refer to Ortho. ~  She has followed up w/ DrApplington> not a surg candidate for her lumbar spondylosis & sp stenosis...  OSTEOPOROSIS (ICD-733.00) - on BONIVA 150mg /mo, Calcium, Vits... she needs a BMD... ~  labs 2/11 showed Vit D level = 27... OK to change to 1000 u daily. ~  labs 2/12 showed Vit D level = 37... rec to continue 1000u supplement  ANXIETY (ICD-300.00) - uses Alprazolam 0.5mg  as needed...  ANEMIA, MILD (ICD-285.9) ~  labs 2/10 showed Hg= 11.9, Fe= 51... ~  labs 10/10 showed Hg= 11.9, Fe= 50... rec OTC Fe supplement. ~  labs 2/11 showed Hg= 13.4, Fe= 52 (14%sat) ~  labs 2/12 showed Hg= 13.1, MCV= 93 ~  Labs 8/12 showed Hg= 12.0, Fe= 61 (  15%sat); rec to continue oral iron...  Health Maintenance - she hasn't seen GYN in yrs and is asked to re-establish... prev refused Mammograms due to the discomfort & bruising- she will  discuss w/ Gyn & get a follow up BMD as well...   Past Surgical History  Procedure Date  . Total abdominal hysterectomy   . Cholecystectomy     Outpatient Encounter Prescriptions as of 06/07/2011  Medication Sig Dispense Refill  . acetaminophen (TYLENOL) 500 MG tablet Take 500 mg by mouth. As needed       . albuterol (PROVENTIL) (2.5 MG/3ML) 0.083% nebulizer solution Take 2.5 mg by nebulization 4 (four) times daily.        Marland Kitchen ALPRAZolam (XANAX) 0.5 MG tablet TAKE ONE-HALF TO ONE TABLET BY MOUTH THREE TIMES DAILY AS NEEDED  90 tablet  5  . Alum & Mag Hydroxide-Simeth (MAGIC MOUTHWASH) SOLN 1 tsp gargle and swallow four times daily as needed for sore throat  120 mL  1  . atenolol (TENORMIN) 50 MG tablet TAKE ONE TABLET BY MOUTH EVERY DAY  30 tablet  6  . BONIVA 150 MG tablet TAKE 1 TABLET BY MOUTH ONCE A MONTH  1 each  11  . budesonide (PULMICORT) 0.25 MG/2ML nebulizer solution Take by nebulization 2 (two) times daily.        . calcium-vitamin D (OSCAL WITH D) 500-200 MG-UNIT per tablet Take 1 tablet by mouth 2 (two) times daily.        . Cholecalciferol (VITAMIN D3) 1000 UNITS CAPS Take by mouth daily.        Marland Kitchen dextromethorphan-guaiFENesin (MUCINEX DM) 30-600 MG per 12 hr tablet Take 2 tablets by mouth every 12 (twelve) hours.        Marland Kitchen etodolac (LODINE) 400 MG tablet TAKE ONE TABLET BY MOUTH EVERY 12 HOURS  60 tablet  3  . ferrous sulfate 325 (65 FE) MG tablet Take by mouth daily with breakfast.        . fluticasone (FLONASE) 50 MCG/ACT nasal spray USE TWO SPRAY EACH NOSTRIL TWICE DAILY  16 g  5  . furosemide (LASIX) 40 MG tablet TAKE ONE TABLET BY MOUTH EVERY DAY  30 tablet  5  . gabapentin (NEURONTIN) 100 MG capsule TAKE THREE CAPSULES BY MOUTH AT BEDTIME  100 capsule  4  . insulin NPH-insulin regular (NOVOLIN 70/30) (70-30) 100 UNIT/ML injection Inject 24  units in the morning and 24  units in the evening      . ipratropium-albuterol (DUONEB) 0.5-2.5 (3) MG/3ML SOLN Take by nebulization 4  (four) times daily.        Marland Kitchen losartan (COZAAR) 50 MG tablet Take 1 tablet (50 mg total) by mouth daily.  30 tablet  11  . metFORMIN (GLUCOPHAGE) 500 MG tablet TAKE ONE TABLET BY MOUTH TWICE DAILY  60 tablet  6  . nitroGLYCERIN (NITROSTAT) 0.4 MG SL tablet Place 1 tablet (0.4 mg total) under the tongue every 5 (five) minutes as needed. May repeat x3  25 tablet  6  . Omega-3 Fatty Acids (FISH OIL MAXIMUM STRENGTH) 1200 MG CAPS Take by mouth daily.        Marland Kitchen omeprazole (PRILOSEC) 20 MG capsule TAKE ONE CAPSULE BY MOUTH EVERY DAY 30 MINUTES BEFORE A MEAL  30 capsule  11  . simvastatin (ZOCOR) 80 MG tablet Take 1 tablet (80 mg total) by mouth at bedtime.  30 tablet  11  . traMADol (ULTRAM) 50 MG tablet TAKE ONE TABLET BY MOUTH  EVERY 8 HOURS AS NEEDED FOR PAIN  90 tablet  4  . vitamin B-12 (CYANOCOBALAMIN) 500 MCG tablet Take 1,000 mcg by mouth daily.       Marland Kitchen DISCONTD: insulin NPH-insulin regular (HUMULIN 70/30) (70-30) 100 UNIT/ML injection Inject 25 units in the morning and 25 units in the evening  20 mL  6     Allergies  Allergen Reactions  . Codeine     vomiting  . Terbutaline Sulfate     REACTION: INTOL to terbutaline    Current Medications, Allergies, Past Medical History, Past Surgical History, Family History, and Social History were reviewed in [employer] record.    Review of Systems         See HPI - all other systems neg except as noted...   The patient complains of dyspnea on exertion, peripheral edema, muscle weakness, and difficulty walking.  The patient denies anorexia, fever, weight loss, weight gain, vision loss, decreased hearing, hoarseness, chest pain, syncope, prolonged cough, headaches, hemoptysis, abdominal pain, melena, hematochezia, severe indigestion/heartburn, hematuria, incontinence, suspicious skin lesions, transient blindness, depression, unusual weight change, abnormal bleeding, enlarged lymph nodes, and angioedema.    Objective:   Physical  Exam    WD, Obese, 71 y/o WF in NAD... GENERAL:  Alert & oriented; pleasant & cooperative...  HEENT:  [place]/AT, EOM-wnl, PERRLA, EACs-clear, TMs-wnl, NOSE-clear, THROAT-clear & wnl. NECK:  Supple w/ fairROM; no JVD; normal carotid impulses w/o bruits; no thyromegaly or nodules palpated; no lymphadenopathy. CHEST:  Decr BS bilat, few scat rhonchi, without wheezing, rales, or rubs...upper airway psuedo-wheezing HEART:  Regular Rhythm;  gr 1/6 SEM without rubs or gallops detected... ABDOMEN:  Obese, soft & nontender; normal bowel sounds; no organomegaly or masses detected. EXT: without deformities, mild arthritic changes; no varicose veins/ +venous insuffic/ tr edema. Sl tender on palpation over lumbar area & siatic notch... NEURO:  CN's intact; no focal neuro deficits... DERM:  no lesion seen...  RADIOLOGY DATA:  Reviewed in the EPIC EMR & discussed w/ the patient...  LABORATORY DATA:  Reviewed in the EPIC EMR & discussed w/ the patient...   Assessment & Plan:   COPD>  Severe, continue home Oxygen, Nebulizer, Mucinex, & Fluids;  We will Rx w/ ZPak & reviewed her max regimen; She will also continue vigorous nasal regimen as discussed...  HBP>  Fair control on Atenolol50, Losartan50, Furosemide40; discussed diet, must get wt down, no sodium...  CAD>  Followed by DrMcDowell in [place] office, no angina, stable clinically, continue medical management...  HYPERLIPID>  On Simva80 + Fish Oil; needs better diet & get wt down!  DM>  Control remains poor as she has been unable to diet effectively, lose weight, or incrementally increase her insulin properly to get sugars under control;  Again asked to slowly inc her 70/30 doses by 1-2 u increments until she is taking 30uBid.  OBESITY>  Weight reduction is key to improvement in all areas...  GI> GERD, Ulcers, +HPylori, Constip>  Improved after HPylori Rx & back on Omep 20mg /d...  DJD, LBP, etc>  Lumbar XRay reviewed, trial Tramadol, Robaxin,  rest/ heat/ etc... Refer to Ortho for further eval & Rx...  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  Anxiety>  Alprazolam for prn use- refilled...  Anemia>  On Fe daily and Hg is improved (s/p hosp w/ GIB)...

## 2011-06-07 NOTE — Patient Instructions (Signed)
Today we updated your med list in our EPIC system...    Continue your current medications the same...  We will ask our [facility] to arrange for a new DME company to supply your Oxygen, Nebulizer, & your 3 NEB Meds...    Call us if there is any problem w/ this transition...  Today we did your follow up diabetic labs...    We will call you w/ the results when available...  Stay as active as possible!  Let's plan another follow up visit in  4months.

## 2011-06-14 NOTE — Progress Notes (Signed)
Subjective:    Patient ID: [name], female    DOB: Jul 10, 1940, 71 y.o.   MRN: [id number]  HPI70 y/o WF here for a follow up visit... she has mult med problems including severe COPD; HBP; CAD w/prev MI & PTCA followed by DrMcDowell; VI; Hyperlipidemia; DM on insulin; Obesity; GERD/ constipation; DJD/ osteoporosis; Anxiety...  ~  October 24, 2009:  57mo ROV- BS are all over the place (up to 400, down to 100) & we discussed adjusting her 70/30 insulin accordingly (her weight is down to 217#)... under alot of stress w/ 37 y/o mother "driving me nuts"... she saw DrMcDowell 2/11- BP sl elev & Lisinopril incr to 20mg /d... currently on 70/30 insulin taking 25u AM & 20uPM> rec incr doses slowly & titrate to BS values (discussed w/ pt, again).  ~  April 24, 2010:  57mo ROV- she has only incr insulin marginally to 25-30u AM & 20-25u PM and todays lab shows FBS=236, A1c=8.8; **we discussed incr her 70/30 insulin to 35u AM & 30u PM & titrate up from there...     BP is controlled on Aten, Lisin, Lasix> 140/82 today & she needs to check more often at home;  she saw DrMcDowell 9/11> HBP/ CAD/ Lipids all stable & no changes made;  she has lost wt down to 215# from peak 258# in 2010 but no wt loss over the last 57mo & we reviewed diet + exercise perscription;  FLP looks good on Simva80 + Fish Oil & meds tol well so we won't make any changes in her regimen...    She is c/o cough, yellow sput, nasal congestion & sneezing, feeling weak & "woozy" w/ mult somatic complaints;  she saw TP 2/6 & given Avelox, Mucinex, Hydromet but has persistant symptoms; **we discussed sinus regimen w/ Saline nasal mist, Mucinex + Fluids, Flonase, plus a brief PredDosepak (next step= ENT referral)...    She also has right knee pain> known degen arthritis & followed by DrNorris for Ortho- given steroid shot 9/11 w/ some transient improvement;  she uses etodolac Prn & has Omep for stomach;  finally asking for refill alprazolam due to  anxiety...  ~  October 04, 2010:  57mo ROV & post hosp visit> she was [facility] 3/15 - 05/26/10 by [facility] w/ COPD exac, GIB from HPylori pos gastritis & ulcers, acute blood loss anemia, chr renal insuffic, etc;  She improved w/ COPD rx, Pred, ACE ch to ARB, Mucinex, etc;  EGD by DrJacobs w/ Ab rx for HPylori & DC NSAIDs, Boniva, etc;  Anemia gradually improved w/ oral Fe therapy;  Renal is stable w/ Creat 1.2-1.4...    She has mult somatic complaints & doesn't feel well> c/o LBP some radiation into legs; she is off prev ASA & Lodine due to GIB, just using Tylenol & topical rubs for pain;  We discussed trying rest, heat, Tramadol w/ Tylenol & refer to Ortho for further eval...    BP control is fair off the ACE now & she didn't bring med bottles to review compliance; she remains on Simva80 + Fish Oil for her Lipids; she has adjusted her insulin up to 30uAM & 25uPM of her Humulin70/30 & she continues on the Metformin; she has not lost any weight; I indicated to her that if we are to help her control her DM & mult medical problems then she will need to see [place] every 3months but she is reluctant due to the number of specialists that she sees...  ~  February 06, 2011:  85mo ROV & she is c/o some head & chest congestion, cough w/ thick brown sputum, sl moe SOB, denies f/c/s, denies CP, etc;  We reviewed meds> NEBS regularly, Mucinex, Fluids, & she wants ZPak for infection- ok...  She requests refills, declines Flu shot...  Labs today w/ Chol ok but TG 221 & needs better diet; BS 237, A1c 8.0 is actually sl better on Novolin 70/30 taking 25uBid + Metformin 500Bid (we reviewed recs for slowly increasing dose)...    She saw DrMcDowell for Cards f/u 10/12> stable w/ longstanding CAD, prev PTCA, not having angina, mobility limited, & he rec continued med rx & observation...    She saw DrAplington 8/12 w/ LBP spondylosis & spinal stenosis> given PT, poor operative risk, wt loss will help, trial TENS, continue meds and Neurontin...  ~   June 07, 2011:  85mo ROV   COPD>  HBP>  CAD>  Hyperlipid>  DM>  Obesity>  GI> GERD, Constip>  DJD>  Osteoporosis>  Anxiety>           Problem List:  MAXILLARY SINUSITIS (ICD-461.0)  - hx sinusitis symptoms 3/09 w/ CTSinus neg for acute changes... Rx'd w/ MUCINEX, Nasal Saline, FLONASE...  2/11>> c/o sinusitis & AUGMENTIN perscribed.  COPD (ICD-496) - she has COPD and is an ex-smoker... controlled on NEBULIZER w/ Duoneb Qid + Budesonide Bid, MUCINEX, etc... she has end stage disease w/ severe dyspnea w/ ADLs and no change over the last yr... ~  baseline CXR = COPD, incr heart size, NAD. ~ CT Chest 2/02 showed COPD, Atx, sl scarring, NAD. ~  bronchoscopy 3/02 w/ mucous plugging, no endobr lesions. ~  PFT's in 12/07 showed FVC=0.81 (27%), FEV1=0.54 (22%), %1sec=67, mid-flows=13%pred. ~  CXR 1/10 showed chr changes, cardiomeg, lingular atx. ~  CXR 2/11 showed cardiomeg, bibasilar scarring, NAD. ~  CXR 2/12 showed cardiomeg, COPD,scarring left base, NAD.  HYPERTENSION (ICD-401.9) - controlled on ATENOLOL 50mg /d, LOSARTAN 50mg /d, LASIX 40mg /d... BP=146/78 and tol well... denies HA, visual changes, CP, palipit, dizziness, syncope, change in dyspnea, edema, etc. ~  Jan10: tried to change ACE to Diovan but too $$$ & requested change back to Lisinopril==> eventually changed to generic LOSARTAN...  CORONARY ARTERY DISEASE (ICD-414.00) - on ASA 81mg /d... hx of CAD w/ prev MI in 2000, and subseq PTCA's in 2000, & 2001... ~  last cath 2001 w/ PTCA to mid-LAD lesion w/ cutting balloon & brachytherapy for in-stent restenosis. ~  last hosp 6/08 w/ CP- felt to be non-cardiac... ~  last 2DECho 6/08 showed HK of inferoseptal area, EF=55%... ~  last NuclearStressTest 7/08 w/ mod ant/apical/septal infarct, apical HK, no ischemia, EF=?53%... ~  followed by DrMcDowell at [facility] office- notes reviewed...  VENOUS INSUFFICIENCY (ICD-459.81) - low sodium diet, elevation, & Lasix 40-80mg /d... states she  "allergic" to TED hose! and refuses to wear them...   HYPERLIPIDEMIA (ICD-272.4) - on ZOCOR 80mg /d + FISH OIL 1000mg /d ... she has been counselled on diet & exercise but unsuccessful... ~  FLP 11/08 on Simva40 showed TChol 150, TG 185, HDL 35, LDL 78 ~  FLP 3/09 on Simva40 showed TChol 139, TG 193, HDL 30, LDL 70... DrMcDowell incr to Simva80 9/09. ~  FLP 2/10 on Simva80 showed TChol 155, TG 137, HDL 43, LDL 85 ~  FLP 10/10 showed TChol 129, TG 96, HDL 39, LDL 71 ~  FLP 8/11 showed TChol 130, TG 152, HDL 30, LDL 70 ~  FLP 2/12 showed TChol 139, TG 96, HDL  40, LDL 80 ~  FLP 12/12 on Simva80 showed TChol 154, TG 221, HDL 39, LDL 81... LFTs remain wnl; rec> better low fat diet...  DM (ICD-250.00) - on HUMULIN 70/30- now taking 25uAM & 25uPM she says & GLUCOPHAGE 500mg Bid (off prev Avandia)... ~  BS=142 & HgA1c=6.6 in Nov08... ~  Labs 3/09 showed BS= 112, HgA1c= 6.8 ~  labs 7/09 showed BS= 197, HgA1c= 7.1.Marland KitchenMarland Kitchen rec- better diet! ~  labs 2/10 showed BS= 134, A1c= 7.0 ~  labs 10/10 showed BS= 151, A1c= 6.9 ~  labs 2/11 showed BS= 239, A1c= 8.1.Marland KitchenMarland Kitchen pt instructed to up titrate insulin til BS= 120-150 range. ~  labs 8/11 showed BS= 215, A1c= 8.4... again instructed to incr insulin slowly & titrate to BS values. ~  labs 2/12 showed BS= 236, A1c= 8.8.Marland KitchenMarland Kitchen rec to incr 70/30 insulin incrementally... ~  Labs 8/12 showed BS= 199, A1c= 9.2.Marland KitchenMarland Kitchen rec slowly incr her 70/30 insulin doses as discussed. ~  Labs 12/12 on 70/30 Insulin 25uBid + Metform500Bid showed BS= 237, A1c=8.0.Marland KitchenMarland Kitchen We discussed incr to 30uAM & 30uPM...  OBESITY (ICD-278.00) - we discussed diet + exercise program required to lose weight! ~  weight 2008 = 235# ~  weight 2/10 = 250# ~  weight 10/10 = 258# ~  weight 2/11 = 241# ~  weight 8/11 = 217# ~  weight 2/12 = 215# ~  Weight 8/12 = 214# ~  Weight 12/12 = 209#  GERD (ICD-530.81) - on PPI therapy (changed 2/10 per request to OMEPRAZOLE20mg /d)... ~  [facility] 3/12 for COPD exac & was anemic w/ heme  pos stool & GIB; EGD by DrJacobs w/ gastritis & prepyloric ulcers, +HPylori- treated & improved.  CONSTIPATION (ICD-564.00) - last colonoscopy was 11/07 by DrJacobs and negative...  DEGENERATIVE JOINT DISEASE (ICD-715.90) - prev CSpine films w/ spondy and biforaminal narrowing... LSSpine films w/ DDD and osteopenia...  ~  she saw DrDrake, Ortho in [place], for shot in right foot. ~  9/11:  she saw DrNorris for shot in right knee. ~  8/12: presents c/o LBP, leg pain; XRay Lumbar spine w/ DDD, narrowing, osteophytes, facet dis, etc; Rx w/ rest, heat, TRAMADOL, ROBAXIN, refer to Ortho. ~  She has followed up w/ DrApplington> not a surg candidate for her lumbar spondylosis & sp stenosis...  OSTEOPOROSIS (ICD-733.00) - on BONIVA 150mg /mo, Calcium, Vits... she needs a BMD... ~  labs 2/11 showed Vit D level = 27... OK to change to 1000 u daily. ~  labs 2/12 showed Vit D level = 37... rec to continue 1000u supplement  ANXIETY (ICD-300.00) - uses Alprazolam 0.5mg  as needed...  ANEMIA, MILD (ICD-285.9) ~  labs 2/10 showed Hg= 11.9, Fe= 51... ~  labs 10/10 showed Hg= 11.9, Fe= 50... rec OTC Fe supplement. ~  labs 2/11 showed Hg= 13.4, Fe= 52 (14%sat) ~  labs 2/12 showed Hg= 13.1, MCV= 93 ~  Labs 8/12 showed Hg= 12.0, Fe= 61 (15%sat); rec to continue oral iron...  Health Maintenance - she hasn't seen GYN in yrs and is asked to re-establish... prev refused Mammograms due to the discomfort & bruising- she will discuss w/ Gyn & get a follow up BMD as well...   Past Surgical History  Procedure Date  . Total abdominal hysterectomy   . Cholecystectomy     Outpatient Encounter Prescriptions as of 06/07/2011  Medication Sig Dispense Refill  . acetaminophen (TYLENOL) 500 MG tablet Take 500 mg by mouth. As needed       . albuterol (PROVENTIL) (2.5  MG/3ML) 0.083% nebulizer solution Take 3 mLs (2.5 mg total) by nebulization 4 (four) times daily.  75 mL  11  . ALPRAZolam (XANAX) 0.5 MG tablet TAKE ONE-HALF TO  ONE TABLET BY MOUTH THREE TIMES DAILY AS NEEDED  90 tablet  5  . Alum & Mag Hydroxide-Simeth (MAGIC MOUTHWASH) SOLN 1 tsp gargle and swallow four times daily as needed for sore throat  120 mL  1  . atenolol (TENORMIN) 50 MG tablet TAKE ONE TABLET BY MOUTH EVERY DAY  30 tablet  6  . BONIVA 150 MG tablet TAKE 1 TABLET BY MOUTH ONCE A MONTH  1 each  11  . budesonide (PULMICORT) 0.25 MG/2ML nebulizer solution Take 2 mLs (0.25 mg total) by nebulization 2 (two) times daily.  60 mL  11  . calcium-vitamin D (OSCAL WITH D) 500-200 MG-UNIT per tablet Take 1 tablet by mouth 2 (two) times daily.        . Cholecalciferol (VITAMIN D3) 1000 UNITS CAPS Take by mouth daily.        Marland Kitchen dextromethorphan-guaiFENesin (MUCINEX DM) 30-600 MG per 12 hr tablet Take 2 tablets by mouth every 12 (twelve) hours.        Marland Kitchen etodolac (LODINE) 400 MG tablet TAKE ONE TABLET BY MOUTH EVERY 12 HOURS  60 tablet  3  . ferrous sulfate 325 (65 FE) MG tablet Take by mouth daily with breakfast.        . fluticasone (FLONASE) 50 MCG/ACT nasal spray USE TWO SPRAY EACH NOSTRIL TWICE DAILY  16 g  5  . furosemide (LASIX) 40 MG tablet TAKE ONE TABLET BY MOUTH EVERY DAY  30 tablet  5  . gabapentin (NEURONTIN) 100 MG capsule TAKE THREE CAPSULES BY MOUTH AT BEDTIME  100 capsule  4  . insulin NPH-insulin regular (NOVOLIN 70/30) (70-30) 100 UNIT/ML injection Inject 24  units in the morning and 24  units in the evening      . losartan (COZAAR) 50 MG tablet Take 1 tablet (50 mg total) by mouth daily.  30 tablet  11  . metFORMIN (GLUCOPHAGE) 500 MG tablet TAKE ONE TABLET BY MOUTH TWICE DAILY  60 tablet  6  . nitroGLYCERIN (NITROSTAT) 0.4 MG SL tablet Place 1 tablet (0.4 mg total) under the tongue every 5 (five) minutes as needed. May repeat x3  25 tablet  6  . Omega-3 Fatty Acids (FISH OIL MAXIMUM STRENGTH) 1200 MG CAPS Take by mouth daily.        Marland Kitchen omeprazole (PRILOSEC) 20 MG capsule TAKE ONE CAPSULE BY MOUTH EVERY DAY 30 MINUTES BEFORE A MEAL  30 capsule   11  . simvastatin (ZOCOR) 80 MG tablet Take 1 tablet (80 mg total) by mouth at bedtime.  30 tablet  11  . traMADol (ULTRAM) 50 MG tablet TAKE ONE TABLET BY MOUTH EVERY 8 HOURS AS NEEDED FOR PAIN  90 tablet  4  . vitamin B-12 (CYANOCOBALAMIN) 500 MCG tablet Take 1,000 mcg by mouth daily.       Marland Kitchen DISCONTD: albuterol (PROVENTIL) (2.5 MG/3ML) 0.083% nebulizer solution Take 2.5 mg by nebulization 4 (four) times daily.        Marland Kitchen DISCONTD: budesonide (PULMICORT) 0.25 MG/2ML nebulizer solution Take by nebulization 2 (two) times daily.        Marland Kitchen DISCONTD: insulin NPH-insulin regular (HUMULIN 70/30) (70-30) 100 UNIT/ML injection Inject 25 units in the morning and 25 units in the evening  20 mL  6  . DISCONTD: ipratropium-albuterol (DUONEB) 0.5-2.5 (3)  MG/3ML SOLN Take by nebulization 4 (four) times daily.        Marland Kitchen ipratropium (ATROVENT) 0.02 % nebulizer solution Take 2.5 mLs (500 mcg total) by nebulization 4 (four) times daily.  75 mL  11     Allergies  Allergen Reactions  . Codeine     vomiting  . Terbutaline Sulfate     REACTION: INTOL to terbutaline    Current Medications, Allergies, Past Medical History, Past Surgical History, Family History, and Social History were reviewed in [employer] record.    Review of Systems        See HPI - all other systems neg except as noted...   The patient complains of dyspnea on exertion, peripheral edema, muscle weakness, and difficulty walking.  The patient denies anorexia, fever, weight loss, weight gain, vision loss, decreased hearing, hoarseness, chest pain, syncope, prolonged cough, headaches, hemoptysis, abdominal pain, melena, hematochezia, severe indigestion/heartburn, hematuria, incontinence, suspicious skin lesions, transient blindness, depression, unusual weight change, abnormal bleeding, enlarged lymph nodes, and angioedema.    Objective:   Physical Exam   WD, Obese, 71 y/o WF in NAD... GENERAL:  Alert & oriented; pleasant &  cooperative...  HEENT:  [place]/AT, EOM-wnl, PERRLA, EACs-clear, TMs-wnl, NOSE-clear, THROAT-clear & wnl. NECK:  Supple w/ fairROM; no JVD; normal carotid impulses w/o bruits; no thyromegaly or nodules palpated; no lymphadenopathy. CHEST:  Decr BS bilat, few scat rhonchi, without wheezing, rales, or rubs...upper airway psuedo-wheezing HEART:  Regular Rhythm;  gr 1/6 SEM without rubs or gallops detected... ABDOMEN:  Obese, soft & nontender; normal bowel sounds; no organomegaly or masses detected. EXT: without deformities, mild arthritic changes; no varicose veins/ +venous insuffic/ tr edema. Sl tender on palpation over lumbar area & siatic notch... NEURO:  CN's intact; no focal neuro deficits... DERM:  no lesion seen...  RADIOLOGY DATA:  Reviewed in the EPIC EMR & discussed w/ the patient...  LABORATORY DATA:  Reviewed in the EPIC EMR & discussed w/ the patient...   Assessment & Plan:   COPD>  Severe, continue home Oxygen, Nebulizer, Mucinex, & Fluids;  We will Rx w/ ZPak & reviewed her max regimen; She will also continue vigorous nasal regimen as discussed...  HBP>  Fair control on Atenolol50, Losartan50, Furosemide40; discussed diet, must get wt down, no sodium...  CAD>  Followed by DrMcDowell in [place] office, no angina, stable clinically, continue medical management...  HYPERLIPID>  On Simva80 + Fish Oil; needs better diet & get wt down!  DM>  Control remains poor as she has been unable to diet effectively, lose weight, or incrementally increase her insulin properly to get sugars under control;  Again asked to slowly inc her 70/30 doses by 1-2 u increments until she is taking 30uBid.  OBESITY>  Weight reduction is key to improvement in all areas...  GI> GERD, Ulcers, +HPylori, Constip>  Improved after HPylori Rx & back on Omep 20mg /d...  DJD, LBP, etc>  Lumbar XRay reviewed, trial Tramadol, Robaxin, rest/ heat/ etc... Refer to Ortho for further eval & Rx...  Osteoprosis>  On  Boniva, Calcium, MVI, Vit D;  Needs f/u BMD to assess status...  Anxiety>  Alprazolam for prn use- refilled...  Anemia>  On Fe daily and Hg is improved (s/p hosp w/ GIB)...

## 2011-06-20 ENCOUNTER — Encounter: Payer: Self-pay | Admitting: Cardiology

## 2011-06-20 ENCOUNTER — Ambulatory Visit (INDEPENDENT_AMBULATORY_CARE_PROVIDER_SITE_OTHER): Payer: Medicare Other | Admitting: Cardiology

## 2011-06-20 VITALS — BP 136/72 | HR 67 | Resp 16 | Ht 64.0 in | Wt 202.0 lb

## 2011-06-20 DIAGNOSIS — I10 Essential (primary) hypertension: Secondary | ICD-10-CM

## 2011-06-20 DIAGNOSIS — E785 Hyperlipidemia, unspecified: Secondary | ICD-10-CM

## 2011-06-20 DIAGNOSIS — I2510 Atherosclerotic heart disease of native coronary artery without angina pectoris: Secondary | ICD-10-CM

## 2011-06-20 NOTE — Patient Instructions (Signed)
**Note De-Identified Regginald Pask Obfuscation** Your physician recommends that you return for lab work in: 6 months just before next office visit.  Your physician recommends that you schedule a follow-up appointment in: 6 months  Your physician recommends that you continue on your current medications as directed. Please refer to the Current Medication list given to you today.

## 2011-06-20 NOTE — Assessment & Plan Note (Signed)
Blood pressure regimen stable. Patient working on weight loss also. No changes made today.

## 2011-06-20 NOTE — Assessment & Plan Note (Signed)
Recheck FLP and LFT for next visit. Continue present medications.

## 2011-06-20 NOTE — Assessment & Plan Note (Signed)
Symptomatically stable on medical therapy. Continue observation for now. Followup arranged.

## 2011-06-20 NOTE — Progress Notes (Signed)
Clinical Summary [name] is a 71 y.o.female presenting for followup. She was seen in October 2012. She saw Dr. Kriste Basque recently for her routine visit.  Recent lab work reviewed finding potassium 4.3, BUN 19, creatinine 1.0, hemoglobin 11.6, hematocrit 35.3, platelets 216, hemoglobin A1c 7.9 down from 8.0.  She denies any progressive angina, no recent NTG use. Reports compliance with her medications and supplemental oxygen. No regular exercise beyond walking to mailbox and with shopping. She has been trying to lose weight - down to 202 now.   Allergies  Allergen Reactions  . Codeine     vomiting  . Terbutaline Sulfate     REACTION: INTOL to terbutaline    Current Outpatient Prescriptions  Medication Sig Dispense Refill  . acetaminophen (TYLENOL) 500 MG tablet Take 500 mg by mouth. As needed       . albuterol (PROVENTIL) (2.5 MG/3ML) 0.083% nebulizer solution Take 3 mLs (2.5 mg total) by nebulization 4 (four) times daily.  75 mL  11  . ALPRAZolam (XANAX) 0.5 MG tablet TAKE ONE-HALF TO ONE TABLET BY MOUTH THREE TIMES DAILY AS NEEDED  90 tablet  5  . Alum & Mag Hydroxide-Simeth (MAGIC MOUTHWASH) SOLN 1 tsp gargle and swallow four times daily as needed for sore throat  120 mL  1  . atenolol (TENORMIN) 50 MG tablet TAKE ONE TABLET BY MOUTH EVERY DAY  30 tablet  6  . BONIVA 150 MG tablet TAKE 1 TABLET BY MOUTH ONCE A MONTH  1 each  11  . budesonide (PULMICORT) 0.25 MG/2ML nebulizer solution Take 2 mLs (0.25 mg total) by nebulization 2 (two) times daily.  60 mL  11  . calcium-vitamin D (OSCAL WITH D) 500-200 MG-UNIT per tablet Take 1 tablet by mouth 2 (two) times daily.        . Cholecalciferol (VITAMIN D3) 1000 UNITS CAPS Take by mouth daily.        Marland Kitchen dextromethorphan-guaiFENesin (MUCINEX DM) 30-600 MG per 12 hr tablet Take 2 tablets by mouth every 12 (twelve) hours.        Marland Kitchen etodolac (LODINE) 400 MG tablet TAKE ONE TABLET BY MOUTH EVERY 12 HOURS  60 tablet  3  . ferrous sulfate 325 (65 FE)  MG tablet Take by mouth daily with breakfast.        . fluticasone (FLONASE) 50 MCG/ACT nasal spray USE TWO SPRAY EACH NOSTRIL TWICE DAILY  16 g  5  . furosemide (LASIX) 40 MG tablet TAKE ONE TABLET BY MOUTH EVERY DAY  30 tablet  5  . gabapentin (NEURONTIN) 100 MG capsule TAKE THREE CAPSULES BY MOUTH AT BEDTIME  100 capsule  4  . insulin NPH-insulin regular (NOVOLIN 70/30) (70-30) 100 UNIT/ML injection Inject 24  units in the morning and 24  units in the evening      . ipratropium (ATROVENT) 0.02 % nebulizer solution Take 2.5 mLs (500 mcg total) by nebulization 4 (four) times daily.  75 mL  11  . losartan (COZAAR) 50 MG tablet Take 1 tablet (50 mg total) by mouth daily.  30 tablet  11  . metFORMIN (GLUCOPHAGE) 500 MG tablet TAKE ONE TABLET BY MOUTH TWICE DAILY  60 tablet  6  . nitroGLYCERIN (NITROSTAT) 0.4 MG SL tablet Place 1 tablet (0.4 mg total) under the tongue every 5 (five) minutes as needed. May repeat x3  25 tablet  6  . omeprazole (PRILOSEC) 20 MG capsule TAKE ONE CAPSULE BY MOUTH EVERY DAY 30 MINUTES BEFORE A  MEAL  30 capsule  11  . simvastatin (ZOCOR) 80 MG tablet Take 1 tablet (80 mg total) by mouth at bedtime.  30 tablet  11  . traMADol (ULTRAM) 50 MG tablet TAKE ONE TABLET BY MOUTH EVERY 8 HOURS AS NEEDED FOR PAIN  90 tablet  4  . vitamin B-12 (CYANOCOBALAMIN) 500 MCG tablet Take 1,000 mcg by mouth daily.         Past Medical History  Diagnosis Date  . COPD (chronic obstructive pulmonary disease)   . Hyperlipidemia   . Essential hypertension, benign   . Anemia   . Anxiety   . Arthritis   . Coronary atherosclerosis of native coronary artery     PTCA/BMS LAD 2000, PTCA/brachytherapy LAD 2001, LVEF 55%  . Type 2 diabetes mellitus   . Osteoporosis   . Venous insufficiency   . Obesity   . Myocardial infarction     AMI 2000    Social History [name] reports that she quit smoking about 13 years ago. Her smoking use included Cigarettes. She has a 30 pack-year smoking  history. She does not have any smokeless tobacco history on file. [name] reports that she does not drink alcohol.  Review of Systems No palpitations, falls. No cough, fevers, chills. Stable DOE. No orthopnea or PND. No edema. Arthritic pain. Otherwise negative.  Physical Examination Filed Vitals:   06/20/11 1042  BP: 136/72  Pulse: 67  Resp: 16   Chronically ill-appearing woman in no acute distress, wearing oxygen.  HEENT: Conjunctiva and lids normal, oropharynx with moist mucosa.  Neck: Supple, no elevated JVP or bruits.  Lungs: Diminished breath sounds throughout, Nonlabored.  Cardiac: Regular rate and rhythm, no S3 gallop or rub.  Abdomen: Soft, nontender, bowel sounds present.  Skin: Warm and dry.  Extremities: No pitting edema.     Problem List and Plan

## 2011-06-22 ENCOUNTER — Other Ambulatory Visit: Payer: Self-pay | Admitting: Adult Health

## 2011-06-26 ENCOUNTER — Ambulatory Visit (INDEPENDENT_AMBULATORY_CARE_PROVIDER_SITE_OTHER)
Admission: RE | Admit: 2011-06-26 | Discharge: 2011-06-26 | Disposition: A | Discharge status: Home / Self Care | Payer: Medicare Other | Source: Ambulatory Visit

## 2011-06-26 DIAGNOSIS — M810 Age-related osteoporosis without current pathological fracture: Secondary | ICD-10-CM

## 2011-07-10 ENCOUNTER — Telehealth: Payer: Self-pay | Admitting: Pulmonary Disease

## 2011-07-10 NOTE — Telephone Encounter (Signed)
Called and spoke with pt and she stated that she received a letter from [place] stating that they had tried to contact us to get [place] to sign a form for the pt to get her oxygen recertification---i called and spoke with [employer]---and they have all the forms that they needed for recertification.  Nothing further was needed per [employer]---i called and made the pt aware.

## 2011-07-31 ENCOUNTER — Other Ambulatory Visit: Payer: Self-pay | Admitting: Pulmonary Disease

## 2011-07-31 ENCOUNTER — Telehealth: Payer: Self-pay | Admitting: Pulmonary Disease

## 2011-07-31 MED ORDER — METHYLPREDNISOLONE 4 MG PO KIT: PACK | ORAL | Status: AC

## 2011-07-31 MED ORDER — AMOXICILLIN-POT CLAVULANATE 875-125 MG PO TABS: 1.0000 | ORAL_TABLET | Freq: Two times a day (BID) | ORAL | Status: AC

## 2011-07-31 NOTE — Telephone Encounter (Signed)
Called spoke with patient who c/o prod cough with yellow mucus, increased SOB, tightness in chest x1 day.  Denies f/c/s.  Requesting recs.   Dr Kriste Basque please advise, thanks. Last ov 4.4.13, next 8.9.13. [employer]. Allergies  Allergen Reactions  . Codeine     vomiting  . Terbutaline Sulfate     REACTION: INTOL to terbutaline

## 2011-07-31 NOTE — Telephone Encounter (Signed)
Spoke with pt and notified of recs per SN. She verbalized understanding and denied any questions. Rx were sent  to pharm.

## 2011-07-31 NOTE — Telephone Encounter (Signed)
Per SN----increase the mucinex 2 po bid with plenty of fluids, continue nebs qid, and call in augmentin 875mg   #14  1 po bid and medrol dosepak 4 mg  6 day pack.  thanks

## 2011-08-16 ENCOUNTER — Telehealth: Payer: Self-pay | Admitting: Pulmonary Disease

## 2011-08-16 MED ORDER — CLARITHROMYCIN 500 MG PO TABS: 500.0000 mg | ORAL_TABLET | Freq: Two times a day (BID) | ORAL | Status: AC

## 2011-08-16 NOTE — Telephone Encounter (Signed)
Per CY---ok to give pt biaxin 500mg    #20   1 po bid after a meal.  Called and spoke with pt and she is aware of rx called to the pharmacy and nothing further needed.

## 2011-08-16 NOTE — Telephone Encounter (Signed)
Pt finished Augmentin and Medrol dosepak. She says she did improve slightly but is now getting worse again. She has continued with Mucinex and her nebulizer treatments as instructed. Cough is prod with brownish sputum, occ wheezing and sob. She denies any fever, sore throat or chest tightness,. SN, pls advise. Allergies  Allergen Reactions  . Codeine     vomiting  . Terbutaline Sulfate     REACTION: INTOL to terbutaline

## 2011-08-16 NOTE — Telephone Encounter (Signed)
Called and spoke with pt and she stated that she never got over being sick before after taking the augmentin---she is now having the chest congestion and cough with brown sputum.  Pt is requesting something be called in to the pharmacy for her.  CY please advise. Thanks  Allergies  Allergen Reactions  . Codeine     vomiting  . Terbutaline Sulfate     REACTION: INTOL to terbutaline

## 2011-08-21 ENCOUNTER — Other Ambulatory Visit: Payer: Self-pay | Admitting: Pulmonary Disease

## 2011-08-29 ENCOUNTER — Telehealth: Payer: Self-pay | Admitting: Pulmonary Disease

## 2011-08-29 ENCOUNTER — Other Ambulatory Visit: Payer: Self-pay | Admitting: Pulmonary Disease

## 2011-08-29 NOTE — Telephone Encounter (Signed)
Forward to Dr. Kriste Basque for review on 08-29-11 ym

## 2011-10-01 ENCOUNTER — Other Ambulatory Visit: Payer: Self-pay | Admitting: Pulmonary Disease

## 2011-10-12 ENCOUNTER — Ambulatory Visit (INDEPENDENT_AMBULATORY_CARE_PROVIDER_SITE_OTHER): Payer: Medicare Other | Admitting: Pulmonary Disease

## 2011-10-12 ENCOUNTER — Encounter: Payer: Self-pay | Admitting: Pulmonary Disease

## 2011-10-12 ENCOUNTER — Other Ambulatory Visit (INDEPENDENT_AMBULATORY_CARE_PROVIDER_SITE_OTHER): Payer: Medicare Other

## 2011-10-12 VITALS — BP 118/70 | HR 74 | Temp 97.4°F | Ht 64.5 in | Wt 205.8 lb

## 2011-10-12 DIAGNOSIS — J4489 Other specified chronic obstructive pulmonary disease: Secondary | ICD-10-CM

## 2011-10-12 DIAGNOSIS — E785 Hyperlipidemia, unspecified: Secondary | ICD-10-CM

## 2011-10-12 DIAGNOSIS — R0981 Nasal congestion: Secondary | ICD-10-CM

## 2011-10-12 DIAGNOSIS — I10 Essential (primary) hypertension: Secondary | ICD-10-CM

## 2011-10-12 DIAGNOSIS — D649 Anemia, unspecified: Secondary | ICD-10-CM

## 2011-10-12 DIAGNOSIS — E119 Type 2 diabetes mellitus without complications: Secondary | ICD-10-CM

## 2011-10-12 DIAGNOSIS — I2510 Atherosclerotic heart disease of native coronary artery without angina pectoris: Secondary | ICD-10-CM

## 2011-10-12 DIAGNOSIS — M1990 Unspecified osteoarthritis, unspecified site: Secondary | ICD-10-CM

## 2011-10-12 DIAGNOSIS — F411 Generalized anxiety disorder: Secondary | ICD-10-CM

## 2011-10-12 DIAGNOSIS — I872 Venous insufficiency (chronic) (peripheral): Secondary | ICD-10-CM

## 2011-10-12 DIAGNOSIS — K5900 Constipation, unspecified: Secondary | ICD-10-CM

## 2011-10-12 DIAGNOSIS — M810 Age-related osteoporosis without current pathological fracture: Secondary | ICD-10-CM

## 2011-10-12 DIAGNOSIS — E669 Obesity, unspecified: Secondary | ICD-10-CM

## 2011-10-12 DIAGNOSIS — K219 Gastro-esophageal reflux disease without esophagitis: Secondary | ICD-10-CM

## 2011-10-12 DIAGNOSIS — J449 Chronic obstructive pulmonary disease, unspecified: Secondary | ICD-10-CM

## 2011-10-12 LAB — HEPATIC FUNCTION PANEL
AST: 11 U/L (ref 0–37)
Albumin: 3.8 g/dL (ref 3.5–5.2)
Alkaline Phosphatase: 49 U/L (ref 39–117)
Total Bilirubin: 0.4 mg/dL (ref 0.3–1.2)

## 2011-10-12 LAB — LIPID PANEL
HDL: 38.8 mg/dL — ABNORMAL LOW (ref >39.00)
LDL Cholesterol: 46 mg/dL (ref 0–99)
Total CHOL/HDL Ratio: 3
VLDL: 40 mg/dL (ref 0.0–40.0)

## 2011-10-12 LAB — BASIC METABOLIC PANEL
Calcium: 9.5 mg/dL (ref 8.4–10.5)
GFR: 58.74 mL/min — ABNORMAL LOW (ref >60.00)
Glucose, Bld: 157 mg/dL — ABNORMAL HIGH (ref 70–99)
Potassium: 4.7 mEq/L (ref 3.5–5.1)
Sodium: 141 mEq/L (ref 135–145)

## 2011-10-12 LAB — CBC WITH DIFFERENTIAL/PLATELET
Eosinophils Relative: 1.7 % (ref 0.0–5.0)
HCT: 35.3 % — ABNORMAL LOW (ref 36.0–46.0)
Hemoglobin: 11.6 g/dL — ABNORMAL LOW (ref 12.0–15.0)
Lymphs Abs: 1.7 10*3/uL (ref 0.7–4.0)
MCV: 92.7 fl (ref 78.0–100.0)
Monocytes Absolute: 0.6 10*3/uL (ref 0.1–1.0)
Monocytes Relative: 7.9 % (ref 3.0–12.0)
Neutro Abs: 5 10*3/uL (ref 1.4–7.7)
Platelets: 239 10*3/uL (ref 150.0–400.0)
RDW: 12.3 % (ref 11.5–14.6)
WBC: 7.4 10*3/uL (ref 4.5–10.5)

## 2011-10-12 LAB — IBC PANEL: Saturation Ratios: 17.8 % — ABNORMAL LOW (ref 20.0–50.0)

## 2011-10-12 LAB — HEMOGLOBIN A1C: Hgb A1c MFr Bld: 7.8 % — ABNORMAL HIGH (ref 4.6–6.5)

## 2011-10-12 NOTE — Patient Instructions (Addendum)
Today we updated your med list in our EPIC system...    Continue your current medications the same...  Today we did your follow up FASTING blood work...    We will call you w/ the results when avail...  We will arrange for an ENT assessment due to your continued congestion & drainage...    In the meanwhile> continue the [facility]- 2tabs twice daily, lots of water/ fluids by mouth, nasal SALINE spray every hour or so...  We will also refer you to a PULMONARY REHAB program near you to help your breathing & stamina...  Call for any questions...  Let's plan a follow up visit in another 4 months.Marland KitchenMarland Kitchen

## 2011-10-13 LAB — VITAMIN D 25 HYDROXY (VIT D DEFICIENCY, FRACTURES): Vit D, 25-Hydroxy: 38 ng/mL (ref 30–89)

## 2011-10-15 ENCOUNTER — Other Ambulatory Visit: Payer: Self-pay | Admitting: *Deleted

## 2011-10-15 ENCOUNTER — Telehealth: Payer: Self-pay | Admitting: Pulmonary Disease

## 2011-10-15 NOTE — Progress Notes (Addendum)
Subjective:    Patient ID: [name], female    DOB: May 23, 1940, 71 y.o.   MRN: [id number]  HPI 71 y/o WF here for a follow up visit... [name] has 13 grandchildren & 18 great-grands!  She also has mult med problems including severe COPD; HBP; CAD w/prev MI & PTCA followed by DrMcDowell; VI; Hyperlipidemia; DM on insulin; Obesity; GERD/ constipation; DJD/ osteoporosis; Anxiety...  ~  October 24, 2009:  72mo ROV- BS are all over the place (up to 400, down to 100) & we discussed adjusting her 70/30 insulin accordingly (her weight is down to 217#)... under alot of stress w/ 41 y/o mother "driving me nuts"... she saw DrMcDowell 2/11- BP sl elev & Lisinopril incr to 20mg /d... currently on 70/30 insulin taking 25u AM & 20uPM> rec incr doses slowly & titrate to BS values (discussed w/ pt, again).  ~  April 24, 2010:  72mo ROV- she has only incr insulin marginally to 25-30u AM & 20-25u PM and todays lab shows FBS=236, A1c=8.8> we discussed incr her 70/30 insulin to 35u AM & 30u PM & titrate up from there...     BP is controlled on Aten, Lisin, Lasix> 140/82 today & she needs to check more often at home;  she saw DrMcDowell 9/11> HBP/ CAD/ Lipids all stable & no changes made;  she has lost wt down to 215# from peak 258# in 2010 but no wt loss over the last 72mo & we reviewed diet + exercise perscription;  FLP looks good on Simva80 + Fish Oil & meds tol well so we won't make any changes in her regimen...    She is c/o cough, yellow sput, nasal congestion & sneezing, feeling weak & "woozy" w/ mult somatic complaints;  she saw TP 2/6 & given Avelox, Mucinex, Hydromet but has persistant symptoms; **we discussed sinus regimen w/ Saline nasal mist, Mucinex + Fluids, Flonase, plus a brief PredDosepak (next step= ENT referral)...    She also has right knee pain> known degen arthritis & followed by DrNorris for Ortho- given steroid shot 9/11 w/ some transient improvement;  she uses etodolac Prn & has Omep for stomach;   finally asking for refill alprazolam due to anxiety...  ~  October 04, 2010:  72mo ROV & post hosp visit> she was [facility] 3/15 - 05/26/10 by [facility] w/ COPD exac, GIB from HPylori pos gastritis & ulcers, acute blood loss anemia, chr renal insuffic, etc;  She improved w/ COPD rx, Pred, ACE ch to ARB, Mucinex, etc;  EGD by DrJacobs w/ Ab rx for HPylori & DC NSAIDs, Boniva, etc;  Anemia gradually improved w/ oral Fe therapy;  Renal is stable w/ Creat 1.2-1.4...    She has mult somatic complaints & doesn't feel well> c/o LBP some radiation into legs; she is off prev ASA & Lodine due to GIB, just using Tylenol & topical rubs for pain;  We discussed trying rest, heat, Tramadol w/ Tylenol & refer to Ortho for further eval...    BP control is fair off the ACE now & she didn't bring med bottles to review compliance; she remains on Simva80 + Fish Oil for her Lipids; she has adjusted her insulin up to 30uAM & 25uPM of her Humulin70/30 & she continues on the Metformin; she has not lost any weight; I indicated to her that if we are to help her control her DM & mult medical problems then she will need to see [place] every 3months but she is reluctant due  to the number of specialists that she sees...  ~  February 06, 2011:  81mo ROV & she is c/o some head & chest congestion, cough w/ thick brown sputum, sl moe SOB, denies f/c/s, denies CP, etc;  We reviewed meds> NEBS regularly, Mucinex, Fluids, & she wants ZPak for infection- ok...  She requests refills, declines Flu shot...  Labs today w/ Chol ok but TG 221 & needs better diet; BS 237, A1c 8.0 is actually sl better on Novolin 70/30 taking 25uBid + Metformin 500Bid (we reviewed recs for slowly increasing dose)...    She saw DrMcDowell for Cards f/u 10/12> stable w/ longstanding CAD, prev PTCA, not having angina, mobility limited, & he rec continued med rx & observation...    She saw DrAplington 8/12 w/ LBP spondylosis & spinal stenosis> given PT, poor operative risk, wt loss will help,  trial TENS, continue meds and Neurontin...  ~  June 07, 2011:  81mo ROV & [place] notes "I'm not doin" c/o depressed (Mother died recently) & angry at [facility]- wants another DME company;  States that she's hurting all over all the time...    She has severe COPD on O2, NEBS w/ AlbutQid & BudesBid, Mucinex, etc; breathing is at baseline DOE w/ ADLs & no real change...    BP controlled on 3 med regimen & she denies CP, palpit, dizzy/syncope, edema, etc...    Lipids under control w/ Simva80 & tol well; FLP 12/12 was ok x for elev TG & we reviewed low fat diet restriction... LABS 4/13:  Chems- ok x BS=202 A1c=7.9;  CBC- ok x Hg=11.6  ~  October 12, 2011:  81mo ROV & [name] is c/o head congestion, drainage, cough & thick whitish sput; denies fever, chills, sweats, discolored phlegm or hemoptysis; she has NEBS w/ duoneb, Pulmicort, Mucinex, Flonase, MMW.; she is very distressed about being "stopped up" & wants ENT assessment so we will refer...     HBP controlled on Aten, Losar, Lasix; BP= 118/70 & she denies CP, palpit, change in SOB or edema...    CAD w/ prev MI & PTCAs folowed by DrMcDowell in [place] on above meds + ASA...    Dyslipidemia on Zocor80 w/ Chol numbers at goals but Tg still elev & she's been instructed on low fat diet as well...    DM treated w/ Metform & 70/30 insulin currently taking 28u Bid...    She remains obese & is disappointed w/ her esults on diet; wt= 206# & BMI~35; we reviewed diet, exercise, wt reduction strategies... LABS 8/13:  FLP- chol ok on Simva80 but TG=200;  Chems- ok x BS=157 A1c=7.8 on Metform+Insulin;  CBC- ok x Hg=11.6, Fe=59;  TSH=1.35;  VitD=38        Problem List:  MAXILLARY SINUSITIS (ICD-461.0)  - hx sinusitis symptoms 3/09 w/ CTSinus neg for acute changes... Rx'd w/ MUCINEX, Nasal Saline, FLONASE...  2/11>> c/o sinusitis & AUGMENTIN perscribed.  COPD (ICD-496) - she has COPD and is an ex-smoker... controlled on NEBULIZER w/ Duoneb Qid + Budesonide Bid, MUCINEX, etc...  she has end stage disease w/ severe dyspnea w/ ADLs and no change over the last yr... ~  baseline CXR = COPD, incr heart size, NAD. ~ CT Chest 2/02 showed COPD, Atx, sl scarring, NAD. ~  bronchoscopy 3/02 w/ mucous plugging, no endobr lesions. ~  PFT's in 12/07 showed FVC=0.81 (27%), FEV1=0.54 (22%), %1sec=67, mid-flows=13%pred. ~  CXR 1/10 showed chr changes, cardiomeg, lingular atx. ~  CXR 2/11 showed cardiomeg, bibasilar  scarring, NAD. ~  CXR 2/12 showed cardiomeg, COPD,scarring left base, NAD.  HYPERTENSION (ICD-401.9) - controlled on ATENOLOL 50mg /d, LOSARTAN 50mg /d, LASIX 40mg /d... BP=146/78 and tol well... denies HA, visual changes, CP, palipit, dizziness, syncope, change in dyspnea, edema, etc. ~  Jan10: tried to change ACE to Diovan but too $$$ & requested change back to Lisinopril==> eventually changed to generic LOSARTAN...  CORONARY ARTERY DISEASE (ICD-414.00) - on ASA 81mg /d... hx of CAD w/ prev MI in 2000, and subseq PTCA's in 2000, & 2001... ~  last cath 2001 w/ PTCA to mid-LAD lesion w/ cutting balloon & brachytherapy for in-stent restenosis. ~  last hosp 6/08 w/ CP- felt to be non-cardiac... ~  last 2DECho 6/08 showed HK of inferoseptal area, EF=55%... ~  last NuclearStressTest 7/08 w/ mod ant/apical/septal infarct, apical HK, no ischemia, EF=?53%... ~  [facility]- notes reviewed... ~  4/13: Note from DrMcDowell> compliant w/ meds but not exercising, unsuccessful w/ wt loss, CAD/ BP/ Lipids- stable, no changes made...  VENOUS INSUFFICIENCY (ICD-459.81) - low sodium diet, elevation, & Lasix 40-80mg /d... states she "allergic" to TED hose! and refuses to wear them...   HYPERLIPIDEMIA (ICD-272.4) - on ZOCOR 80mg /d + FISH OIL 1000mg /d ... she has been counselled on diet & exercise but unsuccessful... ~  FLP 11/08 on Simva40 showed TChol 150, TG 185, HDL 35, LDL 78 ~  FLP 3/09 on Simva40 showed TChol 139, TG 193, HDL 30, LDL 70... DrMcDowell incr to  Simva80 9/09. ~  FLP 2/10 on Simva80 showed TChol 155, TG 137, HDL 43, LDL 85 ~  FLP 10/10 showed TChol 129, TG 96, HDL 39, LDL 71 ~  FLP 8/11 showed TChol 130, TG 152, HDL 30, LDL 70 ~  FLP 2/12 showed TChol 139, TG 96, HDL 40, LDL 80 ~  FLP 12/12 on Simva80 showed TChol 154, TG 221, HDL 39, LDL 81... LFTs remain wnl; rec> better low fat diet... ~  FLP 8/13 on Simva80 showed TChol 125, TG 200, HDL 39, LDL 46  DM (ICD-250.00) - on HUMULIN 70/30- now taking 25uAM & 25uPM she says & GLUCOPHAGE 500mg Bid (off prev Avandia)... ~  BS=142 & HgA1c=6.6 in Nov08... ~  Labs 3/09 showed BS= 112, HgA1c= 6.8 ~  labs 7/09 showed BS= 197, HgA1c= 7.1.Marland KitchenMarland Kitchen rec- better diet! ~  labs 2/10 showed BS= 134, A1c= 7.0 ~  labs 10/10 showed BS= 151, A1c= 6.9 ~  labs 2/11 showed BS= 239, A1c= 8.1.Marland KitchenMarland Kitchen pt instructed to up titrate insulin til BS= 120-150 range. ~  labs 8/11 showed BS= 215, A1c= 8.4... again instructed to incr insulin slowly & titrate to BS values. ~  labs 2/12 showed BS= 236, A1c= 8.8.Marland KitchenMarland Kitchen rec to incr 70/30 insulin incrementally... ~  Labs 8/12 showed BS= 199, A1c= 9.2.Marland KitchenMarland Kitchen rec slowly incr her 70/30 insulin doses as discussed. ~  Labs 12/12 on 70/30Insulin25uBid+Metform500Bid showed BS= 237, A1c= 8.0..Marland Kitchen We discussed incr to 30uAM & 30uPM... ~  Labs 8/13 on 70/30Insulin28uBid+Metform500Bid showed BS= 157, A1c= 7.8.Marland KitchenMarland Kitchen rec incr 70/30 insulin to 30uBid...  OBESITY (ICD-278.00) - we discussed diet + exercise program required to lose weight! ~  weight 2008 = 235# ~  weight 2/10 = 250# ~  weight 10/10 = 258# ~  weight 2/11 = 241# ~  weight 8/11 = 217# ~  weight 2/12 = 215# ~  Weight 8/12 = 214# ~  Weight 12/12 = 209# ~  Weight 8/13 = 206#  GERD (ICD-530.81) - on PPI therapy (changed 2/10 per  request to OMEPRAZOLE20mg /d)... ~  [facility] 3/12 for COPD exac & was anemic w/ heme pos stool & GIB; EGD by DrJacobs w/ gastritis & prepyloric ulcers, +HPylori- treated & improved.  CONSTIPATION (ICD-564.00) - last  colonoscopy was 11/07 by DrJacobs and negative...  DEGENERATIVE JOINT DISEASE (ICD-715.90) - prev CSpine films w/ spondy and biforaminal narrowing... LSSpine films w/ DDD and osteopenia...  ~  she saw DrDrake, Ortho in [place], for shot in right foot. ~  9/11:  she saw DrNorris for shot in right knee. ~  8/12: presents c/o LBP, leg pain; XRay Lumbar spine w/ DDD, narrowing, osteophytes, facet dis, etc; Rx w/ rest, heat, TRAMADOL, ROBAXIN, refer to Ortho. ~  She has followed up w/ DrApplington> not a surg candidate for her lumbar spondylosis & sp stenosis...  OSTEOPOROSIS (ICD-733.00) - on BONIVA 150mg /mo, Calcium, Vits... she needs a BMD... ~  labs 2/11 showed Vit D level = 27... OK to change to 1000 u daily. ~  labs 2/12 showed Vit D level = 37... rec to continue 1000u supplement ~  BMD 5/13 showed TScore -1.6 in hips & rec to continue boniva, calcium, MVI, VitD... ~  Labs 8/13 showed Vit D level = 38... Continue supplement.  ANXIETY (ICD-300.00) - uses Alprazolam 0.5mg  as needed...  ANEMIA, MILD (ICD-285.9) ~  labs 2/10 showed Hg= 11.9, Fe= 51... ~  labs 10/10 showed Hg= 11.9, Fe= 50... rec OTC Fe supplement. ~  labs 2/11 showed Hg= 13.4, Fe= 52 (14%sat) ~  labs 2/12 showed Hg= 13.1, MCV= 93 ~  Labs 8/12 showed Hg= 12.0, Fe= 61 (15%sat); rec to continue oral iron... ~  Labs 8/13 showed Hg= 11.6, Fe= 59; rec to continue oral iron supplement.  Health Maintenance - she hasn't seen GYN in yrs and is asked to re-establish... prev refused Mammograms due to the discomfort & bruising- she will discuss w/ Gyn & get a follow up BMD as well...   Past Surgical History  Procedure Date  . Total abdominal hysterectomy   . Cholecystectomy     Outpatient Encounter Prescriptions as of 10/12/2011  Medication Sig Dispense Refill  . acetaminophen (TYLENOL) 500 MG tablet Take 500 mg by mouth. As needed       . albuterol (PROVENTIL) (2.5 MG/3ML) 0.083% nebulizer solution Take 3 mLs (2.5 mg total) by  nebulization 4 (four) times daily.  75 mL  11  . ALPRAZolam (XANAX) 0.5 MG tablet TAKE ONE-HALF TO ONE TABLET BY MOUTH THREE TIMES DAILY AS NEEDED  90 tablet  5  . Alum & Mag Hydroxide-Simeth (MAGIC MOUTHWASH) SOLN 1 tsp gargle and swallow four times daily as needed for sore throat  120 mL  1  . atenolol (TENORMIN) 50 MG tablet TAKE ONE TABLET BY MOUTH EVERY DAY  30 tablet  6  . BONIVA 150 MG tablet TAKE 1 TABLET BY MOUTH ONCE A MONTH  1 each  11  . budesonide (PULMICORT) 0.25 MG/2ML nebulizer solution Take 2 mLs (0.25 mg total) by nebulization 2 (two) times daily.  60 mL  11  . calcium-vitamin D (OSCAL WITH D) 500-200 MG-UNIT per tablet Take 1 tablet by mouth 2 (two) times daily.        . Cholecalciferol (VITAMIN D3) 1000 UNITS CAPS Take by mouth daily.        Marland Kitchen dextromethorphan-guaiFENesin (MUCINEX DM) 30-600 MG per 12 hr tablet Take 2 tablets by mouth every 12 (twelve) hours.        Marland Kitchen etodolac (LODINE) 400 MG tablet  TAKE ONE TABLET BY MOUTH EVERY 12 HOURS  60 tablet  3  . ferrous sulfate 325 (65 FE) MG tablet Take by mouth daily with breakfast.        . fluticasone (FLONASE) 50 MCG/ACT nasal spray USE TWO SPRAY EACH NOSTRIL TWICE DAILY  16 g  5  . furosemide (LASIX) 40 MG tablet TAKE ONE TABLET BY MOUTH EVERY DAY  30 tablet  6  . gabapentin (NEURONTIN) 100 MG capsule TAKE THREE CAPSULES BY MOUTH AT BEDTIME  100 capsule  3  . insulin NPH-insulin regular (NOVOLIN 70/30) (70-30) 100 UNIT/ML injection Inject 28   units in the morning and 28  units in the evening      . ipratropium (ATROVENT) 0.02 % nebulizer solution Take 2.5 mLs (500 mcg total) by nebulization 4 (four) times daily.  75 mL  11  . losartan (COZAAR) 50 MG tablet TAKE ONE TABLET BY MOUTH EVERY DAY  30 tablet  4  . metFORMIN (GLUCOPHAGE) 500 MG tablet TAKE ONE TABLET BY MOUTH TWICE DAILY  60 tablet  6  . nitroGLYCERIN (NITROSTAT) 0.4 MG SL tablet Place 1 tablet (0.4 mg total) under the tongue every 5 (five) minutes as needed. May repeat  x3  25 tablet  6  . omeprazole (PRILOSEC) 20 MG capsule TAKE ONE CAPSULE BY MOUTH EVERY DAY 30 MINUTES BEFORE A MEAL  30 capsule  11  . simvastatin (ZOCOR) 80 MG tablet Take 1 tablet (80 mg total) by mouth at bedtime.  30 tablet  11  . traMADol (ULTRAM) 50 MG tablet TAKE ONE TABLET BY MOUTH EVERY 8 HOURS AS NEEDED FOR PAIN  90 tablet  1  . vitamin B-12 (CYANOCOBALAMIN) 500 MCG tablet Take 1,000 mcg by mouth daily.          Allergies  Allergen Reactions  . Codeine     vomiting  . Terbutaline Sulfate     REACTION: INTOL to terbutaline    Current Medications, Allergies, Past Medical History, Past Surgical History, Family History, and Social History were reviewed in [employer] record.    Review of Systems         See HPI - all other systems neg except as noted...   The patient complains of dyspnea on exertion, peripheral edema, muscle weakness, and difficulty walking.  The patient denies anorexia, fever, weight loss, weight gain, vision loss, decreased hearing, hoarseness, chest pain, syncope, prolonged cough, headaches, hemoptysis, abdominal pain, melena, hematochezia, severe indigestion/heartburn, hematuria, incontinence, suspicious skin lesions, transient blindness, depression, unusual weight change, abnormal bleeding, enlarged lymph nodes, and angioedema.    Objective:   Physical Exam    WD, Obese, 71 y/o WF in NAD... GENERAL:  Alert & oriented; pleasant & cooperative...  HEENT:  [place]/AT, EOM-wnl, PERRLA, EACs-clear, TMs-wnl, NOSE-clear, THROAT-clear & wnl. NECK:  Supple w/ fairROM; no JVD; normal carotid impulses w/o bruits; no thyromegaly or nodules palpated; no lymphadenopathy. CHEST:  Decr BS bilat, few scat rhonchi, without wheezing, rales, or rubs...upper airway psuedo-wheezing HEART:  Regular Rhythm;  gr 1/6 SEM without rubs or gallops detected... ABDOMEN:  Obese w/ panniculus, soft & nontender; normal bowel sounds; no organomegaly or masses  detected. EXT: without deformities, mild arthritic changes; no varicose veins/ +venous insuffic/ tr edema. Sl tender on palpation over lumbar area & siatic notch... NEURO:  CN's intact; no focal neuro deficits... DERM:  no lesion seen...  RADIOLOGY DATA:  Reviewed in the EPIC EMR & discussed w/ the patient...  LABORATORY DATA:  Reviewed in the [facility] EMR & discussed w/ the patient...   Assessment & Plan:   COPD>  Severe, continue home Oxygen, Nebulizer, Mucinex, & Fluids;  We will Rx w/ ZPak & reviewed her max regimen; She will also continue vigorous nasal regimen as discussed...  HBP>  Fair control on Atenolol50, Losartan50, Furosemide40; discussed diet, must get wt down, no sodium...  CAD>  Followed by DrMcDowell in [place] office, no angina, stable clinically, continue medical management...  HYPERLIPID>  On Simva80 + Fish Oil; needs better diet & get wt down!  DM>  Control remains poor as she has been unable to diet effectively, lose weight, or incrementally increase her insulin properly to get sugars under control;  Again asked to slowly inc her 70/30 doses by 1-2 u increments until she is taking 30uBid.  OBESITY>  Weight reduction is key to improvement in all areas...  GI> GERD, Ulcers, +HPylori, Constip>  Improved after HPylori Rx & back on Omep 20mg /d...  DJD, LBP, etc>  Lumbar XRay reviewed, trial Tramadol, Robaxin, rest/ heat/ etc... Refer to Ortho for further eval & Rx...  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  Anxiety>  Alprazolam for prn use- refilled...  Anemia>  On Fe daily and Hg is improved (s/p hosp w/ GIB)...   Patient's Medications  New Prescriptions   No medications on file  Previous Medications   ACETAMINOPHEN (TYLENOL) 500 MG TABLET    Take 500 mg by mouth. As needed    ALBUTEROL (PROVENTIL) (2.5 MG/3ML) 0.083% NEBULIZER SOLUTION    Take 3 mLs (2.5 mg total) by nebulization 4 (four) times daily.   ALPRAZOLAM  (XANAX) 0.5 MG TABLET    TAKE ONE-HALF TO ONE TABLET BY MOUTH THREE TIMES DAILY AS NEEDED   ALUM & MAG HYDROXIDE-SIMETH (MAGIC MOUTHWASH) SOLN    1 tsp gargle and swallow four times daily as needed for sore throat   ATENOLOL (TENORMIN) 50 MG TABLET    TAKE ONE TABLET BY MOUTH EVERY DAY   BONIVA 150 MG TABLET    TAKE 1 TABLET BY MOUTH ONCE A MONTH   BUDESONIDE (PULMICORT) 0.25 MG/2ML NEBULIZER SOLUTION    Take 2 mLs (0.25 mg total) by nebulization 2 (two) times daily.   CALCIUM-VITAMIN D (OSCAL WITH D) 500-200 MG-UNIT PER TABLET    Take 1 tablet by mouth 2 (two) times daily.     CHOLECALCIFEROL (VITAMIN D3) 1000 UNITS CAPS    Take by mouth daily.     DEXTROMETHORPHAN-GUAIFENESIN (MUCINEX DM) 30-600 MG PER 12 HR TABLET    Take 2 tablets by mouth every 12 (twelve) hours.     ETODOLAC (LODINE) 400 MG TABLET    TAKE ONE TABLET BY MOUTH EVERY 12 HOURS   FERROUS SULFATE 325 (65 FE) MG TABLET    Take by mouth daily with breakfast.     FLUTICASONE (FLONASE) 50 MCG/ACT NASAL SPRAY    USE TWO SPRAY EACH NOSTRIL TWICE DAILY   FUROSEMIDE (LASIX) 40 MG TABLET    TAKE ONE TABLET BY MOUTH EVERY DAY   GABAPENTIN (NEURONTIN) 100 MG CAPSULE    TAKE THREE CAPSULES BY MOUTH AT BEDTIME   INSULIN NPH-INSULIN REGULAR (NOVOLIN 70/30) (70-30) 100 UNIT/ML INJECTION    Inject 28   units in the morning and 28  units in the evening   IPRATROPIUM (ATROVENT) 0.02 % NEBULIZER SOLUTION    Take 2.5 mLs (500 mcg total) by nebulization 4 (four) times daily.  LOSARTAN (COZAAR) 50 MG TABLET    TAKE ONE TABLET BY MOUTH EVERY DAY   METFORMIN (GLUCOPHAGE) 500 MG TABLET    TAKE ONE TABLET BY MOUTH TWICE DAILY   NITROGLYCERIN (NITROSTAT) 0.4 MG SL TABLET    Place 1 tablet (0.4 mg total) under the tongue every 5 (five) minutes as needed. May repeat x3   OMEPRAZOLE (PRILOSEC) 20 MG CAPSULE    TAKE ONE CAPSULE BY MOUTH EVERY DAY 30 MINUTES BEFORE A MEAL   SIMVASTATIN (ZOCOR) 80 MG TABLET    Take 1 tablet (80 mg total) by mouth at bedtime.    TRAMADOL (ULTRAM) 50 MG TABLET    TAKE ONE TABLET BY MOUTH EVERY 8 HOURS AS NEEDED FOR PAIN   VITAMIN B-12 (CYANOCOBALAMIN) 500 MCG TABLET    Take 1,000 mcg by mouth daily.   Modified Medications   No medications on file  Discontinued Medications   No medications on file

## 2011-10-15 NOTE — Telephone Encounter (Signed)
Called spoke with patient who is requesting documentation that she is recommended to take mucinex > pt reports she lives in low-income housing and having to buy this "expensive medication" will give her a discount on her rent.  Pt stated is okay to be in rx form and mailed to her home.  Have hand-written the rx as follows: Ms [name] has been prescribed to take mucinex 2 tabs twice daily for cough and congestion.  This has been signed by SN and placed in the mail to pt's verified home address.  A copy has been made and sent to be scanned.  Nothing further needed; will sign off.

## 2011-10-15 NOTE — Progress Notes (Signed)
Quick Note:  Pt aware of results and recs to change medications. Updated in EPIC as well. ______

## 2011-10-22 ENCOUNTER — Telehealth: Payer: Self-pay | Admitting: Pulmonary Disease

## 2011-10-22 NOTE — Telephone Encounter (Signed)
Called and spoke with pt and she stated that she never did get the rx from 8-12 phone note that [place] sent.  She is really needing this rx.  rx has been completed and placed in the mail today.  Pt is aware.

## 2011-10-30 ENCOUNTER — Other Ambulatory Visit: Payer: Self-pay | Admitting: Pulmonary Disease

## 2011-11-01 ENCOUNTER — Ambulatory Visit (INDEPENDENT_AMBULATORY_CARE_PROVIDER_SITE_OTHER): Payer: Medicare Other | Admitting: Otolaryngology

## 2011-11-01 DIAGNOSIS — J343 Hypertrophy of nasal turbinates: Secondary | ICD-10-CM

## 2011-11-01 DIAGNOSIS — J310 Chronic rhinitis: Secondary | ICD-10-CM

## 2011-11-19 ENCOUNTER — Other Ambulatory Visit: Payer: Self-pay | Admitting: Adult Health

## 2011-11-19 ENCOUNTER — Encounter (HOSPITAL_COMMUNITY): Payer: Self-pay | Admitting: *Deleted

## 2011-11-19 ENCOUNTER — Other Ambulatory Visit: Payer: Self-pay | Admitting: Pulmonary Disease

## 2011-11-19 ENCOUNTER — Emergency Department (HOSPITAL_COMMUNITY): Payer: Medicare Other

## 2011-11-19 ENCOUNTER — Telehealth: Payer: Self-pay | Admitting: Pulmonary Disease

## 2011-11-19 ENCOUNTER — Emergency Department (HOSPITAL_COMMUNITY)
Admission: EM | Admit: 2011-11-19 | Discharge: 2011-11-20 | Disposition: A | Discharge status: Home / Self Care | Payer: Medicare Other | Attending: Emergency Medicine | Admitting: Emergency Medicine

## 2011-11-19 DIAGNOSIS — Z885 Allergy status to narcotic agent status: Secondary | ICD-10-CM | POA: Insufficient documentation

## 2011-11-19 DIAGNOSIS — Z794 Long term (current) use of insulin: Secondary | ICD-10-CM | POA: Insufficient documentation

## 2011-11-19 DIAGNOSIS — Z87891 Personal history of nicotine dependence: Secondary | ICD-10-CM | POA: Insufficient documentation

## 2011-11-19 DIAGNOSIS — F411 Generalized anxiety disorder: Secondary | ICD-10-CM | POA: Insufficient documentation

## 2011-11-19 DIAGNOSIS — Z8249 Family history of ischemic heart disease and other diseases of the circulatory system: Secondary | ICD-10-CM | POA: Insufficient documentation

## 2011-11-19 DIAGNOSIS — E669 Obesity, unspecified: Secondary | ICD-10-CM | POA: Insufficient documentation

## 2011-11-19 DIAGNOSIS — E119 Type 2 diabetes mellitus without complications: Secondary | ICD-10-CM | POA: Insufficient documentation

## 2011-11-19 DIAGNOSIS — I2510 Atherosclerotic heart disease of native coronary artery without angina pectoris: Secondary | ICD-10-CM | POA: Insufficient documentation

## 2011-11-19 DIAGNOSIS — R6884 Jaw pain: Secondary | ICD-10-CM | POA: Insufficient documentation

## 2011-11-19 MED ORDER — HYDROCODONE-ACETAMINOPHEN 5-325 MG PO TABS
1.0000 | ORAL_TABLET | Freq: Once | ORAL | Status: AC
  Administered 2011-11-19: 1 via ORAL
  Filled 2011-11-19: qty 1

## 2011-11-19 MED ORDER — PENICILLIN V POTASSIUM 250 MG PO TABS
500.0000 mg | ORAL_TABLET | Freq: Once | ORAL | Status: AC
  Administered 2011-11-19: 500 mg via ORAL
  Filled 2011-11-19: qty 2

## 2011-11-19 MED ORDER — IBUPROFEN 400 MG PO TABS
400.0000 mg | ORAL_TABLET | Freq: Once | ORAL | Status: AC
Start: 2011-11-19 — End: 2011-11-19
  Administered 2011-11-19: 400 mg via ORAL
  Filled 2011-11-19: qty 1

## 2011-11-19 NOTE — ED Notes (Signed)
Had mandibular molar extracted in June. Having pain in site of extraction for 4-5 weeks and radiates up into her head and face.  Has been to her dentist and he has packed it. But is no better.

## 2011-11-19 NOTE — Telephone Encounter (Signed)
Per SN pt should get a  second opinion from an Transport planner. [id number] Carron Curie, CMA

## 2011-11-19 NOTE — Telephone Encounter (Signed)
I spoke with daughter Rowland Lathe and she stated pt had a tooth pulled back in June and it is not healing. Pt has been to the dentist about 3 times and he just keeps "packing it", pt c/o right side face being very sore, her right ear hurts, and hurts for her to even talk. Rowland Lathe thinks it is infected. She is requesting recs from SN since the pt's dentists does not seem to be doing anything that helps. Please advise SN thanks  Allergies  Allergen Reactions  . Codeine     vomiting  . Terbutaline Sulfate     REACTION: INTOL to terbutaline

## 2011-11-19 NOTE — Telephone Encounter (Signed)
Spoke with [name] Pound and notified of recs per SN She verbalized understanding and states nothing further needed

## 2011-11-19 NOTE — Telephone Encounter (Signed)
Pt's daughter returned triage's call.  Holly D Pryor ° °

## 2011-11-20 MED ORDER — HYDROCODONE-ACETAMINOPHEN 5-325 MG PO TABS: 1.0000 | ORAL_TABLET | Freq: Four times a day (QID) | ORAL | Status: DC | PRN

## 2011-11-20 MED ORDER — PENICILLIN V POTASSIUM 500 MG PO TABS: 500.0000 mg | ORAL_TABLET | Freq: Four times a day (QID) | ORAL | Status: DC

## 2011-11-20 MED ORDER — PENICILLIN V POTASSIUM 500 MG PO TABS: 500.0000 mg | ORAL_TABLET | Freq: Four times a day (QID) | ORAL | Status: AC

## 2011-11-20 MED ORDER — HYDROCODONE-ACETAMINOPHEN 5-325 MG PO TABS: 1.0000 | ORAL_TABLET | Freq: Four times a day (QID) | ORAL | Status: AC | PRN

## 2011-11-20 NOTE — ED Provider Notes (Signed)
Medical screening examination/treatment/procedure(s) were performed by non-physician practitioner and as supervising physician I was immediately available for consultation/collaboration.    Vida Roller, MD 11/20/11 [phone]

## 2011-11-20 NOTE — ED Provider Notes (Signed)
History     CSN: [id number]  Arrival date & time 11/19/11  2016   First MD Initiated Contact with Patient 11/19/11 2300      Chief Complaint  Patient presents with  . Jaw Pain    (Consider location/radiation/quality/duration/timing/severity/associated sxs/prior treatment) HPI Comments: Pt had R lower molar extracted ~ 5-6 weeks ago by dr. Melvyn Neth in [place].  States it was breaking apart when he was removing and thinks there may be some bone fragments retained in her gum.  She has been back to see him twice since the extraction and it is not getting any better.  No fever or chills.  The history is provided by the patient. No language interpreter was used.    Past Medical History  Diagnosis Date  . COPD (chronic obstructive pulmonary disease)   . Hyperlipidemia   . Essential hypertension, benign   . Anemia   . Anxiety   . Arthritis   . Coronary atherosclerosis of native coronary artery     PTCA/BMS LAD 2000, PTCA/brachytherapy LAD 2001, LVEF 55%  . Type 2 diabetes mellitus   . Osteoporosis   . Venous insufficiency   . Obesity   . Myocardial infarction     AMI 2000    Past Surgical History  Procedure Date  . Total abdominal hysterectomy   . Cholecystectomy   . Rotator cuff repair     Family History  Problem Relation Age of Onset  . Coronary artery disease Other     History  Substance Use Topics  . Smoking status: Former Smoker -- 1.0 packs/day for 30 years    Types: Cigarettes    Quit date: 03/05/1998  . Smokeless tobacco: Not on file  . Alcohol Use: No    OB History    Grav Para Term Preterm Abortions TAB SAB Ect Mult Living                  Review of Systems  Constitutional: Negative for fever and chills.  HENT: Positive for dental problem.   Respiratory: Negative for shortness of breath and wheezing.   Cardiovascular: Negative for chest pain.  All other systems reviewed and are negative.    Allergies  Codeine and Terbutaline sulfate  Home  Medications   Current Outpatient Rx  Name Route Sig Dispense Refill  . ACETAMINOPHEN 500 MG PO TABS Oral Take 500 mg by mouth every 6 (six) hours as needed. pain    . ALBUTEROL SULFATE (2.5 MG/3ML) 0.083% IN NEBU Nebulization Take 3 mLs (2.5 mg total) by nebulization 4 (four) times daily. 75 mL 11  . ALPRAZOLAM 0.5 MG PO TABS Oral Take 0.5 mg by mouth 3 (three) times daily.    Marland Kitchen MAGIC MOUTHWASH  1 tsp gargle and swallow four times daily as needed for sore throat 120 mL 1  . ATENOLOL 50 MG PO TABS  TAKE ONE TABLET BY MOUTH EVERY DAY 30 tablet 6  . BUDESONIDE 0.25 MG/2ML IN SUSP Nebulization Take 2 mLs (0.25 mg total) by nebulization 2 (two) times daily. 60 mL 11  . CALCIUM CARBONATE-VITAMIN D 500-200 MG-UNIT PO TABS Oral Take 1 tablet by mouth 2 (two) times daily.      Marland Kitchen VITAMIN D3 1000 UNITS PO CAPS Oral Take by mouth daily.      . ETODOLAC 400 MG PO TABS  TAKE ONE TABLET BY MOUTH EVERY 12 HOURS 60 tablet 3  . FERROUS SULFATE 325 (65 FE) MG PO TABS Oral Take by mouth daily  with breakfast.      . FLUTICASONE PROPIONATE 50 MCG/ACT NA SUSP  USE TWO SPRAY EACH NOSTRIL TWICE DAILY 16 g 5  . FUROSEMIDE 40 MG PO TABS  TAKE ONE TABLET BY MOUTH EVERY DAY 30 tablet 6  . GABAPENTIN 100 MG PO CAPS  TAKE THREE CAPSULES BY MOUTH AT BEDTIME 100 capsule 3  . INSULIN ISOPHANE & REGULAR (70-30) 100 UNIT/ML [place] SUSP Subcutaneous Inject 30 Units into the skin 2 (two) times daily.    . IPRATROPIUM BROMIDE 0.02 % IN SOLN Nebulization Take 2.5 mLs (500 mcg total) by nebulization 4 (four) times daily. 75 mL 11  . LOSARTAN POTASSIUM 50 MG PO TABS  TAKE ONE TABLET BY MOUTH EVERY DAY 30 tablet 4  . METFORMIN HCL 500 MG PO TABS  TAKE ONE TABLET BY MOUTH TWICE DAILY 60 tablet 6  . OMEPRAZOLE 20 MG PO CPDR  TAKE ONE CAPSULE BY MOUTH EVERY DAY 30 MINUTES BEFORE A MEAL 30 capsule 10  . SIMVASTATIN 80 MG PO TABS Oral Take 1 tablet (80 mg total) by mouth at bedtime. 30 tablet 11  . TRAMADOL HCL 50 MG PO TABS  TAKE ONE TABLET BY  MOUTH EVERY 8 HOURS AS NEEDED FOR PAIN 90 tablet 5  . VITAMIN B-12 500 MCG PO TABS Oral Take 1,000 mcg by mouth daily.     Marland Kitchen BONIVA 150 MG PO TABS  TAKE 1 TABLET BY MOUTH ONCE A MONTH 1 each 11  . DM-GUAIFENESIN ER 30-600 MG PO TB12 Oral Take 2 tablets by mouth every 12 (twelve) hours.      Marland Kitchen HYDROCODONE-ACETAMINOPHEN 5-325 MG PO TABS Oral Take 1 tablet by mouth every 6 (six) hours as needed for pain. 20 tablet 0  . NITROGLYCERIN 0.4 MG SL SUBL Sublingual Place 1 tablet (0.4 mg total) under the tongue every 5 (five) minutes as needed. May repeat x3 25 tablet 6  . PENICILLIN V POTASSIUM 500 MG PO TABS Oral Take 1 tablet (500 mg total) by mouth 4 (four) times daily. 40 tablet 0    BP 181/83  Pulse 77  Temp 98.8 F (37.1 C) (Oral)  Resp 20  Ht 5\' 4"  (1.626 m)  Wt 203 lb (92.08 kg)  BMI 34.84 kg/m2  SpO2 97%  Physical Exam  Nursing note and vitals reviewed. Constitutional: She is oriented to person, place, and time. She appears well-developed and well-nourished. No distress.  HENT:  Head: Normocephalic and atraumatic.  Mouth/Throat: Uvula is midline and mucous membranes are normal. Normal dentition. No dental abscesses, uvula swelling or dental caries. No oropharyngeal exudate, posterior oropharyngeal edema or posterior oropharyngeal erythema.    Eyes: EOM are normal.  Neck: Normal range of motion.  Cardiovascular: Normal rate, regular rhythm and normal heart sounds.   Pulmonary/Chest: Effort normal and breath sounds normal.  Abdominal: Soft. She exhibits no distension. There is no tenderness.  Musculoskeletal: Normal range of motion.  Neurological: She is alert and oriented to person, place, and time.  Skin: Skin is warm and dry.  Psychiatric: She has a normal mood and affect. Judgment normal.    ED Course  Procedures (including critical care time)  Labs Reviewed - No data to display Ct Maxillofacial Wo Cm  11/20/2011  *RADIOLOGY REPORT*  Clinical Data: Jaw pain.  Right lower  molar extraction June 2013.  CT MAXILLOFACIAL WITHOUT CONTRAST  Technique:  Multidetector CT imaging of the maxillofacial structures was performed. Multiplanar CT image reconstructions were also generated.  Comparison: None.  Findings: There is a large socket in the right mandible representing the area of previous molar wisdom tooth extraction. This contains a few small bubbles of air.  No definite retained tooth fragments.  Adjacent inflammatory change in the medial mandible without osteomyelitis with a 6 x 14 mm fluid collection as seen on image 88 series 2.  The patient is edentulous in the maxilla.  Mandibular incisors and a right mandibular canine remain. Both TMJs are located.  No pathologic adenopathy.  Airway midline.  Bilateral carotid calcification. No significant sinus opacity.  IMPRESSION: The socket left by right mandibular wisdom tooth extraction remains with slight adjacent inflammatory changes. No retained tooth fragments.  Focal medial abscess not excluded.  No visible mandibular osteomyelitis.   Original Report Authenticated By: Elsie Stain, M.D.      1. Mandible pain     Initial order for mandible x-ray changes after speaking with rad tech.  She advised that the  Imaging  For bony fragments would be much better on CT.  MDM  rx-pen VK 500 mg , 40 rx-hydrocodone, 20 F/u with your dentist        Evalina Field, PA 11/20/11 [phone]

## 2011-11-30 ENCOUNTER — Other Ambulatory Visit: Payer: Self-pay | Admitting: *Deleted

## 2011-11-30 DIAGNOSIS — E785 Hyperlipidemia, unspecified: Secondary | ICD-10-CM

## 2011-12-03 ENCOUNTER — Other Ambulatory Visit: Payer: Self-pay | Admitting: Pulmonary Disease

## 2011-12-14 ENCOUNTER — Encounter: Payer: Self-pay | Admitting: *Deleted

## 2011-12-18 ENCOUNTER — Ambulatory Visit (INDEPENDENT_AMBULATORY_CARE_PROVIDER_SITE_OTHER): Payer: Medicare Other | Admitting: Cardiology

## 2011-12-18 ENCOUNTER — Encounter: Payer: Self-pay | Admitting: Cardiology

## 2011-12-18 VITALS — BP 130/70 | HR 73 | Ht 64.0 in | Wt 205.0 lb

## 2011-12-18 DIAGNOSIS — E785 Hyperlipidemia, unspecified: Secondary | ICD-10-CM

## 2011-12-18 DIAGNOSIS — I2510 Atherosclerotic heart disease of native coronary artery without angina pectoris: Secondary | ICD-10-CM

## 2011-12-18 DIAGNOSIS — I10 Essential (primary) hypertension: Secondary | ICD-10-CM

## 2011-12-18 NOTE — Progress Notes (Signed)
Clinical Summary Ms. Reinhardt is a 71 y.o.female presenting for followup. She was seen in April. She states that she has had some mild chest congestion, no fevers or chills. Reports a tooth infection, now resolved. She has had no angina, no nitroglycerin requirement. Still uses oxygen regularly in followup with Dr. Kriste Basque.  Lipids reviewed by Dr. Kriste Basque in August, LDL 46, HDL 38. ECG today shows sinus rhythm with IVCD, NSST changes, PVC. LAFB. No change in cardiac regimen.  No regular exercise, but keeps her house and functions in her ADLs.   Allergies  Allergen Reactions  . Codeine     vomiting  . Terbutaline Sulfate     REACTION: INTOL to terbutaline    Current Outpatient Prescriptions  Medication Sig Dispense Refill  . acetaminophen (TYLENOL) 500 MG tablet Take 500 mg by mouth every 6 (six) hours as needed. pain      . albuterol (PROVENTIL) (2.5 MG/3ML) 0.083% nebulizer solution Take 3 mLs (2.5 mg total) by nebulization 4 (four) times daily.  75 mL  11  . ALPRAZolam (XANAX) 0.5 MG tablet Take 0.5 mg by mouth 3 (three) times daily.      . Alum & Mag Hydroxide-Simeth (MAGIC MOUTHWASH) SOLN 1 tsp gargle and swallow four times daily as needed for sore throat  120 mL  1  . atenolol (TENORMIN) 50 MG tablet TAKE ONE TABLET BY MOUTH EVERY DAY  30 tablet  5  . BONIVA 150 MG tablet TAKE 1 TABLET BY MOUTH ONCE A MONTH  1 each  11  . budesonide (PULMICORT) 0.25 MG/2ML nebulizer solution Take 2 mLs (0.25 mg total) by nebulization 2 (two) times daily.  60 mL  11  . calcium-vitamin D (OSCAL WITH D) 500-200 MG-UNIT per tablet Take 1 tablet by mouth 2 (two) times daily.        . Cholecalciferol (VITAMIN D3) 1000 UNITS CAPS Take by mouth daily.        Marland Kitchen dextromethorphan-guaiFENesin (MUCINEX DM) 30-600 MG per 12 hr tablet Take 2 tablets by mouth every 12 (twelve) hours.        Marland Kitchen etodolac (LODINE) 400 MG tablet TAKE ONE TABLET BY MOUTH EVERY 12 HOURS  60 tablet  3  . ferrous sulfate 325 (65 FE) MG tablet  Take by mouth daily with breakfast.        . fluticasone (FLONASE) 50 MCG/ACT nasal spray USE TWO SPRAY EACH NOSTRIL TWICE DAILY  16 g  5  . furosemide (LASIX) 40 MG tablet TAKE ONE TABLET BY MOUTH EVERY DAY  30 tablet  6  . gabapentin (NEURONTIN) 100 MG capsule TAKE THREE CAPSULES BY MOUTH AT BEDTIME  100 capsule  3  . insulin NPH-insulin regular (HUMULIN 70/30) (70-30) 100 UNIT/ML injection Inject 30 Units into the skin 2 (two) times daily.      Marland Kitchen ipratropium (ATROVENT) 0.02 % nebulizer solution Take 2.5 mLs (500 mcg total) by nebulization 4 (four) times daily.  75 mL  11  . losartan (COZAAR) 50 MG tablet TAKE ONE TABLET BY MOUTH EVERY DAY  30 tablet  3  . metFORMIN (GLUCOPHAGE) 500 MG tablet TAKE ONE TABLET BY MOUTH TWICE DAILY  60 tablet  6  . nitroGLYCERIN (NITROSTAT) 0.4 MG SL tablet Place 1 tablet (0.4 mg total) under the tongue every 5 (five) minutes as needed. May repeat x3  25 tablet  6  . omeprazole (PRILOSEC) 20 MG capsule TAKE ONE CAPSULE BY MOUTH EVERY DAY 30 MINUTES BEFORE A MEAL  30 capsule  10  . simvastatin (ZOCOR) 80 MG tablet Take 1 tablet (80 mg total) by mouth at bedtime.  30 tablet  11  . traMADol (ULTRAM) 50 MG tablet TAKE ONE TABLET BY MOUTH EVERY 8 HOURS AS NEEDED FOR PAIN  90 tablet  5  . vitamin B-12 (CYANOCOBALAMIN) 500 MCG tablet Take 1,000 mcg by mouth daily.         Past Medical History  Diagnosis Date  . COPD (chronic obstructive pulmonary disease)   . Hyperlipidemia   . Essential hypertension, benign   . Anemia   . Anxiety   . Arthritis   . Coronary atherosclerosis of native coronary artery     PTCA/BMS LAD 2000, PTCA/brachytherapy LAD 2001, LVEF 55%  . Type 2 diabetes mellitus   . Osteoporosis   . Venous insufficiency   . Obesity   . Myocardial infarction     AMI 2000    Social History Ms. Boak reports that she quit smoking about 13 years ago. Her smoking use included Cigarettes. She has a 30 pack-year smoking history. She does not have any  smokeless tobacco history on file. Ms. Kizziah reports that she does not drink alcohol.  Review of Systems No palpitations, falls, syncope. No orthopnea or PND. Stable appetite. No reported bleeding episodes.  Physical Examination Filed Vitals:   12/18/11 0906  BP: 130/70  Pulse: 73   Filed Weights   12/18/11 0906  Weight: 205 lb (92.987 kg)   No acute distress. Wearing oxygen via nasal cannula. HEENT: Conjunctiva and lids normal, oropharynx with moist mucosa.  Neck: Supple, no elevated JVP or bruits.  Lungs: Diminished breath sounds throughout, Nonlabored. No wheezing Cardiac: Regular rate and rhythm, no S3 gallop or rub.  Abdomen: Soft, nontender, bowel sounds present.  Skin: Warm and dry.  Extremities: No pitting edema.    Problem List and Plan   CORONARY ATHEROSCLEROSIS NATIVE CORONARY ARTERY Stable without active angina on medical therapy. ECG reviewed. No changes made today.  Essential hypertension, benign Blood pressure control looks good today.  HYPERLIPIDEMIA Recent lipid numbers reviewed, LDL 46.    Jonelle Sidle, M.D., F.A.C.C.

## 2011-12-18 NOTE — Assessment & Plan Note (Signed)
Blood pressure control looks good today. 

## 2011-12-18 NOTE — Assessment & Plan Note (Signed)
Stable without active angina on medical therapy. ECG reviewed. No changes made today.

## 2011-12-18 NOTE — Patient Instructions (Addendum)
Your physician recommends that you schedule a follow-up appointment in: 6 months  

## 2011-12-18 NOTE — Addendum Note (Signed)
Addended by: Reather Laurence A on: 12/18/2011 04:58 PM   Modules accepted: Orders

## 2011-12-18 NOTE — Assessment & Plan Note (Signed)
Recent lipid numbers reviewed, LDL 46.

## 2011-12-31 ENCOUNTER — Other Ambulatory Visit: Payer: Self-pay | Admitting: Pulmonary Disease

## 2012-01-21 ENCOUNTER — Telehealth: Payer: Self-pay | Admitting: Pulmonary Disease

## 2012-01-21 MED ORDER — AMOXICILLIN-POT CLAVULANATE 875-125 MG PO TABS: 1.0000 | ORAL_TABLET | Freq: Two times a day (BID) | ORAL | Status: DC

## 2012-01-21 NOTE — Telephone Encounter (Signed)
I spoke with pt she c/o cough w/ thick yellow phlem, chills/sweats, nasal congestion, chest tx x this morning. Per pt she is not running any fever/wheezing/no v/n. She is requesting to have something called in. She has not taking anything OTC. Last OV 10/12/11 pending OV 02/26/12. Please advise thanks  [facility] [place] Allergies  Allergen Reactions  . Codeine     vomiting  . Terbutaline Sulfate     REACTION: INTOL to terbutaline

## 2012-01-21 NOTE — Telephone Encounter (Signed)
Per SN--call in augmentin 875 mg  #14  1 po bid , align once daily, mucinex 2 po bid , nasal saline spray every hour while awake. Called and spoke with pt and she is aware of SN recs but asked to have these called to the pharmacy since she will forget.  i called and spoke with the pharmacist and she is aware and will help the pt out with these otc meds.  Nothing further is needed.

## 2012-01-30 ENCOUNTER — Other Ambulatory Visit: Payer: Self-pay | Admitting: Pulmonary Disease

## 2012-02-15 ENCOUNTER — Ambulatory Visit: Payer: Medicare Other | Admitting: Pulmonary Disease

## 2012-02-25 ENCOUNTER — Ambulatory Visit: Payer: Medicare Other | Admitting: Pulmonary Disease

## 2012-02-25 ENCOUNTER — Encounter: Payer: Self-pay | Admitting: *Deleted

## 2012-02-26 ENCOUNTER — Ambulatory Visit (INDEPENDENT_AMBULATORY_CARE_PROVIDER_SITE_OTHER): Payer: Medicare Other | Admitting: Pulmonary Disease

## 2012-02-26 ENCOUNTER — Encounter: Payer: Self-pay | Admitting: Pulmonary Disease

## 2012-02-26 VITALS — BP 126/70 | HR 58 | Temp 99.5°F | Ht 64.5 in | Wt 207.2 lb

## 2012-02-26 DIAGNOSIS — E119 Type 2 diabetes mellitus without complications: Secondary | ICD-10-CM

## 2012-02-26 DIAGNOSIS — E669 Obesity, unspecified: Secondary | ICD-10-CM

## 2012-02-26 DIAGNOSIS — K219 Gastro-esophageal reflux disease without esophagitis: Secondary | ICD-10-CM

## 2012-02-26 DIAGNOSIS — E785 Hyperlipidemia, unspecified: Secondary | ICD-10-CM

## 2012-02-26 DIAGNOSIS — I10 Essential (primary) hypertension: Secondary | ICD-10-CM

## 2012-02-26 DIAGNOSIS — I2510 Atherosclerotic heart disease of native coronary artery without angina pectoris: Secondary | ICD-10-CM

## 2012-02-26 DIAGNOSIS — K5900 Constipation, unspecified: Secondary | ICD-10-CM

## 2012-02-26 DIAGNOSIS — M1990 Unspecified osteoarthritis, unspecified site: Secondary | ICD-10-CM

## 2012-02-26 DIAGNOSIS — F411 Generalized anxiety disorder: Secondary | ICD-10-CM

## 2012-02-26 DIAGNOSIS — M810 Age-related osteoporosis without current pathological fracture: Secondary | ICD-10-CM

## 2012-02-26 DIAGNOSIS — J209 Acute bronchitis, unspecified: Secondary | ICD-10-CM

## 2012-02-26 DIAGNOSIS — I872 Venous insufficiency (chronic) (peripheral): Secondary | ICD-10-CM

## 2012-02-26 MED ORDER — MAGIC MOUTHWASH: ORAL | Status: DC

## 2012-02-26 NOTE — Progress Notes (Signed)
Subjective:    Patient ID: [name], female    DOB: 03-23-1940, 71 y.o.   MRN: [id number]  HPI 71 y/o WF here for a follow up visit... [name] has 13 grandchildren & 18 great-grands!  She also has mult med problems including severe COPD; HBP; CAD w/prev MI & PTCA followed by DrMcDowell; VI; Hyperlipidemia; DM on insulin; Obesity; GERD/ constipation; DJD/ osteoporosis; Anxiety...  ~  April 24, 2010:  465mo ROV- she has only incr insulin marginally to 25-30u AM & 20-25u PM and todays lab shows FBS=236, A1c=8.8> we discussed incr her 70/30 insulin to 35u AM & 30u PM & titrate up from there...     BP is controlled on Aten, Lisin, Lasix> 140/82 today & she needs to check more often at home;  she saw DrMcDowell 9/11> HBP/ CAD/ Lipids all stable & no changes made;  she has lost wt down to 215# from peak 258# in 2010 but no wt loss over the last 465mo & we reviewed diet + exercise perscription;  FLP looks good on Simva80 + Fish Oil & meds tol well so we won't make any changes in her regimen...    She is c/o cough, yellow sput, nasal congestion & sneezing, feeling weak & "woozy" w/ mult somatic complaints;  she saw TP 2/6 & given Avelox, Mucinex, Hydromet but has persistant symptoms; **we discussed sinus regimen w/ Saline nasal mist, Mucinex + Fluids, Flonase, plus a brief PredDosepak (next step= ENT referral)...    She also has right knee pain> known degen arthritis & followed by DrNorris for Ortho- given steroid shot 9/11 w/ some transient improvement;  she uses etodolac Prn & has Omep for stomach;  finally asking for refill alprazolam due to anxiety...  ~  October 04, 2010:  465mo ROV & post hosp visit> she was [facility] 3/15 - 05/26/10 by [facility] w/ COPD exac, GIB from HPylori pos gastritis & ulcers, acute blood loss anemia, chr renal insuffic, etc;  She improved w/ COPD rx, Pred, ACE ch to ARB, Mucinex, etc;  EGD by DrJacobs w/ Ab rx for HPylori & DC NSAIDs, Boniva, etc;  Anemia gradually improved w/ oral Fe therapy;  Renal  is stable w/ Creat 1.2-1.4...    She has mult somatic complaints & doesn't feel well> c/o LBP some radiation into legs; she is off prev ASA & Lodine due to GIB, just using Tylenol & topical rubs for pain;  We discussed trying rest, heat, Tramadol w/ Tylenol & refer to Ortho for further eval...    BP control is fair off the ACE now & she didn't bring med bottles to review compliance; she remains on Simva80 + Fish Oil for her Lipids; she has adjusted her insulin up to 30uAM & 25uPM of her Humulin70/30 & she continues on the Metformin; she has not lost any weight; I indicated to her that if we are to help her control her DM & mult medical problems then she will need to see [place] every 3months but she is reluctant due to the number of specialists that she sees...  ~  February 06, 2011:  29mo ROV & she is c/o some head & chest congestion, cough w/ thick brown sputum, sl moe SOB, denies f/c/s, denies CP, etc;  We reviewed meds> NEBS regularly, Mucinex, Fluids, & she wants ZPak for infection- ok...  She requests refills, declines Flu shot...  Labs today w/ Chol ok but TG 221 & needs better diet; BS 237, A1c 8.0 is actually  sl better on Novolin 70/30 taking 25uBid + Metformin 500Bid (we reviewed recs for slowly increasing dose)...    She saw DrMcDowell for Cards f/u 10/12> stable w/ longstanding CAD, prev PTCA, not having angina, mobility limited, & he rec continued med rx & observation...    She saw DrAplington 8/12 w/ LBP spondylosis & spinal stenosis> given PT, poor operative risk, wt loss will help, trial TENS, continue meds and Neurontin...  ~  June 07, 2011:  441mo ROV & Calliope notes "I'm not doin" c/o depressed (Mother died recently) & angry at [facility]- wants another DME company;  States that she's hurting all over all the time...    She has severe COPD on O2, NEBS w/ AlbutQid & BudesBid, Mucinex, etc; breathing is at baseline DOE w/ ADLs & no real change...    BP controlled on 3 med regimen & she denies CP, palpit,  dizzy/syncope, edema, etc...    Lipids under control w/ Simva80 & tol well; FLP 12/12 was ok x for elev TG & we reviewed low fat diet restriction... LABS 4/13:  Chems- ok x BS=202 A1c=7.9;  CBC- ok x Hg=11.6  ~  October 12, 2011:  441mo ROV & Ayame is c/o head congestion, drainage, cough & thick whitish sput; denies fever, chills, sweats, discolored phlegm or hemoptysis; she has NEBS w/ Duoneb, Pulmicort, Mucinex, Flonase, MMW.; she is very distressed about being "stopped up" & wants ENT assessment so we will refer ==> she saw DrTeoh 9/13> chr nasal congestion, freq sore throats, hoarseness, post nasal drip; she had edematous mucosa, nasal turbinate hypertrophy, but no infection/ poilyps/ mass & latynx was ok; Rx w/ Saline irrigations, continue other meds & add Atrovent nasal 2spBid...    HBP controlled on Aten, Losar, Lasix; BP= 118/70 & she denies CP, palpit, change in SOB or edema...    CAD w/ prev MI & PTCAs folowed by DrMcDowell in [place] on above meds + ASA...    Dyslipidemia on Zocor80 w/ Chol numbers at goals but Tg still elev & she's been instructed on low fat diet as well...    DM treated w/ Metform & 70/30 insulin currently taking 28u Bid...    She remains obese & is disappointed w/ her results on diet; wt= 206# & BMI~35; we reviewed diet, exercise, wt reduction strategies... LABS 8/13:  FLP- chol ok on Simva80 but TG=200;  Chems- ok x BS=157 A1c=7.8 on Metform+Insulin;  CBC- ok x Hg=11.6, Fe=59;  TSH=1.35;  VitD=38  ~  February 26, 2012:  441mo ROV & Asencion is again c/o nasal congestion, thick phlegm in throat, cough w/ clear sput, on meds below; she had CT maxillofacial 9/13 w/ no signif sinus opacity noted, there was a socket left by right mandib wisdom tooth extraction w/ inflamm (tooth extracted 6/13); treated w/ PenVK, Vicodin, rec ret to Dentist...    HxSinusitis> on MMW, Flonase, OTC antihist prn; c.o chr congestion, no recent infection...    COPD> on O2, NEBS w/ Duoneb/ Budes, Mucinex2Bid;  stable & denies ch in cough/ sput/ SOB, etc...    HBP> on Aten50, Losar50, Lasix40; labs 8/13 w/ normal lytes & renal; BP= 126/70 & she denies CP, palpit, ch in SOB, edema, etc...    CAD> on ASA81, NTG prn; followed by DrMcDowell- seen 10/13 & stable, no changes made...    VI> stable on low sodium diet, Lasix40, elevation, support hose...    Hyperlipid> on Simva80; last FLP 8/13 showed TChol 125, TG 200, HDL 39, LDL 46  DM> on 70/30 insulin 30Bid, Metform500Bid; doing well she says w/ BS=101 today at home; labs here 8/13 showed BS=157, A1c=7.8    Obesity> wt is 207# which is up 2# from last visit; we reviewed diet, exercise, wt reduction strategies...    GI- GERD, Constip> on Omep20; hx gastritis & prepyloric ulcers on EGD 3/12, rec to stay on the PPI Rx; last colon 2007 by DrJacobs was neg...    DJD> on Lodine400Bid prn, Tramadol50Tid prn, folowed by [facility]...    Osteoporosis> on Boniva150, clacium, MVI, VitD1000; labs 8/13 showed VitD=38; last BMD 5/13 w/ TScore -1.6 in hips...    Neuropathy> on Neurontin100Tid...    Anxiety> on Xanax0.5mg  prn...    Borderline Anemia> on Fe, B12 1032mcg/d; labs 8/13 showed Hg=11.6, MCV=93, Fe=59 (18%sat),  We reviewed prob list, meds, xrays and labs> see below for updates >>           Problem List:  MAXILLARY SINUSITIS (ICD-461.0)  - hx sinusitis symptoms 3/09 w/ CTSinus neg for acute changes... Rx'd w/ MUCINEX, Nasal Saline, FLONASE...   ~  2/11:  c/o sinusitis & AUGMENTIN perscribed. ~  9/13:  ER visit w/ jaw pain- had wisdom tooth extracted 6/13 by DrLewis in [place]; CT showed socket left by right mandib molar extraction w/ inflamm; given PenVK, Vicodin, told f/u w/ dentist...  COPD (ICD-496) - she has COPD and is an ex-smoker... controlled on NEBULIZER w/ Duoneb Qid + Budesonide Bid, MUCINEX, etc... she has end stage disease w/ severe dyspnea w/ ADLs and no change over the last yr... ~  baseline CXR = COPD, incr heart size, NAD. ~ CT Chest  2/02 showed COPD, Atx, sl scarring, NAD. ~  bronchoscopy 3/02 w/ mucous plugging, no endobr lesions. ~  PFT's in 12/07 showed FVC=0.81 (27%), FEV1=0.54 (22%), %1sec=67, mid-flows=13%pred. ~  CXR 1/10 showed chr changes, cardiomeg, lingular atx. ~  CXR 2/11 showed cardiomeg, bibasilar scarring, NAD. ~  CXR 2/12 showed cardiomeg, COPD,scarring left base, NAD.  HYPERTENSION (ICD-401.9) - controlled on ATENOLOL 50mg /d, LOSARTAN 50mg /d, LASIX 40mg /d... BP=126/70 and tol well... denies HA, visual changes, CP, palipit, dizziness, syncope, change in dyspnea, edema, etc. ~  Jan10: tried to change ACE to Diovan but too $$$ & requested change back to Lisinopril==> eventually changed to generic LOSARTAN...  CORONARY ARTERY DISEASE (ICD-414.00) - on ASA 81mg /d... hx of CAD w/ prev MI in 2000, and subseq PTCA's in 2000, & 2001... ~  last cath 2001 w/ PTCA to mid-LAD lesion w/ cutting balloon & brachytherapy for in-stent restenosis. ~  last hosp 6/08 w/ CP- felt to be non-cardiac... ~  last 2DECho 6/08 showed HK of inferoseptal area, EF=55%... ~  last NuclearStressTest 7/08 w/ mod ant/apical/septal infarct, apical HK, no ischemia, EF=?53%... ~  followed by [facility]McDowell at [facility] office- notes reviewed... ~  4/13: Note from DrMcDowell> compliant w/ meds but not exercising, unsuccessful w/ wt loss, CAD/ BP/ Lipids- stable, no changes made... ~  1013: she saw DrMcDowell- mild chest congestion, tooth infection, on O2, not exercising enough, no CP etc; no changes made...  ~  EKG 10/13 showed NSR, rate74, IVCD, poor R prog V1-3, NSSTTWA...  VENOUS INSUFFICIENCY (ICD-459.81) - low sodium diet, elevation, & Lasix 40-80mg /d... states she "allergic" to TED hose! and refuses to wear them...   HYPERLIPIDEMIA (ICD-272.4) - on ZOCOR 80mg /d + FISH OIL 1000mg /d ... she has been counselled on diet & exercise but unsuccessful... ~  FLP 11/08 on Simva40 showed TChol 150, TG 185, HDL 35, LDL 78 ~  FLP 3/09 on Simva40 showed  TChol 139, TG 193, HDL 30, LDL 70... DrMcDowell incr to Simva80 9/09. ~  FLP 2/10 on Simva80 showed TChol 155, TG 137, HDL 43, LDL 85 ~  FLP 10/10 showed TChol 129, TG 96, HDL 39, LDL 71 ~  FLP 8/11 showed TChol 130, TG 152, HDL 30, LDL 70 ~  FLP 2/12 showed TChol 139, TG 96, HDL 40, LDL 80 ~  FLP 12/12 on Simva80 showed TChol 154, TG 221, HDL 39, LDL 81... LFTs remain wnl; rec> better low fat diet... ~  FLP 8/13 on Simva80 showed TChol 125, TG 200, HDL 39, LDL 46  DM (ICD-250.00) - on HUMULIN 70/30- now taking 25uAM & 25uPM she says & GLUCOPHAGE 500mg Bid (off prev Avandia)... ~  BS=142 & HgA1c=6.6 in Nov08... ~  Labs 3/09 showed BS= 112, HgA1c= 6.8 ~  labs 7/09 showed BS= 197, HgA1c= 7.1.Marland KitchenMarland Kitchen rec- better diet! ~  labs 2/10 showed BS= 134, A1c= 7.0 ~  labs 10/10 showed BS= 151, A1c= 6.9 ~  labs 2/11 showed BS= 239, A1c= 8.1.Marland KitchenMarland Kitchen pt instructed to up titrate insulin til BS= 120-150 range. ~  labs 8/11 showed BS= 215, A1c= 8.4... again instructed to incr insulin slowly & titrate to BS values. ~  labs 2/12 showed BS= 236, A1c= 8.8.Marland KitchenMarland Kitchen rec to incr 70/30 insulin incrementally... ~  Labs 8/12 showed BS= 199, A1c= 9.2.Marland KitchenMarland Kitchen rec slowly incr her 70/30 insulin doses as discussed. ~  Labs 12/12 on 70/30Insulin25uBid+Metform500Bid showed BS= 237, A1c= 8.0..Marland Kitchen We discussed incr to 30uAM & 30uPM... ~  Labs 8/13 on 70/30Insulin28uBid+Metform500Bid showed BS= 157, A1c= 7.8.Marland KitchenMarland Kitchen rec incr 70/30 insulin to 30uBid...  OBESITY (ICD-278.00) - we discussed diet + exercise program required to lose weight! ~  weight 2008 = 235# ~  weight 2/10 = 250# ~  weight 10/10 = 258# ~  weight 2/11 = 241# ~  weight 8/11 = 217# ~  weight 2/12 = 215# ~  Weight 8/12 = 214# ~  Weight 12/12 = 209# ~  Weight 8/13 = 206# ~  Weight 12/13 = 207#  GERD (ICD-530.81) - on PPI therapy (changed 2/10 per request to OMEPRAZOLE20mg /d)... ~  [facility] 3/12 for COPD exac & was anemic w/ heme pos stool & GIB; EGD by DrJacobs w/ gastritis & prepyloric  ulcers, +HPylori- treated & improved.  CONSTIPATION (ICD-564.00) - last colonoscopy was 11/07 by DrJacobs and negative...  DEGENERATIVE JOINT DISEASE (ICD-715.90) - prev CSpine films w/ spondy and biforaminal narrowing... LSSpine films w/ DDD and osteopenia...  ~  she saw DrDrake, Ortho in [place], for shot in right foot. ~  9/11:  she saw DrNorris for shot in right knee. ~  8/12: presents c/o LBP, leg pain; XRay Lumbar spine w/ DDD, narrowing, osteophytes, facet dis, etc; Rx w/ rest, heat, TRAMADOL, ROBAXIN, refer to Ortho. ~  She has followed up w/ DrApplington> not a surg candidate for her lumbar spondylosis & sp stenosis...  OSTEOPOROSIS (ICD-733.00) - on BONIVA 150mg /mo, Calcium, Vits... she needs a BMD... ~  labs 2/11 showed Vit D level = 27... OK to change to 1000 u daily. ~  labs 2/12 showed Vit D level = 37... rec to continue 1000u supplement ~  BMD 5/13 showed TScore -1.6 in hips & rec to continue Boniva, Calcium, MVI, VitD... ~  Labs 8/13 showed Vit D level = 38... Continue supplement.  ANXIETY (ICD-300.00) - uses Alprazolam 0.5mg  as needed...  ANEMIA, MILD (ICD-285.9) ~  labs 2/10 showed Hg= 11.9, Fe= 51... ~  labs 10/10 showed Hg= 11.9, Fe= 50... rec OTC Fe supplement. ~  labs 2/11 showed Hg= 13.4, Fe= 52 (14%sat) ~  labs 2/12 showed Hg= 13.1, MCV= 93 ~  Labs 8/12 showed Hg= 12.0, Fe= 61 (15%sat); rec to continue oral iron... ~  Labs 8/13 showed Hg= 11.6, Fe= 59; rec to continue oral iron supplement.  Health Maintenance - she hasn't seen GYN in yrs and is asked to re-establish... prev refused Mammograms due to the discomfort & bruising- she will discuss w/ Gyn & get a follow up BMD as well...   Past Surgical History  Procedure Date  . Total abdominal hysterectomy   . Cholecystectomy   . Rotator cuff repair     Outpatient Encounter Prescriptions as of 02/26/2012  Medication Sig Dispense Refill  . acetaminophen (TYLENOL) 500 MG tablet Take 500 mg by mouth every 6 (six)  hours as needed. pain      . albuterol (PROVENTIL) (2.5 MG/3ML) 0.083% nebulizer solution Take 3 mLs (2.5 mg total) by nebulization 4 (four) times daily.  75 mL  11  . ALPRAZolam (XANAX) 0.5 MG tablet Take 0.5 mg by mouth 3 (three) times daily.      . Alum & Mag Hydroxide-Simeth (MAGIC MOUTHWASH) SOLN 1 tsp gargle and swallow four times daily as needed for sore throat  120 mL  1  . atenolol (TENORMIN) 50 MG tablet TAKE ONE TABLET BY MOUTH EVERY DAY  30 tablet  5  . budesonide (PULMICORT) 0.25 MG/2ML nebulizer solution Take 2 mLs (0.25 mg total) by nebulization 2 (two) times daily.  60 mL  11  . calcium-vitamin D (OSCAL WITH D) 500-200 MG-UNIT per tablet Take 1 tablet by mouth 2 (two) times daily.        . Cholecalciferol (VITAMIN D3) 1000 UNITS CAPS Take by mouth daily.        Marland Kitchen dextromethorphan-guaiFENesin (MUCINEX DM) 30-600 MG per 12 hr tablet Take 2 tablets by mouth every 12 (twelve) hours.        Marland Kitchen etodolac (LODINE) 400 MG tablet TAKE ONE TABLET BY MOUTH EVERY 12 HOURS  60 tablet  3  . ferrous sulfate 325 (65 FE) MG tablet Take by mouth daily with breakfast.        . fluticasone (FLONASE) 50 MCG/ACT nasal spray USE TWO SPRAY EACH NOSTRIL TWICE DAILY  16 g  5  . furosemide (LASIX) 40 MG tablet TAKE ONE TABLET BY MOUTH EVERY DAY  30 tablet  6  . gabapentin (NEURONTIN) 100 MG capsule TAKE THREE CAPSULES BY MOUTH AT BEDTIME  100 capsule  0  . ibandronate (BONIVA) 150 MG tablet TAKE 1 TABLET BY MOUTH ONCE A MONTH  1 tablet  11  . insulin NPH-insulin regular (HUMULIN 70/30) (70-30) 100 UNIT/ML injection Inject 30 Units into the skin 2 (two) times daily.      Marland Kitchen ipratropium (ATROVENT) 0.02 % nebulizer solution Take 2.5 mLs (500 mcg total) by nebulization 4 (four) times daily.  75 mL  11  . losartan (COZAAR) 50 MG tablet TAKE ONE TABLET BY MOUTH EVERY DAY  30 tablet  3  . metFORMIN (GLUCOPHAGE) 500 MG tablet TAKE ONE TABLET BY MOUTH TWICE DAILY  60 tablet  6  . nitroGLYCERIN (NITROSTAT) 0.4 MG SL  tablet Place 1 tablet (0.4 mg total) under the tongue every 5 (five) minutes as needed. May repeat x3  25 tablet  6  . omeprazole (PRILOSEC) 20 MG capsule TAKE ONE CAPSULE BY MOUTH EVERY DAY 30  MINUTES BEFORE A MEAL  30 capsule  10  . simvastatin (ZOCOR) 80 MG tablet TAKE ONE TABLET BY MOUTH AT BEDTIME  30 tablet  0  . traMADol (ULTRAM) 50 MG tablet TAKE ONE TABLET BY MOUTH EVERY 8 HOURS AS NEEDED FOR PAIN  90 tablet  5  . vitamin B-12 (CYANOCOBALAMIN) 500 MCG tablet Take 1,000 mcg by mouth daily.       . [DISCONTINUED] amoxicillin-clavulanate (AUGMENTIN) 875-125 MG per tablet Take 1 tablet by mouth 2 (two) times daily.  14 tablet  0     Allergies  Allergen Reactions  . Codeine     vomiting  . Terbutaline Sulfate     REACTION: INTOL to terbutaline    Current Medications, Allergies, Past Medical History, Past Surgical History, Family History, and Social History were reviewed in [employer] record.    Review of Systems         See HPI - all other systems neg except as noted...   The patient complains of dyspnea on exertion, peripheral edema, muscle weakness, and difficulty walking.  The patient denies anorexia, fever, weight loss, weight gain, vision loss, decreased hearing, hoarseness, chest pain, syncope, prolonged cough, headaches, hemoptysis, abdominal pain, melena, hematochezia, severe indigestion/heartburn, hematuria, incontinence, suspicious skin lesions, transient blindness, depression, unusual weight change, abnormal bleeding, enlarged lymph nodes, and angioedema.    Objective:   Physical Exam    WD, Obese, 71 y/o WF in NAD... GENERAL:  Alert & oriented; pleasant & cooperative...  HEENT:  [place]/AT, EOM-wnl, PERRLA, EACs-clear, TMs-wnl, NOSE-clear, THROAT-clear & wnl. NECK:  Supple w/ fairROM; no JVD; normal carotid impulses w/o bruits; no thyromegaly or nodules palpated; no lymphadenopathy. CHEST:  Decr BS bilat, few scat rhonchi, without wheezing, rales, or  rubs...upper airway psuedo-wheezing HEART:  Regular Rhythm;  gr 1/6 SEM without rubs or gallops detected... ABDOMEN:  Obese w/ panniculus, soft & nontender; normal bowel sounds; no organomegaly or masses detected. EXT: without deformities, mild arthritic changes; no varicose veins/ +venous insuffic/ tr edema. Sl tender on palpation over lumbar area & siatic notch... NEURO:  CN's intact; no focal neuro deficits... DERM:  no lesion seen...  RADIOLOGY DATA:  Reviewed in the EPIC EMR & discussed w/ the patient...  LABORATORY DATA:  Reviewed in the EPIC EMR & discussed w/ the patient...   Assessment & Plan:    COPD>  Severe, continue home Oxygen, Nebulizer, Mucinex, & Fluids;  She will also continue vigorous nasal regimen as discussed...  HBP>  Good control on Atenolol50, Losartan50, Furosemide40; discussed diet, must get wt down, no sodium...  CAD>  Followed by DrMcDowell in [place] office, no angina, stable clinically, continue medical management...  HYPERLIPID>  On Simva80 + Fish Oil; needs better diet & get wt down!  DM>  Control remains poor as she has been unable to diet effectively, lose weight, or incrementally increase her insulin properly to get sugars under control;  Again asked to slowly inc her 70/30 doses by 1-2 u increments until she is taking 30uBid.  OBESITY>  Weight reduction is key to improvement in all areas...  GI> GERD, Ulcers, +HPylori, Constip>  Improved after HPylori Rx & back on Omep 20mg /d...  DJD, LBP, etc>  Lumbar XRay reviewed, trial Tramadol, Robaxin, rest/ heat/ etc... Refer to Ortho for further eval & Rx...  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  Anxiety>  Alprazolam for prn use- refilled...  Anemia>  On Fe daily  and Hg is improved (s/p hosp w/ GIB)...   Patient's Medications  New Prescriptions   No medications on file  Previous Medications   ACETAMINOPHEN (TYLENOL) 500 MG TABLET    Take 500 mg by  mouth every 6 (six) hours as needed. pain   ALBUTEROL (PROVENTIL) (2.5 MG/3ML) 0.083% NEBULIZER SOLUTION    Take 3 mLs (2.5 mg total) by nebulization 4 (four) times daily.   ALPRAZOLAM (XANAX) 0.5 MG TABLET    Take 0.5 mg by mouth 3 (three) times daily.   ATENOLOL (TENORMIN) 50 MG TABLET    TAKE ONE TABLET BY MOUTH EVERY DAY   BUDESONIDE (PULMICORT) 0.25 MG/2ML NEBULIZER SOLUTION    Take 2 mLs (0.25 mg total) by nebulization 2 (two) times daily.   CALCIUM-VITAMIN D (OSCAL WITH D) 500-200 MG-UNIT PER TABLET    Take 1 tablet by mouth 2 (two) times daily.     CHOLECALCIFEROL (VITAMIN D3) 1000 UNITS CAPS    Take by mouth daily.     DEXTROMETHORPHAN-GUAIFENESIN (MUCINEX DM) 30-600 MG PER 12 HR TABLET    Take 2 tablets by mouth every 12 (twelve) hours.     ETODOLAC (LODINE) 400 MG TABLET    TAKE ONE TABLET BY MOUTH EVERY 12 HOURS   FERROUS SULFATE 325 (65 FE) MG TABLET    Take by mouth daily with breakfast.     FLUTICASONE (FLONASE) 50 MCG/ACT NASAL SPRAY    USE TWO SPRAY EACH NOSTRIL TWICE DAILY   FUROSEMIDE (LASIX) 40 MG TABLET    TAKE ONE TABLET BY MOUTH EVERY DAY   GABAPENTIN (NEURONTIN) 100 MG CAPSULE    TAKE THREE CAPSULES BY MOUTH AT BEDTIME   IBANDRONATE (BONIVA) 150 MG TABLET    TAKE 1 TABLET BY MOUTH ONCE A MONTH   INSULIN NPH-INSULIN REGULAR (HUMULIN 70/30) (70-30) 100 UNIT/ML INJECTION    Inject 30 Units into the skin 2 (two) times daily.   IPRATROPIUM (ATROVENT) 0.02 % NEBULIZER SOLUTION    Take 2.5 mLs (500 mcg total) by nebulization 4 (four) times daily.   LOSARTAN (COZAAR) 50 MG TABLET    TAKE ONE TABLET BY MOUTH EVERY DAY   METFORMIN (GLUCOPHAGE) 500 MG TABLET    TAKE ONE TABLET BY MOUTH TWICE DAILY   NITROGLYCERIN (NITROSTAT) 0.4 MG SL TABLET    Place 1 tablet (0.4 mg total) under the tongue every 5 (five) minutes as needed. May repeat x3   OMEPRAZOLE (PRILOSEC) 20 MG CAPSULE    TAKE ONE CAPSULE BY MOUTH EVERY DAY 30 MINUTES BEFORE A MEAL   SIMVASTATIN (ZOCOR) 80 MG TABLET    TAKE ONE  TABLET BY MOUTH AT BEDTIME   TRAMADOL (ULTRAM) 50 MG TABLET    TAKE ONE TABLET BY MOUTH EVERY 8 HOURS AS NEEDED FOR PAIN   VITAMIN B-12 (CYANOCOBALAMIN) 500 MCG TABLET    Take 1,000 mcg by mouth daily.   Modified Medications   Modified Medication Previous Medication   ALUM & MAG HYDROXIDE-SIMETH (MAGIC MOUTHWASH) SOLN Alum & Mag Hydroxide-Simeth (MAGIC MOUTHWASH) SOLN      1 tsp gargle and swallow four times daily as needed for sore throat    1 tsp gargle and swallow four times daily as needed for sore throat  Discontinued Medications   AMOXICILLIN-CLAVULANATE (AUGMENTIN) 875-125 MG PER TABLET    Take 1 tablet by mouth 2 (two) times daily.

## 2012-02-26 NOTE — Patient Instructions (Addendum)
Today we updated your med list in our EPIC system...    Continue your current medications the same...    We refilled your Magic Mouthwash per request...  Keep up the good work w/ diet & exercise...  Call for any questions or if we can be of service in any way...  Let's plan a follow up visit w/ FASTING blood work & CXR in the spring.Marland KitchenMarland Kitchen

## 2012-03-06 ENCOUNTER — Telehealth: Payer: Self-pay | Admitting: Pulmonary Disease

## 2012-03-06 NOTE — Telephone Encounter (Signed)
Spoke with pharmacist and advised okay to give regular [facility] MMW Nothing further needed

## 2012-03-12 ENCOUNTER — Other Ambulatory Visit: Payer: Self-pay | Admitting: Pulmonary Disease

## 2012-03-12 MED ORDER — GABAPENTIN 100 MG PO CAPS: ORAL_CAPSULE | ORAL | Status: DC

## 2012-03-13 ENCOUNTER — Other Ambulatory Visit: Payer: Self-pay | Admitting: Pulmonary Disease

## 2012-03-24 ENCOUNTER — Telehealth: Payer: Self-pay | Admitting: Pulmonary Disease

## 2012-03-24 NOTE — Telephone Encounter (Signed)
I spoke with pt and she was calling and letting us know [facility] was going to call us for test strips refill. This is just FYI for [place]. nothing further was needed

## 2012-03-31 ENCOUNTER — Other Ambulatory Visit: Payer: Self-pay | Admitting: Pulmonary Disease

## 2012-04-02 ENCOUNTER — Other Ambulatory Visit: Payer: Self-pay | Admitting: Pulmonary Disease

## 2012-05-06 ENCOUNTER — Telehealth: Payer: Self-pay | Admitting: Pulmonary Disease

## 2012-05-06 MED ORDER — METHYLPREDNISOLONE 4 MG PO KIT: PACK | ORAL | Status: DC

## 2012-05-06 MED ORDER — MOXIFLOXACIN HCL 400 MG PO TABS: 400.0000 mg | ORAL_TABLET | Freq: Every day | ORAL | Status: DC

## 2012-05-06 NOTE — Telephone Encounter (Signed)
Spoke to pt - she states that she started coughing yesterday. When she woke up this morning she could barely breath. Did a breathing tx with minimal relief. Cough is producing dark green mucus. Denies fever/chills. She is wanting something to help with her cough.  Please advise, thanks.

## 2012-05-06 NOTE — Telephone Encounter (Signed)
Called the pharmacy and they are aware that the avelox is generic now.  They will try to run this through again for the pt.

## 2012-05-06 NOTE — Telephone Encounter (Signed)
Per SN---  If pt can barely breath then she will need to go to the ER.   If just nasal congestion, cough and drainage--- Then ok to call in   avelox 400 mg #7  1 daily Medrol dosepak  Take as directed mucinex 2 po bid with plenty of fluids Align once daily  Called and spoke with pt and she stated that she would like the abx called to the pharmacy.  She stated that her breathing is fine now and thinks that if she starts on this she will be ok.  i advised her if she gets worse then she will need to seek medical care. Pt voiced her understanding

## 2012-05-16 ENCOUNTER — Telehealth: Payer: Self-pay | Admitting: Pulmonary Disease

## 2012-05-16 ENCOUNTER — Other Ambulatory Visit: Payer: Self-pay | Admitting: Pulmonary Disease

## 2012-05-16 MED ORDER — ATENOLOL 50 MG PO TABS: 50.0000 mg | ORAL_TABLET | Freq: Every day | ORAL | Status: DC

## 2012-05-16 NOTE — Telephone Encounter (Signed)
Refill of the atenolol has been sent in to the pharmacy for the pt and the pt is aware.

## 2012-05-16 NOTE — Telephone Encounter (Signed)
ATC patient to verify msg. No answer, LMOMTCB

## 2012-05-19 NOTE — Telephone Encounter (Signed)
Spoke with Darl Pikes at [facility] diabetic supplies [phone])  to verify that they have been supplying pt with diabetic supplies.  They have been getting supplies for pt since 2010.  And pt still has a current rx for supplies.  PT informed that supplies are being sent.

## 2012-05-26 ENCOUNTER — Telehealth: Payer: Self-pay | Admitting: Pulmonary Disease

## 2012-05-26 NOTE — Telephone Encounter (Signed)
Called and spoke with pt and she stated that she has not received any diabetic supplies since December.  She stated that she called the med- care today and they told her that the last time they shipped her supplies out was in dec and then hung up on her.  i called and they stated that they will need progress notes faxed to them before they can release any supplies to the pt.  Their fax number is [phone].  SN please advise if ok to send the last ov note.  thanks

## 2012-05-27 NOTE — Telephone Encounter (Signed)
Called and spoke with [facility] pharmacy for the pt.   They requested that an rx for the diabetic supplies be faxed to their pharmacy to see if this will go through with the pts insurance. This has been filled out and placed on SN cart to be signed.

## 2012-05-28 NOTE — Telephone Encounter (Signed)
Pt returned call. She says she went by the pharmacy this afternoon and they are now aware of the size needles she needs. Nothing further needed per pt and "please thank Leigh for her". [name]

## 2012-05-28 NOTE — Telephone Encounter (Signed)
Called and spoke with pharmacy and they have ordered her new meter for the pt and they are waiting on the pt to come in and let them know what size needles she will need.  Called and lmom for the pt to call me back.

## 2012-06-08 ENCOUNTER — Other Ambulatory Visit: Payer: Self-pay | Admitting: Pulmonary Disease

## 2012-06-16 ENCOUNTER — Other Ambulatory Visit: Payer: Self-pay | Admitting: Pulmonary Disease

## 2012-06-18 ENCOUNTER — Ambulatory Visit (INDEPENDENT_AMBULATORY_CARE_PROVIDER_SITE_OTHER): Payer: Medicare Other | Admitting: Cardiology

## 2012-06-18 ENCOUNTER — Encounter: Payer: Self-pay | Admitting: Cardiology

## 2012-06-18 ENCOUNTER — Ambulatory Visit: Payer: Medicare Other | Admitting: Cardiology

## 2012-06-18 ENCOUNTER — Encounter: Payer: Self-pay | Admitting: *Deleted

## 2012-06-18 VITALS — BP 136/80 | HR 66 | Wt 206.1 lb

## 2012-06-18 DIAGNOSIS — I25810 Atherosclerosis of coronary artery bypass graft(s) without angina pectoris: Secondary | ICD-10-CM

## 2012-06-18 DIAGNOSIS — J449 Chronic obstructive pulmonary disease, unspecified: Secondary | ICD-10-CM

## 2012-06-18 DIAGNOSIS — E785 Hyperlipidemia, unspecified: Secondary | ICD-10-CM

## 2012-06-18 DIAGNOSIS — I10 Essential (primary) hypertension: Secondary | ICD-10-CM

## 2012-06-18 DIAGNOSIS — I2510 Atherosclerotic heart disease of native coronary artery without angina pectoris: Secondary | ICD-10-CM

## 2012-06-18 NOTE — Assessment & Plan Note (Signed)
Oxygen dependent, followed by Dr. Kriste Basque.

## 2012-06-18 NOTE — Assessment & Plan Note (Signed)
Continues on statin therapy, LDL 45 in August 2013.

## 2012-06-18 NOTE — Assessment & Plan Note (Signed)
No change in current antihypertensive regimen. 

## 2012-06-18 NOTE — Patient Instructions (Addendum)
Your physician recommends that you schedule a follow-up appointment in:  6 months  Your physician has requested that you have a lexiscan myoview. For further information please visit [email or web address] Please follow instruction sheet, as given.

## 2012-06-18 NOTE — Progress Notes (Signed)
Clinical Summary [name] is a 72 y.o.female last seen in October 2013. She continues to follow with Dr. Kriste Basque for routine care. She tells me that she has been experiencing intermittent exertional chest discomfort consistent with angina, has had to start using nitroglycerin more frequently. This has been occurring over the last few months. She reports compliance with her medications.  Lipids from August 2013 reviewed with HDL 38, LDL 45, cholesterol 125, triglycerides 200. No specific change in her statin profile. ECG today shows sinus rhythm with left axis, LVH with poor R wave progression.  She has not had any recent ischemic testing. Cardiac history is noted below, last PCI with brachytherapy of the LAD was in 2001, she was following with Dr. Chales Abrahams.  She also tells her that she will be having cataract surgery later this month.  Allergies  Allergen Reactions  . Codeine     vomiting  . Terbutaline Sulfate     REACTION: INTOL to terbutaline    Current Outpatient Prescriptions  Medication Sig Dispense Refill  . acetaminophen (TYLENOL) 500 MG tablet Take 500 mg by mouth every 6 (six) hours as needed. pain      . albuterol (PROVENTIL) (2.5 MG/3ML) 0.083% nebulizer solution Take 3 mLs (2.5 mg total) by nebulization 4 (four) times daily.  75 mL  11  . ALPRAZolam (XANAX) 0.5 MG tablet TAKE ONE-HALF TO ONE TABLET BY MOUTH THREE TIMES DAILY AS NEEDED  90 tablet  5  . Alum & Mag Hydroxide-Simeth (MAGIC MOUTHWASH) SOLN 1 tsp gargle and swallow four times daily as needed for sore throat  120 mL  5  . atenolol (TENORMIN) 50 MG tablet Take 1 tablet (50 mg total) by mouth daily.  30 tablet  11  . budesonide (PULMICORT) 0.25 MG/2ML nebulizer solution Take 2 mLs (0.25 mg total) by nebulization 2 (two) times daily.  60 mL  11  . calcium-vitamin D (OSCAL WITH D) 500-200 MG-UNIT per tablet Take 1 tablet by mouth 2 (two) times daily.        . Cholecalciferol (VITAMIN D3) 1000 UNITS CAPS Take by mouth  daily.        Marland Kitchen dextromethorphan-guaiFENesin (MUCINEX DM) 30-600 MG per 12 hr tablet Take 2 tablets by mouth every 12 (twelve) hours.        Marland Kitchen etodolac (LODINE) 400 MG tablet TAKE ONE TABLET BY MOUTH EVERY 12 HOURS  60 tablet  3  . ferrous sulfate 325 (65 FE) MG tablet Take by mouth daily with breakfast.        . fluticasone (FLONASE) 50 MCG/ACT nasal spray USE TWO SPRAY EACH NOSTRIL TWICE DAILY  16 g  5  . furosemide (LASIX) 40 MG tablet TAKE ONE TABLET BY MOUTH EVERY DAY  30 tablet  5  . gabapentin (NEURONTIN) 100 MG capsule Take 3 capsules by mouth at bedtime  100 capsule  6  . ibandronate (BONIVA) 150 MG tablet TAKE 1 TABLET BY MOUTH ONCE A MONTH  1 tablet  11  . insulin NPH-insulin regular (HUMULIN 70/30) (70-30) 100 UNIT/ML injection Inject 30 Units into the skin 2 (two) times daily.      Marland Kitchen losartan (COZAAR) 50 MG tablet TAKE ONE TABLET BY MOUTH EVERY DAY  30 tablet  6  . metFORMIN (GLUCOPHAGE) 500 MG tablet TAKE ONE TABLET BY MOUTH TWICE DAILY  60 tablet  5  . methylPREDNISolone (MEDROL, PAK,) 4 MG tablet follow package directions  21 tablet  0  . moxifloxacin (AVELOX) 400 MG  tablet Take 1 tablet (400 mg total) by mouth daily.  7 tablet  0  . nitroGLYCERIN (NITROSTAT) 0.4 MG SL tablet Place 1 tablet (0.4 mg total) under the tongue every 5 (five) minutes as needed. May repeat x3  25 tablet  6  . omeprazole (PRILOSEC) 20 MG capsule TAKE ONE CAPSULE BY MOUTH EVERY DAY 30 MINUTES BEFORE A MEAL  30 capsule  10  . Probiotic Product (PROBIOTIC DAILY PO) Take by mouth.      . simvastatin (ZOCOR) 80 MG tablet TAKE ONE TABLET BY MOUTH AT BEDTIME  30 tablet  6  . traMADol (ULTRAM) 50 MG tablet TAKE ONE TABLET BY MOUTH EVERY 8 HOURS AS NEEDED FOR PAIN  90 tablet  0  . vitamin B-12 (CYANOCOBALAMIN) 500 MCG tablet Take 1,000 mcg by mouth daily.       Marland Kitchen ipratropium (ATROVENT) 0.02 % nebulizer solution Take 2.5 mLs (500 mcg total) by nebulization 4 (four) times daily.  75 mL  11   No current  facility-administered medications for this visit.    Past Medical History  Diagnosis Date  . COPD (chronic obstructive pulmonary disease)   . Hyperlipidemia   . Essential hypertension, benign   . Anemia   . Anxiety   . Arthritis   . Coronary atherosclerosis of native coronary artery     PTCA/BMS LAD 2000, PTCA/brachytherapy LAD 2001, LVEF 55%  . Type 2 diabetes mellitus   . Osteoporosis   . Venous insufficiency   . Obesity   . Myocardial infarction     AMI 2000    Social History [name] reports that she quit smoking about 14 years ago. Her smoking use included Cigarettes. She has a 30 pack-year smoking history. She does not have any smokeless tobacco history on file. [name] reports that she does not drink alcohol.  Review of Systems No palpitations or syncope. Stable dyspnea on exertion. She continues to wear supplemental oxygen. No orthopnea or PND. No reported bleeding episodes. Otherwise negative.  Physical Examination Filed Vitals:   06/18/12 0841  BP: 136/80  Pulse: 66   Filed Weights   06/18/12 0841  Weight: 206 lb 1.9 oz (93.495 kg)    No acute distress. Wearing oxygen via nasal cannula.  HEENT: Conjunctiva and lids normal, oropharynx with moist mucosa.  Neck: Supple, no elevated JVP or bruits.  Lungs: Diminished breath sounds throughout, Nonlabored. No wheezing  Cardiac: Regular rate and rhythm with occasional ectopic beat, no S3 gallop or rub.  Abdomen: Soft, nontender, bowel sounds present.  Skin: Warm and dry.  Extremities: No pitting edema. Musculoskeletal: No kyphosis. Neuropsychiatric: Alert and oriented x3, affect appropriate.   Problem List and Plan   CORONARY ATHEROSCLEROSIS NATIVE CORONARY ARTERY She is reporting more frequent angina symptoms and nitroglycerin use within the last few months. ECG reviewed, chronically abnormal without acute ST segment change. She is on reasonable cardiac medical regimen and reports compliance. Plan is to  evaluate with a Lexiscan Myoview to assess ischemic burden and determine the next step.  Essential hypertension, benign No change in current antihypertensive regimen.  HYPERLIPIDEMIA Continues on statin therapy, LDL 45 in August 2013.  COPD Oxygen dependent, followed by Dr. Kriste Basque.    Jonelle Sidle, M.D., F.A.C.C.

## 2012-06-18 NOTE — Assessment & Plan Note (Signed)
She is reporting more frequent angina symptoms and nitroglycerin use within the last few months. ECG reviewed, chronically abnormal without acute ST segment change. She is on reasonable cardiac medical regimen and reports compliance. Plan is to evaluate with a Lexiscan Myoview to assess ischemic burden and determine the next step.

## 2012-06-25 ENCOUNTER — Encounter (HOSPITAL_COMMUNITY): Payer: Self-pay | Admitting: Cardiology

## 2012-06-25 ENCOUNTER — Encounter (HOSPITAL_COMMUNITY)
Admission: RE | Admit: 2012-06-25 | Discharge: 2012-06-25 | Disposition: A | Discharge status: Home / Self Care | Payer: Medicare Other | Source: Ambulatory Visit | Attending: Cardiology | Admitting: Cardiology

## 2012-06-25 ENCOUNTER — Encounter (HOSPITAL_COMMUNITY): Payer: Self-pay

## 2012-06-25 ENCOUNTER — Ambulatory Visit (HOSPITAL_COMMUNITY)
Admission: RE | Admit: 2012-06-25 | Discharge: 2012-06-25 | Disposition: A | Discharge status: Home / Self Care | Payer: Medicare Other | Source: Ambulatory Visit | Attending: Cardiology | Admitting: Cardiology

## 2012-06-25 DIAGNOSIS — R079 Chest pain, unspecified: Secondary | ICD-10-CM | POA: Insufficient documentation

## 2012-06-25 DIAGNOSIS — I2510 Atherosclerotic heart disease of native coronary artery without angina pectoris: Secondary | ICD-10-CM | POA: Insufficient documentation

## 2012-06-25 DIAGNOSIS — J449 Chronic obstructive pulmonary disease, unspecified: Secondary | ICD-10-CM

## 2012-06-25 DIAGNOSIS — J4489 Other specified chronic obstructive pulmonary disease: Secondary | ICD-10-CM | POA: Insufficient documentation

## 2012-06-25 DIAGNOSIS — I10 Essential (primary) hypertension: Secondary | ICD-10-CM | POA: Insufficient documentation

## 2012-06-25 DIAGNOSIS — E785 Hyperlipidemia, unspecified: Secondary | ICD-10-CM | POA: Insufficient documentation

## 2012-06-25 HISTORY — DX: Unspecified asthma, uncomplicated: J45.909

## 2012-06-25 MED ORDER — TECHNETIUM TC 99M SESTAMIBI - CARDIOLITE
30.0000 | Freq: Once | INTRAVENOUS | Status: AC | PRN
  Administered 2012-06-25: 30 via INTRAVENOUS

## 2012-06-25 MED ORDER — REGADENOSON 0.4 MG/5ML IV SOLN
INTRAVENOUS | Status: AC
  Administered 2012-06-25: 0.4 mg via INTRAVENOUS
  Filled 2012-06-25: qty 5

## 2012-06-25 MED ORDER — SODIUM CHLORIDE 0.9 % IJ SOLN
INTRAMUSCULAR | Status: AC
  Administered 2012-06-25: 10 mL via INTRAVENOUS
  Filled 2012-06-25: qty 10

## 2012-06-25 MED ORDER — TECHNETIUM TC 99M SESTAMIBI - CARDIOLITE
10.0000 | Freq: Once | INTRAVENOUS | Status: AC | PRN
  Administered 2012-06-25: 10:00:00 10 via INTRAVENOUS

## 2012-06-25 NOTE — Progress Notes (Signed)
Stress Lab Nurses Notes - [name]  [name] 06/25/2012 Reason for doing test: CAD and Chest Pain Type of test: [name] Nurse performing test: Parke Poisson, RN Nuclear Medicine Tech: Lyndel Pleasure Echo Tech: Not Applicable MD performing test: Ival Bible  & Jacolyn Reedy, PA Family MD: Dr. Kriste Basque Test explained and consent signed: yes IV started: 22g jelco, Saline lock flushed, No redness or edema and Saline lock started in radiology Symptoms: SOB Treatment/Intervention: None Reason test stopped: protocol completed After recovery IV was: Discontinued via X-ray tech and No redness or edema Patient to return to Nuc. Med at : 12:30 Patient discharged: Home Patient's Condition upon discharge was: stable Comments: During test 168/81 & HR 98.  Recovery BP 167/80 & HR 88.  Symptom resolved in recovery. Erskine Speed T

## 2012-06-26 ENCOUNTER — Encounter (HOSPITAL_COMMUNITY): Payer: Self-pay

## 2012-06-26 ENCOUNTER — Encounter (HOSPITAL_COMMUNITY)
Admission: RE | Admit: 2012-06-26 | Discharge: 2012-06-26 | Disposition: A | Discharge status: Home / Self Care | Payer: Medicare Other | Source: Ambulatory Visit | Attending: Ophthalmology | Admitting: Ophthalmology

## 2012-06-26 LAB — BASIC METABOLIC PANEL
BUN: 20 mg/dL (ref 6–23)
CO2: 29 mEq/L (ref 19–32)
Chloride: 99 mEq/L (ref 96–112)
GFR calc Af Amer: 55 mL/min — ABNORMAL LOW (ref >90)
Potassium: 4 mEq/L (ref 3.5–5.1)

## 2012-06-26 LAB — HEMOGLOBIN AND HEMATOCRIT, BLOOD: Hemoglobin: 12.6 g/dL (ref 12.0–15.0)

## 2012-06-26 NOTE — Patient Instructions (Addendum)
Your procedure is scheduled on:  06/30/2012  Report to [facility] at    6:15  AM.  Call this number if you have problems the morning of surgery: [phone]   Remember:   Do not eat or drink :After Midnight.    Take these medicines the morning of surgery with A SIP OF WATER: Atenolol, Losartan, Omeprazole and use albuterol neb and atrovent inhaler   Do not wear jewelry, make-up or nail polish.  Do not wear lotions, powders, or perfumes. You may wear deodorant.  Do not shave 48 hours prior to surgery.  Do not bring valuables to the hospital.  Contacts, dentures or bridgework may not be worn into surgery.  Patients discharged the day of surgery will not be allowed to drive home.  Name and phone number of your driver:    Please read over the following fact sheets that you were given: Pain Booklet, Surgical Site Infection Prevention, Anesthesia Post-op Instructions and Care and Recovery After Surgery  Cataract Surgery  A cataract is a clouding of the lens of the eye. When a lens becomes cloudy, vision is reduced based on the degree and nature of the clouding. Surgery may be needed to improve vision. Surgery removes the cloudy lens and usually replaces it with a substitute lens (intraocular lens, IOL). LET YOUR EYE DOCTOR KNOW ABOUT:  Allergies to food or medicine.   Medicines taken including herbs, eyedrops, over-the-counter medicines, and creams.   Use of steroids (by mouth or creams).   Previous problems with anesthetics or numbing medicine.   History of bleeding problems or blood clots.   Previous surgery.   Other health problems, including diabetes and kidney problems.   Possibility of pregnancy, if this applies.  RISKS AND COMPLICATIONS  Infection.   Inflammation of the eyeball (endophthalmitis) that can spread to both eyes (sympathetic ophthalmia).   Poor wound healing.   If an IOL is inserted, it can later fall out of proper position. This is very uncommon.    Clouding of the part of your eye that holds an IOL in place. This is called an "after-cataract." These are uncommon, but easily treated.  BEFORE THE PROCEDURE  Do not eat or drink anything except small amounts of water for 8 to 12 before your surgery, or as directed by your caregiver.   Unless you are told otherwise, continue any eyedrops you have been prescribed.   Talk to your primary caregiver about all other medicines that you take (both prescription and non-prescription). In some cases, you may need to stop or change medicines near the time of your surgery. This is most important if you are taking blood-thinning medicine.Do not stop medicines unless you are told to do so.   Arrange for someone to drive you to and from the procedure.   Do not put contact lenses in either eye on the day of your surgery.  PROCEDURE There is more than one method for safely removing a cataract. Your doctor can explain the differences and help determine which is best for you. Phacoemulsification surgery is the most common form of cataract surgery.  An injection is given behind the eye or eyedrops are given to make this a painless procedure.   A small cut (incision) is made on the edge of the clear, dome-shaped surface that covers the front of the eye (cornea).   A tiny probe is painlessly inserted into the eye. This device gives off ultrasound waves that soften and break up the cloudy  center of the lens. This makes it easier for the cloudy lens to be removed by suction.   An IOL may be implanted.   The normal lens of the eye is covered by a clear capsule. Part of that capsule is intentionally left in the eye to support the IOL.   Your surgeon may or may not use stitches to close the incision.  There are other forms of cataract surgery that require a larger incision and stiches to close the eye. This approach is taken in cases where the doctor feels that the cataract cannot be easily removed using  phacoemulsification. AFTER THE PROCEDURE  When an IOL is implanted, it does not need care. It becomes a permanent part of your eye and cannot be seen or felt.   Your doctor will schedule follow-up exams to check on your progress.   Review your other medicines with your doctor to see which can be resumed after surgery.   Use eyedrops or take medicine as prescribed by your doctor.  Document Released: 02/08/2011 Document Reviewed: 02/05/2011 [facility] Patient Information 2012 [place].  .Cataract Surgery Care After Refer to this sheet in the next few weeks. These instructions provide you with information on caring for yourself after your procedure. Your caregiver may also give you more specific instructions. Your treatment has been planned according to current medical practices, but problems sometimes occur. Call your caregiver if you have any problems or questions after your procedure.  HOME CARE INSTRUCTIONS   Avoid strenuous activities as directed by your caregiver.   Ask your caregiver when you can resume driving.   Use eyedrops or other medicines to help healing and control pressure inside your eye as directed by your caregiver.   Only take over-the-counter or prescription medicines for pain, discomfort, or fever as directed by your caregiver.   Do not to touch or rub your eyes.   You may be instructed to use a protective shield during the first few days and nights after surgery. If not, wear sunglasses to protect your eyes. This is to protect the eye from pressure or from being accidentally bumped.   Keep the area around your eye clean and dry. Avoid swimming or allowing water to hit you directly in the face while showering. Keep soap and shampoo out of your eyes.   Do not bend or lift heavy objects. Bending increases pressure in the eye. You can walk, climb stairs, and do light household chores.   Do not put a contact lens into the eye that had surgery until your caregiver  says it is okay to do so.   Ask your doctor when you can return to work. This will depend on the kind of work that you do. If you work in a dusty environment, you may be advised to wear protective eyewear for a period of time.   Ask your caregiver when it will be safe to engage in sexual activity.   Continue with your regular eye exams as directed by your caregiver.  What to expect:  It is normal to feel itching and mild discomfort for a few days after cataract surgery. Some fluid discharge is also common, and your eye may be sensitive to light and touch.   After 1 to 2 days, even moderate discomfort should disappear. In most cases, healing will take about 6 weeks.   If you received an intraocular lens (IOL), you may notice that colors are very bright or have a blue tinge. Also, if  you have been in bright sunlight, everything may appear reddish for a few hours. If you see these color tinges, it is because your lens is clear and no longer cloudy. Within a few months after receiving an IOL, these extra colors should go away. When you have healed, you will probably need new glasses.  SEEK MEDICAL CARE IF:   You have increased bruising around your eye.   You have discomfort not helped by medicine.  SEEK IMMEDIATE MEDICAL CARE IF:   You have a fever.   You have a worsening or sudden vision loss.   You have redness, swelling, or increasing pain in the eye.   You have a thick discharge from the eye that had surgery.  MAKE SURE YOU:  Understand these instructions.   Will watch your condition.   Will get help right away if you are not doing well or get worse.  Document Released: 09/08/2004 Document Revised: 02/08/2011 Document Reviewed: 10/13/2010 [facility] Patient Information 2012 [place], [place]. PATIENT INSTRUCTIONS POST-ANESTHESIA  IMMEDIATELY FOLLOWING SURGERY:  Do not drive or operate machinery for the first twenty four hours after surgery.  Do not make any important decisions  for twenty four hours after surgery or while taking narcotic pain medications or sedatives.  If you develop intractable nausea and vomiting or a severe headache please notify your doctor immediately.  FOLLOW-UP:  Please make an appointment with your surgeon as instructed. You do not need to follow up with anesthesia unless specifically instructed to do so.  WOUND CARE INSTRUCTIONS (if applicable):  Keep a dry clean dressing on the anesthesia/puncture wound site if there is drainage.  Once the wound has quit draining you may leave it open to air.  Generally you should leave the bandage intact for twenty four hours unless there is drainage.  If the epidural site drains for more than 36-48 hours please call the anesthesia department.  QUESTIONS?:  Please feel free to call your physician or the hospital operator if you have any questions, and they will be happy to assist you.

## 2012-06-27 ENCOUNTER — Encounter (HOSPITAL_COMMUNITY): Payer: Self-pay | Admitting: Pharmacy Technician

## 2012-06-27 MED ORDER — LIDOCAINE HCL (PF) 1 % IJ SOLN
INTRAMUSCULAR | Status: AC
  Filled 2012-06-27: qty 2

## 2012-06-27 MED ORDER — NEOMYCIN-POLYMYXIN-DEXAMETH 3.5-10000-0.1 OP OINT
TOPICAL_OINTMENT | OPHTHALMIC | Status: AC
  Filled 2012-06-27: qty 3.5

## 2012-06-27 MED ORDER — TETRACAINE HCL 0.5 % OP SOLN
OPHTHALMIC | Status: AC
  Filled 2012-06-27: qty 2

## 2012-06-27 MED ORDER — LIDOCAINE HCL 3.5 % OP GEL
OPHTHALMIC | Status: AC
  Filled 2012-06-27: qty 5

## 2012-06-27 MED ORDER — CYCLOPENTOLATE-PHENYLEPHRINE 0.2-1 % OP SOLN
OPHTHALMIC | Status: AC
  Filled 2012-06-27: qty 2

## 2012-06-27 MED ORDER — PHENYLEPHRINE HCL 2.5 % OP SOLN
OPHTHALMIC | Status: AC
  Filled 2012-06-27: qty 2

## 2012-06-30 ENCOUNTER — Encounter (HOSPITAL_COMMUNITY): Payer: Self-pay | Admitting: Anesthesiology

## 2012-06-30 ENCOUNTER — Ambulatory Visit (HOSPITAL_COMMUNITY): Payer: Medicare Other | Admitting: Anesthesiology

## 2012-06-30 ENCOUNTER — Ambulatory Visit (HOSPITAL_COMMUNITY)
Admission: RE | Admit: 2012-06-30 | Discharge: 2012-06-30 | Disposition: A | Discharge status: Home Health | Payer: Medicare Other | Source: Ambulatory Visit | Attending: Ophthalmology | Admitting: Ophthalmology

## 2012-06-30 ENCOUNTER — Encounter (HOSPITAL_COMMUNITY)
Admission: RE | Disposition: A | Discharge status: Home Health | Payer: Self-pay | Source: Ambulatory Visit | Attending: Ophthalmology

## 2012-06-30 ENCOUNTER — Encounter (HOSPITAL_COMMUNITY): Payer: Self-pay | Admitting: *Deleted

## 2012-06-30 DIAGNOSIS — J4489 Other specified chronic obstructive pulmonary disease: Secondary | ICD-10-CM | POA: Insufficient documentation

## 2012-06-30 DIAGNOSIS — E119 Type 2 diabetes mellitus without complications: Secondary | ICD-10-CM | POA: Insufficient documentation

## 2012-06-30 DIAGNOSIS — H2589 Other age-related cataract: Secondary | ICD-10-CM | POA: Insufficient documentation

## 2012-06-30 DIAGNOSIS — I10 Essential (primary) hypertension: Secondary | ICD-10-CM | POA: Insufficient documentation

## 2012-06-30 DIAGNOSIS — J449 Chronic obstructive pulmonary disease, unspecified: Secondary | ICD-10-CM | POA: Insufficient documentation

## 2012-06-30 DIAGNOSIS — Z01812 Encounter for preprocedural laboratory examination: Secondary | ICD-10-CM | POA: Insufficient documentation

## 2012-06-30 HISTORY — PX: CATARACT EXTRACTION W/PHACO: SHX586

## 2012-06-30 LAB — GLUCOSE, CAPILLARY: Glucose-Capillary: 118 mg/dL — ABNORMAL HIGH (ref 70–99)

## 2012-06-30 SURGERY — PHACOEMULSIFICATION, CATARACT, WITH IOL INSERTION
Anesthesia: Monitor Anesthesia Care | Site: Eye | Laterality: Left | Wound class: Clean

## 2012-06-30 MED ORDER — MIDAZOLAM HCL 2 MG/2ML IJ SOLN
1.0000 mg | INTRAMUSCULAR | Status: DC | PRN
  Administered 2012-06-30: 2 mg via INTRAVENOUS

## 2012-06-30 MED ORDER — NEOMYCIN-POLYMYXIN-DEXAMETH 0.1 % OP OINT
TOPICAL_OINTMENT | OPHTHALMIC | Status: DC | PRN
  Administered 2012-06-30: 1 via OPHTHALMIC

## 2012-06-30 MED ORDER — BSS IO SOLN
INTRAOCULAR | Status: DC | PRN
  Administered 2012-06-30: 15 mL via INTRAOCULAR

## 2012-06-30 MED ORDER — PHENYLEPHRINE HCL 2.5 % OP SOLN
1.0000 [drp] | OPHTHALMIC | Status: AC
  Administered 2012-06-30 (×3): 1 [drp] via OPHTHALMIC

## 2012-06-30 MED ORDER — EPINEPHRINE HCL 1 MG/ML IJ SOLN
INTRAOCULAR | Status: DC | PRN
  Administered 2012-06-30: 08:00:00

## 2012-06-30 MED ORDER — LIDOCAINE HCL 3.5 % OP GEL
1.0000 "application " | Freq: Once | OPHTHALMIC | Status: AC
  Administered 2012-06-30: 1 via OPHTHALMIC

## 2012-06-30 MED ORDER — PROVISC 10 MG/ML IO SOLN
INTRAOCULAR | Status: DC | PRN
  Administered 2012-06-30: 8.5 mg via INTRAOCULAR

## 2012-06-30 MED ORDER — POVIDONE-IODINE 5 % OP SOLN
OPHTHALMIC | Status: DC | PRN
  Administered 2012-06-30: 1 via OPHTHALMIC

## 2012-06-30 MED ORDER — EPINEPHRINE HCL 1 MG/ML IJ SOLN
INTRAMUSCULAR | Status: AC
  Filled 2012-06-30: qty 1

## 2012-06-30 MED ORDER — LACTATED RINGERS IV SOLN
INTRAVENOUS | Status: DC | PRN
  Administered 2012-06-30: 07:00:00 via INTRAVENOUS

## 2012-06-30 MED ORDER — TETRACAINE HCL 0.5 % OP SOLN
1.0000 [drp] | OPHTHALMIC | Status: AC
  Administered 2012-06-30 (×3): 1 [drp] via OPHTHALMIC

## 2012-06-30 MED ORDER — CYCLOPENTOLATE-PHENYLEPHRINE 0.2-1 % OP SOLN
1.0000 [drp] | OPHTHALMIC | Status: AC
  Administered 2012-06-30 (×3): 1 [drp] via OPHTHALMIC

## 2012-06-30 MED ORDER — LACTATED RINGERS IV SOLN
INTRAVENOUS | Status: DC
  Administered 2012-06-30: 1000 mL via INTRAVENOUS

## 2012-06-30 MED ORDER — MIDAZOLAM HCL 2 MG/2ML IJ SOLN
INTRAMUSCULAR | Status: AC
  Filled 2012-06-30: qty 2

## 2012-06-30 MED ORDER — LIDOCAINE HCL (PF) 1 % IJ SOLN
INTRAMUSCULAR | Status: DC | PRN
  Administered 2012-06-30: .5 mL

## 2012-06-30 SURGICAL SUPPLY — 32 items
CAPSULAR TENSION RING-AMO (OPHTHALMIC RELATED) IMPLANT
CLOTH BEACON ORANGE TIMEOUT ST (SAFETY) ×1 IMPLANT
EYE SHIELD UNIVERSAL CLEAR (GAUZE/BANDAGES/DRESSINGS) ×1 IMPLANT
GLOVE BIO SURGEON STRL SZ 6.5 (GLOVE) IMPLANT
GLOVE BIOGEL PI IND STRL 6.5 (GLOVE) IMPLANT
GLOVE BIOGEL PI IND STRL 7.0 (GLOVE) IMPLANT
GLOVE BIOGEL PI IND STRL 7.5 (GLOVE) IMPLANT
GLOVE BIOGEL PI INDICATOR 6.5 (GLOVE) ×2
GLOVE BIOGEL PI INDICATOR 7.0 (GLOVE)
GLOVE BIOGEL PI INDICATOR 7.5 (GLOVE)
GLOVE ECLIPSE 6.5 STRL STRAW (GLOVE) IMPLANT
GLOVE ECLIPSE 7.0 STRL STRAW (GLOVE) IMPLANT
GLOVE ECLIPSE 7.5 STRL STRAW (GLOVE) IMPLANT
GLOVE EXAM NITRILE LRG STRL (GLOVE) IMPLANT
GLOVE EXAM NITRILE MD LF STRL (GLOVE) ×1 IMPLANT
GLOVE SKINSENSE NS SZ6.5 (GLOVE)
GLOVE SKINSENSE NS SZ7.0 (GLOVE)
GLOVE SKINSENSE STRL SZ6.5 (GLOVE) IMPLANT
GLOVE SKINSENSE STRL SZ7.0 (GLOVE) IMPLANT
KIT VITRECTOMY (OPHTHALMIC RELATED) IMPLANT
PAD ARMBOARD 7.5X6 YLW CONV (MISCELLANEOUS) ×1 IMPLANT
PROC W NO LENS (INTRAOCULAR LENS)
PROC W SPEC LENS (INTRAOCULAR LENS)
PROCESS W NO LENS (INTRAOCULAR LENS) IMPLANT
PROCESS W SPEC LENS (INTRAOCULAR LENS) IMPLANT
RING MALYGIN (MISCELLANEOUS) IMPLANT
SIGHTPATH CAT PROC W REG LENS (Ophthalmic Related) ×2 IMPLANT
SYR TB 1ML LL NO SAFETY (SYRINGE) ×1 IMPLANT
TAPE SURG TRANSPORE 1 IN (GAUZE/BANDAGES/DRESSINGS) IMPLANT
TAPE SURGICAL TRANSPORE 1 IN (GAUZE/BANDAGES/DRESSINGS) ×1
VISCOELASTIC ADDITIONAL (OPHTHALMIC RELATED) IMPLANT
WATER STERILE IRR 250ML POUR (IV SOLUTION) ×1 IMPLANT

## 2012-06-30 NOTE — Anesthesia Preprocedure Evaluation (Signed)
Anesthesia Evaluation  Patient identified by MRN, date of birth, ID band Patient awake    Reviewed: Allergy & Precautions, H&P , NPO status , Patient's Chart, lab work & pertinent test results  Airway Mallampati: III TM Distance: <3 FB Neck ROM: Full  Mouth opening: Limited Mouth Opening  Dental  (+) Edentulous Upper   Pulmonary asthma , sleep apnea , COPD breath sounds clear to auscultation- rhonchi        Cardiovascular hypertension, + CAD, + Past MI and + Peripheral Vascular Disease Rhythm:Regular Rate:Normal     Neuro/Psych Anxiety    GI/Hepatic GERD-  ,  Endo/Other  diabetes, Well Controlled, Type 2  Renal/GU      Musculoskeletal   Abdominal   Peds  Hematology   Anesthesia Other Findings   Reproductive/Obstetrics                           Anesthesia Physical Anesthesia Plan  ASA: III  Anesthesia Plan: MAC   Post-op Pain Management:    Induction: Intravenous  Airway Management Planned: Nasal Cannula  Additional Equipment:   Intra-op Plan:   Post-operative Plan:   Informed Consent: I have reviewed the patients History and Physical, chart, labs and discussed the procedure including the risks, benefits and alternatives for the proposed anesthesia with the patient or authorized representative who has indicated his/her understanding and acceptance.     Plan Discussed with:   Anesthesia Plan Comments:         Anesthesia Quick Evaluation

## 2012-06-30 NOTE — Op Note (Signed)
Date of Admission: 06/30/12  Date of Surgery: 06/30/12  Pre-Op Dx: Cataract  Left Eye  Post-Op Dx: Cataract  Left  Eye, Dx Code 366.19  Surgeon: Gemma Payor, M.D.  Assistants: None  Anesthesia: Topical with MAC  Indications: Painless, progressive loss of vision with compromise of daily activities.  Surgery: Cataract Extraction with Intraocular lens Implant Left Eye  Discription: The patient had dilating drops and viscous lidocaine placed into the left eye in the pre-op holding area. After transfer to the operating room, a time out was performed. The patient was then prepped and draped. Beginning with a 75 degree blade a paracentesis port was made at the surgeon's 2 o'clock position. The anterior chamber was then filled with 2% non-preserved lidocaine. This was followed by filling the anterior chamber with Provisc. A bent cystatome needle was used to create a continuous tear capsulotomy. Hydrodissection was performed with balanced salt solution on a Fine canula. The lens nucleus was then removed using the phacoemulsification handpiece. Residual cortex was removed with the I&A handpiece. The anterior chamber and capsular bag were refilled with Provisc. A posterior chamber intraocular lens was placed into the capsular bag with it's injector. The implant was positioned with the Kuglan hook. The Provisc was then removed from the anterior chamber and capsular bag with the I&A handpiece. Stromal hydration of the main incision and paracentesis port was performed with BSS on a Fine canula. The wounds were tested for leak which was negative. The patient tolerated the procedure well. There were no operative complications. The patient was then transferred to the recovery room in stable condition.  Prosthetic device: EnVista, MX60, power 16.5D.  Specimen: None  EBL: None  Complications: None

## 2012-06-30 NOTE — Preoperative (Signed)
Beta Blockers   Reason not to administer Beta Blockers:Not Applicable 

## 2012-06-30 NOTE — Anesthesia Postprocedure Evaluation (Signed)
  Anesthesia Post-op Note  Patient: [name]  Procedure(s) Performed: Procedure(s) with comments: CATARACT EXTRACTION PHACO AND INTRAOCULAR LENS PLACEMENT (IOC) (Left) - CDE:12.32  Patient Location: Short Stay  Anesthesia Type:MAC  Level of Consciousness: awake, alert , oriented and patient cooperative  Airway and Oxygen Therapy: Patient Spontanous Breathing and Patient connected to nasal cannula oxygen  Post-op Pain: none  Post-op Assessment: Post-op Vital signs reviewed, Patient's Cardiovascular Status Stable, Respiratory Function Stable, Patent Airway and No signs of Nausea or vomiting  Post-op Vital Signs: Reviewed and stable  Complications: No apparent anesthesia complications

## 2012-06-30 NOTE — Anesthesia Procedure Notes (Signed)
Procedure Name: MAC Performed by: ANDRAZA, Molli Gethers L Pre-anesthesia Checklist: Patient identified, Timeout performed, Emergency Drugs available, Suction available and Patient being monitored Oxygen Delivery Method: Nasal cannula     

## 2012-06-30 NOTE — H&P (Signed)
I have reviewed the H&P, the patient was re-examined, and I have identified no interval changes in medical condition and plan of care since the history and physical of record  

## 2012-06-30 NOTE — Transfer of Care (Signed)
Immediate Anesthesia Transfer of Care Note  Patient: [name]  Procedure(s) Performed: Procedure(s) with comments: CATARACT EXTRACTION PHACO AND INTRAOCULAR LENS PLACEMENT (IOC) (Left) - CDE:12.32  Patient Location: Short stay  Anesthesia Type:MAC  Level of Consciousness: awake, alert , oriented and patient cooperative  Airway & Oxygen Therapy: Patient Spontanous Breathing and Patient connected to nasal cannula oxygen  Post-op Assessment: Report given to PACU RN and Post -op Vital signs reviewed and stable  Post vital signs: Reviewed and stable  Complications: No apparent anesthesia complications

## 2012-07-03 ENCOUNTER — Encounter (HOSPITAL_COMMUNITY): Payer: Self-pay | Admitting: Ophthalmology

## 2012-07-03 HISTORY — PX: CARDIAC CATHETERIZATION: SHX172

## 2012-07-04 ENCOUNTER — Other Ambulatory Visit: Payer: Self-pay | Admitting: Cardiology

## 2012-07-04 ENCOUNTER — Ambulatory Visit (INDEPENDENT_AMBULATORY_CARE_PROVIDER_SITE_OTHER): Payer: Medicare Other | Admitting: Cardiology

## 2012-07-04 ENCOUNTER — Ambulatory Visit (HOSPITAL_COMMUNITY)
Admission: RE | Admit: 2012-07-04 | Discharge: 2012-07-04 | Disposition: A | Discharge status: Home / Self Care | Payer: Medicare Other | Source: Ambulatory Visit | Attending: Cardiology | Admitting: Cardiology

## 2012-07-04 ENCOUNTER — Encounter: Payer: Self-pay | Admitting: Cardiology

## 2012-07-04 ENCOUNTER — Encounter: Payer: Self-pay | Admitting: *Deleted

## 2012-07-04 VITALS — BP 139/78 | HR 71 | Ht 64.0 in | Wt 205.0 lb

## 2012-07-04 DIAGNOSIS — J449 Chronic obstructive pulmonary disease, unspecified: Secondary | ICD-10-CM

## 2012-07-04 DIAGNOSIS — I2589 Other forms of chronic ischemic heart disease: Secondary | ICD-10-CM

## 2012-07-04 DIAGNOSIS — I2510 Atherosclerotic heart disease of native coronary artery without angina pectoris: Secondary | ICD-10-CM

## 2012-07-04 DIAGNOSIS — I255 Ischemic cardiomyopathy: Secondary | ICD-10-CM | POA: Insufficient documentation

## 2012-07-04 DIAGNOSIS — E785 Hyperlipidemia, unspecified: Secondary | ICD-10-CM

## 2012-07-04 DIAGNOSIS — D649 Anemia, unspecified: Secondary | ICD-10-CM

## 2012-07-04 DIAGNOSIS — I10 Essential (primary) hypertension: Secondary | ICD-10-CM

## 2012-07-04 DIAGNOSIS — J4489 Other specified chronic obstructive pulmonary disease: Secondary | ICD-10-CM

## 2012-07-04 DIAGNOSIS — Z01810 Encounter for preprocedural cardiovascular examination: Secondary | ICD-10-CM

## 2012-07-04 DIAGNOSIS — I517 Cardiomegaly: Secondary | ICD-10-CM | POA: Insufficient documentation

## 2012-07-04 DIAGNOSIS — R0602 Shortness of breath: Secondary | ICD-10-CM | POA: Insufficient documentation

## 2012-07-04 LAB — BASIC METABOLIC PANEL
Calcium: 9.4 mg/dL (ref 8.4–10.5)
Potassium: 3.8 mEq/L (ref 3.5–5.3)
Sodium: 142 mEq/L (ref 135–145)

## 2012-07-04 LAB — CBC
MCH: 30.6 pg (ref 26.0–34.0)
MCHC: 34.1 g/dL (ref 30.0–36.0)
Platelets: 281 10*3/uL (ref 150–400)
RDW: 12.4 % (ref 11.5–15.5)

## 2012-07-04 LAB — PROTIME-INR
INR: 0.9 (ref <1.50)
Prothrombin Time: 12.1 seconds (ref 11.6–15.2)

## 2012-07-04 NOTE — Assessment & Plan Note (Signed)
She continues on Zocor, LDL has been well controlled in general.

## 2012-07-04 NOTE — Progress Notes (Signed)
 Clinical Summary [name] is a 72 y.o.female seen recently in the office in mid April. She was reporting progressive angina symptoms at that time and was referred for followup testing.  Lexiscan Myoview was abnormal, overall intermediate risk. There were no diagnostic ST segment changes. Scar was noted within the inferior apical, inferior septal, and anteroseptal walls. Mild degree of peri-infarct ischemia noted in the anteroapical distribution. LVEF was reduced at 40% with wall motion abnormalities.  She is here with one of her daughters today. We reviewed test results and discussed the implications. She almost certainly has had progression in CAD over the last decade, now has also evidence of decreased LV function with her progressive angina symptoms. We discussed proceeding to a diagnostic cardiac catheterization to reassess coronary anatomy and determine if there any viable revascularization options. Hopefully there will be something that can be addressed percutaneously as I suspect her candidacy for CABG would be overall poor.  We discussed the risks and benefits, and she is in agreement to proceed.   Allergies  Allergen Reactions  . Codeine     vomiting  . Terbutaline Sulfate     REACTION: INTOL to terbutaline    Current Outpatient Prescriptions  Medication Sig Dispense Refill  . acetaminophen (TYLENOL) 500 MG tablet Take 500 mg by mouth every 6 (six) hours as needed. pain      . albuterol (PROVENTIL) (2.5 MG/3ML) 0.083% nebulizer solution Take 3 mLs (2.5 mg total) by nebulization 4 (four) times daily.  75 mL  11  . ALPRAZolam (XANAX) 0.5 MG tablet TAKE ONE-HALF TO ONE TABLET BY MOUTH THREE TIMES DAILY AS NEEDED  90 tablet  5  . Alum & Mag Hydroxide-Simeth (MAGIC MOUTHWASH) SOLN 1 tsp gargle and swallow four times daily as needed for sore throat  120 mL  5  . aspirin 81 MG tablet Take 81 mg by mouth daily.      . atenolol (TENORMIN) 50 MG tablet Take 1 tablet (50 mg total) by  mouth daily.  30 tablet  11  . BESIVANCE 0.6 % SUSP Place 1 drop into both eyes 4 (four) times daily.      . budesonide (PULMICORT) 0.25 MG/2ML nebulizer solution Take 2 mLs (0.25 mg total) by nebulization 2 (two) times daily.  60 mL  11  . calcium-vitamin D (OSCAL WITH D) 500-200 MG-UNIT per tablet Take 1 tablet by mouth 2 (two) times daily.        . Cholecalciferol (VITAMIN D3) 1000 UNITS CAPS Take by mouth daily.        . dextromethorphan-guaiFENesin (MUCINEX DM) 30-600 MG per 12 hr tablet Take 2 tablets by mouth every 12 (twelve) hours.        . DUREZOL 0.05 % EMUL Place 1 drop into both eyes 4 (four) times daily.      . etodolac (LODINE) 400 MG tablet TAKE ONE TABLET BY MOUTH EVERY 12 HOURS  60 tablet  3  . ferrous sulfate 325 (65 FE) MG tablet Take 325 mg by mouth daily with breakfast.       . fluticasone (FLONASE) 50 MCG/ACT nasal spray USE TWO SPRAY EACH NOSTRIL TWICE DAILY  16 g  5  . furosemide (LASIX) 40 MG tablet TAKE ONE TABLET BY MOUTH EVERY DAY  30 tablet  5  . gabapentin (NEURONTIN) 100 MG capsule Take 3 capsules by mouth at bedtime  100 capsule  6  . ibandronate (BONIVA) 150 MG tablet TAKE 1 TABLET BY MOUTH ONCE A   MONTH  1 tablet  11  . insulin NPH-insulin regular (HUMULIN 70/30) (70-30) 100 UNIT/ML injection Inject 30 Units into the skin 2 (two) times daily.      . ipratropium (ATROVENT) 0.02 % nebulizer solution Take 2.5 mLs (500 mcg total) by nebulization 4 (four) times daily.  75 mL  11  . losartan (COZAAR) 50 MG tablet TAKE ONE TABLET BY MOUTH EVERY DAY  30 tablet  6  . metFORMIN (GLUCOPHAGE) 500 MG tablet TAKE ONE TABLET BY MOUTH TWICE DAILY  60 tablet  5  . nitroGLYCERIN (NITROSTAT) 0.4 MG SL tablet Place 1 tablet (0.4 mg total) under the tongue every 5 (five) minutes as needed. May repeat x3  25 tablet  6  . omeprazole (PRILOSEC) 20 MG capsule TAKE ONE CAPSULE BY MOUTH EVERY DAY 30 MINUTES BEFORE A MEAL  30 capsule  10  . Probiotic Product (PROBIOTIC DAILY PO) Take 1  tablet by mouth daily.       . PROLENSA 0.07 % SOLN Place 1 drop into both eyes 4 (four) times daily.      . simvastatin (ZOCOR) 80 MG tablet TAKE ONE TABLET BY MOUTH AT BEDTIME  30 tablet  6  . traMADol (ULTRAM) 50 MG tablet TAKE ONE TABLET BY MOUTH EVERY 8 HOURS AS NEEDED FOR PAIN  90 tablet  0  . vitamin B-12 (CYANOCOBALAMIN) 500 MCG tablet Take 1,000 mcg by mouth daily.        No current facility-administered medications for this visit.    Past Medical History  Diagnosis Date  . COPD (chronic obstructive pulmonary disease)   . Hyperlipidemia   . Essential hypertension, benign   . Anemia   . Anxiety   . Arthritis   . Coronary atherosclerosis of native coronary artery     PTCA/BMS LAD 2000, PTCA/brachytherapy LAD 2001, LVEF 55%  . Type 2 diabetes mellitus   . Osteoporosis   . Venous insufficiency   . Obesity   . Myocardial infarction     AMI 2000  . Asthma   . Sleep apnea     Past Surgical History  Procedure Laterality Date  . Total abdominal hysterectomy    . Cholecystectomy    . Rotator cuff repair Right   . Cataract extraction w/phaco Left 06/30/2012    Procedure: CATARACT EXTRACTION PHACO AND INTRAOCULAR LENS PLACEMENT (IOC);  Surgeon: Kerry Hunt, MD;  Location: [facility];  Service: Ophthalmology;  Laterality: Left;  CDE:12.32    Family History  Problem Relation Age of Onset  . Coronary artery disease Other     Social History [name] reports that she quit smoking about 14 years ago. Her smoking use included Cigarettes. She has a 30 pack-year smoking history. She does not have any smokeless tobacco history on file. [name] reports that she does not drink alcohol.  Review of Systems No reported bleeding problems. Stable shortness of breath with continued intermittent angina symptoms that have been progressive over the last several months. She is using her oxygen regularly. Otherwise negative.  Physical Examination Filed Vitals:   07/04/12 1522  BP: 139/78    Pulse: 71   Filed Weights   07/04/12 1522  Weight: 205 lb (92.987 kg)    No acute distress. Wearing oxygen via nasal cannula.  HEENT: Conjunctiva and lids normal, oropharynx with moist mucosa.  Neck: Supple, no elevated JVP or bruits.  Lungs: Diminished breath sounds throughout, Nonlabored. No wheezing  Cardiac: Regular rate and rhythm with occasional ectopic beat,   no S3 gallop or rub.  Abdomen: Soft, nontender, bowel sounds present.  Skin: Warm and dry.  Extremities: No pitting edema.  Musculoskeletal: No kyphosis.  Neuropsychiatric: Alert and oriented x3, affect appropriate.   Problem List and Plan   CORONARY ATHEROSCLEROSIS NATIVE CORONARY ARTERY Progressive angina symptoms on medical therapy with previous history of interventions to the LAD as outlined above, now status post abnormal Myoview demonstrating mixed scar and ischemia with reduction in LVEF of 40%. She almost certainly has had progression in CAD, and question now is whether there are any viable revascularization options. We have discussed this, and plan will be to proceed with cardiac catheterization, risks and benefits discussed. Procedure is being scheduled with Dr. Cooper for next week.  Cardiomyopathy, ischemic LVEF 40% by recent Myoview.  COPD Severe, oxygen dependent, followed by Dr. Nadel. Suspect that she would be a poor candidate for CABG overall.  Essential hypertension, benign No change in current regimen.  HYPERLIPIDEMIA She continues on Zocor, LDL has been well controlled in general.    Temperance Kelemen G. Tiyana Galla, M.D., F.A.C.C.   

## 2012-07-04 NOTE — Assessment & Plan Note (Signed)
Severe, oxygen dependent, followed by Dr. Kriste Basque. Suspect that she would be a poor candidate for CABG overall.

## 2012-07-04 NOTE — Patient Instructions (Addendum)
Your physician recommends that you schedule a follow-up appointment in: POST CATH TEST 07-10-12  Your physician has requested that you have a cardiac catheterization. Cardiac catheterization is used to diagnose and/or treat various heart conditions. Doctors may recommend this procedure for a number of different reasons. The most common reason is to evaluate chest pain. Chest pain can be a symptom of coronary artery disease (CAD), and cardiac catheterization can show whether plaque is narrowing or blocking your heart's arteries. This procedure is also used to evaluate the valves, as well as measure the blood flow and oxygen levels in different parts of your heart. For further information please visit [email or web address] Please follow instruction sheet, as given. 07-10-12 ARRIVE AT 8:30AM  Your physician recommends that you return for lab work in: TODAY (BMET,CBC,PT,PT/INR) SLIPS GIVEN  A chest x-ray takes a picture of the organs and structures inside the chest, including the heart, lungs, and blood vessels. This test can show several things, including, whether the heart is enlarges; whether fluid is building up in the lungs; and whether pacemaker / defibrillator leads are still in place.TODAY  Your physician has recommended you make the following change in your medication:   1) START TAKING ASPIRIN 81MG  ONCE DAILY

## 2012-07-04 NOTE — Assessment & Plan Note (Signed)
Progressive angina symptoms on medical therapy with previous history of interventions to the LAD as outlined above, now status post abnormal Myoview demonstrating mixed scar and ischemia with reduction in LVEF of 40%. She almost certainly has had progression in CAD, and question now is whether there are any viable revascularization options. We have discussed this, and plan will be to proceed with cardiac catheterization, risks and benefits discussed. Procedure is being scheduled with Dr. Excell Seltzer for next week.

## 2012-07-04 NOTE — Assessment & Plan Note (Signed)
No change in current regimen. 

## 2012-07-04 NOTE — Assessment & Plan Note (Signed)
LVEF 40% by recent Myoview.

## 2012-07-10 ENCOUNTER — Inpatient Hospital Stay (HOSPITAL_BASED_OUTPATIENT_CLINIC_OR_DEPARTMENT_OTHER)
Admission: RE | Admit: 2012-07-10 | Discharge: 2012-07-10 | Disposition: A | Discharge status: Home / Self Care | Payer: Medicare Other | Source: Ambulatory Visit | Attending: Cardiovascular Disease | Admitting: Cardiovascular Disease

## 2012-07-10 ENCOUNTER — Encounter (HOSPITAL_BASED_OUTPATIENT_CLINIC_OR_DEPARTMENT_OTHER)
Admission: RE | Disposition: A | Discharge status: Home / Self Care | Payer: Self-pay | Source: Ambulatory Visit | Attending: Cardiovascular Disease

## 2012-07-10 DIAGNOSIS — I10 Essential (primary) hypertension: Secondary | ICD-10-CM | POA: Insufficient documentation

## 2012-07-10 DIAGNOSIS — I2589 Other forms of chronic ischemic heart disease: Secondary | ICD-10-CM | POA: Insufficient documentation

## 2012-07-10 DIAGNOSIS — I200 Unstable angina: Secondary | ICD-10-CM | POA: Insufficient documentation

## 2012-07-10 DIAGNOSIS — Y831 Surgical operation with implant of artificial internal device as the cause of abnormal reaction of the patient, or of later complication, without mention of misadventure at the time of the procedure: Secondary | ICD-10-CM | POA: Insufficient documentation

## 2012-07-10 DIAGNOSIS — I2510 Atherosclerotic heart disease of native coronary artery without angina pectoris: Secondary | ICD-10-CM

## 2012-07-10 DIAGNOSIS — T82897A Other specified complication of cardiac prosthetic devices, implants and grafts, initial encounter: Secondary | ICD-10-CM | POA: Insufficient documentation

## 2012-07-10 DIAGNOSIS — E119 Type 2 diabetes mellitus without complications: Secondary | ICD-10-CM | POA: Insufficient documentation

## 2012-07-10 DIAGNOSIS — E785 Hyperlipidemia, unspecified: Secondary | ICD-10-CM | POA: Insufficient documentation

## 2012-07-10 DIAGNOSIS — I519 Heart disease, unspecified: Secondary | ICD-10-CM | POA: Insufficient documentation

## 2012-07-10 DIAGNOSIS — Z9981 Dependence on supplemental oxygen: Secondary | ICD-10-CM | POA: Insufficient documentation

## 2012-07-10 DIAGNOSIS — J4489 Other specified chronic obstructive pulmonary disease: Secondary | ICD-10-CM | POA: Insufficient documentation

## 2012-07-10 DIAGNOSIS — J449 Chronic obstructive pulmonary disease, unspecified: Secondary | ICD-10-CM | POA: Insufficient documentation

## 2012-07-10 SURGERY — JV LEFT HEART CATHETERIZATION WITH CORONARY ANGIOGRAM
Anesthesia: Moderate Sedation

## 2012-07-10 MED ORDER — ACETAMINOPHEN 325 MG PO TABS: 650.0000 mg | ORAL_TABLET | ORAL | Status: DC | PRN

## 2012-07-10 MED ORDER — ONDANSETRON HCL 4 MG/2ML IJ SOLN: 4.0000 mg | Freq: Four times a day (QID) | INTRAMUSCULAR | Status: DC | PRN

## 2012-07-10 MED ORDER — SODIUM CHLORIDE 0.9 % IV SOLN
INTRAVENOUS | Status: DC
  Administered 2012-07-10: 09:00:00 via INTRAVENOUS

## 2012-07-10 MED ORDER — SODIUM CHLORIDE 0.9 % IV SOLN: 1.0000 mL/kg/h | INTRAVENOUS | Status: DC

## 2012-07-10 MED ORDER — DIAZEPAM 2 MG PO TABS: 2.0000 mg | ORAL_TABLET | ORAL | Status: DC | PRN

## 2012-07-10 MED ORDER — ASPIRIN 81 MG PO CHEW
324.0000 mg | CHEWABLE_TABLET | ORAL | Status: AC
  Administered 2012-07-10: 324 mg via ORAL

## 2012-07-10 NOTE — CV Procedure (Deleted)
   Cardiac Catheterization Procedure Note  Name: [name] MRN: [id number] DOB: 12-Nov-1940  Procedure: Right Heart Cath, Left Heart Cath, Selective Coronary Angiography, LV angiography, saphenous vein graft angiography, LIMA angiography.  Indication: CCS class III angina, shortness of breath, known coronary artery disease status post CABG.  Procedural Details: The right groin was prepped, draped, and anesthetized with 1% lidocaine. Using the modified Seldinger technique a 4 French sheath was placed in the right femoral artery and a 6 French sheath was placed in the right femoral vein. A multipurpose catheter was used for the right heart catheterization. Standard protocol was followed for recording of right heart pressures and sampling of oxygen saturations.  Fick cardiac output was calculated. Standard Judkins catheters were used for selective coronary angiography and left ventriculography. A 3 DRC catheter was used to image the saphenous vein graft to diagonal and LIMA to LAD.There were no immediate procedural complications. The patient was transferred to the post catheterization recovery area for further monitoring.  Procedural Findings: Hemodynamics RA mean of 3 RV 29/5 PA 28/9 with a mean of 18 PCWP mean of 4, V wave of 6 LV 103/8 AO 104/47 with a mean of 71  Oxygen saturations: PA 75 AO 95 SVC 71  Cardiac Output (Fick) 6 L/min  Cardiac Index (Fick) 2.3 L/min/meter squared   Coronary angiography: Coronary dominance: right  Left mainstem: The left main is widely patent without obstructive disease.  Left anterior descending (LAD): The LAD is diffusely diseased throughout the proximal and mid aspects of the vessel. There is mild associated calcification. The proximal vessel has tandem 50% lesions. The mid vessel has 75% stenosis that does not significantly change with intracoronary nitroglycerin. Further down in the mid vessel and distal LAD there is no significant obstructive  disease. The diagonal branch is occluded and it fills from graft flow.  Left circumflex (LCx): The left circumflex is large in caliber. There are 2 obtuse marginals without significant disease. There is no obstructive disease throughout the course of the left circumflex distribution.  Right coronary artery (RCA): The right coronary artery is dominant. The vessel has 30% proximal stenosis and only minor irregularity through the mid vessel. The distal RCA is widely patent. The PDA and PLA branches are patent.  Saphenous vein graft to diagonal: The graft is diffusely small but there are no areas of significant stenosis. There is diffuse mild irregularity. The distal anastomotic site has a 30-40% stenosis.  LIMA to LAD: Patent but atretic.  Left ventriculography: Left ventricular systolic function is normal, LVEF is estimated at 65%, there is no significant mitral regurgitation   Final Conclusions:   1. Moderately severe diffuse proximal LAD stenosis, not significantly changed from 2009. 2. Widely patent left circumflex and right coronary arteries 3. Status post aortocoronary bypass surgery with continued patency of the saphenous vein graft to diagonal, but atretic LIMA to LAD 4. Normal left ventricular systolic function 5. Normal cardiac hemodynamics  Recommendations: Continued medical therapy. The patient's films were carefully reviewed and compared with her previous studies in 2009. There is no significant change in the appearance of her LAD and I think her overall cardiac situation is stable. Will continue with her current medical therapy. Her films were reviewed with Dr. Riley Kill.   Tonny Bollman 07/10/2012, 9:59 AM

## 2012-07-10 NOTE — CV Procedure (Signed)
   Cardiac Catheterization Procedure Note  Name: [name] MRN: [id number] DOB: 1940-03-17  Procedure: Left Heart Cath, Selective Coronary Angiography, LV angiography  Indication: Moderate risk stress Myoview, exertional angina, Known CAD with prior MI and remote PCI/brachytherapy of the LAD.  Procedural details: The right groin was prepped, draped, and anesthetized with 1% lidocaine. Using modified Seldinger technique, a 4 French sheath was introduced into the right femoral artery. Standard Judkins catheters were used for coronary angiography and left ventriculography. Catheter exchanges were performed over a guidewire. There were no immediate procedural complications. The patient was transferred to the post catheterization recovery area for further monitoring.  Procedural Findings: Hemodynamics:  AO 175/70 mean 114 LV 176/25   Coronary angiography: Coronary dominance: right  Left mainstem: The left main is widely patent. There is no obstructive disease.  Left anterior descending (LAD): The LAD is diffusely diseased. The proximal LAD as 50% stenosis at the first septal perforator. There is moderate calcification in this area. There is a stent in the mid LAD after a small diagonal branch. The stent has diffuse 60-70% in-stent restenosis, most notable at the proximal portion of the stent. This is the area of previous brachii therapy. The distal stent only has mild in-stent restenosis of about 30%. The entire mid and distal LAD are small without focal stenosis.  Left circumflex (LCx): The left circumflex is large in caliber. There is at least moderate calcification. The intermediate branch is small in caliber without significant stenosis. The first obtuse marginal branch is large in caliber without significant stenosis. The left circumflex is dominant as it extends down the AV groove to supply left PDA branch without significant stenosis.  Right coronary artery (RCA): The right coronary  artery is small and nondominant. The proximal vessel has 75% stenosis. This vessel supplies only an RV marginal branch   Left ventriculography: Left ventricular systolic function is moderately reduced. There is severe hypokinesis of the inferoapical and periapical walls. The basal anterior wall and basal inferior wall contract normally. The mid anterior wall is mildly hypokinetic. The estimated left ventricular ejection fraction is 35-40%. There appears to be mild to moderate mitral regurgitation assessment is complicated by ventricular ectopy.   Final Conclusions:   1. Moderate diffuse proximal LAD stenosis with in-stent restenosis 2. Nonobstructive stenosis of a large, dominant left circumflex 3. Moderately severe stenosis of a small, nondominant RCA 4. Moderate LV systolic dysfunction  Recommendations: Continued medical management.  Tonny Bollman 07/10/2012, 10:44 AM

## 2012-07-10 NOTE — Progress Notes (Signed)
Bedrest begins @ 1055, tegaderm dressing applied to right groin site by Venda Rodes, site level 0.

## 2012-07-12 NOTE — Interval H&P Note (Signed)
History and Physical Interval Note:  07/12/2012 11:09 AM  [name]  has presented today for surgery, with the diagnosis of CAD, abnormal myoview  The various methods of treatment have been discussed with the patient and family. After consideration of risks, benefits and other options for treatment, the patient has consented to  Procedure(s): JV LEFT HEART CATHETERIZATION WITH CORONARY ANGIOGRAM (N/A) as a surgical intervention .  The patient's history has been reviewed, patient examined, no change in status, stable for surgery.  I have reviewed the patient's chart and labs.  Questions were answered to the patient's satisfaction.     Tonny Bollman

## 2012-07-12 NOTE — H&P (View-Only) (Signed)
Clinical Summary [name] is a 72 y.o.female seen recently in the office in mid April. She was reporting progressive angina symptoms at that time and was referred for followup testing.  Lexiscan Myoview was abnormal, overall intermediate risk. There were no diagnostic ST segment changes. Scar was noted within the inferior apical, inferior septal, and anteroseptal walls. Mild degree of peri-infarct ischemia noted in the anteroapical distribution. LVEF was reduced at 40% with wall motion abnormalities.  She is here with one of her daughters today. We reviewed test results and discussed the implications. She almost certainly has had progression in CAD over the last decade, now has also evidence of decreased LV function with her progressive angina symptoms. We discussed proceeding to a diagnostic cardiac catheterization to reassess coronary anatomy and determine if there any viable revascularization options. Hopefully there will be something that can be addressed percutaneously as I suspect her candidacy for CABG would be overall poor.  We discussed the risks and benefits, and she is in agreement to proceed.   Allergies  Allergen Reactions  . Codeine     vomiting  . Terbutaline Sulfate     REACTION: INTOL to terbutaline    Current Outpatient Prescriptions  Medication Sig Dispense Refill  . acetaminophen (TYLENOL) 500 MG tablet Take 500 mg by mouth every 6 (six) hours as needed. pain      . albuterol (PROVENTIL) (2.5 MG/3ML) 0.083% nebulizer solution Take 3 mLs (2.5 mg total) by nebulization 4 (four) times daily.  75 mL  11  . ALPRAZolam (XANAX) 0.5 MG tablet TAKE ONE-HALF TO ONE TABLET BY MOUTH THREE TIMES DAILY AS NEEDED  90 tablet  5  . Alum & Mag Hydroxide-Simeth (MAGIC MOUTHWASH) SOLN 1 tsp gargle and swallow four times daily as needed for sore throat  120 mL  5  . aspirin 81 MG tablet Take 81 mg by mouth daily.      Marland Kitchen atenolol (TENORMIN) 50 MG tablet Take 1 tablet (50 mg total) by  mouth daily.  30 tablet  11  . BESIVANCE 0.6 % SUSP Place 1 drop into both eyes 4 (four) times daily.      . budesonide (PULMICORT) 0.25 MG/2ML nebulizer solution Take 2 mLs (0.25 mg total) by nebulization 2 (two) times daily.  60 mL  11  . calcium-vitamin D (OSCAL WITH D) 500-200 MG-UNIT per tablet Take 1 tablet by mouth 2 (two) times daily.        . Cholecalciferol (VITAMIN D3) 1000 UNITS CAPS Take by mouth daily.        Marland Kitchen dextromethorphan-guaiFENesin (MUCINEX DM) 30-600 MG per 12 hr tablet Take 2 tablets by mouth every 12 (twelve) hours.        . DUREZOL 0.05 % EMUL Place 1 drop into both eyes 4 (four) times daily.      Marland Kitchen etodolac (LODINE) 400 MG tablet TAKE ONE TABLET BY MOUTH EVERY 12 HOURS  60 tablet  3  . ferrous sulfate 325 (65 FE) MG tablet Take 325 mg by mouth daily with breakfast.       . fluticasone (FLONASE) 50 MCG/ACT nasal spray USE TWO SPRAY EACH NOSTRIL TWICE DAILY  16 g  5  . furosemide (LASIX) 40 MG tablet TAKE ONE TABLET BY MOUTH EVERY DAY  30 tablet  5  . gabapentin (NEURONTIN) 100 MG capsule Take 3 capsules by mouth at bedtime  100 capsule  6  . ibandronate (BONIVA) 150 MG tablet TAKE 1 TABLET BY MOUTH ONCE A  MONTH  1 tablet  11  . insulin NPH-insulin regular (HUMULIN 70/30) (70-30) 100 UNIT/ML injection Inject 30 Units into the skin 2 (two) times daily.      Marland Kitchen ipratropium (ATROVENT) 0.02 % nebulizer solution Take 2.5 mLs (500 mcg total) by nebulization 4 (four) times daily.  75 mL  11  . losartan (COZAAR) 50 MG tablet TAKE ONE TABLET BY MOUTH EVERY DAY  30 tablet  6  . metFORMIN (GLUCOPHAGE) 500 MG tablet TAKE ONE TABLET BY MOUTH TWICE DAILY  60 tablet  5  . nitroGLYCERIN (NITROSTAT) 0.4 MG SL tablet Place 1 tablet (0.4 mg total) under the tongue every 5 (five) minutes as needed. May repeat x3  25 tablet  6  . omeprazole (PRILOSEC) 20 MG capsule TAKE ONE CAPSULE BY MOUTH EVERY DAY 30 MINUTES BEFORE A MEAL  30 capsule  10  . Probiotic Product (PROBIOTIC DAILY PO) Take 1  tablet by mouth daily.       Marland Kitchen PROLENSA 0.07 % SOLN Place 1 drop into both eyes 4 (four) times daily.      . simvastatin (ZOCOR) 80 MG tablet TAKE ONE TABLET BY MOUTH AT BEDTIME  30 tablet  6  . traMADol (ULTRAM) 50 MG tablet TAKE ONE TABLET BY MOUTH EVERY 8 HOURS AS NEEDED FOR PAIN  90 tablet  0  . vitamin B-12 (CYANOCOBALAMIN) 500 MCG tablet Take 1,000 mcg by mouth daily.        No current facility-administered medications for this visit.    Past Medical History  Diagnosis Date  . COPD (chronic obstructive pulmonary disease)   . Hyperlipidemia   . Essential hypertension, benign   . Anemia   . Anxiety   . Arthritis   . Coronary atherosclerosis of native coronary artery     PTCA/BMS LAD 2000, PTCA/brachytherapy LAD 2001, LVEF 55%  . Type 2 diabetes mellitus   . Osteoporosis   . Venous insufficiency   . Obesity   . Myocardial infarction     AMI 2000  . Asthma   . Sleep apnea     Past Surgical History  Procedure Laterality Date  . Total abdominal hysterectomy    . Cholecystectomy    . Rotator cuff repair Right   . Cataract extraction w/phaco Left 06/30/2012    Procedure: CATARACT EXTRACTION PHACO AND INTRAOCULAR LENS PLACEMENT (IOC);  Surgeon: Gemma Payor, MD;  Location: [facility];  Service: Ophthalmology;  Laterality: Left;  CDE:12.32    Family History  Problem Relation Age of Onset  . Coronary artery disease Other     Social History [name] reports that she quit smoking about 14 years ago. Her smoking use included Cigarettes. She has a 30 pack-year smoking history. She does not have any smokeless tobacco history on file. [name] reports that she does not drink alcohol.  Review of Systems No reported bleeding problems. Stable shortness of breath with continued intermittent angina symptoms that have been progressive over the last several months. She is using her oxygen regularly. Otherwise negative.  Physical Examination Filed Vitals:   07/04/12 1522  BP: 139/78    Pulse: 71   Filed Weights   07/04/12 1522  Weight: 205 lb (92.987 kg)    No acute distress. Wearing oxygen via nasal cannula.  HEENT: Conjunctiva and lids normal, oropharynx with moist mucosa.  Neck: Supple, no elevated JVP or bruits.  Lungs: Diminished breath sounds throughout, Nonlabored. No wheezing  Cardiac: Regular rate and rhythm with occasional ectopic beat,  no S3 gallop or rub.  Abdomen: Soft, nontender, bowel sounds present.  Skin: Warm and dry.  Extremities: No pitting edema.  Musculoskeletal: No kyphosis.  Neuropsychiatric: Alert and oriented x3, affect appropriate.   Problem List and Plan   CORONARY ATHEROSCLEROSIS NATIVE CORONARY ARTERY Progressive angina symptoms on medical therapy with previous history of interventions to the LAD as outlined above, now status post abnormal Myoview demonstrating mixed scar and ischemia with reduction in LVEF of 40%. She almost certainly has had progression in CAD, and question now is whether there are any viable revascularization options. We have discussed this, and plan will be to proceed with cardiac catheterization, risks and benefits discussed. Procedure is being scheduled with Dr. Excell Seltzer for next week.  Cardiomyopathy, ischemic LVEF 40% by recent Myoview.  COPD Severe, oxygen dependent, followed by Dr. Kriste Basque. Suspect that she would be a poor candidate for CABG overall.  Essential hypertension, benign No change in current regimen.  HYPERLIPIDEMIA She continues on Zocor, LDL has been well controlled in general.    Jonelle Sidle, M.D., F.A.C.C.

## 2012-07-16 ENCOUNTER — Encounter: Payer: Self-pay | Admitting: Pulmonary Disease

## 2012-07-16 ENCOUNTER — Other Ambulatory Visit (INDEPENDENT_AMBULATORY_CARE_PROVIDER_SITE_OTHER): Payer: Medicare Other

## 2012-07-16 ENCOUNTER — Ambulatory Visit (INDEPENDENT_AMBULATORY_CARE_PROVIDER_SITE_OTHER): Payer: Medicare Other | Admitting: Pulmonary Disease

## 2012-07-16 VITALS — BP 126/82 | HR 76 | Temp 98.0°F | Ht 64.5 in | Wt 200.6 lb

## 2012-07-16 DIAGNOSIS — F411 Generalized anxiety disorder: Secondary | ICD-10-CM

## 2012-07-16 DIAGNOSIS — J4489 Other specified chronic obstructive pulmonary disease: Secondary | ICD-10-CM

## 2012-07-16 DIAGNOSIS — M1990 Unspecified osteoarthritis, unspecified site: Secondary | ICD-10-CM

## 2012-07-16 DIAGNOSIS — D649 Anemia, unspecified: Secondary | ICD-10-CM

## 2012-07-16 DIAGNOSIS — I10 Essential (primary) hypertension: Secondary | ICD-10-CM

## 2012-07-16 DIAGNOSIS — E669 Obesity, unspecified: Secondary | ICD-10-CM

## 2012-07-16 DIAGNOSIS — K219 Gastro-esophageal reflux disease without esophagitis: Secondary | ICD-10-CM

## 2012-07-16 DIAGNOSIS — I872 Venous insufficiency (chronic) (peripheral): Secondary | ICD-10-CM

## 2012-07-16 DIAGNOSIS — I2510 Atherosclerotic heart disease of native coronary artery without angina pectoris: Secondary | ICD-10-CM

## 2012-07-16 DIAGNOSIS — M810 Age-related osteoporosis without current pathological fracture: Secondary | ICD-10-CM

## 2012-07-16 DIAGNOSIS — E119 Type 2 diabetes mellitus without complications: Secondary | ICD-10-CM

## 2012-07-16 DIAGNOSIS — E785 Hyperlipidemia, unspecified: Secondary | ICD-10-CM

## 2012-07-16 DIAGNOSIS — J449 Chronic obstructive pulmonary disease, unspecified: Secondary | ICD-10-CM

## 2012-07-16 LAB — BASIC METABOLIC PANEL
BUN: 19 mg/dL (ref 6–23)
CO2: 32 mEq/L (ref 19–32)
Calcium: 9.4 mg/dL (ref 8.4–10.5)
Chloride: 100 mEq/L (ref 96–112)
Creatinine, Ser: 1.2 mg/dL (ref 0.4–1.2)

## 2012-07-16 LAB — HEMOGLOBIN A1C: Hgb A1c MFr Bld: 7.3 % — ABNORMAL HIGH (ref 4.6–6.5)

## 2012-07-16 LAB — HEPATIC FUNCTION PANEL
ALT: 16 U/L (ref 0–35)
Albumin: 3.8 g/dL (ref 3.5–5.2)
Total Protein: 6.5 g/dL (ref 6.0–8.3)

## 2012-07-16 LAB — TSH: TSH: 2.09 u[IU]/mL (ref 0.35–5.50)

## 2012-07-16 LAB — LIPID PANEL
Cholesterol: 134 mg/dL (ref 0–200)
Triglycerides: 227 mg/dL — ABNORMAL HIGH (ref 0.0–149.0)

## 2012-07-16 NOTE — Patient Instructions (Addendum)
Today we updated your med list in our EPIC system...    Continue your current medications the same...  Today we did your follow up FASTING blood work...    We will contact you w/ the results when available...   Keep up the good work w/ DIET & EXERCISE!!!  Call for any questions...  Let's plan a follow up visit in 68mo, sooner if needed for problems.Marland KitchenMarland Kitchen

## 2012-07-16 NOTE — Progress Notes (Signed)
Subjective:    Patient ID: [name], female    DOB: [birth year], 72 y.o.   MRN: [id number]  HPI 72 y/o WF here for a follow up visit... [name] has 13 grandchildren & 18 great-grands!  She also has mult med problems including severe COPD; HBP; CAD w/prev MI & PTCA followed by DrMcDowell; VI; Hyperlipidemia; DM on insulin; Obesity; GERD/ constipation; DJD/ osteoporosis; Anxiety...  ~  February 06, 2011:  32mo ROV & she is c/o some head & chest congestion, cough w/ thick brown sputum, sl moe SOB, denies f/c/s, denies CP, etc;  We reviewed meds> NEBS regularly, Mucinex, Fluids, & she wants ZPak for infection- ok...  She requests refills, declines Flu shot...  Labs today w/ Chol ok but TG 221 & needs better diet; BS 237, A1c 8.0 is actually sl better on Novolin 70/30 taking 25uBid + Metformin 500Bid (we reviewed recs for slowly increasing dose)...    She saw DrMcDowell for Cards f/u 10/12> stable w/ longstanding CAD, prev PTCA, not having angina, mobility limited, & he rec continued med rx & observation...    She saw DrAplington 8/12 w/ LBP spondylosis & spinal stenosis> given PT, poor operative risk, wt loss will help, trial TENS, continue meds and Neurontin...  ~  June 07, 2011:  32mo ROV & [name] notes "I'm not doin" c/o depressed (Mother died recently) & angry at [facility]- wants another DME company;  States that she's hurting all over all the time...    She has severe COPD on O2, NEBS w/ AlbutQid & BudesBid, Mucinex, etc; breathing is at baseline DOE w/ ADLs & no real change...    BP controlled on 3 med regimen & she denies CP, palpit, dizzy/syncope, edema, etc...    Lipids under control w/ Simva80 & tol well; FLP 12/12 was ok x for elev TG & we reviewed low fat diet restriction... LABS 4/13:  Chems- ok x BS=202 A1c=7.9;  CBC- ok x Hg=11.6  ~  October 12, 2011:  32mo ROV & [name] is c/o head congestion, drainage, cough & thick whitish sput; denies fever, chills, sweats, discolored phlegm or hemoptysis; she has NEBS w/  Duoneb, Pulmicort, Mucinex, Flonase, MMW.; she is very distressed about being "stopped up" & wants ENT assessment so we will refer ==> she saw DrTeoh 9/13> chr nasal congestion, freq sore throats, hoarseness, post nasal drip; she had edematous mucosa, nasal turbinate hypertrophy, but no infection/ poilyps/ mass & latynx was ok; Rx w/ Saline irrigations, continue other meds & add Atrovent nasal 2spBid...    HBP controlled on Aten, Losar, Lasix; BP= 118/70 & she denies CP, palpit, change in SOB or edema...    CAD w/ prev MI & PTCAs folowed by DrMcDowell in [place] on above meds + ASA...    Dyslipidemia on Zocor80 w/ Chol numbers at goals but Tg still elev & she's been instructed on low fat diet as well...    DM treated w/ Metform & 70/30 insulin currently taking 28u Bid...    She remains obese & is disappointed w/ her results on diet; wt= 206# & BMI~35; we reviewed diet, exercise, wt reduction strategies... LABS 8/13:  FLP- chol ok on Simva80 but TG=200;  Chems- ok x BS=157 A1c=7.8 on Metform+Insulin;  CBC- ok x Hg=11.6, Fe=59;  TSH=1.35;  VitD=38  ~  February 26, 2012:  32mo ROV & [name] is again c/o nasal congestion, thick phlegm in throat, cough w/ clear sput, on meds below; she had CT maxillofacial 9/13 w/ no  signif sinus opacity noted, there was a socket left by right mandib wisdom tooth extraction w/ inflamm (tooth extracted 6/13); treated w/ PenVK, Vicodin, rec ret to Dentist...    HxSinusitis> on MMW, Flonase, OTC antihist prn; c.o chr congestion, no recent infection...    COPD> on O2, NEBS w/ Duoneb/ Budes, Mucinex2Bid; stable & denies ch in cough/ sput/ SOB, etc...    HBP> on Aten50, Losar50, Lasix40; labs 8/13 w/ normal lytes & renal; BP= 126/70 & she denies CP, palpit, ch in SOB, edema, etc...    CAD> on ASA81, NTG prn; followed by DrMcDowell- seen 10/13 & stable, no changes made...    VI> stable on low sodium diet, Lasix40, elevation, support hose...    Hyperlipid> on Simva80; last FLP 8/13  showed TChol 125, TG 200, HDL 39, LDL 46    DM> on 70/30 insulin 30Bid, Metform500Bid; doing well she says w/ BS=101 today at home; labs here 8/13 showed BS=157, A1c=7.8    Obesity> wt is 207# which is up 2# from last visit; we reviewed diet, exercise, wt reduction strategies...    GI- GERD, Constip> on Omep20; hx gastritis & prepyloric ulcers on EGD 3/12, rec to stay on the PPI Rx; last colon 2007 by DrJacobs was neg...    DJD> on Lodine400Bid prn, Tramadol50Tid prn, folowed by [facility]...    Osteoporosis> on Boniva150, clacium, MVI, VitD1000; labs 8/13 showed VitD=38; last BMD 5/13 w/ TScore -1.6 in hips...    Neuropathy> on Neurontin100Tid...    Anxiety> on Xanax0.5mg  prn...    Borderline Anemia> on Fe, B12 1089mcg/d; labs 8/13 showed Hg=11.6, MCV=93, Fe=59 (18%sat),  We reviewed prob list, meds, xrays and labs> see below for updates >>   ~  Jul 16, 2012:  4-18mo ROV & post hosp check> [name] was eval recently by Cards for CP & an abn Myoview- Cath 5/14 revealed mod diffuse LAD disease w/ 50%proxLAD stenosis w/ a 60-70% in-stent restenosis in the midLAD; RCA was small w/ a 75% prox stenosis; LCx was dominant w/o focal stenosis but w/ mod diffuse calcium; HK of inferoapical wall w/ EF= 35-40% and mild-mod MR (Rec was for continued medical management)... We reviewed the following medical problems during today's office visit >>     HxSinusitis> on MMW, Flonase, OTC antihist prn; c/o chr congestion, no recent infection...    COPD> on O2, NEBS w/ Duoneb/ Budes, Mucinex2Bid; stable w/o exac & denies ch in cough/ sput/ SOB, etc...    HBP> on Aten50, Losar50, Lasix40; labs 5/14 w/ normal lytes & renal; BP= 126/82 & she denies CP, palpit, ch in SOB, edema, etc...    CAD> on ASA81, NTG prn; followed by DrMcDowell- seen 5/14 w/ abn Myoview & subseq Cath- AS ABOVE, med rx...    VI> stable on low sodium diet, Lasix40, elevation, support hose...    Hyperlipid> on Simva80; FLP 5/14 shows TChol 134, TG 227, HDL  33, LDL 74... Advised better low fat diet, exercise, & get wt down...    DM> on 70/30 insulin 30Bid, Metform500Bid; doing well she says; labs today showed BS=160, A1c=7.3 (sl improved- continue same + better diet)...    Obesity> wt is 201# which is down 6# from last visit; we reviewed diet, exercise, wt reduction strategies...    GI- GERD, Constip> on Omep20 & Probiotic; hx gastritis & prepyloric ulcers on EGD 3/12, rec to stay on the PPI Rx; last colon 2007 by DrJacobs was neg...    DJD> on Lodine400Bid prn, Tramadol50Tid prn, folowed  by Alinda Sierras...    Osteoporosis> on Boniva150, calcium, MVI, VitD1000; labs 8/13 showed VitD=38; last BMD 5/13 w/ TScore -1.6 in hips...    Neuropathy> on Neurontin300Qhs...    Anxiety> on Xanax0.5mg  prn...    Hx Borderline Anemia> on Fe, B12 1035mcg/d; labs 5/14 showed Hg=13.2, MCV=90, last Fe was 8/13 = 59 (18%sat),  We reviewed prob list, meds, xrays and labs> see below for updates >>  LABS 5/14:  FLP- not at goals w/ TG=227, HDL=33;  Chems- ok x BS=160, A1c=7.3;  CBC- wnl;  TSH=2.09...         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 Rx'd w/ MUCINEX, Nasal Saline, FLONASE...   ~  2/11:  c/o sinusitis & AUGMENTIN perscribed. ~  9/13:  ER visit w/ jaw pain- had wisdom tooth extracted 6/13 by DrLewis in [place]; CT showed socket left by right mandib molar extraction w/ inflamm; given PenVK, Vicodin, told f/u w/ dentist...  COPD (ICD-496) - she has COPD and is an ex-smoker... controlled on NEBULIZER w/ Duoneb Qid + Budesonide Bid, MUCINEX, etc... she has end stage disease w/ severe dyspnea w/ ADLs and no change over the last yr... ~  baseline CXR = COPD, incr heart size, NAD. ~ CT Chest 2/02 showed COPD, Atx, sl scarring, NAD. ~  bronchoscopy 3/02 w/ mucous plugging, no endobr lesions. ~  PFT's in 12/07 showed FVC=0.81 (27%), FEV1=0.54 (22%), %1sec=67, mid-flows=13%pred. ~  CXR 1/10  showed chr changes, cardiomeg, lingular atx. ~  CXR 2/11 showed cardiomeg, bibasilar scarring, NAD. ~  CXR 2/12 showed cardiomeg, COPD,scarring left base, NAD. ~  CXR 5/14 showed cardiomeg, mild basilar atx/ flattened diaph/ NAD, DJD in Tspine, s/p chole...  HYPERTENSION (ICD-401.9) - controlled on ATENOLOL 50mg /d, LOSARTAN 50mg /d, LASIX 40mg /d... BP=126/70 and tol well... denies HA, visual changes, CP, palipit, dizziness, syncope, change in dyspnea, edema, etc. ~  Jan10: tried to change ACE to Diovan but too $$$ & requested change back to Lisinopril==> eventually changed to generic LOSARTAN...  CORONARY ARTERY DISEASE (ICD-414.00) - on ASA 81mg /d... hx of CAD w/ prev MI in 2000, and subseq PTCA's in 2000, & 2001... ~  last cath 2001 w/ PTCA to mid-LAD lesion w/ cutting balloon & brachytherapy for in-stent restenosis. ~  last hosp 6/08 w/ CP- felt to be non-cardiac... ~  last 2DECho 6/08 showed HK of inferoseptal area, EF=55%... ~  last NuclearStressTest 7/08 w/ mod ant/apical/septal infarct, apical HK, no ischemia, EF=?53%... ~  follow[facility]McDowell at [facility]- notes reviewed... ~  4/13: Note from DrMcDowell> compliant w/ meds but not exercising, unsuccessful w/ wt loss, CAD/ BP/ Lipids- stable, no changes made... ~  1013: she saw DrMcDowell- mild chest congestion, tooth infection, on O2, not exercising enough, no CP etc; no changes made... ~  EKG 10/13 showed NSR, rate74, IVCD, poor R prog V1-3, NSSTTWA... ~  EKG 4/14 showed NSR, rate66, LAD, LVH, poor R progression, NSSTTWA... ~  Myoview 4/14 showed scar inferoapically, mild peri-infarct ischemia, EF reduced to 40% w/ wall motion abn noted... ~  Cath 5/14> mod diffuse LAD disease w/ 50%proxLAD stenosis w/ a 60-70% in-stent restenosis in the midLAD; RCA was small w/ a 75% prox stenosis; LCx was dominant w/o focal stenosis but w/ mod diffuse calcium; HK of inferoapical wall w/ EF= 35-40% and mild-mod MR (Rec was for continued medical  management).  VENOUS INSUFFICIENCY (ICD-459.81) - low sodium diet, elevation, & Lasix 40-80mg /d... states she "  allergic" to TED hose! and refuses to wear them...   HYPERLIPIDEMIA (ICD-272.4) - on ZOCOR 80mg /d + FISH OIL 1000mg /d ... she has been counselled on diet & exercise but unsuccessful... ~  FLP 11/08 on Simva40 showed TChol 150, TG 185, HDL 35, LDL 78 ~  FLP 3/09 on Simva40 showed TChol 139, TG 193, HDL 30, LDL 70... DrMcDowell incr to Simva80 9/09. ~  FLP 2/10 on Simva80 showed TChol 155, TG 137, HDL 43, LDL 85 ~  FLP 10/10 showed TChol 129, TG 96, HDL 39, LDL 71 ~  FLP 8/11 showed TChol 130, TG 152, HDL 30, LDL 70 ~  FLP 2/12 showed TChol 139, TG 96, HDL 40, LDL 80 ~  FLP 12/12 on Simva80 showed TChol 154, TG 221, HDL 39, LDL 81... LFTs remain wnl; rec> better low fat diet... ~  FLP 8/13 on Simva80 showed TChol 125, TG 200, HDL 39, LDL 46 ~  FLP 5/14 on Simva80 showed TChol 134, TG 227, HDL 33, LDL 74.  DM (ICD-250.00) - on HUMULIN 70/30- now taking 25uAM & 25uPM she says & GLUCOPHAGE 500mg Bid (off prev Avandia)... ~  BS=142 & HgA1c=6.6 in Nov08... ~  Labs 3/09 showed BS= 112, HgA1c= 6.8 ~  labs 7/09 showed BS= 197, HgA1c= 7.1.Marland KitchenMarland Kitchen rec- better diet! ~  labs 2/10 showed BS= 134, A1c= 7.0 ~  labs 10/10 showed BS= 151, A1c= 6.9 ~  labs 2/11 showed BS= 239, A1c= 8.1.Marland KitchenMarland Kitchen pt instructed to up titrate insulin til BS= 120-150 range. ~  labs 8/11 showed BS= 215, A1c= 8.4... again instructed to incr insulin slowly & titrate to BS values. ~  labs 2/12 showed BS= 236, A1c= 8.8.Marland KitchenMarland Kitchen rec to incr 70/30 insulin incrementally... ~  Labs 8/12 showed BS= 199, A1c= 9.2.Marland KitchenMarland Kitchen rec slowly incr her 70/30 insulin doses as discussed. ~  Labs 12/12 on 70/30Insulin25uBid+Metform500Bid showed BS= 237, A1c= 8.0..Marland Kitchen We discussed incr to 30uAM & 30uPM... ~  Labs 8/13 on 70/30Insulin28uBid+Metform500Bid showed BS= 157, A1c= 7.8.Marland KitchenMarland Kitchen rec incr 70/30 insulin to 30uBid... ~  5/14: on 70/30 insulin 30Bid, Metform500Bid; doing  well she says; labs today showed BS=160, A1c=7.3 (sl improved- continue same + better diet).  OBESITY (ICD-278.00) - we discussed diet + exercise program required to lose weight! ~  weight 2008 = 235# ~  weight 2/10 = 250# ~  weight 10/10 = 258# ~  weight 2/11 = 241# ~  weight 8/11 = 217# ~  weight 2/12 = 215# ~  Weight 8/12 = 214# ~  Weight 12/12 = 209# ~  Weight 8/13 = 206# ~  Weight 12/13 = 207# ~  Weight 5/14 = 201#  GERD (ICD-530.81) - on PPI therapy (changed 2/10 per request to OMEPRAZOLE20mg /d)... ~  [facility] 3/12 for COPD exac & was anemic w/ heme pos stool & GIB; EGD by DrJacobs w/ gastritis & prepyloric ulcers, +HPylori- treated & improved.  CONSTIPATION (ICD-564.00) - last colonoscopy was 11/07 by DrJacobs and negative...  DEGENERATIVE JOINT DISEASE (ICD-715.90) - prev CSpine films w/ spondy and biforaminal narrowing... LSSpine films w/ DDD and osteopenia...  ~  she saw DrDrake, Ortho in [place], for shot in right foot. ~  9/11:  she saw DrNorris for shot in right knee. ~  8/12: presents c/o LBP, leg pain; XRay Lumbar spine w/ DDD, narrowing, osteophytes, facet dis, etc; Rx w/ rest, heat, TRAMADOL, ROBAXIN, refer to Ortho. ~  She has followed up w/ DrApplington> not a surg candidate for her lumbar spondylosis & sp stenosis...  OSTEOPOROSIS (ICD-733.00) -  on BONIVA 150mg /mo, Calcium, Vits... she needs a BMD... ~  labs 2/11 showed Vit D level = 27... OK to change to 1000 u daily. ~  labs 2/12 showed Vit D level = 37... rec to continue 1000u supplement ~  BMD 5/13 showed TScore -1.6 in hips & rec to continue Boniva, Calcium, MVI, VitD... ~  Labs 8/13 showed Vit D level = 38... Continue supplement.  ANXIETY (ICD-300.00) - uses Alprazolam 0.5mg  as needed...  ANEMIA, MILD (ICD-285.9) ~  labs 2/10 showed Hg= 11.9, Fe= 51... ~  labs 10/10 showed Hg= 11.9, Fe= 50... rec OTC Fe supplement. ~  labs 2/11 showed Hg= 13.4, Fe= 52 (14%sat) ~  labs 2/12 showed Hg= 13.1, MCV= 93 ~  Labs  8/12 showed Hg= 12.0, Fe= 61 (15%sat); rec to continue oral iron... ~  Labs 8/13 showed Hg= 11.6, Fe= 59; rec to continue oral iron supplement. ~  Labs 5/14 showed Hg= 13.2  Health Maintenance - she hasn't seen GYN in yrs and is asked to re-establish... prev refused Mammograms due to the discomfort & bruising- she will discuss w/ Gyn & get a follow up BMD as well...   Past Surgical History  Procedure Laterality Date  . Total abdominal hysterectomy    . Cholecystectomy    . Rotator cuff repair Right   . Cataract extraction w/phaco Left 06/30/2012    Procedure: CATARACT EXTRACTION PHACO AND INTRAOCULAR LENS PLACEMENT (IOC);  Surgeon: Gemma Payor, MD;  Location: [facility];  Service: Ophthalmology;  Laterality: Left;  CDE:12.32    Outpatient Encounter Prescriptions as of 07/16/2012  Medication Sig Dispense Refill  . acetaminophen (TYLENOL) 500 MG tablet Take 500 mg by mouth every 6 (six) hours as needed. pain      . albuterol (PROVENTIL) (2.5 MG/3ML) 0.083% nebulizer solution Take 3 mLs (2.5 mg total) by nebulization 4 (four) times daily.  75 mL  11  . ALPRAZolam (XANAX) 0.5 MG tablet TAKE ONE-HALF TO ONE TABLET BY MOUTH THREE TIMES DAILY AS NEEDED  90 tablet  5  . Alum & Mag Hydroxide-Simeth (MAGIC MOUTHWASH) SOLN 1 tsp gargle and swallow four times daily as needed for sore throat  120 mL  5  . aspirin 81 MG tablet Take 81 mg by mouth daily.      Marland Kitchen atenolol (TENORMIN) 50 MG tablet Take 1 tablet (50 mg total) by mouth daily.  30 tablet  11  . BESIVANCE 0.6 % SUSP Place 1 drop into both eyes 4 (four) times daily.      . budesonide (PULMICORT) 0.25 MG/2ML nebulizer solution Take 2 mLs (0.25 mg total) by nebulization 2 (two) times daily.  60 mL  11  . calcium-vitamin D (OSCAL WITH D) 500-200 MG-UNIT per tablet Take 1 tablet by mouth 2 (two) times daily.        . Cholecalciferol (VITAMIN D3) 1000 UNITS CAPS Take by mouth daily.        Marland Kitchen dextromethorphan-guaiFENesin (MUCINEX DM) 30-600 MG per 12 hr  tablet Take 2 tablets by mouth every 12 (twelve) hours.        . DUREZOL 0.05 % EMUL Place 1 drop into both eyes 4 (four) times daily.      Marland Kitchen etodolac (LODINE) 400 MG tablet TAKE ONE TABLET BY MOUTH EVERY 12 HOURS  60 tablet  3  . ferrous sulfate 325 (65 FE) MG tablet Take 325 mg by mouth daily with breakfast.       . fluticasone (FLONASE) 50 MCG/ACT nasal spray  USE TWO SPRAY EACH NOSTRIL TWICE DAILY  16 g  5  . furosemide (LASIX) 40 MG tablet TAKE ONE TABLET BY MOUTH EVERY DAY  30 tablet  5  . gabapentin (NEURONTIN) 100 MG capsule Take 3 capsules by mouth at bedtime  100 capsule  6  . ibandronate (BONIVA) 150 MG tablet TAKE 1 TABLET BY MOUTH ONCE A MONTH  1 tablet  11  . insulin NPH-insulin regular (HUMULIN 70/30) (70-30) 100 UNIT/ML injection Inject 30 Units into the skin 2 (two) times daily.      Marland Kitchen ipratropium (ATROVENT) 0.02 % nebulizer solution Take 2.5 mLs (500 mcg total) by nebulization 4 (four) times daily.  75 mL  11  . losartan (COZAAR) 50 MG tablet TAKE ONE TABLET BY MOUTH EVERY DAY  30 tablet  6  . metFORMIN (GLUCOPHAGE) 500 MG tablet TAKE ONE TABLET BY MOUTH TWICE DAILY  60 tablet  5  . nitroGLYCERIN (NITROSTAT) 0.4 MG SL tablet Place 1 tablet (0.4 mg total) under the tongue every 5 (five) minutes as needed. May repeat x3  25 tablet  6  . omeprazole (PRILOSEC) 20 MG capsule TAKE ONE CAPSULE BY MOUTH EVERY DAY 30 MINUTES BEFORE A MEAL  30 capsule  10  . Probiotic Product (PROBIOTIC DAILY PO) Take 1 tablet by mouth daily.       Marland Kitchen PROLENSA 0.07 % SOLN Place 1 drop into both eyes 4 (four) times daily.      . simvastatin (ZOCOR) 80 MG tablet TAKE ONE TABLET BY MOUTH AT BEDTIME  30 tablet  6  . traMADol (ULTRAM) 50 MG tablet TAKE ONE TABLET BY MOUTH EVERY 8 HOURS AS NEEDED FOR PAIN  90 tablet  0  . vitamin B-12 (CYANOCOBALAMIN) 500 MCG tablet Take 1,000 mcg by mouth daily.        No facility-administered encounter medications on file as of 07/16/2012.     Allergies  Allergen Reactions   . Codeine     vomiting  . Terbutaline Sulfate     REACTION: INTOL to terbutaline    Current Medications, Allergies, Past Medical History, Past Surgical History, Family History, and Social History were reviewed in [employer] record.    Review of Systems         See HPI - all other systems neg except as noted...   The patient complains of dyspnea on exertion, peripheral edema, muscle weakness, and difficulty walking.  The patient denies anorexia, fever, weight loss, weight gain, vision loss, decreased hearing, hoarseness, chest pain, syncope, prolonged cough, headaches, hemoptysis, abdominal pain, melena, hematochezia, severe indigestion/heartburn, hematuria, incontinence, suspicious skin lesions, transient blindness, depression, unusual weight change, abnormal bleeding, enlarged lymph nodes, and angioedema.    Objective:   Physical Exam    WD, Obese, 72 y/o WF in NAD... GENERAL:  Alert & oriented; pleasant & cooperative...  HEENT:  [place]/AT, EOM-wnl, PERRLA, EACs-clear, TMs-wnl, NOSE-clear, THROAT-clear & wnl. NECK:  Supple w/ fairROM; no JVD; normal carotid impulses w/o bruits; no thyromegaly or nodules palpated; no lymphadenopathy. CHEST:  Decr BS bilat, few scat rhonchi, without wheezing, rales, or rubs...upper airway psuedo-wheezing HEART:  Regular Rhythm;  gr 1/6 SEM without rubs or gallops detected... ABDOMEN:  Obese w/ panniculus, soft & nontender; normal bowel sounds; no organomegaly or masses detected. EXT: without deformities, mild arthritic changes; no varicose veins/ +venous insuffic/ tr edema. Sl tender on palpation over lumbar area & siatic notch... NEURO:  CN's intact; no focal neuro deficits... DERM:  no lesion  seen.Marland KitchenMarland Kitchen  RADIOLOGY DATA:  Reviewed in the EPIC EMR & discussed w/ the patient...  LABORATORY DATA:  Reviewed in the EPIC EMR & discussed w/ the patient...   Assessment & Plan:    COPD>  Severe, continue home Oxygen, Nebulizer,  Mucinex, & Fluids;  She will also continue vigorous nasal regimen as discussed...  HBP>  Good control on Atenolol50, Losartan50, Furosemide40; discussed diet, must get wt down, no sodium...  CAD>  Followed by DrMcDowell in [place] office- recent cath reviewed, no option for surg or intervention, continue medical management...  HYPERLIPID>  On Simva80 + Fish Oil; needs better diet & get wt down!  DM>  Control remains fair as she has been unable to diet effectively, lose weight, etc; now on 70/30 insulin taking 30u Bid & improved...  OBESITY>  Weight reduction is key to improvement in all areas...  GI> GERD, Ulcers, +HPylori, Constip>  Improved after HPylori Rx & back on Omep 20mg /d...  DJD, LBP, etc>  Lumbar XRay reviewed, trial Tramadol, Robaxin, rest/ heat/ etc... Refer to Ortho for further eval & Rx...  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  Anxiety>  Alprazolam for prn use- refilled...  Anemia>  On Fe daily and Hg is improved (s/p hosp w/ GIB)...   Patient's Medications  New Prescriptions   No medications on file  Previous Medications   ACETAMINOPHEN (TYLENOL) 500 MG TABLET    Take 500 mg by mouth every 6 (six) hours as needed. pain   ALBUTEROL (PROVENTIL) (2.5 MG/3ML) 0.083% NEBULIZER SOLUTION    Take 3 mLs (2.5 mg total) by nebulization 4 (four) times daily.   ALPRAZOLAM (XANAX) 0.5 MG TABLET    TAKE ONE-HALF TO ONE TABLET BY MOUTH THREE TIMES DAILY AS NEEDED   ALUM & MAG HYDROXIDE-SIMETH (MAGIC MOUTHWASH) SOLN    1 tsp gargle and swallow four times daily as needed for sore throat   ASPIRIN 81 MG TABLET    Take 81 mg by mouth daily.   ATENOLOL (TENORMIN) 50 MG TABLET    Take 1 tablet (50 mg total) by mouth daily.   BESIVANCE 0.6 % SUSP    Place 1 drop into both eyes 4 (four) times daily.   BUDESONIDE (PULMICORT) 0.25 MG/2ML NEBULIZER SOLUTION    Take 2 mLs (0.25 mg total) by nebulization 2 (two) times daily.   CALCIUM-VITAMIN D  (OSCAL WITH D) 500-200 MG-UNIT PER TABLET    Take 1 tablet by mouth 2 (two) times daily.     CHOLECALCIFEROL (VITAMIN D3) 1000 UNITS CAPS    Take by mouth daily.     DEXTROMETHORPHAN-GUAIFENESIN (MUCINEX DM) 30-600 MG PER 12 HR TABLET    Take 2 tablets by mouth every 12 (twelve) hours.     DUREZOL 0.05 % EMUL    Place 1 drop into both eyes 4 (four) times daily.   ETODOLAC (LODINE) 400 MG TABLET    TAKE ONE TABLET BY MOUTH EVERY 12 HOURS   FERROUS SULFATE 325 (65 FE) MG TABLET    Take 325 mg by mouth daily with breakfast.    FLUTICASONE (FLONASE) 50 MCG/ACT NASAL SPRAY    USE TWO SPRAY EACH NOSTRIL TWICE DAILY   FUROSEMIDE (LASIX) 40 MG TABLET    TAKE ONE TABLET BY MOUTH EVERY DAY   GABAPENTIN (NEURONTIN) 100 MG CAPSULE    Take 3 capsules by mouth at bedtime   IBANDRONATE (BONIVA) 150 MG TABLET    TAKE 1  TABLET BY MOUTH ONCE A MONTH   INSULIN NPH-INSULIN REGULAR (HUMULIN 70/30) (70-30) 100 UNIT/ML INJECTION    Inject 30 Units into the skin 2 (two) times daily.   IPRATROPIUM (ATROVENT) 0.02 % NEBULIZER SOLUTION    Take 2.5 mLs (500 mcg total) by nebulization 4 (four) times daily.   LOSARTAN (COZAAR) 50 MG TABLET    TAKE ONE TABLET BY MOUTH EVERY DAY   METFORMIN (GLUCOPHAGE) 500 MG TABLET    TAKE ONE TABLET BY MOUTH TWICE DAILY   NITROGLYCERIN (NITROSTAT) 0.4 MG SL TABLET    Place 1 tablet (0.4 mg total) under the tongue every 5 (five) minutes as needed. May repeat x3   OMEPRAZOLE (PRILOSEC) 20 MG CAPSULE    TAKE ONE CAPSULE BY MOUTH EVERY DAY 30 MINUTES BEFORE A MEAL   PROBIOTIC PRODUCT (PROBIOTIC DAILY PO)    Take 1 tablet by mouth daily.    PROLENSA 0.07 % SOLN    Place 1 drop into both eyes 4 (four) times daily.   SIMVASTATIN (ZOCOR) 80 MG TABLET    TAKE ONE TABLET BY MOUTH AT BEDTIME   TRAMADOL (ULTRAM) 50 MG TABLET    TAKE ONE TABLET BY MOUTH EVERY 8 HOURS AS NEEDED FOR PAIN   VITAMIN B-12 (CYANOCOBALAMIN) 500 MCG TABLET    Take 1,000 mcg by mouth daily.   Modified Medications   No  medications on file  Discontinued Medications   No medications on file

## 2012-07-18 ENCOUNTER — Ambulatory Visit: Payer: Medicare Other | Admitting: Pulmonary Disease

## 2012-07-23 ENCOUNTER — Encounter: Payer: Self-pay | Admitting: Physician Assistant

## 2012-07-23 ENCOUNTER — Ambulatory Visit (INDEPENDENT_AMBULATORY_CARE_PROVIDER_SITE_OTHER): Payer: Medicare Other | Admitting: Physician Assistant

## 2012-07-23 VITALS — BP 124/68 | HR 67 | Ht 63.5 in | Wt 204.0 lb

## 2012-07-23 DIAGNOSIS — I2589 Other forms of chronic ischemic heart disease: Secondary | ICD-10-CM

## 2012-07-23 DIAGNOSIS — I2510 Atherosclerotic heart disease of native coronary artery without angina pectoris: Secondary | ICD-10-CM

## 2012-07-23 DIAGNOSIS — I10 Essential (primary) hypertension: Secondary | ICD-10-CM

## 2012-07-23 DIAGNOSIS — I255 Ischemic cardiomyopathy: Secondary | ICD-10-CM

## 2012-07-23 MED ORDER — NITROGLYCERIN 0.4 MG SL SUBL: 0.4000 mg | SUBLINGUAL_TABLET | SUBLINGUAL | Status: AC | PRN

## 2012-07-23 MED ORDER — ISOSORBIDE MONONITRATE ER 30 MG PO TB24: 30.0000 mg | ORAL_TABLET | Freq: Every day | ORAL | Status: DC

## 2012-07-23 NOTE — Assessment & Plan Note (Addendum)
Cardiac Catheterization was performed on 07/10/12 and showed moderate diffuse proximal LAD stenosis with in-stent restenosis, nonobstructive stenosis of the large dominant circumflex, moderately severe stenosis of a small nondominant RCA, and moderate LV dysfunction. Continued medical management was recommended.  Will add Imdur 30 mg daily in hopes to curb her angina, renew sublingual nitroglycerin.

## 2012-07-23 NOTE — Assessment & Plan Note (Signed)
Stable without evidence of heart failure 

## 2012-07-23 NOTE — Addendum Note (Signed)
Addended by: Derry Lory A on: 07/23/2012 12:50 PM   Modules accepted: Orders

## 2012-07-23 NOTE — Assessment & Plan Note (Signed)
Stable

## 2012-07-23 NOTE — Patient Instructions (Addendum)
Your physician recommends that you schedule a follow-up appointment in: 3-4 months with SM  Your physician has recommended you make the following change in your medication:   1) START IMDUR 30MG  ONCE DAILY

## 2012-07-23 NOTE — Progress Notes (Signed)
HPI:    This is a 72 year old female patient of Dr. Simona Huh who has a history of coronary artery disease S/P PTCA/BMS LAD 2000, PTCA/brachytherapy LAD 2001,  LVEF 55%  who was having symptoms of progressive angina. Lexiscan Myoview showed some peri-infarct ischemia in the anteroapical distribution ejection fraction was reduced to 40%. Because of this cardiac catheterization was recommended. Cardiac Catheterization was performed on 07/10/12 and showed moderate diffuse proximal LAD stenosis with in-stent restenosis, nonobstructive stenosis of the large dominant circumflex, moderately severe stenosis of a small nondominant RCA, and moderate LV dysfunction. Continued medical management was recommended.  The patient has had some chest tightness since she's been home from the hospital. It usually occurs when she is up and moving around. It usually eases with rest occasionally she has used sublingual nitroglycerin. She has not used nitroglycerin since she's been home from the hospital. Overall she is feeling better than she has felt in a while.  Allergies:  -- Codeine    --  vomiting  -- Terbutaline Sulfate    --  REACTION: INTOL to terbutaline  Current Outpatient Prescriptions on File Prior to Visit: acetaminophen (TYLENOL) 500 MG tablet, Take 500 mg by mouth every 6 (six) hours as needed. pain, Disp: , Rfl:  albuterol (PROVENTIL) (2.5 MG/3ML) 0.083% nebulizer solution, Take 3 mLs (2.5 mg total) by nebulization 4 (four) times daily., Disp: 75 mL, Rfl: 11 ALPRAZolam (XANAX) 0.5 MG tablet, TAKE ONE-HALF TO ONE TABLET BY MOUTH THREE TIMES DAILY AS NEEDED, Disp: 90 tablet, Rfl: 5 Alum & Mag Hydroxide-Simeth (MAGIC MOUTHWASH) SOLN, 1 tsp gargle and swallow four times daily as needed for sore throat, Disp: 120 mL, Rfl: 5 aspirin 81 MG tablet, Take 81 mg by mouth daily., Disp: , Rfl:  atenolol (TENORMIN) 50 MG tablet, Take 1 tablet (50 mg total) by mouth daily., Disp: 30 tablet, Rfl: 11 BESIVANCE 0.6 %  SUSP, Place 1 drop into both eyes 4 (four) times daily., Disp: , Rfl:  budesonide (PULMICORT) 0.25 MG/2ML nebulizer solution, Take 2 mLs (0.25 mg total) by nebulization 2 (two) times daily., Disp: 60 mL, Rfl: 11 calcium-vitamin D (OSCAL WITH D) 500-200 MG-UNIT per tablet, Take 1 tablet by mouth 2 (two) times daily.  , Disp: , Rfl:  Cholecalciferol (VITAMIN D3) 1000 UNITS CAPS, Take by mouth daily.  , Disp: , Rfl:  dextromethorphan-guaiFENesin (MUCINEX DM) 30-600 MG per 12 hr tablet, Take 2 tablets by mouth every 12 (twelve) hours.  , Disp: , Rfl:  DUREZOL 0.05 % EMUL, Place 1 drop into both eyes 4 (four) times daily., Disp: , Rfl:  etodolac (LODINE) 400 MG tablet, TAKE ONE TABLET BY MOUTH EVERY 12 HOURS, Disp: 60 tablet, Rfl: 3 ferrous sulfate 325 (65 FE) MG tablet, Take 325 mg by mouth daily with breakfast. , Disp: , Rfl:  fluticasone (FLONASE) 50 MCG/ACT nasal spray, USE TWO SPRAY EACH NOSTRIL TWICE DAILY, Disp: 16 g, Rfl: 5 furosemide (LASIX) 40 MG tablet, TAKE ONE TABLET BY MOUTH EVERY DAY, Disp: 30 tablet, Rfl: 5 gabapentin (NEURONTIN) 100 MG capsule, Take 3 capsules by mouth at bedtime, Disp: 100 capsule, Rfl: 6 ibandronate (BONIVA) 150 MG tablet, TAKE 1 TABLET BY MOUTH ONCE A MONTH, Disp: 1 tablet, Rfl: 11 insulin NPH-insulin regular (HUMULIN 70/30) (70-30) 100 UNIT/ML injection, Inject 30 Units into the skin 2 (two) times daily., Disp: , Rfl:  ipratropium (ATROVENT) 0.02 % nebulizer solution, Take 2.5 mLs (500 mcg total) by nebulization 4 (four) times daily., Disp: 75 mL, Rfl: 11 losartan (  COZAAR) 50 MG tablet, TAKE ONE TABLET BY MOUTH EVERY DAY, Disp: 30 tablet, Rfl: 6 metFORMIN (GLUCOPHAGE) 500 MG tablet, TAKE ONE TABLET BY MOUTH TWICE DAILY, Disp: 60 tablet, Rfl: 5 nitroGLYCERIN (NITROSTAT) 0.4 MG SL tablet, Place 1 tablet (0.4 mg total) under the tongue every 5 (five) minutes as needed. May repeat x3, Disp: 25 tablet, Rfl: 6 omeprazole (PRILOSEC) 20 MG capsule, TAKE ONE CAPSULE BY MOUTH  EVERY DAY 30 MINUTES BEFORE A MEAL, Disp: 30 capsule, Rfl: 10 Probiotic Product (PROBIOTIC DAILY PO), Take 1 tablet by mouth daily. , Disp: , Rfl:  PROLENSA 0.07 % SOLN, Place 1 drop into both eyes 4 (four) times daily., Disp: , Rfl:  simvastatin (ZOCOR) 80 MG tablet, TAKE ONE TABLET BY MOUTH AT BEDTIME, Disp: 30 tablet, Rfl: 6 traMADol (ULTRAM) 50 MG tablet, TAKE ONE TABLET BY MOUTH EVERY 8 HOURS AS NEEDED FOR PAIN, Disp: 90 tablet, Rfl: 0 vitamin B-12 (CYANOCOBALAMIN) 500 MCG tablet, Take 1,000 mcg by mouth daily. , Disp: , Rfl:   No current facility-administered medications on file prior to visit.   Past Medical History:   COPD (chronic obstructive pulmonary disease)                 Hyperlipidemia                                               Essential hypertension, benign                               Anemia                                                       Anxiety                                                      Arthritis                                                    Coronary atherosclerosis of native coronary ar*                Comment:PTCA/BMS LAD 2000, PTCA/brachytherapy LAD 2001,              LVEF 55%   Type 2 diabetes mellitus                                     Osteoporosis                                                 Venous insufficiency  Obesity                                                      Myocardial infarction                                          Comment:AMI 2000   Asthma                                                       Sleep apnea                                                 Past Surgical History:   TOTAL ABDOMINAL HYSTERECTOMY                                  CHOLECYSTECTOMY                                               ROTATOR CUFF REPAIR                             Right              CATARACT EXTRACTION W/PHACO                     Left 06/30/2012      Comment:Procedure: CATARACT EXTRACTION  PHACO AND               INTRAOCULAR LENS PLACEMENT (IOC);  Surgeon:               Gemma Payor, MD;  Location: [facility];  Service:               Ophthalmology;  Laterality: Left;  CDE:12.32  Review of patient's family history indicates:   Coronary artery disease        Other                    Social History   Marital Status: Widowed             Spouse Name:                      Years of Education:                 Number of children:             Occupational History   None on file  Social History Main Topics   Smoking Status: Former Smoker                   Packs/Day: 1.00  Years: 30  Types: Cigarettes     Quit date: 03/05/1998   Smokeless Status: Not on file                      Alcohol Use: No             Drug Use: No             Sexual Activity: Yes                    Birth Control/Protection: Surgical  Other Topics            Concern   None on file  Social History Narrative   None on file    ROS:on home oxygensee history of present illness otherwise negative   PHYSICAL EXAM: Well-nournished, in no acute distress. Neck: No JVD, HJR, Bruit, or thyroid enlargement  Lungs: Decreased breath sounds throughout,No tachypnea, clear without wheezing, rales, or rhonchi  Cardiovascular: RRR, PMI not displaced,positive S4 ,1/6 systolic murmur at the left sternal border, no bruit, thrill, or heave.  Abdomen: BS normal. Soft without organomegaly, masses, lesions or tenderness.  Extremities: right groin without hematoma or hemorrhage otherwise lower extremities without cyanosis, clubbing or edema. Good distal pulses bilateral, Bruised left great toe is healing and does not look infected. Patient dropped a can on her foot.  SKin: Warm, no lesions or rashes   Musculoskeletal: No deformities  Neuro: no focal signs  Ht 5' 3.5" (1.613 m)  Wt 204 lb (92.534 kg)  BMI 35.57 kg/m2   ZOX:WRUEAV sinus rhythm with poor R-wave progression consistent with a septal infarct T wave  inversion laterally a little bit more prominent than last EKG  Cardiac cath 07/10/12: Left anterior descending (LAD): The LAD is diffusely diseased. The proximal LAD as 50% stenosis at the first septal perforator. There is moderate calcification in this area. There is a stent in the mid LAD after a small diagonal branch. The stent has diffuse 60-70% in-stent restenosis, most notable at the proximal portion of the stent. This is the area of previous brachii therapy. The distal stent only has mild in-stent restenosis of about 30%. The entire mid and distal LAD are small without focal stenosis.  Left circumflex (LCx): The left circumflex is large in caliber. There is at least moderate calcification. The intermediate branch is small in caliber without significant stenosis. The first obtuse marginal branch is large in caliber without significant stenosis. The left circumflex is dominant as it extends down the AV groove to supply left PDA branch without significant stenosis.  Right coronary artery (RCA): The right coronary artery is small and nondominant. The proximal vessel has 75% stenosis. This vessel supplies only an RV marginal branch    Left ventriculography: Left ventricular systolic function is moderately reduced. There is severe hypokinesis of the inferoapical and periapical walls. The basal anterior wall and basal inferior wall contract normally. The mid anterior wall is mildly hypokinetic. The estimated left ventricular ejection fraction is 35-40%. There appears to be mild to moderate mitral regurgitation assessment is complicated by ventricular ectopy.    Final Conclusions:   1. Moderate diffuse proximal LAD stenosis with in-stent restenosis 2. Nonobstructive stenosis of a large, dominant left circumflex 3. Moderately severe stenosis of a small, nondominant RCA 4. Moderate LV systolic dysfunction  Recommendations: Continued medical management.  Tonny Bollman 07/10/2012, 10:44 AM

## 2012-07-25 ENCOUNTER — Other Ambulatory Visit: Payer: Self-pay | Admitting: Pulmonary Disease

## 2012-07-31 ENCOUNTER — Other Ambulatory Visit: Payer: Self-pay | Admitting: Pulmonary Disease

## 2012-09-03 ENCOUNTER — Other Ambulatory Visit: Payer: Self-pay | Admitting: Pulmonary Disease

## 2012-09-29 ENCOUNTER — Other Ambulatory Visit: Payer: Self-pay | Admitting: Pulmonary Disease

## 2012-10-10 ENCOUNTER — Other Ambulatory Visit: Payer: Self-pay | Admitting: Pulmonary Disease

## 2012-10-10 MED ORDER — ONETOUCH LANCETS MISC: Status: DC

## 2012-10-15 ENCOUNTER — Telehealth: Payer: Self-pay | Admitting: Pulmonary Disease

## 2012-10-15 NOTE — Telephone Encounter (Signed)
I spoke with pt. She stated she needs a letter from [facility] stating he prescribes mucinex 2 po BID for pt. She stated the letter will help lower her rent bc they consider this a medical expense since she pays for this medication out of pocket. Per pt she spends about $60 per month on this medication. SN please advise if okay for letter. She wants this mailed to her. Thanks Last OV 07/16/12 Pending 01/20/13

## 2012-10-17 ENCOUNTER — Encounter: Payer: Self-pay | Admitting: *Deleted

## 2012-10-17 NOTE — Telephone Encounter (Signed)
Per SN: okay for letter. She takes mucinex 600 2 po BID. thanks

## 2012-10-17 NOTE — Telephone Encounter (Signed)
Letter has been and signed by Dr. Kriste Basque. Pt is aware that this will be placed in the mail today.

## 2012-10-21 ENCOUNTER — Other Ambulatory Visit: Payer: Self-pay | Admitting: Pulmonary Disease

## 2012-10-21 MED ORDER — ONETOUCH LANCETS MISC: Status: AC

## 2012-10-30 ENCOUNTER — Telehealth: Payer: Self-pay | Admitting: Pulmonary Disease

## 2012-10-30 MED ORDER — AMOXICILLIN-POT CLAVULANATE 875-125 MG PO TABS: 1.0000 | ORAL_TABLET | Freq: Two times a day (BID) | ORAL | Status: DC

## 2012-10-30 NOTE — Telephone Encounter (Signed)
I spoke with pt and she c/o PND, cough w/ light yellow phlem, wheezing and chest tx x few days. Pt denies any fever, chills, sweats. She wants something called in for this. Please advised SN thanks  Allergies  Allergen Reactions  . Codeine     vomiting  . Terbutaline Sulfate     REACTION: INTOL to terbutaline

## 2012-10-30 NOTE — Telephone Encounter (Signed)
Per Sn---  Call in augmentin 875 mg  #14  1po bid Align once daily activia yogurt daily Nasal saline spray.  Called and spoke with pt and she is aware of SN recs and nothing further is needed.

## 2012-11-01 ENCOUNTER — Other Ambulatory Visit: Payer: Self-pay | Admitting: Pulmonary Disease

## 2012-11-06 ENCOUNTER — Telehealth: Payer: Self-pay | Admitting: Pulmonary Disease

## 2012-11-06 NOTE — Telephone Encounter (Signed)
rx for the gabapentin and alprazolam have already been called to the pharmacy and pt is aware.

## 2012-11-13 ENCOUNTER — Other Ambulatory Visit: Payer: Self-pay | Admitting: Pulmonary Disease

## 2012-11-14 ENCOUNTER — Ambulatory Visit (INDEPENDENT_AMBULATORY_CARE_PROVIDER_SITE_OTHER): Payer: Medicare Other | Admitting: Pulmonary Disease

## 2012-11-14 ENCOUNTER — Encounter: Payer: Self-pay | Admitting: Pulmonary Disease

## 2012-11-14 VITALS — BP 134/82 | HR 78 | Temp 97.6°F | Ht 64.5 in | Wt 202.6 lb

## 2012-11-14 DIAGNOSIS — F411 Generalized anxiety disorder: Secondary | ICD-10-CM

## 2012-11-14 DIAGNOSIS — E119 Type 2 diabetes mellitus without complications: Secondary | ICD-10-CM

## 2012-11-14 DIAGNOSIS — E785 Hyperlipidemia, unspecified: Secondary | ICD-10-CM

## 2012-11-14 DIAGNOSIS — I872 Venous insufficiency (chronic) (peripheral): Secondary | ICD-10-CM

## 2012-11-14 DIAGNOSIS — I10 Essential (primary) hypertension: Secondary | ICD-10-CM

## 2012-11-14 DIAGNOSIS — J209 Acute bronchitis, unspecified: Secondary | ICD-10-CM

## 2012-11-14 DIAGNOSIS — M1990 Unspecified osteoarthritis, unspecified site: Secondary | ICD-10-CM

## 2012-11-14 DIAGNOSIS — I2510 Atherosclerotic heart disease of native coronary artery without angina pectoris: Secondary | ICD-10-CM

## 2012-11-14 DIAGNOSIS — E669 Obesity, unspecified: Secondary | ICD-10-CM

## 2012-11-14 DIAGNOSIS — K219 Gastro-esophageal reflux disease without esophagitis: Secondary | ICD-10-CM

## 2012-11-14 DIAGNOSIS — J449 Chronic obstructive pulmonary disease, unspecified: Secondary | ICD-10-CM

## 2012-11-14 DIAGNOSIS — E1140 Type 2 diabetes mellitus with diabetic neuropathy, unspecified: Secondary | ICD-10-CM

## 2012-11-14 DIAGNOSIS — E1149 Type 2 diabetes mellitus with other diabetic neurological complication: Secondary | ICD-10-CM

## 2012-11-14 DIAGNOSIS — E1142 Type 2 diabetes mellitus with diabetic polyneuropathy: Secondary | ICD-10-CM

## 2012-11-14 MED ORDER — INSULIN NPH ISOPHANE & REGULAR (70-30) 100 UNIT/ML ~~LOC~~ SUSP: SUBCUTANEOUS | Status: DC

## 2012-11-14 MED ORDER — LEVOFLOXACIN 500 MG PO TABS: 500.0000 mg | ORAL_TABLET | Freq: Every day | ORAL | Status: DC

## 2012-11-14 MED ORDER — GABAPENTIN 300 MG PO CAPS: 300.0000 mg | ORAL_CAPSULE | Freq: Three times a day (TID) | ORAL | Status: DC

## 2012-11-14 MED ORDER — METHYLPREDNISOLONE ACETATE 80 MG/ML IJ SUSP
80.0000 mg | Freq: Once | INTRAMUSCULAR | Status: AC
  Administered 2012-11-14: 80 mg via INTRAMUSCULAR

## 2012-11-14 MED ORDER — METHYLPREDNISOLONE (PAK) 4 MG PO TABS: ORAL_TABLET | ORAL | Status: DC

## 2012-11-14 NOTE — Progress Notes (Signed)
Subjective:    Patient ID: [name], female    DOB: April 22, 1940, 72 y.o.   MRN: [id number]  HPI 72 y/o WF here for a follow up visit... [name] has 13 grandchildren & 18 great-grands!  She also has mult med problems including severe COPD; HBP; CAD w/prev MI & PTCA followed by DrMcDowell; VI; Hyperlipidemia; DM on insulin; Obesity; GERD/ constipation; DJD/ osteoporosis; Anxiety...  ~  February 06, 2011:  79mo ROV & she is c/o some head & chest congestion, cough w/ thick brown sputum, sl moe SOB, denies f/c/s, denies CP, etc;  We reviewed meds> NEBS regularly, Mucinex, Fluids, & she wants ZPak for infection- ok...  She requests refills, declines Flu shot...  Labs today w/ Chol ok but TG 221 & needs better diet; BS 237, A1c 8.0 is actually sl better on Novolin 70/30 taking 25uBid + Metformin 500Bid (we reviewed recs for slowly increasing dose)...    She saw DrMcDowell for Cards f/u 10/12> stable w/ longstanding CAD, prev PTCA, not having angina, mobility limited, & he rec continued med rx & observation...    She saw DrAplington 8/12 w/ LBP spondylosis & spinal stenosis> given PT, poor operative risk, wt loss will help, trial TENS, continue meds and Neurontin...  ~  June 07, 2011:  79mo ROV & Leather notes "I'm not doin" c/o depressed (Mother died recently) & angry at [facility]- wants another DME company;  States that she's hurting all over all the time...    She has severe COPD on O2, NEBS w/ AlbutQid & BudesBid, Mucinex, etc; breathing is at baseline DOE w/ ADLs & no real change...    BP controlled on 3 med regimen & she denies CP, palpit, dizzy/syncope, edema, etc...    Lipids under control w/ Simva80 & tol well; FLP 12/12 was ok x for elev TG & we reviewed low fat diet restriction... LABS 4/13:  Chems- ok x BS=202 A1c=7.9;  CBC- ok x Hg=11.6  ~  October 12, 2011:  79mo ROV & [name] is c/o head congestion, drainage, cough & thick whitish sput; denies fever, chills, sweats, discolored phlegm or hemoptysis; she has NEBS w/  Duoneb, Pulmicort, Mucinex, Flonase, MMW.; she is very distressed about being "stopped up" & wants ENT assessment so we will refer ==> she saw DrTeoh 9/13> chr nasal congestion, freq sore throats, hoarseness, post nasal drip; she had edematous mucosa, nasal turbinate hypertrophy, but no infection/ poilyps/ mass & latynx was ok; Rx w/ Saline irrigations, continue other meds & add Atrovent nasal 2spBid...    HBP controlled on Aten, Losar, Lasix; BP= 118/70 & she denies CP, palpit, change in SOB or edema...    CAD w/ prev MI & PTCAs [place] on above meds + ASA...    Dyslipidemia on Zocor80 w/ Chol numbers at goals but Tg still elev & she's been instructed on low fat diet as well...    DM treated w/ Metform & 70/30 insulin currently taking 28u Bid...    She remains obese & is disappointed w/ her results on diet; wt= 206# & BMI~35; we reviewed diet, exercise, wt reduction strategies... LABS 8/13:  FLP- chol ok on Simva80 but TG=200;  Chems- ok x BS=157 A1c=7.8 on Metform+Insulin;  CBC- ok x Hg=11.6, Fe=59;  TSH=1.35;  VitD=38  ~  February 26, 2012:  79mo ROV & [name] is again c/o nasal congestion, thick phlegm in throat, cough w/ clear sput, on meds below; she had CT maxillofacial 9/13 w/ no  signif sinus opacity noted, there was a socket left by right mandib wisdom tooth extraction w/ inflamm (tooth extracted 6/13); treated w/ PenVK, Vicodin, rec ret to Dentist...    HxSinusitis> on MMW, Flonase, OTC antihist prn; c.o chr congestion, no recent infection...    COPD> on O2, NEBS w/ Duoneb/ Budes, Mucinex2Bid; stable & denies ch in cough/ sput/ SOB, etc...    HBP> on Aten50, Losar50, Lasix40; labs 8/13 w/ normal lytes & renal; BP= 126/70 & she denies CP, palpit, ch in SOB, edema, etc...    CAD> on ASA81, NTG prn; followed by DrMcDowell- seen 10/13 & stable, no changes made...    VI> stable on low sodium diet, Lasix40, elevation, support hose...    Hyperlipid> on Simva80; last FLP 8/13  showed TChol 125, TG 200, HDL 39, LDL 46    DM> on 70/30 insulin 30Bid, Metform500Bid; doing well she says w/ BS=101 today at home; labs here 8/13 showed BS=157, A1c=7.8    Obesity> wt is 207# which is up 2# from last visit; we reviewed diet, exercise, wt reduction strategies...    GI- GERD, Constip> on Omep20; hx gastritis & prepyloric ulcers on EGD 3/12, rec to stay on the PPI Rx; last colon 2007 by DrJacobs was neg...    DJD> on Lodine400Bid prn, Tramadol50Tid prn, folowed by [facility]...    Osteoporosis> on Boniva150, clacium, MVI, VitD1000; labs 8/13 showed VitD=38; last BMD 5/13 w/ TScore -1.6 in hips...    Neuropathy> on Neurontin100Tid...    Anxiety> on Xanax0.5mg  prn...    Borderline Anemia> on Fe, B12 1085mcg/d; labs 8/13 showed Hg=11.6, MCV=93, Fe=59 (18%sat),  We reviewed prob list, meds, xrays and labs> see below for updates >>   ~  Jul 16, 2012:  4-18mo ROV & post hosp check> Schelly was eval recently by Cards for CP & an abn Myoview- Cath 5/14 revealed mod diffuse LAD disease w/ 50%proxLAD stenosis w/ a 60-70% in-stent restenosis in the midLAD; RCA was small w/ a 75% prox stenosis; LCx was dominant w/o focal stenosis but w/ mod diffuse calcium; HK of inferoapical wall w/ EF= 35-40% and mild-mod MR (Rec was for continued medical management)... We reviewed the following medical problems during today's office visit >>     HxSinusitis> on MMW, Flonase, OTC antihist prn; c/o chr congestion, no recent infection...    COPD> on O2, NEBS w/ Duoneb/ Budes, Mucinex2Bid; stable w/o exac & denies ch in cough/ sput/ SOB, etc...    HBP> on Aten50, Losar50, Lasix40; labs 5/14 w/ normal lytes & renal; BP= 126/82 & she denies CP, palpit, ch in SOB, edema, etc...    CAD> on ASA81, NTG prn; followed by DrMcDowell- seen 5/14 w/ abn Myoview & subseq Cath- AS ABOVE, med rx...    VI> stable on low sodium diet, Lasix40, elevation, support hose...    Hyperlipid> on Simva80; FLP 5/14 shows TChol 134, TG 227, HDL  33, LDL 74... Advised better low fat diet, exercise, & get wt down...    DM> on 70/30 insulin 30Bid, Metform500Bid; doing well she says; labs today showed BS=160, A1c=7.3 (sl improved- continue same + better diet)...    Obesity> wt is 201# which is down 6# from last visit; we reviewed diet, exercise, wt reduction strategies...    GI- GERD, Constip> on Omep20 & Probiotic; hx gastritis & prepyloric ulcers on EGD 3/12, rec to stay on the PPI Rx; last colon 2007 by DrJacobs was neg...    DJD> on Lodine400Bid prn, Tramadol50Tid prn, folowed  by Alinda Sierras...    Osteoporosis> on Boniva150, calcium, MVI, VitD1000; labs 8/13 showed VitD=38; last BMD 5/13 w/ TScore -1.6 in hips...    Neuropathy> on Neurontin300Qhs...    Anxiety> on Xanax0.5mg  prn...    Hx Borderline Anemia> on Fe, B12 1011mcg/d; labs 5/14 showed Hg=13.2, MCV=90, last Fe was 8/13 = 59 (18%sat),  We reviewed prob list, meds, xrays and labs> see below for updates >>  LABS 5/14:  FLP- not at goals w/ TG=227, HDL=33;  Chems- ok x BS=160, A1c=7.3;  CBC- wnl;  TSH=2.09...  ~  November 14, 2012:  48mo ROV & Makenley is added-on for URI w/ cough, thick yellow sput, feeling poorly, SOB, wheezing, etc;  We called in Augmentin & rec nasal saline- min improvement but persist symptoms;  Exam shows decr BS 7 end exp wheezing/ rhonchi;  She has underlying COPD, ex-smoker, hx sinusitis, on NEBS, Mucinex;  Rec- Levaquin x10d, Depo80, Dosepak, incr Mucinex to 2Bid + fluids...     BP remains controlled on Aten, Losar, Lasix & measures 134/82;  She denies CP, palpit, edema;  Chol has been stable on Simva80 & she doesn't want to change;  DM controlled w/ 70/30 insulin on 30uBid, hasn't been able to lose weight stating "I can't starve myself"    We reviewed prob list, meds, xrays and labs> see below for updates >>           Problem List:  MAXILLARY SINUSITIS (ICD-461.0)  - hx sinusitis symptoms 3/09 w/ CTSinus neg for acute changes... Rx'd w/ MUCINEX, Nasal Saline,  FLONASE...   ~  2/11:  c/o sinusitis & AUGMENTIN perscribed. ~  9/13:  ER visit w/ jaw pain- had wisdom tooth extracted 6/13 by DrLewis in [place]; CT showed socket left by right mandib molar extraction w/ inflamm; given PenVK, Vicodin, told f/u w/ dentist...  COPD (ICD-496) - she has COPD and is an ex-smoker... controlled on NEBULIZER w/ Duoneb Qid + Budesonide Bid, MUCINEX, etc... she has end stage disease w/ severe dyspnea w/ ADLs and no change over the last yr... ~  baseline CXR = COPD, incr heart size, NAD. ~ CT Chest 2/02 showed COPD, Atx, sl scarring, NAD. ~  bronchoscopy 3/02 w/ mucous plugging, no endobr lesions. ~  PFT's in 12/07 showed FVC=0.81 (27%), FEV1=0.54 (22%), %1sec=67, mid-flows=13%pred. ~  CXR 1/10 showed chr changes, cardiomeg, lingular atx. ~  CXR 2/11 showed cardiomeg, bibasilar scarring, NAD. ~  CXR 2/12 showed cardiomeg, COPD,scarring left base, NAD. ~  CXR 5/14 showed cardiomeg, mild basilar atx/ flattened diaph/ NAD, DJD in Tspine, s/p chole... ~  9/14:  Presents w/ URI & AB exac- unresponsive to Augmentin called in, & rec Levaquin, Depo, Dosepak, Mucinex, Fluids, etc...  HYPERTENSION (ICD-401.9) - controlled on ATENOLOL 50mg /d, LOSARTAN 50mg /d, LASIX 40mg /d... BP=126/70 and tol well... denies HA, visual changes, CP, palipit, dizziness, syncope, change in dyspnea, edema, etc. ~  Jan10: tried to change ACE to Diovan but too $$$ & requested change back to Lisinopril==> eventually changed to generic LOSARTAN... ~  5/14:  on Aten50, Losar50, Lasix40; labs 5/14 w/ normal lytes & renal; BP= 126/82 & she denies CP, palpit, ch in SOB, edema, etc.  CORONARY ARTERY DISEASE (ICD-414.00) - on ASA 81mg /d... hx of CAD w/ prev MI in 2000, and subseq PTCA's in 2000, & 2001... ~  last cath 2001 w/ PTCA to mid-LAD lesion w/ cutting balloon & brachytherapy for in-stent restenosis. ~  last hosp 6/08 w/ CP- felt to be non-cardiac... ~  last 2DECho 6/08 showed HK of inferoseptal area,  EF=55%... ~  last NuclearStressTest 7/08 w/ mod ant/apical/septal infarct, apical HK, no ischemia, EF=?53%... ~  follow[facility] by [facility]Dowell at [facility]- notes reviewed... ~  4/13: Note from DrMcDowell> compliant w/ meds but not exercising, unsuccessful w/ wt loss, CAD/ BP/ Lipids- stable, no changes made... ~  1013: she saw DrMcDowell- mild chest congestion, tooth infection, on O2, not exercising enough, no CP etc; no changes made... ~  EKG 10/13 showed NSR, rate74, IVCD, poor R prog V1-3, NSSTTWA... ~  EKG 4/14 showed NSR, rate66, LAD, LVH, poor R progression, NSSTTWA... ~  Myoview 4/14 showed scar inferoapically, mild peri-infarct ischemia, EF reduced to 40% w/ wall motion abn noted... ~  Cath 5/14> mod diffuse LAD disease w/ 50%proxLAD stenosis w/ a 60-70% in-stent restenosis in the midLAD; RCA was small w/ a 75% prox stenosis; LCx was dominant w/o focal stenosis but w/ mod diffuse calcium; HK of inferoapical wall w/ EF= 35-40% and mild-mod MR (Rec was for continued medical management).  VENOUS INSUFFICIENCY (ICD-459.81) - low sodium diet, elevation, & Lasix 40-80mg /d... states she "allergic" to TED hose! and refuses to wear them...   HYPERLIPIDEMIA (ICD-272.4) - on ZOCOR 80mg /d + FISH OIL 1000mg /d ... she has been counselled on diet & exercise but unsuccessful... ~  FLP 11/08 on Simva40 showed TChol 150, TG 185, HDL 35, LDL 78 ~  FLP 3/09 on Simva40 showed TChol 139, TG 193, HDL 30, LDL 70... DrMcDowell incr to Simva80 9/09. ~  FLP 2/10 on Simva80 showed TChol 155, TG 137, HDL 43, LDL 85 ~  FLP 10/10 showed TChol 129, TG 96, HDL 39, LDL 71 ~  FLP 8/11 showed TChol 130, TG 152, HDL 30, LDL 70 ~  FLP 2/12 showed TChol 139, TG 96, HDL 40, LDL 80 ~  FLP 12/12 on Simva80 showed TChol 154, TG 221, HDL 39, LDL 81... LFTs remain wnl; rec> better low fat diet... ~  FLP 8/13 on Simva80 showed TChol 125, TG 200, HDL 39, LDL 46 ~  FLP 5/14 on Simva80 showed TChol 134, TG 227, HDL 33, LDL 74.  DM  (ICD-250.00) - on HUMULIN 70/30- now taking 25uAM & 25uPM she says & GLUCOPHAGE 500mg Bid (off prev Avandia)... ~  BS=142 & HgA1c=6.6 in Nov08... ~  Labs 3/09 showed BS= 112, HgA1c= 6.8 ~  labs 7/09 showed BS= 197, HgA1c= 7.1.Marland KitchenMarland Kitchen rec- better diet! ~  labs 2/10 showed BS= 134, A1c= 7.0 ~  labs 10/10 showed BS= 151, A1c= 6.9 ~  labs 2/11 showed BS= 239, A1c= 8.1.Marland KitchenMarland Kitchen pt instructed to up titrate insulin til BS= 120-150 range. ~  labs 8/11 showed BS= 215, A1c= 8.4... again instructed to incr insulin slowly & titrate to BS values. ~  labs 2/12 showed BS= 236, A1c= 8.8.Marland KitchenMarland Kitchen rec to incr 70/30 insulin incrementally... ~  Labs 8/12 showed BS= 199, A1c= 9.2.Marland KitchenMarland Kitchen rec slowly incr her 70/30 insulin doses as discussed. ~  Labs 12/12 on 70/30Insulin25uBid+Metform500Bid showed BS= 237, A1c= 8.0..Marland Kitchen We discussed incr to 30uAM & 30uPM... ~  Labs 8/13 on 70/30Insulin28uBid+Metform500Bid showed BS= 157, A1c= 7.8.Marland KitchenMarland Kitchen rec incr 70/30 insulin to 30uBid... ~  5/14: on 70/30 insulin 30Bid, Metform500Bid; doing well she says; labs today showed BS=160, A1c=7.3 (sl improved- continue same + better diet).  OBESITY (ICD-278.00) - we discussed diet + exercise program required to lose weight! ~  weight 2008 = 235# ~  weight 2/10 = 250# ~  weight 10/10 = 258# ~  weight 2/11 =  241# ~  weight 8/11 = 217# ~  weight 2/12 = 215# ~  Weight 8/12 = 214# ~  Weight 12/12 = 209# ~  Weight 8/13 = 206# ~  Weight 12/13 = 207# ~  Weight 5/14 = 201#  GERD (ICD-530.81) - on PPI therapy (changed 2/10 per request to OMEPRAZOLE20mg /d)... ~  [facility] 3/12 for COPD exac & was anemic w/ heme pos stool & GIB; EGD by DrJacobs w/ gastritis & prepyloric ulcers, +HPylori- treated & improved.  CONSTIPATION (ICD-564.00) - last colonoscopy was 11/07 by DrJacobs and negative...  DEGENERATIVE JOINT DISEASE (ICD-715.90) - prev CSpine films w/ spondy and biforaminal narrowing... LSSpine films w/ DDD and osteopenia...  ~  she saw DrDrake, Ortho in [place], for shot in  right foot. ~  9/11:  she saw DrNorris for shot in right knee. ~  8/12: presents c/o LBP, leg pain; XRay Lumbar spine w/ DDD, narrowing, osteophytes, facet dis, etc; Rx w/ rest, heat, TRAMADOL, ROBAXIN, refer to Ortho. ~  She has followed up w/ DrApplington> not a surg candidate for her lumbar spondylosis & sp stenosis...  OSTEOPOROSIS (ICD-733.00) - on BONIVA 150mg /mo, Calcium, Vits... she needs a BMD... ~  labs 2/11 showed Vit D level = 27... OK to change to 1000 u daily. ~  labs 2/12 showed Vit D level = 37... rec to continue 1000u supplement ~  BMD 5/13 showed TScore -1.6 in hips & rec to continue Boniva, Calcium, MVI, VitD... ~  Labs 8/13 showed Vit D level = 38... Continue supplement.  ANXIETY (ICD-300.00) - uses Alprazolam 0.5mg  as needed...  ANEMIA, MILD (ICD-285.9) ~  labs 2/10 showed Hg= 11.9, Fe= 51... ~  labs 10/10 showed Hg= 11.9, Fe= 50... rec OTC Fe supplement. ~  labs 2/11 showed Hg= 13.4, Fe= 52 (14%sat) ~  labs 2/12 showed Hg= 13.1, MCV= 93 ~  Labs 8/12 showed Hg= 12.0, Fe= 61 (15%sat); rec to continue oral iron... ~  Labs 8/13 showed Hg= 11.6, Fe= 59; rec to continue oral iron supplement. ~  Labs 5/14 showed Hg= 13.2  Health Maintenance - she hasn't seen GYN in yrs and is asked to re-establish... prev refused Mammograms due to the discomfort & bruising- she will discuss w/ Gyn & get a follow up BMD as well...   Past Surgical History  Procedure Laterality Date  . Total abdominal hysterectomy    . Cholecystectomy    . Rotator cuff repair Right   . Cataract extraction w/phaco Left 06/30/2012    Procedure: CATARACT EXTRACTION PHACO AND INTRAOCULAR LENS PLACEMENT (IOC);  Surgeon: Gemma Payor, MD;  Location: [facility];  Service: Ophthalmology;  Laterality: Left;  CDE:12.32    Outpatient Encounter Prescriptions as of 11/14/2012  Medication Sig Dispense Refill  . acetaminophen (TYLENOL) 500 MG tablet Take 500 mg by mouth every 6 (six) hours as needed. pain      . albuterol  (PROVENTIL) (2.5 MG/3ML) 0.083% nebulizer solution Take 3 mLs (2.5 mg total) by nebulization 4 (four) times daily.  75 mL  11  . ALPRAZolam (XANAX) 0.5 MG tablet TAKE ONE -HALF TO ONE TABLET BY MOUTH THREE TIMES DAILY AS NEEDED  90 tablet  5  . Alum & Mag Hydroxide-Simeth (MAGIC MOUTHWASH) SOLN 1 tsp gargle and swallow four times daily as needed for sore throat  120 mL  5  . aspirin 81 MG tablet Take 81 mg by mouth daily.      Marland Kitchen atenolol (TENORMIN) 50 MG tablet Take 1 tablet (50 mg total)  by mouth daily.  30 tablet  11  . BESIVANCE 0.6 % SUSP Place 1 drop into both eyes 4 (four) times daily.      . budesonide (PULMICORT) 0.25 MG/2ML nebulizer solution Take 2 mLs (0.25 mg total) by nebulization 2 (two) times daily.  60 mL  11  . calcium-vitamin D (OSCAL WITH D) 500-200 MG-UNIT per tablet Take 1 tablet by mouth 2 (two) times daily.        . Cholecalciferol (VITAMIN D3) 1000 UNITS CAPS Take by mouth daily.        Marland Kitchen dextromethorphan-guaiFENesin (MUCINEX DM) 30-600 MG per 12 hr tablet Take 2 tablets by mouth every 12 (twelve) hours.        . DUREZOL 0.05 % EMUL Place 1 drop into both eyes 4 (four) times daily.      Marland Kitchen etodolac (LODINE) 400 MG tablet TAKE ONE TABLET BY MOUTH EVERY 12 HOURS  60 tablet  3  . ferrous sulfate 325 (65 FE) MG tablet Take 325 mg by mouth daily with breakfast.       . fluticasone (FLONASE) 50 MCG/ACT nasal spray USE TWO SPRAY EACH NOSTRIL TWICE DAILY  16 g  5  . furosemide (LASIX) 40 MG tablet TAKE ONE TABLET BY MOUTH EVERY DAY  30 tablet  6  . gabapentin (NEURONTIN) 100 MG capsule TAKE THREE CAPSULES BY MOUTH AT BEDTIME  100 capsule  5  . HUMULIN 70/30 (70-30) 100 UNIT/ML injection INJECT 25 UNITS IN THE MORNING AND 25 UNITS IN THE EVENING.  120 mL  6  . ibandronate (BONIVA) 150 MG tablet TAKE 1 TABLET BY MOUTH ONCE A MONTH  1 tablet  11  . ipratropium (ATROVENT) 0.02 % nebulizer solution Take 2.5 mLs (500 mcg total) by nebulization 4 (four) times daily.  75 mL  11  .  isosorbide mononitrate (IMDUR) 30 MG 24 hr tablet Take 1 tablet (30 mg total) by mouth daily.  90 tablet  1  . losartan (COZAAR) 50 MG tablet TAKE ONE TABLET BY MOUTH EVERY DAY  30 tablet  6  . metFORMIN (GLUCOPHAGE) 500 MG tablet TAKE ONE TABLET BY MOUTH TWICE DAILY  60 tablet  6  . nitroGLYCERIN (NITROSTAT) 0.4 MG SL tablet Place 1 tablet (0.4 mg total) under the tongue every 5 (five) minutes as needed. May repeat x3  25 tablet  6  . omeprazole (PRILOSEC) 20 MG capsule TAKE ONE CAPSULE BY MOUTH EVERY DAY 30 MINUTES BEFORE A MEAL  30 capsule  6  . ONE TOUCH LANCETS MISC Check blood sugars two times daily  100 each  6  . Probiotic Product (PROBIOTIC DAILY PO) Take 1 tablet by mouth daily.       Marland Kitchen PROLENSA 0.07 % SOLN Place 1 drop into both eyes 4 (four) times daily.      . simvastatin (ZOCOR) 80 MG tablet TAKE ONE TABLET BY MOUTH AT BEDTIME  30 tablet  6  . traMADol (ULTRAM) 50 MG tablet TAKE ONE TABLET BY MOUTH EVERY 8 HOURS AS NEEDED FOR PAIN  90 tablet  5  . vitamin B-12 (CYANOCOBALAMIN) 500 MCG tablet Take 1,000 mcg by mouth daily.       . [DISCONTINUED] amoxicillin-clavulanate (AUGMENTIN) 875-125 MG per tablet Take 1 tablet by mouth 2 (two) times daily.  14 tablet  0   No facility-administered encounter medications on file as of 11/14/2012.     Allergies  Allergen Reactions  . Codeine     vomiting  .  Terbutaline Sulfate     REACTION: INTOL to terbutaline    Current Medications, Allergies, Past Medical History, Past Surgical History, Family History, and Social History were reviewed in [employer] record.    Review of Systems         See HPI - all other systems neg except as noted...   The patient complains of dyspnea on exertion, peripheral edema, muscle weakness, and difficulty walking.  The patient denies anorexia, fever, weight loss, weight gain, vision loss, decreased hearing, hoarseness, chest pain, syncope, prolonged cough, headaches, hemoptysis,  abdominal pain, melena, hematochezia, severe indigestion/heartburn, hematuria, incontinence, suspicious skin lesions, transient blindness, depression, unusual weight change, abnormal bleeding, enlarged lymph nodes, and angioedema.    Objective:   Physical Exam    WD, Obese, 72 y/o WF in NAD... GENERAL:  Alert & oriented; pleasant & cooperative...  HEENT:  [place]/AT, EOM-wnl, PERRLA, EACs-clear, TMs-wnl, NOSE-clear, THROAT-clear & wnl. NECK:  Supple w/ fairROM; no JVD; normal carotid impulses w/o bruits; no thyromegaly or nodules palpated; no lymphadenopathy. CHEST:  Decr BS bilat, few scat rhonchi, without wheezing, rales, or rubs...upper airway psuedo-wheezing HEART:  Regular Rhythm;  gr 1/6 SEM without rubs or gallops detected... ABDOMEN:  Obese w/ panniculus, soft & nontender; normal bowel sounds; no organomegaly or masses detected. EXT: without deformities, mild arthritic changes; no varicose veins/ +venous insuffic/ tr edema. Sl tender on palpation over lumbar area & siatic notch... NEURO:  CN's intact; no focal neuro deficits... DERM:  no lesion seen...  RADIOLOGY DATA:  Reviewed in the EPIC EMR & discussed w/ the patient...  LABORATORY DATA:  Reviewed in the EPIC EMR & discussed w/ the patient...   Assessment & Plan:    COPD>  Severe, w/ infectious exac- continue home Oxygen, Nebulizer, Mucinex, & Fluids;  Treat w/ Levaquin, Depo80, Dosepak, etc...  HBP>  Good control on Atenolol50, Losartan50, Furosemide40; discussed diet, must get wt down, no sodium...  CAD>  Followed by DrMcDowell in [place] office- recent cath reviewed, no option for surg or intervention, continue medical management...  HYPERLIPID>  On Simva80 + Fish Oil; needs better diet & get wt down!  DM>  Control remains fair as she has been unable to diet effectively, lose weight, etc; now on 70/30 insulin taking 30u Bid & improved...  OBESITY>  Weight reduction is key to improvement in all areas...  GI> GERD,  Ulcers, +HPylori, Constip>  Improved after HPylori Rx & back on Omep 20mg /d...  DJD, LBP, etc>  Lumbar XRay reviewed, trial Tramadol, Robaxin, rest/ heat/ etc... Refer to Ortho for further eval & Rx...  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  Anxiety>  Alprazolam for prn use- refilled...  Anemia>  On Fe daily and Hg is improved (s/p hosp w/ GIB)...   Patient's Medications  New Prescriptions   GABAPENTIN (NEURONTIN) 300 MG CAPSULE    Take 1 capsule (300 mg total) by mouth 3 (three) times daily.   LEVOFLOXACIN (LEVAQUIN) 500 MG TABLET    Take 1 tablet (500 mg total) by mouth daily.   METHYLPREDNISOLONE (MEDROL DOSPACK) 4 MG TABLET    follow package directions  Previous Medications   ACETAMINOPHEN (TYLENOL) 500 MG TABLET    Take 500 mg by mouth every 6 (six) hours as needed. pain   ALBUTEROL (PROVENTIL) (2.5 MG/3ML) 0.083% NEBULIZER SOLUTION    Take 3 mLs (2.5 mg total) by nebulization 4 (four) times daily.   ALPRAZOLAM (XANAX) 0.5 MG  TABLET    TAKE ONE -HALF TO ONE TABLET BY MOUTH THREE TIMES DAILY AS NEEDED   ALUM & MAG HYDROXIDE-SIMETH (MAGIC MOUTHWASH) SOLN    1 tsp gargle and swallow four times daily as needed for sore throat   ASPIRIN 81 MG TABLET    Take 81 mg by mouth daily.   ATENOLOL (TENORMIN) 50 MG TABLET    Take 1 tablet (50 mg total) by mouth daily.   BESIVANCE 0.6 % SUSP    Place 1 drop into both eyes 4 (four) times daily.   BUDESONIDE (PULMICORT) 0.25 MG/2ML NEBULIZER SOLUTION    Take 2 mLs (0.25 mg total) by nebulization 2 (two) times daily.   CALCIUM-VITAMIN D (OSCAL WITH D) 500-200 MG-UNIT PER TABLET    Take 1 tablet by mouth 2 (two) times daily.     CHOLECALCIFEROL (VITAMIN D3) 1000 UNITS CAPS    Take by mouth daily.     DEXTROMETHORPHAN-GUAIFENESIN (MUCINEX DM) 30-600 MG PER 12 HR TABLET    Take 2 tablets by mouth every 12 (twelve) hours.     DUREZOL 0.05 % EMUL    Place 1 drop into both eyes 4 (four) times daily.   ETODOLAC  (LODINE) 400 MG TABLET    TAKE ONE TABLET BY MOUTH EVERY 12 HOURS   FERROUS SULFATE 325 (65 FE) MG TABLET    Take 325 mg by mouth daily with breakfast.    FLUTICASONE (FLONASE) 50 MCG/ACT NASAL SPRAY    USE TWO SPRAY EACH NOSTRIL TWICE DAILY   FUROSEMIDE (LASIX) 40 MG TABLET    TAKE ONE TABLET BY MOUTH EVERY DAY   IBANDRONATE (BONIVA) 150 MG TABLET    TAKE 1 TABLET BY MOUTH ONCE A MONTH   IPRATROPIUM (ATROVENT) 0.02 % NEBULIZER SOLUTION    Take 2.5 mLs (500 mcg total) by nebulization 4 (four) times daily.   ISOSORBIDE MONONITRATE (IMDUR) 30 MG 24 HR TABLET    Take 1 tablet (30 mg total) by mouth daily.   LOSARTAN (COZAAR) 50 MG TABLET    TAKE ONE TABLET BY MOUTH EVERY DAY   METFORMIN (GLUCOPHAGE) 500 MG TABLET    TAKE ONE TABLET BY MOUTH TWICE DAILY   NITROGLYCERIN (NITROSTAT) 0.4 MG SL TABLET    Place 1 tablet (0.4 mg total) under the tongue every 5 (five) minutes as needed. May repeat x3   OMEPRAZOLE (PRILOSEC) 20 MG CAPSULE    TAKE ONE CAPSULE BY MOUTH EVERY DAY 30 MINUTES BEFORE A MEAL   ONE TOUCH LANCETS MISC    Check blood sugars two times daily   PROBIOTIC PRODUCT (PROBIOTIC DAILY PO)    Take 1 tablet by mouth daily.    PROLENSA 0.07 % SOLN    Place 1 drop into both eyes 4 (four) times daily.   SIMVASTATIN (ZOCOR) 80 MG TABLET    TAKE ONE TABLET BY MOUTH AT BEDTIME   TRAMADOL (ULTRAM) 50 MG TABLET    TAKE ONE TABLET BY MOUTH EVERY 8 HOURS AS NEEDED FOR PAIN   VITAMIN B-12 (CYANOCOBALAMIN) 500 MCG TABLET    Take 1,000 mcg by mouth daily.   Modified Medications   Modified Medication Previous Medication   INSULIN NPH-REGULAR (HUMULIN 70/30) (70-30) 100 UNIT/ML INJECTION HUMULIN 70/30 (70-30) 100 UNIT/ML injection      INJECT 30 UNITS IN THE MORNING AND 30  UNITS IN THE EVENING.    INJECT 25 UNITS IN THE MORNING AND 25 UNITS IN THE EVENING.  Discontinued Medications   AMOXICILLIN-CLAVULANATE (AUGMENTIN)  875-125 MG PER TABLET    Take 1 tablet by mouth 2 (two) times daily.   GABAPENTIN  (NEURONTIN) 100 MG CAPSULE    TAKE THREE CAPSULES BY MOUTH AT BEDTIME

## 2012-11-14 NOTE — Patient Instructions (Addendum)
Today we updated your med list in our EPIC system...    Continue your current medications the same...  We gave you a Depo shot and Medrol dosepak for the airway inflammation- take as directed...    Continue all your breathing meds the same...    Take the Levaquin antibiotic once daily for 10d til gone...    And be sure to continue the Probiotic like ALIGN daily as well...  Continue the Mucinex & drink lots of water...  Call for any questions...  Keep your follow up appt for 11/18 so we can assess your response to treatment.Marland KitchenMarland Kitchen

## 2012-11-26 ENCOUNTER — Telehealth: Payer: Self-pay | Admitting: Pulmonary Disease

## 2012-11-26 DIAGNOSIS — M25562 Pain in left knee: Secondary | ICD-10-CM

## 2012-11-26 NOTE — Telephone Encounter (Signed)
Per SN---  Ok for the knee brace.   Refer to ortho---[facility]---Dr. Alusio/Olin.   thanks

## 2012-11-26 NOTE — Telephone Encounter (Signed)
I spoke with pt. She is requesting to get a knee brace from [facility]. She stated arthritis is causing her knee to hurt. She does not have an orthopedic doctor. Please advise SN if okay to do so thanks. Pt stated ignore the back brace request.

## 2012-11-26 NOTE — Telephone Encounter (Signed)
Pt aware and order will be faxed to [facility]. Referral placed as well. Nothing further needed

## 2012-11-27 ENCOUNTER — Other Ambulatory Visit: Payer: Self-pay | Admitting: *Deleted

## 2012-11-27 MED ORDER — GABAPENTIN 300 MG PO CAPS: 300.0000 mg | ORAL_CAPSULE | Freq: Three times a day (TID) | ORAL | Status: DC

## 2012-12-11 ENCOUNTER — Telehealth: Payer: Self-pay | Admitting: Pulmonary Disease

## 2012-12-11 NOTE — Telephone Encounter (Signed)
Leigh do you have any forms on this pt? [name], CMA

## 2012-12-15 NOTE — Telephone Encounter (Signed)
Will forward to Leigh to advise if she and SN has received any papers on this patient; SN was not in office on Friday. Thanks.

## 2012-12-15 NOTE — Telephone Encounter (Signed)
ATC x 4-line is busy; will need to try again later.

## 2012-12-15 NOTE — Telephone Encounter (Signed)
Papers were in SN sign folder and this has been sent up front for papers to be faxed.

## 2012-12-16 NOTE — Telephone Encounter (Signed)
ATC x 3 - line busy.  WCB 

## 2012-12-17 NOTE — Telephone Encounter (Signed)
Line ringing fast busy signal. Will sign off message as triage as tried several times.

## 2012-12-25 ENCOUNTER — Encounter: Payer: Medicare Other | Admitting: Adult Health

## 2012-12-25 NOTE — Progress Notes (Signed)
HPI: Mr. Kovacich is a 72 year old patient of Dr. Diona Browner we are following for ongoing assessment and management of CAD, status post PTCA with bare-metal stent to the LAD in 2000, PTCA and breaking therapy to the LAD in 2001. He had preserved systolic function. The patient was last seen in the office by Wynell Balloon PA on 07/23/2012 with complaints of chest pain. The discomfort usually occurred when she was up walking around and was relieved with sublingual nitroglycerin.   Cardiac catheterization was completed on 07/10/2012, revealing moderate diffuse proximal LAD stenosis with in-stent restenosis, nonobstructive stenosis of a large dominant left circumflex, moderately severe stenosis of a small nondominant RCA, with moderate LV systolic dysfunction. EF was 35-40%. It is recommended that she continue medical management. Indoor 30 mg was added to her medication regimen at that time and she is here for followup concerning her response.  Allergies  Allergen Reactions  . Codeine     vomiting  . Terbutaline Sulfate     REACTION: INTOL to terbutaline    Current Outpatient Prescriptions  Medication Sig Dispense Refill  . acetaminophen (TYLENOL) 500 MG tablet Take 500 mg by mouth every 6 (six) hours as needed. pain      . albuterol (PROVENTIL) (2.5 MG/3ML) 0.083% nebulizer solution Take 3 mLs (2.5 mg total) by nebulization 4 (four) times daily.  75 mL  11  . ALPRAZolam (XANAX) 0.5 MG tablet TAKE ONE -HALF TO ONE TABLET BY MOUTH THREE TIMES DAILY AS NEEDED  90 tablet  5  . Alum & Mag Hydroxide-Simeth (MAGIC MOUTHWASH) SOLN 1 tsp gargle and swallow four times daily as needed for sore throat  120 mL  5  . aspirin 81 MG tablet Take 81 mg by mouth daily.      Marland Kitchen atenolol (TENORMIN) 50 MG tablet Take 1 tablet (50 mg total) by mouth daily.  30 tablet  11  . BESIVANCE 0.6 % SUSP Place 1 drop into both eyes 4 (four) times daily.      . budesonide (PULMICORT) 0.25 MG/2ML nebulizer solution Take 2 mLs (0.25 mg  total) by nebulization 2 (two) times daily.  60 mL  11  . calcium-vitamin D (OSCAL WITH D) 500-200 MG-UNIT per tablet Take 1 tablet by mouth 2 (two) times daily.        . Cholecalciferol (VITAMIN D3) 1000 UNITS CAPS Take by mouth daily.        Marland Kitchen dextromethorphan-guaiFENesin (MUCINEX DM) 30-600 MG per 12 hr tablet Take 2 tablets by mouth every 12 (twelve) hours.        . DUREZOL 0.05 % EMUL Place 1 drop into both eyes 4 (four) times daily.      Marland Kitchen etodolac (LODINE) 400 MG tablet TAKE ONE TABLET BY MOUTH EVERY 12 HOURS  60 tablet  3  . ferrous sulfate 325 (65 FE) MG tablet Take 325 mg by mouth daily with breakfast.       . fluticasone (FLONASE) 50 MCG/ACT nasal spray USE TWO SPRAY EACH NOSTRIL TWICE DAILY  16 g  5  . furosemide (LASIX) 40 MG tablet TAKE ONE TABLET BY MOUTH EVERY DAY  30 tablet  6  . gabapentin (NEURONTIN) 300 MG capsule Take 1 capsule (300 mg total) by mouth 3 (three) times daily.  90 capsule  0  . ibandronate (BONIVA) 150 MG tablet TAKE 1 TABLET BY MOUTH ONCE A MONTH  1 tablet  11  . insulin NPH-regular (HUMULIN 70/30) (70-30) 100 UNIT/ML injection INJECT 30 UNITS  IN THE MORNING AND 30  UNITS IN THE EVENING.  20 mL  6  . ipratropium (ATROVENT) 0.02 % nebulizer solution Take 2.5 mLs (500 mcg total) by nebulization 4 (four) times daily.  75 mL  11  . isosorbide mononitrate (IMDUR) 30 MG 24 hr tablet Take 1 tablet (30 mg total) by mouth daily.  90 tablet  1  . levofloxacin (LEVAQUIN) 500 MG tablet Take 1 tablet (500 mg total) by mouth daily.  10 tablet  0  . losartan (COZAAR) 50 MG tablet TAKE ONE TABLET BY MOUTH EVERY DAY  30 tablet  6  . metFORMIN (GLUCOPHAGE) 500 MG tablet TAKE ONE TABLET BY MOUTH TWICE DAILY  60 tablet  6  . methylPREDNIsolone (MEDROL DOSPACK) 4 MG tablet follow package directions  21 tablet  0  . nitroGLYCERIN (NITROSTAT) 0.4 MG SL tablet Place 1 tablet (0.4 mg total) under the tongue every 5 (five) minutes as needed. May repeat x3  25 tablet  6  . omeprazole  (PRILOSEC) 20 MG capsule TAKE ONE CAPSULE BY MOUTH EVERY DAY 30 MINUTES BEFORE A MEAL  30 capsule  6  . ONE TOUCH LANCETS MISC Check blood sugars two times daily  100 each  6  . Probiotic Product (PROBIOTIC DAILY PO) Take 1 tablet by mouth daily.       Marland Kitchen PROLENSA 0.07 % SOLN Place 1 drop into both eyes 4 (four) times daily.      . simvastatin (ZOCOR) 80 MG tablet TAKE ONE TABLET BY MOUTH AT BEDTIME  30 tablet  6  . traMADol (ULTRAM) 50 MG tablet TAKE ONE TABLET BY MOUTH EVERY 8 HOURS AS NEEDED FOR PAIN  90 tablet  5  . vitamin B-12 (CYANOCOBALAMIN) 500 MCG tablet Take 1,000 mcg by mouth daily.        No current facility-administered medications for this visit.    Past Medical History  Diagnosis Date  . COPD (chronic obstructive pulmonary disease)   . Hyperlipidemia   . Essential hypertension, benign   . Anemia   . Anxiety   . Arthritis   . Coronary atherosclerosis of native coronary artery     PTCA/BMS LAD 2000, PTCA/brachytherapy LAD 2001, LVEF 55%  . Type 2 diabetes mellitus   . Osteoporosis   . Venous insufficiency   . Obesity   . Myocardial infarction     AMI 2000  . Asthma   . Sleep apnea     Past Surgical History  Procedure Laterality Date  . Total abdominal hysterectomy    . Cholecystectomy    . Rotator cuff repair Right   . Cataract extraction w/phaco Left 06/30/2012    Procedure: CATARACT EXTRACTION PHACO AND INTRAOCULAR LENS PLACEMENT (IOC);  Surgeon: Gemma Payor, MD;  Location: [facility];  Service: Ophthalmology;  Laterality: Left;  CDE:12.32    ROS: PHYSICAL EXAM There were no vitals taken for this visit.  EKG:  ASSESSMENT AND PLAN

## 2012-12-29 ENCOUNTER — Telehealth: Payer: Self-pay | Admitting: Physician Assistant

## 2012-12-29 MED ORDER — ISOSORBIDE MONONITRATE ER 30 MG PO TB24: 30.0000 mg | ORAL_TABLET | Freq: Every day | ORAL | Status: DC

## 2012-12-29 NOTE — Telephone Encounter (Signed)
Received fax refill request  Rx # [id number] Medication:  Isosorb Mono 30mg  ER tab Qty 90 Sig:  Take one tablet by mouth once daily Physician:  Suezanne Cheshire

## 2012-12-29 NOTE — Telephone Encounter (Signed)
Medication sent via escribe.  

## 2013-01-05 ENCOUNTER — Other Ambulatory Visit: Payer: Self-pay | Admitting: Pulmonary Disease

## 2013-01-11 ENCOUNTER — Other Ambulatory Visit: Payer: Self-pay | Admitting: Pulmonary Disease

## 2013-01-20 ENCOUNTER — Ambulatory Visit: Payer: Medicare Other | Admitting: Pulmonary Disease

## 2013-01-30 ENCOUNTER — Other Ambulatory Visit: Payer: Self-pay | Admitting: Pulmonary Disease

## 2013-02-02 ENCOUNTER — Other Ambulatory Visit (INDEPENDENT_AMBULATORY_CARE_PROVIDER_SITE_OTHER): Payer: Medicare Other

## 2013-02-02 ENCOUNTER — Encounter: Payer: Self-pay | Admitting: Pulmonary Disease

## 2013-02-02 ENCOUNTER — Ambulatory Visit (INDEPENDENT_AMBULATORY_CARE_PROVIDER_SITE_OTHER): Payer: Medicare Other | Admitting: Pulmonary Disease

## 2013-02-02 VITALS — BP 140/76 | HR 83 | Temp 98.5°F | Ht 64.5 in | Wt 204.8 lb

## 2013-02-02 DIAGNOSIS — I10 Essential (primary) hypertension: Secondary | ICD-10-CM

## 2013-02-02 DIAGNOSIS — F411 Generalized anxiety disorder: Secondary | ICD-10-CM

## 2013-02-02 DIAGNOSIS — I872 Venous insufficiency (chronic) (peripheral): Secondary | ICD-10-CM

## 2013-02-02 DIAGNOSIS — E1140 Type 2 diabetes mellitus with diabetic neuropathy, unspecified: Secondary | ICD-10-CM

## 2013-02-02 DIAGNOSIS — M1990 Unspecified osteoarthritis, unspecified site: Secondary | ICD-10-CM

## 2013-02-02 DIAGNOSIS — E785 Hyperlipidemia, unspecified: Secondary | ICD-10-CM

## 2013-02-02 DIAGNOSIS — K219 Gastro-esophageal reflux disease without esophagitis: Secondary | ICD-10-CM

## 2013-02-02 DIAGNOSIS — J4489 Other specified chronic obstructive pulmonary disease: Secondary | ICD-10-CM

## 2013-02-02 DIAGNOSIS — E669 Obesity, unspecified: Secondary | ICD-10-CM

## 2013-02-02 DIAGNOSIS — I2510 Atherosclerotic heart disease of native coronary artery without angina pectoris: Secondary | ICD-10-CM

## 2013-02-02 DIAGNOSIS — M810 Age-related osteoporosis without current pathological fracture: Secondary | ICD-10-CM

## 2013-02-02 DIAGNOSIS — J449 Chronic obstructive pulmonary disease, unspecified: Secondary | ICD-10-CM

## 2013-02-02 DIAGNOSIS — E119 Type 2 diabetes mellitus without complications: Secondary | ICD-10-CM

## 2013-02-02 LAB — BASIC METABOLIC PANEL
BUN: 15 mg/dL (ref 6–23)
Calcium: 9 mg/dL (ref 8.4–10.5)
Chloride: 102 mEq/L (ref 96–112)
Creatinine, Ser: 1.2 mg/dL (ref 0.4–1.2)
GFR: 48.74 mL/min — ABNORMAL LOW (ref >60.00)
Glucose, Bld: 207 mg/dL — ABNORMAL HIGH (ref 70–99)

## 2013-02-02 LAB — HEMOGLOBIN A1C: Hgb A1c MFr Bld: 7.6 % — ABNORMAL HIGH (ref 4.6–6.5)

## 2013-02-02 MED ORDER — AZITHROMYCIN 250 MG PO TABS: ORAL_TABLET | ORAL | Status: DC

## 2013-02-02 MED ORDER — ALBUTEROL SULFATE HFA 108 (90 BASE) MCG/ACT IN AERS: 2.0000 | INHALATION_SPRAY | Freq: Four times a day (QID) | RESPIRATORY_TRACT | Status: DC | PRN

## 2013-02-02 NOTE — Patient Instructions (Signed)
Today we updated your med list in our EPIC system...    Continue your current medications the same...  Today we did your follow up FASTING diabetic blood work...    We will contact you w/ the results when available...   Let's get on track w/ our diabetic diet & exercise program...  Keep your mind active- newpapers, puzzles, etc...  We wrote new prescriptions for a ZPak & Proair inhaler to keep on hand...  Call for any questions...  Let's plan a follow up visit in 61mo, sooner if needed for problems.Marland KitchenMarland Kitchen

## 2013-02-02 NOTE — Progress Notes (Signed)
Subjective:    Patient ID: [name], female    DOB: [birth year], 72 y.o.   MRN: [id number]  HPI 72 y/o WF here for a follow up visit... [name] has 13 grandchildren & 18 great-grands!  She also has mult med problems including severe COPD; HBP; CAD w/prev MI & PTCA followed by DrMcDowell; VI; Hyperlipidemia; DM on insulin; Obesity; GERD/ constipation; DJD/ osteoporosis; Anxiety...  ~  June 07, 2011:  42mo ROV & [name] notes "I'm not doin" c/o depressed (Mother died recently) & angry at [facility]- wants another DME company;  States that she's hurting all over all the time...    She has severe COPD on O2, NEBS w/ AlbutQid & BudesBid, Mucinex, etc; breathing is at baseline DOE w/ ADLs & no real change...    BP controlled on 3 med regimen & she denies CP, palpit, dizzy/syncope, edema, etc...    Lipids under control w/ Simva80 & tol well; FLP 12/12 was ok x for elev TG & we reviewed low fat diet restriction... LABS 4/13:  Chems- ok x BS=202 A1c=7.9;  CBC- ok x Hg=11.6  ~  October 12, 2011:  42mo ROV & [name] is c/o head congestion, drainage, cough & thick whitish sput; denies fever, chills, sweats, discolored phlegm or hemoptysis; she has NEBS w/ Duoneb, Pulmicort, Mucinex, Flonase, MMW.; she is very distressed about being "stopped up" & wants ENT assessment so we will refer ==> she saw DrTeoh 9/13> chr nasal congestion, freq sore throats, hoarseness, post nasal drip; she had edematous mucosa, nasal turbinate hypertrophy, but no infection/ poilyps/ mass & latynx was ok; Rx w/ Saline irrigations, continue other meds & add Atrovent nasal 2spBid...    HBP controlled on Aten, Losar, Lasix; BP= 118/70 & she denies CP, palpit, change in SOB or edema...    CAD w/ prev MI & PTCAs folowed by DrMcDowell in [place] on above meds + ASA...    Dyslipidemia on Zocor80 w/ Chol numbers at goals but Tg still elev & she's been instructed on low fat diet as well...    DM treated w/ Metform & 70/30 insulin currently taking 28u Bid...    She  remains obese & is disappointed w/ her results on diet; wt= 206# & BMI~35; we reviewed diet, exercise, wt reduction strategies... LABS 8/13:  FLP- chol ok on Simva80 but TG=200;  Chems- ok x BS=157 A1c=7.8 on Metform+Insulin;  CBC- ok x Hg=11.6, Fe=59;  TSH=1.35;  VitD=38  ~  February 26, 2012:  42mo ROV & [name] is again c/o nasal congestion, thick phlegm in throat, cough w/ clear sput, on meds below; she had CT maxillofacial 9/13 w/ no signif sinus opacity noted, there was a socket left by right mandib wisdom tooth extraction w/ inflamm (tooth extracted 6/13); treated w/ PenVK, Vicodin, rec ret to Dentist...    HxSinusitis> on MMW, Flonase, OTC antihist prn; c.o chr congestion, no recent infection...    COPD> on O2, NEBS w/ Duoneb/ Budes, Mucinex2Bid; stable & denies ch in cough/ sput/ SOB, etc...    HBP> on Aten50, Losar50, Lasix40; labs 8/13 w/ normal lytes & renal; BP= 126/70 & she denies CP, palpit, ch in SOB, edema, etc...    CAD> on ASA81, NTG prn; followed by DrMcDowell- seen 10/13 & stable, no changes made...    VI> stable on low sodium diet, Lasix40, elevation, support hose...    Hyperlipid> on Simva80; last FLP 8/13 showed TChol 125, TG 200, HDL 39, LDL 46    DM> on 70/30  insulin 30Bid, Metform500Bid; doing well she says w/ BS=101 today at home; labs here 8/13 showed BS=157, A1c=7.8    Obesity> wt is 207# which is up 2# from last visit; we reviewed diet, exercise, wt reduction strategies...    GI- GERD, Constip> on Omep20; hx gastritis & prepyloric ulcers on EGD 3/12, rec to stay on the PPI Rx; last colon 2007 by DrJacobs was neg...    DJD> on Lodine400Bid prn, Tramadol50Tid prn, folowed by [facility]...    Osteoporosis> on Boniva150, clacium, MVI, VitD1000; labs 8/13 showed VitD=38; last BMD 5/13 w/ TScore -1.6 in hips...    Neuropathy> on Neurontin100Tid...    Anxiety> on Xanax0.5mg  prn...    Borderline Anemia> on Fe, B12 1015mcg/d; labs 8/13 showed Hg=11.6, MCV=93, Fe=59 (18%sat),  We  reviewed prob list, meds, xrays and labs> see below for updates >>   ~  Jul 16, 2012:  4-48mo ROV & post hosp check> [name] was eval recently by Cards for CP & an abn Myoview- Cath 5/14 revealed mod diffuse LAD disease w/ 50%proxLAD stenosis w/ a 60-70% in-stent restenosis in the midLAD; RCA was small w/ a 75% prox stenosis; LCx was dominant w/o focal stenosis but w/ mod diffuse calcium; HK of inferoapical wall w/ EF= 35-40% and mild-mod MR (Rec was for continued medical management)... We reviewed the following medical problems during today's office visit >>     HxSinusitis> on MMW, Flonase, OTC antihist prn; c/o chr congestion, no recent infection...    COPD> on O2, NEBS w/ Duoneb/ Budes, Mucinex2Bid; stable w/o exac & denies ch in cough/ sput/ SOB, etc...    HBP> on Aten50, Losar50, Lasix40; labs 5/14 w/ normal lytes & renal; BP= 126/82 & she denies CP, palpit, ch in SOB, edema, etc...    CAD> on ASA81, NTG prn; followed by DrMcDowell- seen 5/14 w/ abn Myoview & subseq Cath- AS ABOVE, med rx...    VI> stable on low sodium diet, Lasix40, elevation, support hose...    Hyperlipid> on Simva80; FLP 5/14 shows TChol 134, TG 227, HDL 33, LDL 74... Advised better low fat diet, exercise, & get wt down...    DM> on 70/30 insulin 30Bid, Metform500Bid; doing well she says; labs today showed BS=160, A1c=7.3 (sl improved- continue same + better diet)...    Obesity> wt is 201# which is down 6# from last visit; we reviewed diet, exercise, wt reduction strategies...    GI- GERD, Constip> on Omep20 & Probiotic; hx gastritis & prepyloric ulcers on EGD 3/12, rec to stay on the PPI Rx; last colon 2007 by DrJacobs was neg...    DJD> on Lodine400Bid prn, Tramadol50Tid prn, folowed by [facility]...    Osteoporosis> on Boniva150, calcium, MVI, VitD1000; labs 8/13 showed VitD=38; last BMD 5/13 w/ TScore -1.6 in hips...    Neuropathy> on Neurontin300Qhs...    Anxiety> on Xanax0.5mg  prn...    Hx Borderline Anemia> on Fe, B12  1027mcg/d; labs 5/14 showed Hg=13.2, MCV=90, last Fe was 8/13 = 59 (18%sat),  We reviewed prob list, meds, xrays and labs> see below for updates >>  LABS 5/14:  FLP- not at goals w/ TG=227, HDL=33;  Chems- ok x BS=160, A1c=7.3;  CBC- wnl;  TSH=2.09...  ~  November 14, 2012:  65mo ROV & [name] is added-on for URI w/ cough, thick yellow sput, feeling poorly, SOB, wheezing, etc;  We called in Augmentin & rec nasal saline- min improvement but persist symptoms;  Exam shows decr BS 7 end exp wheezing/ rhonchi;  She has  underlying COPD, ex-smoker, hx sinusitis, on NEBS, Mucinex;  Rec- Levaquin x10d, Depo80, Dosepak, incr Mucinex to 2Bid + fluids...     BP remains controlled on Aten, Losar, Lasix & measures 134/82;  She denies CP, palpit, edema;  Chol has been stable on Simva80 & she doesn't want to change;  DM controlled w/ 70/30 insulin on 30uBid, hasn't been able to lose weight stating "I can't starve myself"    We reviewed prob list, meds, xrays and labs> see below for updates >>   ~  February 02, 2013:  2-14mo ROV & [name] has recovered from her bronchitis exac & now back to baseline; she is concerned about her memory & notes that she is under a lot of stress...    Breathing is now stable on her NEBS w/ Albut/ Ipratrop/ Budes, Mucinex, Flonase, MMW...    BP is controlled on Aten50, Losar50, Lasix40 & measures 140/76 today... She denies CP, palpit, ch in SOB, etc on her ASA81 & Imdur30...     Chol is regulated w/ Simva40 Qhs- last FLP 5/14 showed sl elev TG & she is reminded to follow a strict low fat diet 7 work on weight reduction...    DM control is fair on her Metform500Bid and 70/30 insulin taking 30u Bid; Labs today showed BS=203, A1c= 7.6 & we rec that she incr the insulin to 35u Bid...    She remains on Prilosec20, [id number], Neurontin, Tramadol, Alprazolam as well...  We reviewed prob list, meds, xrays and labs> see below for updates >> she refuses the 2014 Flu vaccine... LABS 12/14:  chems- ok x BS=203,  A1c=7.6 on her Metform500Bid & 70/30 insulin 30uBid=> Rec to incr to 35uBid + diet & exercise...         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 Rx'd w/ MUCINEX, Nasal Saline, FLONASE...   ~  2/11:  c/o sinusitis & AUGMENTIN perscribed. ~  9/13:  ER visit w/ jaw pain- had wisdom tooth extracted 6/13 by DrLewis in [place]; CT showed socket left by right mandib molar extraction w/ inflamm; given PenVK, Vicodin, told f/u w/ dentist...  COPD (ICD-496) - she has COPD and is an ex-smoker... controlled on NEBULIZER w/ Duoneb Qid + Budesonide Bid, MUCINEX, etc... she has end stage disease w/ severe dyspnea w/ ADLs and no change over the last yr... ~  baseline CXR = COPD, incr heart size, NAD. ~ CT Chest 2/02 showed COPD, Atx, sl scarring, NAD. ~  bronchoscopy 3/02 w/ mucous plugging, no endobr lesions. ~  PFT's in 12/07 showed FVC=0.81 (27%), FEV1=0.54 (22%), %1sec=67, mid-flows=13%pred. ~  CXR 1/10 showed chr changes, cardiomeg, lingular atx. ~  CXR 2/11 showed cardiomeg, bibasilar scarring, NAD. ~  CXR 2/12 showed cardiomeg, COPD,scarring left base, NAD. ~  CXR 5/14 showed cardiomeg, mild basilar atx/ flattened diaph/ NAD, DJD in Tspine, s/p chole... ~  9/14:  Presents w/ URI & AB exac- unresponsive to Augmentin called in, & rec Levaquin, Depo, Dosepak, Mucinex, Fluids, etc...  HYPERTENSION (ICD-401.9) - controlled on ATENOLOL 50mg /d, LOSARTAN 50mg /d, LASIX 40mg /d... BP=126/70 and tol well... denies HA, visual changes, CP, palipit, dizziness, syncope, change in dyspnea, edema, etc. ~  Jan10: tried to change ACE to Diovan but too $$$ & requested change back to Lisinopril==> eventually changed to generic LOSARTAN... ~  5/14:  on Aten50, Losar50, Lasix40; labs 5/14 w/ normal lytes & renal; BP= 126/82 & she denies CP, palpit, ch in  SOB, edema, etc. ~  12/14: BP is controlled on Aten50, Losar50, Lasix40 & measures 140/76  today... She denies CP, palpit, ch in SOB, etc on her ASA81 & Imdur30.  CORONARY ARTERY DISEASE (ICD-414.00) - on ASA 81mg /d... hx of CAD w/ prev MI in 2000, and subseq PTCA's in 2000, & 2001... ~  last cath 2001 w/ PTCA to mid-LAD lesion w/ cutting balloon & brachytherapy for in-stent restenosis. ~  last hosp 6/08 w/ CP- felt to be non-cardiac... ~  last 2DECho 6/08 showed HK of inferoseptal area, EF=55%... ~  last NuclearStressTest 7/08 w/ mod ant/apical/septal infarct, apical HK, no ischemia, EF=?53%... ~  followed by [facility]McDowell at [facility]- notes reviewed... ~  4/13: Note from DrMcDowell> compliant w/ meds but not exercising, unsuccessful w/ wt loss, CAD/ BP/ Lipids- stable, no changes made... ~  1013: she saw DrMcDowell- mild chest congestion, tooth infection, on O2, not exercising enough, no CP etc; no changes made... ~  EKG 10/13 showed NSR, rate74, IVCD, poor R prog V1-3, NSSTTWA... ~  EKG 4/14 showed NSR, rate66, LAD, LVH, poor R progression, NSSTTWA... ~  Myoview 4/14 showed scar inferoapically, mild peri-infarct ischemia, EF reduced to 40% w/ wall motion abn noted... ~  Cath 5/14> mod diffuse LAD disease w/ 50%proxLAD stenosis w/ a 60-70% in-stent restenosis in the midLAD; RCA was small w/ a 75% prox stenosis; LCx was dominant w/o focal stenosis but w/ mod diffuse calcium; HK of inferoapical wall w/ EF= 35-40% and mild-mod MR (Rec was for continued medical management).  VENOUS INSUFFICIENCY (ICD-459.81) - low sodium diet, elevation, & Lasix 40-80mg /d... states she "allergic" to TED hose! and refuses to wear them...   HYPERLIPIDEMIA (ICD-272.4) - on ZOCOR 80mg /d + FISH OIL 1000mg /d ... she has been counselled on diet & exercise but unsuccessful... ~  FLP 11/08 on Simva40 showed TChol 150, TG 185, HDL 35, LDL 78 ~  FLP 3/09 on Simva40 showed TChol 139, TG 193, HDL 30, LDL 70... DrMcDowell incr to Simva80 9/09. ~  FLP 2/10 on Simva80 showed TChol 155, TG 137, HDL 43, LDL 85 ~   FLP 10/10 showed TChol 129, TG 96, HDL 39, LDL 71 ~  FLP 8/11 showed TChol 130, TG 152, HDL 30, LDL 70 ~  FLP 2/12 showed TChol 139, TG 96, HDL 40, LDL 80 ~  FLP 12/12 on Simva80 showed TChol 154, TG 221, HDL 39, LDL 81... LFTs remain wnl; rec> better low fat diet... ~  FLP 8/13 on Simva80 showed TChol 125, TG 200, HDL 39, LDL 46 ~  FLP 5/14 on Simva80 showed TChol 134, TG 227, HDL 33, LDL 74.  DM (ICD-250.00) - on HUMULIN 70/30- now taking 25uAM & 25uPM she says & GLUCOPHAGE 500mg Bid (off prev Avandia)... ~  BS=142 & HgA1c=6.6 in Nov08... ~  Labs 3/09 showed BS= 112, HgA1c= 6.8 ~  labs 7/09 showed BS= 197, HgA1c= 7.1.Marland KitchenMarland Kitchen rec- better diet! ~  labs 2/10 showed BS= 134, A1c= 7.0 ~  labs 10/10 showed BS= 151, A1c= 6.9 ~  labs 2/11 showed BS= 239, A1c= 8.1.Marland KitchenMarland Kitchen pt instructed to up titrate insulin til BS= 120-150 range. ~  labs 8/11 showed BS= 215, A1c= 8.4... again instructed to incr insulin slowly & titrate to BS values. ~  labs 2/12 showed BS= 236, A1c= 8.8.Marland KitchenMarland Kitchen rec to incr 70/30 insulin incrementally... ~  Labs 8/12 showed BS= 199, A1c= 9.2.Marland KitchenMarland Kitchen rec slowly incr her 70/30 insulin doses as discussed. ~  Labs 12/12 on 70/30Insulin25uBid+Metform500Bid showed BS= 237, A1c=  8.0... We discussed incr to 30uAM & 30uPM... ~  Labs 8/13 on 70/30Insulin28uBid+Metform500Bid showed BS= 157, A1c= 7.8.Marland KitchenMarland Kitchen rec incr 70/30 insulin to 30uBid... ~  5/14: on 70/30 insulin 30Bid, Metform500Bid; doing well she says; labs today showed BS=160, A1c=7.3 (sl improved- continue same + better diet). ~  Labs 12/14 on 70/30 insulin 30uBid & Meform500Bid showed BS=203, A1c=7.6.Marland KitchenMarland Kitchen rec to incr insulin to 35uBid...  OBESITY (ICD-278.00) - we discussed diet + exercise program required to lose weight! ~  weight 2008 = 235# ~  weight 2/10 = 250# ~  weight 10/10 = 258# ~  weight 2/11 = 241# ~  weight 8/11 = 217# ~  weight 2/12 = 215# ~  Weight 8/12 = 214# ~  Weight 12/12 = 209# ~  Weight 8/13 = 206# ~  Weight 12/13 = 207# ~  Weight  5/14 = 201# ~  Weight 12/14 = 205#  GERD (ICD-530.81) - on PPI therapy (changed 2/10 per request to OMEPRAZOLE20mg /d)... ~  [facility] 3[facility]12 for COPD exac & was anemic w/ heme pos stool & GIB; EGD by DrJacobs w/ gastritis & prepyloric ulcers, +HPylori- treated & improved.  CONSTIPATION (ICD-564.00) - last colonoscopy was 11/07 by DrJacobs and negative...  DEGENERATIVE JOINT DISEASE (ICD-715.90) - prev CSpine films w/ spondy and biforaminal narrowing... LSSpine films w/ DDD and osteopenia...  ~  she [place] DrDrake, [place], for shot in right foot. ~  9/11:  she saw DrNorris for shot in right knee. ~  8/12: presents c/o LBP, leg pain; XRay Lumbar spine w/ DDD, narrowing, osteophytes, facet dis, etc; Rx w/ rest, heat, TRAMADOL, ROBAXIN, refer to Ortho. ~  She has followed up w/ DrApplington> not a surg candidate for her lumbar spondylosis & sp stenosis...  OSTEOPOROSIS (ICD-733.00) - on BONIVA 150mg /mo, Calcium, Vits... she needs a BMD... ~  labs 2/11 showed Vit D level = 27... OK to change to 1000 u daily. ~  labs 2/12 showed Vit D level = 37... rec to continue 1000u supplement ~  BMD 5/13 showed TScore -1.6 in hips & rec to continue Boniva, Calcium, MVI, VitD... ~  Labs 8/13 showed Vit D level = 38... Continue supplement.  ANXIETY (ICD-300.00) - uses Alprazolam 0.5mg  as needed...  ANEMIA, MILD (ICD-285.9) ~  labs 2/10 showed Hg= 11.9, Fe= 51... ~  labs 10/10 showed Hg= 11.9, Fe= 50... rec OTC Fe supplement. ~  labs 2/11 showed Hg= 13.4, Fe= 52 (14%sat) ~  labs 2/12 showed Hg= 13.1, MCV= 93 ~  Labs 8/12 showed Hg= 12.0, Fe= 61 (15%sat); rec to continue oral iron... ~  Labs 8/13 showed Hg= 11.6, Fe= 59; rec to continue oral iron supplement. ~  Labs 5/14 showed Hg= 13.2  Health Maintenance - she hasn't seen GYN in yrs and is asked to re-establish... prev refused Mammograms due to the discomfort & bruising- she will discuss w/ Gyn & get a follow up BMD as well...   Past Surgical History   Procedure Laterality Date  . Total abdominal hysterectomy    . Cholecystectomy    . Rotator cuff repair Right   . Cataract extraction w/phaco Left 06/30/2012    Procedure: CATARACT EXTRACTION PHACO AND INTRAOCULAR LENS PLACEMENT (IOC);  Surgeon: Gemma Payor, MD;  Location: [facility];  Service: Ophthalmology;  Laterality: Left;  CDE:12.32    Outpatient Encounter Prescriptions as of 02/02/2013  Medication Sig  . acetaminophen (TYLENOL) 500 MG tablet Take 500 mg by mouth every 6 (six) hours as needed. pain  .  albuterol (PROVENTIL) (2.5 MG/3ML) 0.083% nebulizer solution Take 3 mLs (2.5 mg total) by nebulization 4 (four) times daily.  Marland Kitchen ALPRAZolam (XANAX) 0.5 MG tablet TAKE ONE -HALF TO ONE TABLET BY MOUTH THREE TIMES DAILY AS NEEDED  . Alum & Mag Hydroxide-Simeth (MAGIC MOUTHWASH) SOLN 1 tsp gargle and swallow four times daily as needed for sore throat  . aspirin 81 MG tablet Take 81 mg by mouth daily.  Marland Kitchen atenolol (TENORMIN) 50 MG tablet Take 1 tablet (50 mg total) by mouth daily.  Marland Kitchen BESIVANCE 0.6 % SUSP Place 1 drop into both eyes 4 (four) times daily.  . budesonide (PULMICORT) 0.25 MG/2ML nebulizer solution Take 2 mLs (0.25 mg total) by nebulization 2 (two) times daily.  . calcium-vitamin D (OSCAL WITH D) 500-200 MG-UNIT per tablet Take 1 tablet by mouth 2 (two) times daily.    . Cholecalciferol (VITAMIN D3) 1000 UNITS CAPS Take by mouth daily.    Marland Kitchen dextromethorphan-guaiFENesin (MUCINEX DM) 30-600 MG per 12 hr tablet Take 2 tablets by mouth every 12 (twelve) hours.    . DUREZOL 0.05 % EMUL Place 1 drop into both eyes 4 (four) times daily.  Marland Kitchen etodolac (LODINE) 400 MG tablet TAKE ONE TABLET BY MOUTH EVERY 12 HOURS  . ferrous sulfate 325 (65 FE) MG tablet Take 325 mg by mouth daily with breakfast.   . fluticasone (FLONASE) 50 MCG/ACT nasal spray USE TWO SPRAY IN EACH NOSTRIL ONCE DAILY  . furosemide (LASIX) 40 MG tablet TAKE ONE TABLET BY MOUTH EVERY DAY  . gabapentin (NEURONTIN) 300 MG capsule Take  1 capsule (300 mg total) by mouth 3 (three) times daily.  Marland Kitchen ibandronate (BONIVA) 150 MG tablet TAKE ONE TABLET BY MOUTH ONCE A MONTH  . insulin NPH-regular (HUMULIN 70/30) (70-30) 100 UNIT/ML injection INJECT 30 UNITS IN THE MORNING AND 30  UNITS IN THE EVENING.  Marland Kitchen ipratropium (ATROVENT) 0.02 % nebulizer solution Take 2.5 mLs (500 mcg total) by nebulization 4 (four) times daily.  . isosorbide mononitrate (IMDUR) 30 MG 24 hr tablet Take 1 tablet (30 mg total) by mouth daily.  Marland Kitchen losartan (COZAAR) 50 MG tablet TAKE ONE TABLET BY MOUTH ONCE DAILY  . metFORMIN (GLUCOPHAGE) 500 MG tablet TAKE ONE TABLET BY MOUTH TWICE DAILY  . nitroGLYCERIN (NITROSTAT) 0.4 MG SL tablet Place 1 tablet (0.4 mg total) under the tongue every 5 (five) minutes as needed. May repeat x3  . omeprazole (PRILOSEC) 20 MG capsule TAKE ONE CAPSULE BY MOUTH EVERY DAY 30 MINUTES BEFORE A MEAL  . ONE TOUCH LANCETS MISC Check blood sugars two times daily  . Probiotic Product (PROBIOTIC DAILY PO) Take 1 tablet by mouth daily.   Marland Kitchen PROLENSA 0.07 % SOLN Place 1 drop into both eyes 4 (four) times daily.  . simvastatin (ZOCOR) 80 MG tablet TAKE ONE TABLET BY MOUTH EVERY DAY AT BEDTIME  . traMADol (ULTRAM) 50 MG tablet TAKE ONE TABLET BY MOUTH EVERY 8 HOURS AS NEEDED FOR PAIN  . vitamin B-12 (CYANOCOBALAMIN) 500 MCG tablet Take 1,000 mcg by mouth daily.   . [DISCONTINUED] levofloxacin (LEVAQUIN) 500 MG tablet Take 1 tablet (500 mg total) by mouth daily.  . [DISCONTINUED] methylPREDNIsolone (MEDROL DOSPACK) 4 MG tablet follow package directions     Allergies  Allergen Reactions  . Codeine     vomiting  . Terbutaline Sulfate     REACTION: INTOL to terbutaline    Current Medications, Allergies, Past Medical History, Past Surgical History, Family History, and Social History were reviewed  in [employer] record.    Review of Systems         See HPI - all other systems neg except as noted...   The patient complains  of dyspnea on exertion, peripheral edema, muscle weakness, and difficulty walking.  The patient denies anorexia, fever, weight loss, weight gain, vision loss, decreased hearing, hoarseness, chest pain, syncope, prolonged cough, headaches, hemoptysis, abdominal pain, melena, hematochezia, severe indigestion/heartburn, hematuria, incontinence, suspicious skin lesions, transient blindness, depression, unusual weight change, abnormal bleeding, enlarged lymph nodes, and angioedema.    Objective:   Physical Exam    WD, Obese, 72 y/o WF in NAD... GENERAL:  Alert & oriented; pleasant & cooperative...  HEENT:  [place]/AT, EOM-wnl, PERRLA, EACs-clear, TMs-wnl, NOSE-clear, THROAT-clear & wnl. NECK:  Supple w/ fairROM; no JVD; normal carotid impulses w/o bruits; no thyromegaly or nodules palpated; no lymphadenopathy. CHEST:  Decr BS bilat, few scat rhonchi, without wheezing, rales, or rubs...upper airway psuedo-wheezing HEART:  Regular Rhythm;  gr 1/6 SEM without rubs or gallops detected... ABDOMEN:  Obese w/ panniculus, soft & nontender; normal bowel sounds; no organomegaly or masses detected. EXT: without deformities, mild arthritic changes; no varicose veins/ +venous insuffic/ tr edema. Sl tender on palpation over lumbar area & siatic notch... NEURO:  CN's intact; no focal neuro deficits... DERM:  no lesion seen...  RADIOLOGY DATA:  Reviewed in the EPIC EMR & discussed w/ the patient...  LABORATORY DATA:  Reviewed in the EPIC EMR & discussed w/ the patient...   Assessment & Plan:    COPD>  continues home Oxygen, Nebulizer, Mucinex, & Fluids;  Exac treated  w/ Levaquin, Depo80, Dosepak, & resolved...  HBP>  Good control on Atenolol50, Losartan50, Furosemide40; discussed diet, must get wt down, no sodium...  CAD>  Followed by DrMcDowell in [place] office- recent cath reviewed, no option for surg or intervention, continue medical management...  HYPERLIPID>  On Simva80 + Fish Oil; needs better diet  & get wt down!  DM>  Control remains fair as she has been unable to diet effectively, lose weight, etc; now on 70/30 insulin taking 30u Bid & A1c is 7.6 therefore rec incr to 35u Bid...  OBESITY>  Weight reduction is key to improvement in all areas...  GI> GERD, Ulcers, +HPylori, Constip>  Improved after HPylori Rx & back on Omep 20mg /d...  DJD, LBP, etc>  Lumbar XRay reviewed, trial Tramadol, Robaxin, rest/ heat/ etc... Refer to Ortho for further eval & Rx...  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  Anxiety>  Alprazolam for prn use- refilled...  Anemia>  On Fe daily and Hg is improved (s/p hosp w/ GIB)...   Patient's Medications  New Prescriptions   ALBUTEROL (PROVENTIL HFA;VENTOLIN HFA) 108 (90 BASE) MCG/ACT INHALER    Inhale 2 puffs into the lungs every 6 (six) hours as needed for wheezing or shortness of breath.   AZITHROMYCIN (ZITHROMAX Z-PAK) 250 MG TABLET    Take as directed  Previous Medications   ACETAMINOPHEN (TYLENOL) 500 MG TABLET    Take 500 mg by mouth every 6 (six) hours as needed. pain   ALBUTEROL (PROVENTIL) (2.5 MG/3ML) 0.083% NEBULIZER SOLUTION    Take 3 mLs (2.5 mg total) by nebulization 4 (four) times daily.   ALPRAZOLAM (XANAX) 0.5 MG TABLET    TAKE ONE -HALF TO ONE TABLET BY MOUTH THREE TIMES DAILY AS NEEDED   ALUM & MAG HYDROXIDE-SIMETH (MAGIC MOUTHWASH) SOLN    1 tsp  gargle and swallow four times daily as needed for sore throat   ASPIRIN 81 MG TABLET    Take 81 mg by mouth daily.   ATENOLOL (TENORMIN) 50 MG TABLET    Take 1 tablet (50 mg total) by mouth daily.   BESIVANCE 0.6 % SUSP    Place 1 drop into both eyes 4 (four) times daily.   BUDESONIDE (PULMICORT) 0.25 MG/2ML NEBULIZER SOLUTION    Take 2 mLs (0.25 mg total) by nebulization 2 (two) times daily.   CALCIUM-VITAMIN D (OSCAL WITH D) 500-200 MG-UNIT PER TABLET    Take 1 tablet by mouth 2 (two) times daily.     CHOLECALCIFEROL (VITAMIN D3) 1000 UNITS CAPS    Take  by mouth daily.     DEXTROMETHORPHAN-GUAIFENESIN (MUCINEX DM) 30-600 MG PER 12 HR TABLET    Take 2 tablets by mouth every 12 (twelve) hours.     DUREZOL 0.05 % EMUL    Place 1 drop into both eyes 4 (four) times daily.   ETODOLAC (LODINE) 400 MG TABLET    TAKE ONE TABLET BY MOUTH EVERY 12 HOURS   FERROUS SULFATE 325 (65 FE) MG TABLET    Take 325 mg by mouth daily with breakfast.    FLUTICASONE (FLONASE) 50 MCG/ACT NASAL SPRAY    USE TWO SPRAY IN EACH NOSTRIL ONCE DAILY   FUROSEMIDE (LASIX) 40 MG TABLET    TAKE ONE TABLET BY MOUTH EVERY DAY   GABAPENTIN (NEURONTIN) 300 MG CAPSULE    Take 1 capsule (300 mg total) by mouth 3 (three) times daily.   IBANDRONATE (BONIVA) 150 MG TABLET    TAKE ONE TABLET BY MOUTH ONCE A MONTH   IPRATROPIUM (ATROVENT) 0.02 % NEBULIZER SOLUTION    Take 2.5 mLs (500 mcg total) by nebulization 4 (four) times daily.   ISOSORBIDE MONONITRATE (IMDUR) 30 MG 24 HR TABLET    Take 1 tablet (30 mg total) by mouth daily.   METFORMIN (GLUCOPHAGE) 500 MG TABLET    TAKE ONE TABLET BY MOUTH TWICE DAILY   NITROGLYCERIN (NITROSTAT) 0.4 MG SL TABLET    Place 1 tablet (0.4 mg total) under the tongue every 5 (five) minutes as needed. May repeat x3   OMEPRAZOLE (PRILOSEC) 20 MG CAPSULE    TAKE ONE CAPSULE BY MOUTH EVERY DAY 30 MINUTES BEFORE A MEAL   ONE TOUCH LANCETS MISC    Check blood sugars two times daily   PROBIOTIC PRODUCT (PROBIOTIC DAILY PO)    Take 1 tablet by mouth daily.    PROLENSA 0.07 % SOLN    Place 1 drop into both eyes 4 (four) times daily.   SIMVASTATIN (ZOCOR) 80 MG TABLET    TAKE ONE TABLET BY MOUTH EVERY DAY AT BEDTIME   TRAMADOL (ULTRAM) 50 MG TABLET    TAKE ONE TABLET BY MOUTH EVERY 8 HOURS AS NEEDED FOR PAIN   VITAMIN B-12 (CYANOCOBALAMIN) 500 MCG TABLET    Take 1,000 mcg by mouth daily.   Modified Medications   Modified Medication Previous Medication   INSULIN NPH-REGULAR (NOVOLIN 70/30) (70-30) 100 UNIT/ML INJECTION insulin NPH-regular (HUMULIN 70/30) (70-30) 100  UNIT/ML injection      INJECT 35 UNITS IN THE MORNING AND 35  UNITS IN THE EVENING.    INJECT 30 UNITS IN THE MORNING AND 30  UNITS IN THE EVENING.   LOSARTAN (COZAAR) 50 MG TABLET losartan (COZAAR) 50 MG tablet      TAKE ONE TABLET BY MOUTH ONCE DAILY  TAKE ONE TABLET BY MOUTH ONCE DAILY  Discontinued Medications   LEVOFLOXACIN (LEVAQUIN) 500 MG TABLET    Take 1 tablet (500 mg total) by mouth daily.   LOSARTAN (COZAAR) 50 MG TABLET    TAKE ONE TABLET BY MOUTH ONCE DAILY   METHYLPREDNISOLONE (MEDROL DOSPACK) 4 MG TABLET    follow package directions

## 2013-02-13 ENCOUNTER — Other Ambulatory Visit: Payer: Self-pay | Admitting: Pulmonary Disease

## 2013-03-02 ENCOUNTER — Other Ambulatory Visit: Payer: Self-pay | Admitting: Pulmonary Disease

## 2013-03-16 ENCOUNTER — Ambulatory Visit: Payer: Medicare Other | Admitting: Cardiology

## 2013-03-19 ENCOUNTER — Encounter: Payer: Self-pay | Admitting: Cardiology

## 2013-03-19 ENCOUNTER — Ambulatory Visit (INDEPENDENT_AMBULATORY_CARE_PROVIDER_SITE_OTHER): Payer: Medicare Other | Admitting: Cardiology

## 2013-03-19 VITALS — BP 148/78 | HR 82 | Ht 64.0 in | Wt 222.0 lb

## 2013-03-19 DIAGNOSIS — E785 Hyperlipidemia, unspecified: Secondary | ICD-10-CM

## 2013-03-19 DIAGNOSIS — I2510 Atherosclerotic heart disease of native coronary artery without angina pectoris: Secondary | ICD-10-CM

## 2013-03-19 NOTE — Assessment & Plan Note (Signed)
Symptomatically stable on current medical regimen. Cardiac catheterization from May 2014 is reviewed above. Continue observation, 6 month followup arranged.

## 2013-03-19 NOTE — Assessment & Plan Note (Signed)
She continues on Zocor with followup per Dr. Kriste BasqueNadel.

## 2013-03-19 NOTE — Patient Instructions (Signed)
Your physician recommends that you schedule a follow-up appointment in: 6 months with Dr Diona BrownerMcDowell Your physician recommends that you have follow up lab work, we will mail you a reminder letter to alert you when to go [facility], located across the street from our office.   Your physician recommends that you continue on your current medications as directed. Please refer to the Current Medication list given to you today.

## 2013-03-19 NOTE — Progress Notes (Signed)
Clinical Summary [name] is a 73 y.o.female last seen in June 2014. She continues with interval followup with Dr. Kriste Becker, most recently seen in December 2014. She seems to be doing relatively well. Occasional episodes of angina but no increasing nitroglycerin use on current medical regimen. She has not been hospitalized recently, no recent cold or flulike symptoms.  Cardiac catheterization in May 2014 demonstrated 50-75% stenoses within the proximal to mid LAD, occluded diagonal, no significant circumflex distribution disease, mild RCA disease, patent SVG to diagonal with 30-40% distal anastomotic stenosis, patent LIMA to LAD, LVEF 65%. Based on this anatomy, medical therapy was recommended.  We reviewed her current cardiac medications.   Allergies  Allergen Reactions  . Codeine     vomiting  . Terbutaline Sulfate     REACTION: INTOL to terbutaline    Current Outpatient Prescriptions  Medication Sig Dispense Refill  . vitamin C (ASCORBIC ACID) 500 MG tablet Take 500 mg by mouth daily.      Marland Kitchen. acetaminophen (TYLENOL) 500 MG tablet Take 500 mg by mouth every 6 (six) hours as needed. pain      . albuterol (PROVENTIL HFA;VENTOLIN HFA) 108 (90 BASE) MCG/ACT inhaler Inhale 2 puffs into the lungs every 6 (six) hours as needed for wheezing or shortness of breath.  1 Inhaler  6  . albuterol (PROVENTIL) (2.5 MG/3ML) 0.083% nebulizer solution Take 3 mLs (2.5 mg total) by nebulization 4 (four) times daily.  75 mL  11  . ALPRAZolam (XANAX) 0.5 MG tablet TAKE ONE -HALF TO ONE TABLET BY MOUTH THREE TIMES DAILY AS NEEDED  90 tablet  5  . Alum & Mag Hydroxide-Simeth (MAGIC MOUTHWASH) SOLN 1 tsp gargle and swallow four times daily as needed for sore throat  120 mL  5  . aspirin 81 MG tablet Take 81 mg by mouth daily.      Marland Kitchen. atenolol (TENORMIN) 50 MG tablet Take 1 tablet (50 mg total) by mouth daily.  30 tablet  11  . budesonide (PULMICORT) 0.25 MG/2ML nebulizer solution Take 2 mLs (0.25 mg total) by  nebulization 2 (two) times daily.  60 mL  11  . calcium-vitamin D (OSCAL WITH D) 500-200 MG-UNIT per tablet Take 1 tablet by mouth 2 (two) times daily.        . Cholecalciferol (VITAMIN D3) 1000 UNITS CAPS Take by mouth daily.        Marland Kitchen. dextromethorphan-guaiFENesin (MUCINEX DM) 30-600 MG per 12 hr tablet Take 2 tablets by mouth every 12 (twelve) hours.        Marland Kitchen. etodolac (LODINE) 400 MG tablet TAKE ONE TABLET BY MOUTH EVERY 12 HOURS  60 tablet  3  . ferrous sulfate 325 (65 FE) MG tablet Take 325 mg by mouth daily with breakfast.       . fluticasone (FLONASE) 50 MCG/ACT nasal spray USE TWO SPRAY IN EACH NOSTRIL ONCE DAILY  16 g  6  . furosemide (LASIX) 40 MG tablet TAKE ONE TABLET BY MOUTH EVERY DAY  30 tablet  6  . gabapentin (NEURONTIN) 300 MG capsule Take 1 capsule (300 mg total) by mouth 3 (three) times daily.  90 capsule  0  . ibandronate (BONIVA) 150 MG tablet TAKE ONE TABLET BY MOUTH ONCE A MONTH  1 tablet  6  . insulin NPH-regular (NOVOLIN 70/30) (70-30) 100 UNIT/ML injection INJECT 35 UNITS IN THE MORNING AND 35  UNITS IN THE EVENING.      Marland Kitchen. ipratropium (ATROVENT) 0.02 % nebulizer  solution Take 2.5 mLs (500 mcg total) by nebulization 4 (four) times daily.  75 mL  11  . isosorbide mononitrate (IMDUR) 30 MG 24 hr tablet Take 1 tablet (30 mg total) by mouth daily.  90 tablet  1  . losartan (COZAAR) 50 MG tablet TAKE ONE TABLET BY MOUTH ONCE DAILY  30 tablet  6  . metFORMIN (GLUCOPHAGE) 500 MG tablet TAKE ONE TABLET BY MOUTH TWICE DAILY  60 tablet  6  . nitroGLYCERIN (NITROSTAT) 0.4 MG SL tablet Place 1 tablet (0.4 mg total) under the tongue every 5 (five) minutes as needed. May repeat x3  25 tablet  6  . omeprazole (PRILOSEC) 20 MG capsule TAKE ONE CAPSULE BY MOUTH EVERY DAY 30 MINUTES BEFORE A MEAL  30 capsule  6  . ONE TOUCH LANCETS MISC Check blood sugars two times daily  100 each  6  . Probiotic Product (PROBIOTIC DAILY PO) Take 1 tablet by mouth daily.       Marland Kitchen PROLENSA 0.07 % SOLN Place 1  drop into both eyes 4 (four) times daily.      . simvastatin (ZOCOR) 80 MG tablet TAKE ONE TABLET BY MOUTH EVERY DAY AT BEDTIME  30 tablet  6  . traMADol (ULTRAM) 50 MG tablet TAKE ONE TABLET BY MOUTH EVERY 8 HOURS AS NEEDED FOR PAIN  90 tablet  5  . vitamin B-12 (CYANOCOBALAMIN) 500 MCG tablet Take 1,000 mcg by mouth daily.        No current facility-administered medications for this visit.    Past Medical History  Diagnosis Date  . COPD (chronic obstructive pulmonary disease)   . Hyperlipidemia   . Essential hypertension, benign   . Anemia   . Anxiety   . Arthritis   . Coronary atherosclerosis of native coronary artery     PTCA/BMS LAD 2000, PTCA/brachytherapy LAD 2001, LVEF 55%  . Type 2 diabetes mellitus   . Osteoporosis   . Venous insufficiency   . Obesity   . Myocardial infarction     AMI 2000  . Asthma   . Sleep apnea     Social History [name] reports that she quit smoking about 15 years ago. Her smoking use included Cigarettes. She has a 30 pack-year smoking history. She does not have any smokeless tobacco history on file. [name] reports that she does not drink alcohol.  Review of Systems No palpitations, syncope. No bleeding episodes.  Physical Examination Filed Vitals:   03/19/13 1012  BP: 148/78  Pulse: 82   Filed Weights   03/19/13 1012  Weight: 222 lb (100.699 kg)    No acute distress. Wearing oxygen via nasal cannula.  HEENT: Conjunctiva and lids normal, oropharynx with moist mucosa.  Neck: Supple, no elevated JVP or bruits.  Lungs: Diminished breath sounds throughout, prolonged expiratory phase, nonlabored. Cardiac: Regular rate and rhythm with occasional ectopic beat, no S3 gallop or rub.  Abdomen: Soft, nontender, bowel sounds present.  Skin: Warm and dry.  Extremities: No pitting edema.    Problem List and Plan   CORONARY ATHEROSCLEROSIS NATIVE CORONARY ARTERY Symptomatically stable on current medical regimen. Cardiac catheterization  from May 2014 is reviewed above. Continue observation, 6 month followup arranged.  HYPERLIPIDEMIA She continues on Zocor with followup per Dr. Kriste Basque.    Jonelle Sidle, M.D., F.A.C.C.

## 2013-03-31 ENCOUNTER — Other Ambulatory Visit: Payer: Self-pay | Admitting: Pulmonary Disease

## 2013-04-02 ENCOUNTER — Other Ambulatory Visit: Payer: Self-pay | Admitting: Pulmonary Disease

## 2013-04-02 MED ORDER — GLUCOSE BLOOD VI STRP: ORAL_STRIP | Status: DC

## 2013-04-15 ENCOUNTER — Other Ambulatory Visit: Payer: Self-pay

## 2013-04-15 MED ORDER — GLUCOSE BLOOD VI STRP: ORAL_STRIP | Status: DC

## 2013-04-24 ENCOUNTER — Other Ambulatory Visit: Payer: Self-pay | Admitting: Pulmonary Disease

## 2013-04-29 ENCOUNTER — Other Ambulatory Visit: Payer: Self-pay | Admitting: Pulmonary Disease

## 2013-06-01 ENCOUNTER — Other Ambulatory Visit: Payer: Self-pay | Admitting: Pulmonary Disease

## 2013-06-02 ENCOUNTER — Emergency Department (HOSPITAL_COMMUNITY): Payer: Medicare Other

## 2013-06-02 ENCOUNTER — Emergency Department (HOSPITAL_COMMUNITY)
Admission: EM | Admit: 2013-06-02 | Discharge: 2013-06-02 | Disposition: A | Discharge status: Home / Self Care | Payer: Medicare Other | Attending: Emergency Medicine | Admitting: Emergency Medicine

## 2013-06-02 ENCOUNTER — Telehealth: Payer: Self-pay | Admitting: Pulmonary Disease

## 2013-06-02 ENCOUNTER — Encounter (HOSPITAL_COMMUNITY): Payer: Self-pay | Admitting: Emergency Medicine

## 2013-06-02 DIAGNOSIS — E785 Hyperlipidemia, unspecified: Secondary | ICD-10-CM | POA: Insufficient documentation

## 2013-06-02 DIAGNOSIS — M810 Age-related osteoporosis without current pathological fracture: Secondary | ICD-10-CM | POA: Insufficient documentation

## 2013-06-02 DIAGNOSIS — J449 Chronic obstructive pulmonary disease, unspecified: Secondary | ICD-10-CM | POA: Insufficient documentation

## 2013-06-02 DIAGNOSIS — M545 Low back pain, unspecified: Secondary | ICD-10-CM | POA: Insufficient documentation

## 2013-06-02 DIAGNOSIS — I10 Essential (primary) hypertension: Secondary | ICD-10-CM | POA: Insufficient documentation

## 2013-06-02 DIAGNOSIS — IMO0002 Reserved for concepts with insufficient information to code with codable children: Secondary | ICD-10-CM | POA: Insufficient documentation

## 2013-06-02 DIAGNOSIS — Z794 Long term (current) use of insulin: Secondary | ICD-10-CM | POA: Insufficient documentation

## 2013-06-02 DIAGNOSIS — J4489 Other specified chronic obstructive pulmonary disease: Secondary | ICD-10-CM | POA: Insufficient documentation

## 2013-06-02 DIAGNOSIS — Z79899 Other long term (current) drug therapy: Secondary | ICD-10-CM | POA: Insufficient documentation

## 2013-06-02 DIAGNOSIS — Z9981 Dependence on supplemental oxygen: Secondary | ICD-10-CM | POA: Insufficient documentation

## 2013-06-02 DIAGNOSIS — E669 Obesity, unspecified: Secondary | ICD-10-CM | POA: Insufficient documentation

## 2013-06-02 DIAGNOSIS — D649 Anemia, unspecified: Secondary | ICD-10-CM | POA: Insufficient documentation

## 2013-06-02 DIAGNOSIS — Z90710 Acquired absence of both cervix and uterus: Secondary | ICD-10-CM | POA: Insufficient documentation

## 2013-06-02 DIAGNOSIS — Z7982 Long term (current) use of aspirin: Secondary | ICD-10-CM | POA: Insufficient documentation

## 2013-06-02 DIAGNOSIS — F411 Generalized anxiety disorder: Secondary | ICD-10-CM | POA: Insufficient documentation

## 2013-06-02 DIAGNOSIS — E119 Type 2 diabetes mellitus without complications: Secondary | ICD-10-CM | POA: Insufficient documentation

## 2013-06-02 DIAGNOSIS — Z9861 Coronary angioplasty status: Secondary | ICD-10-CM | POA: Insufficient documentation

## 2013-06-02 DIAGNOSIS — G4730 Sleep apnea, unspecified: Secondary | ICD-10-CM | POA: Insufficient documentation

## 2013-06-02 DIAGNOSIS — Z87891 Personal history of nicotine dependence: Secondary | ICD-10-CM | POA: Insufficient documentation

## 2013-06-02 DIAGNOSIS — I2510 Atherosclerotic heart disease of native coronary artery without angina pectoris: Secondary | ICD-10-CM | POA: Insufficient documentation

## 2013-06-02 HISTORY — DX: Other intervertebral disc degeneration, lumbar region: M51.36

## 2013-06-02 HISTORY — DX: Other intervertebral disc degeneration, lumbar region without mention of lumbar back pain or lower extremity pain: M51.369

## 2013-06-02 LAB — URINALYSIS, ROUTINE W REFLEX MICROSCOPIC
Bilirubin Urine: NEGATIVE
Glucose, UA: NEGATIVE mg/dL
KETONES UR: NEGATIVE mg/dL
NITRITE: NEGATIVE
Protein, ur: NEGATIVE mg/dL
SPECIFIC GRAVITY, URINE: 1.015 (ref 1.005–1.030)
Urobilinogen, UA: 0.2 mg/dL (ref 0.0–1.0)
pH: 6 (ref 5.0–8.0)

## 2013-06-02 LAB — COMPREHENSIVE METABOLIC PANEL
ALK PHOS: 55 U/L (ref 39–117)
ALT: 17 U/L (ref 0–35)
AST: 15 U/L (ref 0–37)
Albumin: 3.9 g/dL (ref 3.5–5.2)
BUN: 13 mg/dL (ref 6–23)
CALCIUM: 9.6 mg/dL (ref 8.4–10.5)
CO2: 30 mEq/L (ref 19–32)
Chloride: 94 mEq/L — ABNORMAL LOW (ref 96–112)
Creatinine, Ser: 1.05 mg/dL (ref 0.50–1.10)
GFR calc non Af Amer: 52 mL/min — ABNORMAL LOW (ref >90)
GFR, EST AFRICAN AMERICAN: 60 mL/min — AB (ref >90)
GLUCOSE: 208 mg/dL — AB (ref 70–99)
Potassium: 4 mEq/L (ref 3.7–5.3)
Sodium: 137 mEq/L (ref 137–147)
TOTAL PROTEIN: 7 g/dL (ref 6.0–8.3)
Total Bilirubin: 0.4 mg/dL (ref 0.3–1.2)

## 2013-06-02 LAB — CBC WITH DIFFERENTIAL/PLATELET
Basophils Absolute: 0 10*3/uL (ref 0.0–0.1)
Basophils Relative: 0 % (ref 0–1)
EOS ABS: 0.1 10*3/uL (ref 0.0–0.7)
Eosinophils Relative: 1 % (ref 0–5)
HEMATOCRIT: 41.7 % (ref 36.0–46.0)
Hemoglobin: 13.9 g/dL (ref 12.0–15.0)
LYMPHS ABS: 1.2 10*3/uL (ref 0.7–4.0)
Lymphocytes Relative: 15 % (ref 12–46)
MCH: 30.3 pg (ref 26.0–34.0)
MCHC: 33.3 g/dL (ref 30.0–36.0)
MCV: 91 fL (ref 78.0–100.0)
MONOS PCT: 9 % (ref 3–12)
Monocytes Absolute: 0.7 10*3/uL (ref 0.1–1.0)
Neutro Abs: 5.5 10*3/uL (ref 1.7–7.7)
Neutrophils Relative %: 75 % (ref 43–77)
Platelets: 242 10*3/uL (ref 150–400)
RBC: 4.58 MIL/uL (ref 3.87–5.11)
RDW: 11.9 % (ref 11.5–15.5)
WBC: 7.5 10*3/uL (ref 4.0–10.5)

## 2013-06-02 LAB — URINE MICROSCOPIC-ADD ON

## 2013-06-02 LAB — LIPASE, BLOOD: Lipase: 21 U/L (ref 11–59)

## 2013-06-02 LAB — TROPONIN I: Troponin I: <0.3 ng/mL (ref <0.30)

## 2013-06-02 MED ORDER — SODIUM CHLORIDE 0.9 % IV SOLN
INTRAVENOUS | Status: DC
  Administered 2013-06-02: 14:00:00 via INTRAVENOUS

## 2013-06-02 MED ORDER — IOHEXOL 300 MG/ML  SOLN
100.0000 mL | Freq: Once | INTRAMUSCULAR | Status: AC | PRN
  Administered 2013-06-02: 100 mL via INTRAVENOUS

## 2013-06-02 MED ORDER — METHOCARBAMOL 500 MG PO TABS: 500.0000 mg | ORAL_TABLET | Freq: Two times a day (BID) | ORAL | Status: DC | PRN

## 2013-06-02 MED ORDER — IOHEXOL 300 MG/ML  SOLN
50.0000 mL | Freq: Once | INTRAMUSCULAR | Status: AC | PRN
  Administered 2013-06-02: 50 mL via ORAL

## 2013-06-02 MED ORDER — SODIUM CHLORIDE 0.9 % IJ SOLN
INTRAMUSCULAR | Status: AC
  Administered 2013-06-02: 17:00:00
  Filled 2013-06-02: qty 400

## 2013-06-02 MED ORDER — ONDANSETRON HCL 4 MG/2ML IJ SOLN
4.0000 mg | INTRAMUSCULAR | Status: DC | PRN
  Administered 2013-06-02: 4 mg via INTRAVENOUS
  Filled 2013-06-02: qty 2

## 2013-06-02 MED ORDER — MORPHINE SULFATE 2 MG/ML IJ SOLN
2.0000 mg | Freq: Once | INTRAMUSCULAR | Status: AC
  Administered 2013-06-02: 2 mg via INTRAVENOUS
  Filled 2013-06-02: qty 1

## 2013-06-02 MED ORDER — IPRATROPIUM-ALBUTEROL 0.5-2.5 (3) MG/3ML IN SOLN
3.0000 mL | Freq: Once | RESPIRATORY_TRACT | Status: AC
  Administered 2013-06-02: 3 mL via RESPIRATORY_TRACT
  Filled 2013-06-02: qty 3

## 2013-06-02 MED ORDER — ALBUTEROL SULFATE (2.5 MG/3ML) 0.083% IN NEBU
2.5000 mg | INHALATION_SOLUTION | Freq: Once | RESPIRATORY_TRACT | Status: AC
  Administered 2013-06-02: 2.5 mg via RESPIRATORY_TRACT
  Filled 2013-06-02: qty 3

## 2013-06-02 NOTE — Discharge Instructions (Signed)
°Emergency Department Resource Guide °1) Find a Doctor and Pay Out of Pocket °Although you won't have to find out who is covered by your insurance plan, it is a good idea to ask around and get recommendations. You will then need to call the office and see if the doctor you have chosen will accept you as a new patient and what types of options they offer for patients who are self-pay. Some doctors offer discounts or will set up payment plans for their patients who do not have insurance, but you will need to ask so you aren't surprised when you get to your appointment. ° °2) Contact Your Local Health Department °Not all health departments have doctors that can see patients for sick visits, but many do, so it is worth a call to see if yours does. If you don't know where your local health department is, you can check in your phone book. The CDC also has a tool to help you locate your state's health department, and many state websites also have listings of all of their local health departments. ° °3) Find a Walk-in Clinic °If your illness is not likely to be very severe or complicated, you may want to try a walk in clinic. These are popping up all over the country in pharmacies, drugstores, and shopping centers. They're usually staffed by nurse practitioners or physician assistants that have been trained to treat common illnesses and complaints. They're usually fairly quick and inexpensive. However, if you have serious medical issues or chronic medical problems, these are probably not your best option. ° °No Primary Care Doctor: °- Call [facility] at  832-8000 - they can help you locate a primary care doctor that  accepts your insurance, provides certain services, etc. °- Physician Referral Service- 1-800-533-3463 ° °Chronic Pain Problems: °Organization         Address  Phone   Notes  °[facility]  ([phone]) [phone] Patients need to be referred by their primary care doctor.  ° °Medication  Assistance: °Organization         Address  Phone   Notes  °[place] [facility] [address] °[place], [place] [address] (336) 641-8030 --Must be a resident of [place] °-- Must have NO insurance coverage whatsoever (no Medicaid/ Medicare, etc.) °-- The pt. MUST have a primary care doctor that directs their care regularly and follows them in the community °  °[facility]  (866) 331-1348   °[employer]  (888) 892-1162   ° °Agencies that provide inexpensive medical care: °Organization         Address  Phone   Notes  °[facility]  (336) 832-8035   °[facility]    (336) 832-7272   °[facility] [address] °[place], [place] [address] (336) 832-4777   °[facility] [address], °[place] (336) 271-4999   °[facility]    (336) 373-0678   °[facility]    (336) 272-1050   °[facility] ° [address], [place] Phone:  (336) 832-4444, Fax:  (336) 832-4440 Hours of Operation:  9 am - 6 pm, M-F.  Also accepts Medicaid/Medicare and self-pay.  °[facility] ° [address], [place] Phone: (336) [phone], Fax: (336) [phone]. Hours of Operation:  8:30 am - 5:30 pm, M-F.  Also accepts Medicaid and self-pay.  °[facility] [place] [address], [place] Phone: (336) 878-6027   °[facility] [address], [place], [place] (336)723-1848, Ext. 123 Mondays & Thursdays: 7-9 AM.  First 15 patients are seen on a first come, first serve basis. °  ° °Medicaid-accepting [place] Providers: ° °Organization         Address  Phone   Notes  °[facility] [address], [place] (336) 641-2100 Also accepts self-pay patients.  °[facility] [address], [place] ° (336) 856-9996   °[facility] [address], [place]  (336) 288-8857   °[facility] [address] [place] [address], [place] (336) 299-7000   °Veita Bland [address], [place]  ° (336) [phone] Only accepts [place] Access Medicaid patients after they have their name applied to their card.  ° °Self-Pay (no insurance) in [place]: ° °Organization         Address  Phone   Notes  °Sickle Cell Patients, [facility] [address], [place] (336) 832-1970   °[facility] [address], [place] (336) 832-4400   °[facility] ° [address], [place] (336) 992-4800   °[facility]/Dr. Osei-Bonsu ° [address] [place] [address], [place] or [address], [place] (336) [phone] Phone number for both [place] and [place] locations is the same.  °[facility] [address], [place] (336) 299-0000   °[facility] [address] [place] [address], [place] or [address] (336) 852-7530 °(336) 878-2260   °[facility] [address], [place] (336) 350-1642, phone; (336) 294-5005, fax Sees patients 1st and 3rd Saturday of every month.  Must not qualify for public or private insurance (i.e. Medicaid, Medicare, [place] Health Choice, Veterans' Benefits) • Household income should be no more than 200% of the poverty level •The clinic cannot treat you if you are pregnant or think you are pregnant • Sexually transmitted diseases are not treated at the clinic.  ° ° °Dental Care: °Organization         Address  Phone  Notes  °[place] [facility] [address],  (336) 641-6152 Accepts children up to age 21 who are enrolled in Medicaid or [place] Health Choice; pregnant women with a Medicaid card; and children who have applied for Medicaid or [place] Health Choice, but were declined, whose parents can pay a reduced fee at time of service.  °[place]  [facility] [place]  [address], [place] (336) 641-7733 Accepts children up to age 21 who are enrolled in Medicaid or [place] Health Choice; pregnant women with a Medicaid card; and children who have applied for Medicaid or [place] Health Choice, but were declined, whose parents can pay a reduced fee at time of service.  °[facility] ° [address],  (336) 641-4533 Patients are seen by appointment only. Walk-ins are not accepted. [facility] will see patients 18 years of age and older. °Monday - Tuesday (8am-5pm) °Most Wednesdays (8:30-5pm) °$30 per visit, cash only  °[facility] ° [address], [place] (336) [phone] Patients are seen by appointment only. Walk-ins are not accepted. [facility] will see patients 18 years of age and older. °One   Wednesday Evening (Monthly: Volunteer Based).  $30 per visit, cash only  °[facility]  (919) 537-3737 for adults; Children under age 4, call Graduate Pediatric Dentistry at ([phone]) [phone]. Children aged 4-14, please call (919) [phone] to request a pediatric application. ° Dental services are provided in all areas of dental care including fillings, crowns and bridges, complete and partial dentures, implants, gum treatment, root canals, and extractions. Preventive care is also provided. Treatment is provided to both adults and children. °Patients are selected via a lottery and there is often a waiting list. °  °[facility] [address], °[place] ° (336) 763-8833 [email or web address] °  °[facility] [address], [place], [place] (336)723-1848, Ext. 123 Second and Fourth Thursday of each month, opens at 6:30 AM; Clinic ends at 9 AM.  Patients are seen on a first-come first-served basis, and a limited number are seen during each clinic.  ° °[facility] ° [address], [place], [place] (336) [phone]    Eligibility Requirements °You must have lived in [place], [place], or [place] counties for at least the last three months. °  You cannot be eligible for state or federal sponsored healthcare insurance, including Veterans Administration, Medicaid, or Medicare. °  You generally cannot be eligible for healthcare insurance through your employer.  °  How to apply: °Eligibility screenings are held every Tuesday and Wednesday afternoon from 1:00 pm until 4:00 pm. You do not need an appointment for the interview!  °[facility] [address], [place], [place] 336-631-2330   °[facility] [facility]  336-342-8273   °[facility]  336-703-3100   °[facility]  336-570-6415   ° °Behavioral Health Resources in the Community: °Intensive Outpatient Programs °Organization         Address  Phone  Notes  °[facility] [address], [place], [place] 336-878-6098   °[facility] [address], [place], [place] 336-832-9800   °[facility] [address], [place], [place] ° 336-882-2125   °[facility] [address],  °[place], [place] 1-800-853-5163 or 336-641-4981   °Substance Abuse Resources °Organization         Address  Phone  Notes  °[facility]  336-882-2125   °[facility]  336-784-9470   °[facility]  336-285-9073   °[facility]  336-845-3988   °Residential & Outpatient Substance Abuse Program  1-800-659-3381   °Psychological Services °Organization         Address  Phone  Notes  °[facility]  336- 832-9600   °[facility]  336- 378-7881   °[facility] [address], [place] 1-800-853-5163 or 336-641-4981   ° °Mobile Crisis Teams °Organization         Address  Phone  Notes  °[facility], Mobile Crisis Care Unit  1-877-626-1772   °Assertive °[facility] ° [address]  [place], [place] 336-834-9664   °Sharon DeEsch [address] °[place] 336-554-5454   ° °Self-Help/Support Groups °Organization         Address  Phone             Notes  °[facility]  - variety of support groups  336- 373-1402 Call for more information  °Narcotics Anonymous (NA), [facility] [address], °[place]  2 meetings at this location  ° °  Residential Treatment Programs °Organization         Address  Phone  Notes  °[facility] [address],    °[place]  1-866-801-8205   °[facility] ° [address], [place], [place] 704-293-8524   °[facility] [address], [place] 336-845-3988 Admissions: 8am-3pm M-F  °[facility] [address]    °[place], [place] 336-841-1104   °[facility] [address], [place], [place] 336-379-7146   °[facility] [address]  °[place], [place] 336-285-9073   °[facility] Intensive Outpatient [address], [place], [place] 336-852-3033   °[facility] [address]  °[place], [place] 1-877-615-2722 or 336-784-9470   °[facility] [address] [place], [place] 336-227-7417 Accepts Medicaid  °[facility] [address]  °[place] 1-800-659-3381 Substance Abuse/Addiction Treatment  ° °[place] Behavioral Health Resources °Organization         Address  Phone  Notes  °[facility]  (888) 581-9988   °Julie Brannon, PhD [address] [place], [place]   (336) 349-5553 or (336) 951-0000   °[facility]   [address] °[place], [place] (336) 349-4454   °[facility] [address], [place], [place] (336) 342-8316 Insurance/Medicaid/sponsorship through [facility]  °[facility] [address]                                    [place], [place] (336) 342-8316 Therapy/tele-psych/case    °[facility] [address]  ° [place], [place] (336) 349-2233    °Dr. Arfeen  (336) 349-4544   °[facility] [place]  [employer] [place] [facility] 1) [address], [place] °2) [address], [place] °3)  [address], [place] (336) 349-3220 °(336) 342-7768 ° °(336) 342-8140   °[place] [facility] (336) 342-1394 or (336) 342-3537 (After Hours)    ° ° °Take the prescription as directed.  Apply moist heat or ice to the area(s) of discomfort, for 15 minutes at a time, several times per day for the next few days.  Do not fall asleep on a heating or ice pack.  Call your regular medical doctor tomorrow to schedule a follow up appointment in the next 2 days.  Return to the Emergency Department immediately if worsening. ° °

## 2013-06-02 NOTE — ED Provider Notes (Signed)
CSN: [id number]     Arrival date & time 06/02/13  1154 History   First MD Initiated Contact with Patient 06/02/13 1327     Chief Complaint  Patient presents with  . Flank Pain      HPI Pt was seen at 1335. Per pt, c/o gradual onset and worsening of persistent right flank "pain" for the past 2 to 3 days. Describes the pain as "sore," with radiation into her right upper abd. Further describes the pain as "like the way my other arthritis feels." Has been associated with nausea. Denies vomiting/diarrhea, no abd pain, no fevers, no rash, no CP/palpitations, no SOB/cough.     Past Medical History  Diagnosis Date  . COPD (chronic obstructive pulmonary disease)   . Hyperlipidemia   . Essential hypertension, benign   . Anemia   . Anxiety   . Arthritis   . Coronary atherosclerosis of native coronary artery     PTCA/BMS LAD 2000, PTCA/brachytherapy LAD 2001, LVEF 55%  . Type 2 diabetes mellitus   . Osteoporosis   . Venous insufficiency   . Obesity   . Myocardial infarction     AMI 2000  . Asthma   . Sleep apnea   . On home O2     prn  . DDD (degenerative disc disease), lumbar    Past Surgical History  Procedure Laterality Date  . Total abdominal hysterectomy    . Cholecystectomy    . Rotator cuff repair Right   . Cataract extraction w/phaco Left 06/30/2012    Procedure: CATARACT EXTRACTION PHACO AND INTRAOCULAR LENS PLACEMENT (IOC);  Surgeon: Gemma PayorKerry Hunt, MD;  Location: [facility];  Service: Ophthalmology;  Laterality: Left;  CDE:12.32   Family History  Problem Relation Age of Onset  . Coronary artery disease Other    History  Substance Use Topics  . Smoking status: Former Smoker -- 1.00 packs/day for 30 years    Types: Cigarettes    Quit date: 03/05/1998  . Smokeless tobacco: Not on file  . Alcohol Use: No    Review of Systems ROS: Statement: All systems negative except as marked or noted in the HPI; Constitutional: Negative for fever and chills. ; ; Eyes: Negative for eye  pain, redness and discharge. ; ; ENMT: Negative for ear pain, hoarseness, nasal congestion, sinus pressure and sore throat. ; ; Cardiovascular: Negative for chest pain, palpitations, diaphoresis, dyspnea and peripheral edema. ; ; Respiratory: Negative for cough, wheezing and stridor. ; ; Gastrointestinal: +nausea. Negative for vomiting, diarrhea, abdominal pain, blood in stool, hematemesis, jaundice and rectal bleeding. . ; ; Genitourinary: +flank pain. Negative for dysuria, and hematuria. ; ; Musculoskeletal: Negative for back pain and neck pain. Negative for swelling and trauma.; ; Skin: Negative for pruritus, rash, abrasions, blisters, bruising and skin lesion.; ; Neuro: Negative for headache, lightheadedness and neck stiffness. Negative for weakness, altered level of consciousness , altered mental status, extremity weakness, paresthesias, involuntary movement, seizure and syncope.      Allergies  Codeine and Terbutaline sulfate  Home Medications   Current Outpatient Rx  Name  Route  Sig  Dispense  Refill  . acetaminophen (TYLENOL) 500 MG tablet   Oral   Take 500 mg by mouth every 6 (six) hours as needed. pain         . albuterol (PROVENTIL HFA;VENTOLIN HFA) 108 (90 BASE) MCG/ACT inhaler   Inhalation   Inhale 2 puffs into the lungs every 6 (six) hours as needed for wheezing or shortness of  breath.   1 Inhaler   6   . albuterol (PROVENTIL) (2.5 MG/3ML) 0.083% nebulizer solution   Nebulization   Take 3 mLs (2.5 mg total) by nebulization 4 (four) times daily.   75 mL   11   . ALPRAZolam (XANAX) 0.5 MG tablet      TAKE ONE -HALF TO ONE TABLET BY MOUTH THREE TIMES DAILY AS NEEDED   90 tablet   5   . aspirin 81 MG tablet   Oral   Take 81 mg by mouth daily.         Marland Kitchen atenolol (TENORMIN) 50 MG tablet   Oral   Take 1 tablet (50 mg total) by mouth daily.   30 tablet   11   . budesonide (PULMICORT) 0.25 MG/2ML nebulizer solution   Nebulization   Take 2 mLs (0.25 mg total) by  nebulization 2 (two) times daily.   60 mL   11   . calcium-vitamin D (OSCAL WITH D) 500-200 MG-UNIT per tablet   Oral   Take 1 tablet by mouth 2 (two) times daily.           . Cholecalciferol (VITAMIN D3) 1000 UNITS CAPS   Oral   Take 1 capsule by mouth daily.          Marland Kitchen dextromethorphan-guaiFENesin (MUCINEX DM) 30-600 MG per 12 hr tablet   Oral   Take 2 tablets by mouth every 12 (twelve) hours.           Marland Kitchen etodolac (LODINE) 400 MG tablet      TAKE ONE TABLET BY MOUTH EVERY 12 HOURS   60 tablet   3   . ferrous sulfate 325 (65 FE) MG tablet   Oral   Take 325 mg by mouth daily with breakfast.          . fluticasone (FLONASE) 50 MCG/ACT nasal spray      USE TWO SPRAY IN EACH NOSTRIL ONCE DAILY   16 g   6   . furosemide (LASIX) 40 MG tablet      TAKE ONE TABLET BY MOUTH ONCE DAILY   30 tablet   5   . gabapentin (NEURONTIN) 300 MG capsule   Oral   Take 1 capsule (300 mg total) by mouth 3 (three) times daily.   90 capsule   0   . ibandronate (BONIVA) 150 MG tablet      TAKE ONE TABLET BY MOUTH ONCE A MONTH   1 tablet   6   . insulin NPH-regular (NOVOLIN 70/30) (70-30) 100 UNIT/ML injection      INJECT 35 UNITS IN THE MORNING AND 35  UNITS IN THE EVENING.         Marland Kitchen ipratropium (ATROVENT) 0.02 % nebulizer solution   Nebulization   Take 2.5 mLs (500 mcg total) by nebulization 4 (four) times daily.   75 mL   11   . isosorbide mononitrate (IMDUR) 30 MG 24 hr tablet   Oral   Take 1 tablet (30 mg total) by mouth daily.   90 tablet   1   . losartan (COZAAR) 50 MG tablet      TAKE ONE TABLET BY MOUTH ONCE DAILY   30 tablet   6   . metFORMIN (GLUCOPHAGE) 500 MG tablet      TAKE ONE TABLET BY MOUTH TWICE DAILY   60 tablet   1   . nitroGLYCERIN (NITROSTAT) 0.4 MG SL tablet   Sublingual   Place  1 tablet (0.4 mg total) under the tongue every 5 (five) minutes as needed. May repeat x3   25 tablet   6   . omeprazole (PRILOSEC) 20 MG capsule       TAKE ONE CAPSULE BY MOUTH ONCE DAILY 30  MINUTES  BEFORE  A  MEAL   30 capsule   5   . Probiotic Product (PROBIOTIC DAILY PO)   Oral   Take 1 tablet by mouth daily.          Marland Kitchen PROLENSA 0.07 % SOLN   Both Eyes   Place 1 drop into both eyes 4 (four) times daily.         . simvastatin (ZOCOR) 80 MG tablet      TAKE ONE TABLET BY MOUTH EVERY DAY AT BEDTIME   30 tablet   6   . traMADol (ULTRAM) 50 MG tablet      TAKE ONE TABLET BY MOUTH EVERY 8 HOURS AS NEEDED FOR PAIN   90 tablet   5   . vitamin B-12 (CYANOCOBALAMIN) 500 MCG tablet   Oral   Take 1,000 mcg by mouth daily.          . vitamin C (ASCORBIC ACID) 500 MG tablet   Oral   Take 500 mg by mouth daily.         Marland Kitchen glucose blood (ONE TOUCH ULTRA TEST) test strip      TEST BLOOD SUGAR LEVELS THREE TIMES DAILY   100 each   5     DX:  250.00   . ONE TOUCH LANCETS MISC      Check blood sugars two times daily   100 each   6     Medicare part B.  DX:  250.00    BP 114/66  Pulse 68  Temp(Src) 98.1 F (36.7 C) (Oral)  Resp 20  Ht 5' 4.5" (1.638 m)  Wt 204 lb (92.534 kg)  BMI 34.49 kg/m2  SpO2 92% Physical Exam 1340: Physical examination:  Nursing notes reviewed; Vital signs and O2 SAT reviewed;  Constitutional: Well developed, Well nourished, Well hydrated, Uncomfortable appearing.; Head:  Normocephalic, atraumatic; Eyes: EOMI, PERRL, No scleral icterus; ENMT: Mouth and pharynx normal, Mucous membranes moist; Neck: Supple, Full range of motion, No lymphadenopathy; Cardiovascular: Regular rate and rhythm, No gallop; Respiratory: Breath sounds diminished & equal bilaterally, faint scattered wheezes. Speaking full sentences, no retrax or access mm use. Normal respiratory effort/excursion; Chest: Nontender, Movement normal; Abdomen: Soft, +mild diffuse tenderness to palp. +palp soft NT ventral hernia without overlying erythema or ecchymosis. Nondistended, Normal bowel sounds; Genitourinary: No CVA tenderness;  Spine:  No midline CS, TS, LS tenderness. +TTP right lumbar paraspinal muscles. No rash.;; Extremities: Pulses normal, No tenderness, No edema, No calf edema or asymmetry.; Neuro: AA&Ox3, Major CN grossly intact.  Speech clear. No gross focal motor or sensory deficits in extremities. Climbs on and off stretcher easily by herself. Gait steady.; Skin: Color normal, Warm, Dry.   ED Course  Procedures     EKG Interpretation   Date/Time:  Tuesday June 02 2013 13:52:26 EDT Ventricular Rate:  65 PR Interval:  164 QRS Duration: 128 QT Interval:  444 QTC Calculation: 461 R Axis:   -77 Text Interpretation:  Normal sinus rhythm Left axis deviation Left  ventricular hypertrophy with QRS widening Cannot rule out Septal infarct  (cited on or before 14-Jul-1998) T wave abnormality Inferior leads T wave  abnormality Anterolateral leads Abnormal ECG When compared with ECG  of  26-Sep-2007 10:28, QRS duration has increased T wave inversion now evident  in Inferior leads Inverted T waves have replaced nonspecific T wave  abnormality in Lateral leads QT has lengthened Confirmed by [facility]  MD,  Nicholos Johns [phone]) on 06/02/2013 2:07:57 PM     EKG today unchanged from previous EKG dated 07/23/2012 and 12/20/2010.      MDM  MDM Reviewed: previous chart, nursing note and vitals Reviewed previous: ECG and labs Interpretation: labs, ECG, x-ray and CT scan     Results for orders placed during the hospital encounter of 06/02/13  CBC WITH DIFFERENTIAL      Result Value Ref Range   WBC 7.5  4.0 - 10.5 K/uL   RBC 4.58  3.87 - 5.11 MIL/uL   Hemoglobin 13.9  12.0 - 15.0 g/dL   HCT 60.4  54.0 - 98.1 %   MCV 91.0  78.0 - 100.0 fL   MCH 30.3  26.0 - 34.0 pg   MCHC 33.3  30.0 - 36.0 g/dL   RDW 19.1  47.8 - 29.5 %   Platelets 242  150 - 400 K/uL   Neutrophils Relative % 75  43 - 77 %   Neutro Abs 5.5  1.7 - 7.7 K/uL   Lymphocytes Relative 15  12 - 46 %   Lymphs Abs 1.2  0.7 - 4.0 K/uL   Monocytes  Relative 9  3 - 12 %   Monocytes Absolute 0.7  0.1 - 1.0 K/uL   Eosinophils Relative 1  0 - 5 %   Eosinophils Absolute 0.1  0.0 - 0.7 K/uL   Basophils Relative 0  0 - 1 %   Basophils Absolute 0.0  0.0 - 0.1 K/uL  COMPREHENSIVE METABOLIC PANEL      Result Value Ref Range   Sodium 137  137 - 147 mEq/L   Potassium 4.0  3.7 - 5.3 mEq/L   Chloride 94 (*) 96 - 112 mEq/L   CO2 30  19 - 32 mEq/L   Glucose, Bld 208 (*) 70 - 99 mg/dL   BUN 13  6 - 23 mg/dL   Creatinine, Ser 6.21  0.50 - 1.10 mg/dL   Calcium 9.6  8.4 - 30.8 mg/dL   Total Protein 7.0  6.0 - 8.3 g/dL   Albumin 3.9  3.5 - 5.2 g/dL   AST 15  0 - 37 U/L   ALT 17  0 - 35 U/L   Alkaline Phosphatase 55  39 - 117 U/L   Total Bilirubin 0.4  0.3 - 1.2 mg/dL   GFR calc non Af Amer 52 (*) >90 mL/min   GFR calc Af Amer 60 (*) >90 mL/min  URINALYSIS, ROUTINE W REFLEX MICROSCOPIC      Result Value Ref Range   Color, Urine YELLOW  YELLOW   APPearance CLEAR  CLEAR   Specific Gravity, Urine 1.015  1.005 - 1.030   pH 6.0  5.0 - 8.0   Glucose, UA NEGATIVE  NEGATIVE mg/dL   Hgb urine dipstick TRACE (*) NEGATIVE   Bilirubin Urine NEGATIVE  NEGATIVE   Ketones, ur NEGATIVE  NEGATIVE mg/dL   Protein, ur NEGATIVE  NEGATIVE mg/dL   Urobilinogen, UA 0.2  0.0 - 1.0 mg/dL   Nitrite NEGATIVE  NEGATIVE   Leukocytes, UA SMALL (*) NEGATIVE  LIPASE, BLOOD      Result Value Ref Range   Lipase 21  11 - 59 U/L  TROPONIN I      Result Value  Ref Range   Troponin I <0.30  <0.30 ng/mL  URINE MICROSCOPIC-ADD ON      Result Value Ref Range   Squamous Epithelial / LPF RARE  RARE   WBC, UA 0-2  <3 WBC/hpf   RBC / HPF 0-2  <3 RBC/hpf  TROPONIN I      Result Value Ref Range   Troponin I <0.30  <0.30 ng/mL   Dg Chest 2 View 06/02/2013   CLINICAL DATA:  Flank pain  EXAM: CHEST  2 VIEW  COMPARISON:  CT ABD - PELV W/ CM dated 06/02/2013; DG CHEST 2 VIEW dated 07/04/2012  FINDINGS: Cardiac silhouette is enlarged. Discoid atelectasis within the lingula and left  lower lobe. No focal infiltrate small focal regions of consolidation. Degenerative changes within the thoracic spine and shoulders no evidence of acute osseous abnormalities.  IMPRESSION: No active cardiopulmonary disease.   Electronically Signed   By: Salome Holmes M.D.   On: 06/02/2013 16:35   Ct Abdomen Pelvis W Contrast 06/02/2013   CLINICAL DATA:  Left flank pain for 3 days  EXAM: CT ABDOMEN AND PELVIS WITH CONTRAST  TECHNIQUE: Multidetector CT imaging of the abdomen and pelvis was performed using the standard protocol following bolus administration of intravenous contrast.  CONTRAST:  50mL OMNIPAQUE IOHEXOL 300 MG/ML SOLN, OMNIPAQUE IOHEXOL 300 MG/ML SOLN  COMPARISON:  None.  FINDINGS: Lung bases are clear.  No pericardial fluid.  No focal hepatic lesion. Post cholecystectomy. The pancreas, spleen, and adrenal glands are normal. There is 2 nonobstructing calculus within the left kidney. There is apparent perinephric stranding the left and right which appears senescent. No ureterolithiasis or obstructive uropathy.  Stomach, small bowel, small bowel and cecum normal. The colon demonstrates a long segment of nondistention extending from the cecum to the hepatic flexure. There is stool in the more distal colon. The leftcolon is relatively featureless additionally. Stool in the rectum.  Abdominal aorta is normal caliber. No retroperitoneal periportal lymphadenopathy.  There is a large ventral abdominal hernia which contains only fat but measures 9 cm (image 20). Post hysterectomy anatomy. The bladder is normal. No pelvic lymphadenopathy. No inguinal hernia. No aggressive osseous lesion.  IMPRESSION: 1. No clear acute abdominal or pelvic findings. 2. Perinephric stranding is likely senescent stranding. 3. Left nephrolithiasis without evidence of obstructive uropathy. 4. Nondistention of a long segment of ascending colon. This is likely physiologic nondistention; however, if the patient is not current for  colonoscopy screening exam, consider such for evaluation of the right colon.   Electronically Signed   By: Genevive Bi M.D.   On: 06/02/2013 16:42    1745:  Pt is sitting on the side of her stretcher, stating she feels better after pain meds and wants to go home now. Pt has tol PO well while in the ED without N/V.  No stooling while in the ED.  Abd benign, VSS.  Has been ambulatory around the ED exam room and hallways with steady gait, easy resps, NAD. Doubt PE as cause for symptoms with low risk [place].  Doubt ACS as cause for symptoms with normal troponin x2 after 2 to 3 days of constant atypical symptoms (right sided low back pain, no CP/SOB). CT scan reassuring. Will tx symptomatically at this time. Dx and testing d/w pt and family.  Questions answered.  Verb understanding, agreeable to d/c home with outpt f/u.   Laray Anger, DO 06/05/13 1356

## 2013-06-02 NOTE — Telephone Encounter (Signed)
Called and spoke with pt. She reports she is having a lot of pain in her back and pain. She is concerned about this. She does not have a way here. [facility]. Nothing further needed

## 2013-06-02 NOTE — ED Notes (Signed)
Pt c/o r sided abd pain and flank pain since Sunday.  Reports frequent urination also.

## 2013-06-02 NOTE — ED Notes (Signed)
Pain lt flank , nausea,Frequency of  Urination.  Said she had chills this am.  Pt is on home 02.

## 2013-06-03 DIAGNOSIS — B029 Zoster without complications: Secondary | ICD-10-CM

## 2013-06-03 HISTORY — DX: Zoster without complications: B02.9

## 2013-06-03 HISTORY — PX: TRANSTHORACIC ECHOCARDIOGRAM: SHX275

## 2013-06-04 ENCOUNTER — Telehealth: Payer: Self-pay | Admitting: Pulmonary Disease

## 2013-06-04 ENCOUNTER — Encounter: Payer: Self-pay | Admitting: Family Medicine

## 2013-06-04 ENCOUNTER — Ambulatory Visit (INDEPENDENT_AMBULATORY_CARE_PROVIDER_SITE_OTHER): Payer: Medicare Other | Admitting: Family Medicine

## 2013-06-04 VITALS — BP 165/77 | HR 63 | Temp 97.6°F | Resp 20 | Ht 64.5 in | Wt 204.0 lb

## 2013-06-04 DIAGNOSIS — B029 Zoster without complications: Secondary | ICD-10-CM

## 2013-06-04 DIAGNOSIS — I2510 Atherosclerotic heart disease of native coronary artery without angina pectoris: Secondary | ICD-10-CM

## 2013-06-04 MED ORDER — VALACYCLOVIR HCL 1 G PO TABS: 1000.0000 mg | ORAL_TABLET | Freq: Three times a day (TID) | ORAL | Status: DC

## 2013-06-04 MED ORDER — HYDROCODONE-ACETAMINOPHEN 5-325 MG PO TABS: ORAL_TABLET | ORAL | Status: DC

## 2013-06-04 MED ORDER — PREDNISONE 20 MG PO TABS: ORAL_TABLET | ORAL | Status: DC

## 2013-06-04 NOTE — Telephone Encounter (Signed)
Spoke with pt's daughter. Pt was taken to the ED on Tuesday for lower right back pain. ED MD thought that pt was getting the starts of Shingles. Was given muscle relaxer's to help with pain. She would like to be seen by someone. New PCP needs to be established as well.  I called [facility] and they were able to get pt in today to be seen by Dr. Milinda CaveMcGowen. Pt's daughter is aware of location, date and time. Nothing further was needed.

## 2013-06-04 NOTE — Progress Notes (Signed)
Pre visit review using our clinic review tool, if applicable. No additional management support is needed unless otherwise documented below in the visit note. 

## 2013-06-04 NOTE — Progress Notes (Signed)
OFFICE NOTE  06/04/2013  CC:  Chief Complaint  Patient presents with  . Rash     HPI: Patient is a 73 y.o. Caucasian female who is here for rash. Onset of pain in right low back/side/abd about 4 nights ago--burning/severe, then broke out in rash last night. She is transferring care from Dr. Kriste Basque to me.  Pertinent PMH:  Has never had shingles before.  MEDS:  Outpatient Prescriptions Prior to Visit  Medication Sig Dispense Refill  . acetaminophen (TYLENOL) 500 MG tablet Take 500 mg by mouth every 6 (six) hours as needed. pain      . albuterol (PROVENTIL HFA;VENTOLIN HFA) 108 (90 BASE) MCG/ACT inhaler Inhale 2 puffs into the lungs every 6 (six) hours as needed for wheezing or shortness of breath.  1 Inhaler  6  . albuterol (PROVENTIL) (2.5 MG/3ML) 0.083% nebulizer solution Take 3 mLs (2.5 mg total) by nebulization 4 (four) times daily.  75 mL  11  . ALPRAZolam (XANAX) 0.5 MG tablet TAKE ONE-HALF TO ONE  TABLET BY MOUTH THREE TIMES DAILY AS NEEDED  90 tablet  0  . aspirin 81 MG tablet Take 81 mg by mouth daily.      Marland Kitchen atenolol (TENORMIN) 50 MG tablet TAKE ONE TABLET BY MOUTH ONCE DAILY  30 tablet  3  . budesonide (PULMICORT) 0.25 MG/2ML nebulizer solution Take 2 mLs (0.25 mg total) by nebulization 2 (two) times daily.  60 mL  11  . calcium-vitamin D (OSCAL WITH D) 500-200 MG-UNIT per tablet Take 1 tablet by mouth 2 (two) times daily.        . Cholecalciferol (VITAMIN D3) 1000 UNITS CAPS Take 1 capsule by mouth daily.       Marland Kitchen dextromethorphan-guaiFENesin (MUCINEX DM) 30-600 MG per 12 hr tablet Take 2 tablets by mouth every 12 (twelve) hours.        Marland Kitchen etodolac (LODINE) 400 MG tablet TAKE ONE TABLET BY MOUTH EVERY 12 HOURS  60 tablet  3  . ferrous sulfate 325 (65 FE) MG tablet Take 325 mg by mouth daily with breakfast.       . fluticasone (FLONASE) 50 MCG/ACT nasal spray USE TWO SPRAY IN EACH NOSTRIL ONCE DAILY  16 g  6  . furosemide (LASIX) 40 MG tablet TAKE ONE TABLET BY MOUTH ONCE DAILY   30 tablet  5  . gabapentin (NEURONTIN) 300 MG capsule Take 1 capsule (300 mg total) by mouth 3 (three) times daily.  90 capsule  0  . ibandronate (BONIVA) 150 MG tablet TAKE ONE TABLET BY MOUTH ONCE A MONTH  1 tablet  6  . insulin NPH-regular (NOVOLIN 70/30) (70-30) 100 UNIT/ML injection INJECT 35 UNITS IN THE MORNING AND 35  UNITS IN THE EVENING.      . isosorbide mononitrate (IMDUR) 30 MG 24 hr tablet Take 1 tablet (30 mg total) by mouth daily.  90 tablet  1  . losartan (COZAAR) 50 MG tablet TAKE ONE TABLET BY MOUTH ONCE DAILY  30 tablet  6  . metFORMIN (GLUCOPHAGE) 500 MG tablet TAKE ONE TABLET BY MOUTH TWICE DAILY  60 tablet  1  . methocarbamol (ROBAXIN) 500 MG tablet Take 1 tablet (500 mg total) by mouth 2 (two) times daily as needed for muscle spasms.  10 tablet  0  . nitroGLYCERIN (NITROSTAT) 0.4 MG SL tablet Place 1 tablet (0.4 mg total) under the tongue every 5 (five) minutes as needed. May repeat x3  25 tablet  6  . omeprazole (  PRILOSEC) 20 MG capsule TAKE ONE CAPSULE BY MOUTH ONCE DAILY 30  MINUTES  BEFORE  A  MEAL  30 capsule  5  . ONE TOUCH LANCETS MISC Check blood sugars two times daily  100 each  6  . Probiotic Product (PROBIOTIC DAILY PO) Take 1 tablet by mouth daily.       Marland Kitchen. PROLENSA 0.07 % SOLN Place 1 drop into both eyes 4 (four) times daily.      . simvastatin (ZOCOR) 80 MG tablet TAKE ONE TABLET BY MOUTH EVERY DAY AT BEDTIME  30 tablet  6  . traMADol (ULTRAM) 50 MG tablet TAKE ONE TABLET BY MOUTH EVERY 8 HOURS AS NEEDED FOR PAIN  90 tablet  5  . vitamin B-12 (CYANOCOBALAMIN) 500 MCG tablet Take 1,000 mcg by mouth daily.       . vitamin C (ASCORBIC ACID) 500 MG tablet Take 500 mg by mouth daily.      Marland Kitchen. glucose blood (ONE TOUCH ULTRA TEST) test strip TEST BLOOD SUGAR LEVELS THREE TIMES DAILY  100 each  5  . ipratropium (ATROVENT) 0.02 % nebulizer solution Take 2.5 mLs (500 mcg total) by nebulization 4 (four) times daily.  75 mL  11   No facility-administered medications prior  to visit.    PE: Blood pressure 165/77, pulse 63, temperature 97.6 F (36.4 C), temperature source Temporal, resp. rate 20, height 5' 4.5" (1.638 m), weight 204 lb (92.534 kg), SpO2 98.00%. Gen: Alert, well appearing.  Patient is oriented to person, place, time, and situation. Skin: right side of mid thorax with mildly erythematous rash consisting of very fine vesicles grouped in patches--in dermatomal distribution.  No maceration, pustules, nodules, or streaking.  IMPRESSION AND PLAN:  Herpes zoster Pred 40mg  qd x 3d, then 20mg  qd x 3d, then 10mg  qd x 2d. Valtrex 1 g tid x 7d. Vicodin 5/325, 1-2 tabs po q6h prn, #30, no RF.   An After Visit Summary was printed and given to the patient.  FOLLOW UP: 2 wks for 30 min establish care appt

## 2013-06-09 DIAGNOSIS — B029 Zoster without complications: Secondary | ICD-10-CM | POA: Insufficient documentation

## 2013-06-09 NOTE — Assessment & Plan Note (Signed)
Pred 40mg  qd x 3d, then 20mg  qd x 3d, then 10mg  qd x 2d. Valtrex 1 g tid x 7d. Vicodin 5/325, 1-2 tabs po q6h prn, #30, no RF.

## 2013-06-11 ENCOUNTER — Telehealth: Payer: Self-pay | Admitting: Family Medicine

## 2013-06-11 ENCOUNTER — Other Ambulatory Visit: Payer: Self-pay | Admitting: Pulmonary Disease

## 2013-06-11 MED ORDER — HYDROCODONE-ACETAMINOPHEN 5-325 MG PO TABS: ORAL_TABLET | ORAL | Status: DC

## 2013-06-11 NOTE — Telephone Encounter (Signed)
Rx for #30 vicodin printed. If shingles not improving in another week or so then she should return to let me take a look at it again.--thx

## 2013-06-11 NOTE — Telephone Encounter (Signed)
Patient called stating that she is almost out of hydrocodone.  She states she still has shingles all over and would like to see if she could get a refill.  Patient last seen and Rx'ed hydrocodone on 06/04/13 # 30.  Please advise.

## 2013-06-11 NOTE — Telephone Encounter (Signed)
Patient aware of Rx ready for pick up.   I will let patient know when she picks up medication that if shingles isn't better in one week to return to office.

## 2013-06-12 NOTE — Telephone Encounter (Signed)
Patient aware that she needs OV if her shingles isn't improved in one week.

## 2013-06-18 ENCOUNTER — Encounter: Payer: Self-pay | Admitting: Family Medicine

## 2013-06-18 ENCOUNTER — Ambulatory Visit: Payer: Medicare Other | Admitting: Family Medicine

## 2013-06-18 VITALS — BP 149/77 | HR 73 | Temp 97.8°F | Resp 20 | Ht 64.5 in | Wt 204.0 lb

## 2013-06-18 DIAGNOSIS — E119 Type 2 diabetes mellitus without complications: Secondary | ICD-10-CM

## 2013-06-18 LAB — MICROALBUMIN / CREATININE URINE RATIO
CREATININE, U: 46.7 mg/dL
MICROALB UR: 0.8 mg/dL (ref 0.0–1.9)
MICROALB/CREAT RATIO: 1.7 mg/g (ref 0.0–30.0)

## 2013-06-18 LAB — HEMOGLOBIN A1C: Hgb A1c MFr Bld: 7.9 % — ABNORMAL HIGH (ref 4.6–6.5)

## 2013-06-18 MED ORDER — INSULIN NPH ISOPHANE & REGULAR (70-30) 100 UNIT/ML ~~LOC~~ SUSP: SUBCUTANEOUS | Status: DC

## 2013-06-18 NOTE — Progress Notes (Signed)
Pre visit review using our clinic review tool, if applicable. No additional management support is needed unless otherwise documented below in the visit note. 

## 2013-06-18 NOTE — Progress Notes (Signed)
Office Note 06/18/2013  CC:  Chief Complaint  Patient presents with   Establish Care    HPI:  [name] is a 73 y.o. white female who is here to establish care.  I saw her for the first time 9d/a for acute visit: dx was herpes zoster and I put her on valtrex, prednisone, and Vicodin. Patient's most recent primary MD: Dr. Kriste BasqueNadel. Old records in EPIC/HL were reviewed prior to or during today's visit.  Past Medical History  Diagnosis Date   COPD (chronic obstructive pulmonary disease)    Hyperlipidemia    Essential hypertension, benign    Anemia    Anxiety    Arthritis    Coronary atherosclerosis of native coronary artery     PTCA/BMS LAD 2000, PTCA/brachytherapy LAD 2001, LVEF 55%   Type 2 diabetes mellitus    Osteoporosis    Venous insufficiency    Obesity    Myocardial infarction     AMI 2000   Asthma    Sleep apnea    On home O2     prn   DDD (degenerative disc disease), lumbar     Past Surgical History  Procedure Laterality Date   Total abdominal hysterectomy     Cholecystectomy     Rotator cuff repair Right    Cataract extraction w/phaco Left 06/30/2012    Procedure: CATARACT EXTRACTION PHACO AND INTRAOCULAR LENS PLACEMENT (IOC);  Surgeon: Gemma PayorKerry Hunt, MD;  Location: [facility];  Service: Ophthalmology;  Laterality: Left;  CDE:12.32    Family History  Problem Relation Age of Onset   Coronary artery disease Other     History   Social History   Marital Status: Widowed    Spouse Name: N/A    Number of Children: N/A   Years of Education: N/A   Occupational History   Not on file.   Social History Main Topics   Smoking status: Former Smoker -- 1.00 packs/day for 30 years    Types: Cigarettes    Quit date: 03/05/1998   Smokeless tobacco: Not on file   Alcohol Use: No   Drug Use: No   Sexual Activity: Yes    Birth Control/ Protection: Surgical   Other Topics Concern   Not on file   Social History Narrative   Widow, lives alone x 17 yrs, in  [place].   One son lives near her, daughter Eunice BlaseDebbie lives in [place].   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   Tobacco 60 pack-yr hx, quit 2002 when she had an MI.   No alcohol.  No drugs.             Outpatient Encounter Prescriptions as of 06/18/2013  Medication Sig   acetaminophen (TYLENOL) 500 MG tablet Take 500 mg by mouth every 6 (six) hours as needed. pain   albuterol (PROVENTIL HFA;VENTOLIN HFA) 108 (90 BASE) MCG/ACT inhaler Inhale 2 puffs into the lungs every 6 (six) hours as needed for wheezing or shortness of breath.   albuterol (PROVENTIL) (2.5 MG/3ML) 0.083% nebulizer solution Take 3 mLs (2.5 mg total) by nebulization 4 (four) times daily.   ALPRAZolam (XANAX) 0.5 MG tablet TAKE ONE -HALF TO ONE TABLET BY MOUTH THREE TIMES DAILY AS NEEDED   aspirin 81 MG tablet Take 81 mg by mouth daily.   atenolol (TENORMIN) 50 MG tablet TAKE ONE TABLET BY MOUTH ONCE DAILY   budesonide (PULMICORT) 0.25 MG/2ML nebulizer solution Take 2 mLs (0.25 mg total)  by nebulization 2 (two) times daily.   calcium-vitamin D (OSCAL WITH D) 500-200 MG-UNIT per tablet Take 1 tablet by mouth 2 (two) times daily.     Cholecalciferol (VITAMIN D3) 1000 UNITS CAPS Take 1 capsule by mouth daily.    dextromethorphan-guaiFENesin (MUCINEX DM) 30-600 MG per 12 hr tablet Take 2 tablets by mouth every 12 (twelve) hours.     etodolac (LODINE) 400 MG tablet TAKE ONE TABLET BY MOUTH EVERY 12 HOURS   ferrous sulfate 325 (65 FE) MG tablet Take 325 mg by mouth daily with breakfast.    fluticasone (FLONASE) 50 MCG/ACT nasal spray USE TWO SPRAY IN EACH NOSTRIL ONCE DAILY   furosemide (LASIX) 40 MG tablet TAKE ONE TABLET BY MOUTH ONCE DAILY   gabapentin (NEURONTIN) 300 MG capsule Take 1 capsule (300 mg total) by mouth 3 (three) times daily.   glucose blood (ONE TOUCH ULTRA TEST) test strip TEST BLOOD SUGAR LEVELS THREE TIMES DAILY   HYDROcodone-acetaminophen  (NORCO/VICODIN) 5-325 MG per tablet 1-2 tabs po q6h prn pain   ibandronate (BONIVA) 150 MG tablet TAKE ONE TABLET BY MOUTH ONCE A MONTH   insulin NPH-regular (NOVOLIN 70/30) (70-30) 100 UNIT/ML injection INJECT 35 UNITS IN THE MORNING AND 35  UNITS IN THE EVENING.   ipratropium (ATROVENT) 0.02 % nebulizer solution Take 2.5 mLs (500 mcg total) by nebulization 4 (four) times daily.   isosorbide mononitrate (IMDUR) 30 MG 24 hr tablet Take 1 tablet (30 mg total) by mouth daily.   losartan (COZAAR) 50 MG tablet TAKE ONE TABLET BY MOUTH ONCE DAILY   metFORMIN (GLUCOPHAGE) 500 MG tablet TAKE ONE TABLET BY MOUTH TWICE DAILY   methocarbamol (ROBAXIN) 500 MG tablet Take 1 tablet (500 mg total) by mouth 2 (two) times daily as needed for muscle spasms.   nitroGLYCERIN (NITROSTAT) 0.4 MG SL tablet Place 1 tablet (0.4 mg total) under the tongue every 5 (five) minutes as needed. May repeat x3   omeprazole (PRILOSEC) 20 MG capsule TAKE ONE CAPSULE BY MOUTH ONCE DAILY 30  MINUTES  BEFORE  A  MEAL   ONE TOUCH LANCETS MISC Check blood sugars two times daily   Probiotic Product (PROBIOTIC DAILY PO) Take 1 tablet by mouth daily.    PROLENSA 0.07 % SOLN Place 1 drop into both eyes 4 (four) times daily.   simvastatin (ZOCOR) 80 MG tablet TAKE ONE TABLET BY MOUTH EVERY DAY AT BEDTIME   traMADol (ULTRAM) 50 MG tablet TAKE ONE TABLET BY MOUTH EVERY 8 HOURS AS NEEDED FOR PAIN   valACYclovir (VALTREX) 1000 MG tablet Take 1 tablet (1,000 mg total) by mouth 3 (three) times daily.   vitamin B-12 (CYANOCOBALAMIN) 500 MCG tablet Take 1,000 mcg by mouth daily.    vitamin C (ASCORBIC ACID) 500 MG tablet Take 500 mg by mouth daily.   [DISCONTINUED] predniSONE (DELTASONE) 20 MG tablet 2 tabs po qd x 3d, then 1 tab po qd x 3d, then 1/2 tab po qd x 2d, then stop    Allergies  Allergen Reactions   Codeine     vomiting   Terbutaline Sulfate     REACTION: INTOL to terbutaline    ROS  PE; Blood pressure 149/77, pulse 73,  temperature 97.8 F (36.6 C), temperature source Temporal, resp. rate 20, height 5' 4.5" (1.638 m), weight 204 lb (92.534 kg), SpO2 99.00%.  Pertinent labs:   ASSESSMENT AND PLAN:   No problem-specific assessment & plan notes found for this encounter.   No Follow-up on  file.

## 2013-06-18 NOTE — Patient Instructions (Signed)
Buy OTC allegra (generic) 60mg  tab, take one tab twice daily as needed for itching. STOP your metformin (I took it off your med list). Change your insulin dosing to 25 Units in morning and 25 Units in PM.  Follow up in 2 wks with your sugar numbers written down.

## 2013-06-19 ENCOUNTER — Other Ambulatory Visit: Payer: Self-pay | Admitting: Family Medicine

## 2013-06-19 MED ORDER — TRAMADOL HCL 50 MG PO TABS: ORAL_TABLET | ORAL | Status: DC

## 2013-06-29 ENCOUNTER — Other Ambulatory Visit: Payer: Self-pay | Admitting: Pulmonary Disease

## 2013-07-01 ENCOUNTER — Other Ambulatory Visit: Payer: Self-pay | Admitting: Pulmonary Disease

## 2013-07-01 ENCOUNTER — Telehealth: Payer: Self-pay | Admitting: Pulmonary Disease

## 2013-07-01 ENCOUNTER — Telehealth: Payer: Self-pay

## 2013-07-01 NOTE — Telephone Encounter (Signed)
Spoke with [place]. Gave verbal to change sig on insulin syringe/needle. Nothing further was needed.

## 2013-07-01 NOTE — Telephone Encounter (Signed)
Relevant patient education mailed to patient.  

## 2013-07-02 ENCOUNTER — Ambulatory Visit (INDEPENDENT_AMBULATORY_CARE_PROVIDER_SITE_OTHER): Payer: Medicare Other | Admitting: Nurse Practitioner

## 2013-07-02 ENCOUNTER — Encounter: Payer: Self-pay | Admitting: Nurse Practitioner

## 2013-07-02 ENCOUNTER — Other Ambulatory Visit: Payer: Self-pay | Admitting: *Deleted

## 2013-07-02 ENCOUNTER — Ambulatory Visit: Payer: Medicare Other | Admitting: Family Medicine

## 2013-07-02 VITALS — BP 120/70 | HR 60 | Temp 97.8°F | Ht 64.5 in | Wt 200.5 lb

## 2013-07-02 DIAGNOSIS — B029 Zoster without complications: Secondary | ICD-10-CM

## 2013-07-02 DIAGNOSIS — E119 Type 2 diabetes mellitus without complications: Secondary | ICD-10-CM

## 2013-07-02 DIAGNOSIS — I2510 Atherosclerotic heart disease of native coronary artery without angina pectoris: Secondary | ICD-10-CM

## 2013-07-02 MED ORDER — ISOSORBIDE MONONITRATE ER 30 MG PO TB24: 30.0000 mg | ORAL_TABLET | Freq: Every day | ORAL | Status: DC

## 2013-07-02 MED ORDER — HYDROCODONE-ACETAMINOPHEN 5-325 MG PO TABS: 1.0000 | ORAL_TABLET | Freq: Two times a day (BID) | ORAL | Status: DC

## 2013-07-02 NOTE — Assessment & Plan Note (Signed)
Continue on 30u sq qam & qpm. A1C in 3 mos F/u 3 mos.

## 2013-07-02 NOTE — Progress Notes (Signed)
Subjective:     [name] is an 73 y.o. female who presents for follow up of diabetes. Pt was started on 70/30 insulin 25u am & pm,  metformin was d/c'd 2 weeks ago. Pt is administering 30 u rather than 25 in am & pm. Home sugars: am range 113-249, pm range is 112-316. Pt just finished course of prednisone for zoster. She c/o internal pain at site of zoster rash.     The following portions of the patient's history were reviewed and updated as appropriate: allergies, current medications, past medical history, past social history, past surgical history and problem list.  Review of Systems Pertinent items are noted in HPI.    Objective:    BP 120/70  Pulse 60  Temp(Src) 97.8 F (36.6 C) (Temporal)  Ht 5' 4.5" (1.638 m)  Wt 200 lb 8 oz (90.946 kg)  BMI 33.90 kg/m2  SpO2 95% General appearance: alert, cooperative, appears stated age and no distress Head: Normocephalic, without obvious abnormality, atraumatic Eyes: conjunctivae/corneas clear. PERRL, EOM's intact. Fundi benign. Extremities: edema mild +1 bilat LE Skin: Skin color, texture, turgor normal. No rashes or lesions Receiving oxygen by [place]     Assessment & Plan:  1. Herpes zoster Rash resolving, still significant crusting. See instruction for skin care & pain management - HYDROcodone-acetaminophen (NORCO/VICODIN) 5-325 MG per tablet; Take 1 tablet by mouth every 12 (twelve) hours.  Dispense: 30 tablet; Refill: 0  2. DM See prob list

## 2013-07-02 NOTE — Progress Notes (Signed)
Pre visit review using our clinic review tool, if applicable. No additional management support is needed unless otherwise documented below in the visit note. 

## 2013-07-02 NOTE — Assessment & Plan Note (Signed)
Continues to have  Pain at rash site.t states pain is internal: catches & pulls w/movement. See instructions for pain mngmt. Rash is resolving. Has 4 cm area of crusted lesions in center of back.  Skin care:use handled brush to reach back. Gently scrub w/ water & dove bar soap daily. Apply vaseline daily until no crust & dry. F/u 2 wks.

## 2013-07-02 NOTE — Patient Instructions (Addendum)
Diabetes: Continue with 30 u every morning & night. No metformin.  For shingles pain: Take 1Tablet hydrocodone up to twice daily if needed. Continue gabapentin 1Tablet 3 times daily and 1Tablet tramadol 3 times daily. You may take 1000 mg tylenol twice daily. You may also use lodine 1Tablet twice daily.  Skin care: use back brush to gently scrub crusted rash on back using dove bar soap. Apply vaseline daily to back until rash is dry & has no crust.  Follow up in 2 weeks for rash.

## 2013-07-09 ENCOUNTER — Telehealth: Payer: Self-pay | Admitting: Pulmonary Disease

## 2013-07-09 DIAGNOSIS — J449 Chronic obstructive pulmonary disease, unspecified: Secondary | ICD-10-CM

## 2013-07-09 NOTE — Telephone Encounter (Signed)
I called spoke with [name].a ware order has been placed

## 2013-07-10 ENCOUNTER — Telehealth: Payer: Self-pay | Admitting: Pulmonary Disease

## 2013-07-10 NOTE — Telephone Encounter (Signed)
Pt calling again a/b prescript give a different fax # [phone].Caren GriffinsStanley A Dalton

## 2013-07-10 NOTE — Telephone Encounter (Signed)
Rx signed and faxed back to Ty at [phone]  Nothing further needed.

## 2013-07-10 NOTE — Telephone Encounter (Signed)
Noted. This has been changed on Rx

## 2013-07-10 NOTE — Telephone Encounter (Signed)
Spoke with [name]- [facility] Needing an Rx faxed over for her nebulizer machine--order was faxed 07/09/13 for new neb machine but for insurance purposes [name] is needing an actual Rx faxed over Can be faxed over on a Rx pad--needing of therapy, NPI, Dr Name, equipment being ordered Patient name/DOB and our contact information. Fax # [phone]  This has been placed on [name] desk to sign.

## 2013-07-10 NOTE — Telephone Encounter (Signed)
Spoke with Ty at [facility] to let her know that Rx is being faxed back---states that Rx needs to go to [phone].  Ty states that she does not know where the other number is to.  Pt aware that Rx being faxed over.   Nothing further needed.

## 2013-07-10 NOTE — Telephone Encounter (Signed)
Ty from [facility] has called back about order to be faxed to them.

## 2013-07-11 ENCOUNTER — Other Ambulatory Visit: Payer: Self-pay | Admitting: Pulmonary Disease

## 2013-07-14 ENCOUNTER — Telehealth: Payer: Self-pay | Admitting: Pulmonary Disease

## 2013-07-14 DIAGNOSIS — J449 Chronic obstructive pulmonary disease, unspecified: Secondary | ICD-10-CM

## 2013-07-14 NOTE — Telephone Encounter (Signed)
Spke with pt. She reports her neb machine stopped working. Wants an order sent to [facility]. I have placed order. Nothing further needed

## 2013-07-16 ENCOUNTER — Encounter: Payer: Self-pay | Admitting: Family Medicine

## 2013-07-16 ENCOUNTER — Ambulatory Visit (INDEPENDENT_AMBULATORY_CARE_PROVIDER_SITE_OTHER): Payer: Medicare Other | Admitting: Family Medicine

## 2013-07-16 VITALS — BP 173/84 | HR 96 | Temp 97.3°F | Resp 18 | Ht 64.5 in | Wt 202.0 lb

## 2013-07-16 DIAGNOSIS — I2510 Atherosclerotic heart disease of native coronary artery without angina pectoris: Secondary | ICD-10-CM

## 2013-07-16 DIAGNOSIS — B029 Zoster without complications: Secondary | ICD-10-CM

## 2013-07-16 NOTE — Progress Notes (Signed)
OFFICE NOTE  07/16/2013  CC:  Chief Complaint  Patient presents with  . Follow-up     HPI: Patient is a 73 y.o. Caucasian female who is here for 2 wk f/u zoster rash. She is s/p prednisone and valtrex treatment. Rash on right sidedoing good.  All lesions dried, only one still with a scab.  No pain.  Pertinent PMH:  Past medical, surgical, social, and family history reviewed and no changes are noted since last office visit.  MEDS:  Outpatient Prescriptions Prior to Visit  Medication Sig Dispense Refill  . acetaminophen (TYLENOL) 500 MG tablet Take 500 mg by mouth every 6 (six) hours as needed. pain      . albuterol (PROVENTIL HFA;VENTOLIN HFA) 108 (90 BASE) MCG/ACT inhaler Inhale 2 puffs into the lungs every 6 (six) hours as needed for wheezing or shortness of breath.  1 Inhaler  6  . albuterol (PROVENTIL) (2.5 MG/3ML) 0.083% nebulizer solution Take 3 mLs (2.5 mg total) by nebulization 4 (four) times daily.  75 mL  11  . ALPRAZolam (XANAX) 0.5 MG tablet TAKE ONE -HALF TO ONE TABLET BY MOUTH THREE TIMES DAILY AS NEEDED  90 tablet  0  . aspirin 81 MG tablet Take 81 mg by mouth daily.      Marland Kitchen. atenolol (TENORMIN) 50 MG tablet TAKE ONE TABLET BY MOUTH ONCE DAILY  30 tablet  3  . budesonide (PULMICORT) 0.25 MG/2ML nebulizer solution Take 2 mLs (0.25 mg total) by nebulization 2 (two) times daily.  60 mL  11  . calcium-vitamin D (OSCAL WITH D) 500-200 MG-UNIT per tablet Take 1 tablet by mouth 2 (two) times daily.        . Cholecalciferol (VITAMIN D3) 1000 UNITS CAPS Take 1 capsule by mouth daily.       Marland Kitchen. dextromethorphan-guaiFENesin (MUCINEX DM) 30-600 MG per 12 hr tablet Take 2 tablets by mouth every 12 (twelve) hours.        Marland Kitchen. etodolac (LODINE) 400 MG tablet TAKE ONE TABLET BY MOUTH EVERY 12 HOURS  60 tablet  3  . ferrous sulfate 325 (65 FE) MG tablet Take 325 mg by mouth daily with breakfast.       . fluticasone (FLONASE) 50 MCG/ACT nasal spray USE TWO SPRAY IN EACH NOSTRIL ONCE DAILY  16 g   6  . furosemide (LASIX) 40 MG tablet TAKE ONE TABLET BY MOUTH ONCE DAILY  30 tablet  5  . gabapentin (NEURONTIN) 300 MG capsule TAKE ONE CAPSULE BY MOUTH THREE TIMES DAILY  30 capsule  0  . glucose blood (ONE TOUCH ULTRA TEST) test strip TEST BLOOD SUGAR LEVELS THREE TIMES DAILY  100 each  5  . HUMULIN 70/30 (70-30) 100 UNIT/ML injection INJECT 30 UNITS IN THE MORNING AND 30 UNITS IN THE EVENING  20 mL  0  . HYDROcodone-acetaminophen (NORCO/VICODIN) 5-325 MG per tablet Take 1 tablet by mouth every 12 (twelve) hours.  30 tablet  0  . ibandronate (BONIVA) 150 MG tablet TAKE ONE TABLET BY MOUTH ONCE A MONTH  1 tablet  6  . insulin NPH-regular Human (NOVOLIN 70/30) (70-30) 100 UNIT/ML injection INJECT 25UNITS IN THE MORNING AND 25  UNITS IN THE EVENING.  10 mL  6  . ipratropium (ATROVENT) 0.02 % nebulizer solution Take 2.5 mLs (500 mcg total) by nebulization 4 (four) times daily.  75 mL  11  . isosorbide mononitrate (IMDUR) 30 MG 24 hr tablet Take 1 tablet (30 mg total) by mouth daily.  90 tablet  1  . losartan (COZAAR) 50 MG tablet TAKE ONE TABLET BY MOUTH ONCE DAILY  30 tablet  6  . methocarbamol (ROBAXIN) 500 MG tablet Take 1 tablet (500 mg total) by mouth 2 (two) times daily as needed for muscle spasms.  10 tablet  0  . nitroGLYCERIN (NITROSTAT) 0.4 MG SL tablet Place 1 tablet (0.4 mg total) under the tongue every 5 (five) minutes as needed. May repeat x3  25 tablet  6  . omeprazole (PRILOSEC) 20 MG capsule TAKE ONE CAPSULE BY MOUTH ONCE DAILY 30  MINUTES  BEFORE  A  MEAL  30 capsule  5  . ONE TOUCH LANCETS MISC Check blood sugars two times daily  100 each  6  . Probiotic Product (PROBIOTIC DAILY PO) Take 1 tablet by mouth daily.       Marland Kitchen. PROLENSA 0.07 % SOLN Place 1 drop into both eyes 4 (four) times daily.      Marland Kitchen. RELION INSULIN SYR 0.5CC/30G 30G X 5/16" 0.5 ML MISC USE TO TEST BLOOD SUGAR LEVELS THREE TIMES DAILY  100 each  0  . simvastatin (ZOCOR) 80 MG tablet TAKE ONE TABLET BY MOUTH EVERY DAY  AT BEDTIME  30 tablet  6  . traMADol (ULTRAM) 50 MG tablet TAKE ONE TABLET BY MOUTH EVERY 8 HOURS AS NEEDED FOR PAIN  90 tablet  2  . valACYclovir (VALTREX) 1000 MG tablet Take 1 tablet (1,000 mg total) by mouth 3 (three) times daily.  21 tablet  0  . vitamin B-12 (CYANOCOBALAMIN) 500 MCG tablet Take 1,000 mcg by mouth daily.       . vitamin C (ASCORBIC ACID) 500 MG tablet Take 500 mg by mouth daily.       No facility-administered medications prior to visit.    PE: Blood pressure 173/84, pulse 96, temperature 97.3 F (36.3 C), temperature source Oral, resp. rate 18, height 5' 4.5" (1.638 m), weight 202 lb (91.627 kg), SpO2 96.00%. Right side of torso with splotchy patches of hyperpigmented skin--in clusters--with one lesion with large scab near midline of her back (these hyperpigmentation changes were in same dermatomal distribution as her prior zoster rash. No tenderness, hypesthesia, or numbness.  IMPRESSION AND PLAN:  Herpes zoster Resolving appropriately. No sign of complication. Reassured patient.   An After Visit Summary was printed and given to the patient.   FOLLOW UP: 4 mo routine chronic illness f/u

## 2013-07-16 NOTE — Progress Notes (Signed)
Pre visit review using our clinic review tool, if applicable. No additional management support is needed unless otherwise documented below in the visit note. 

## 2013-07-27 NOTE — Assessment & Plan Note (Signed)
Resolving appropriately. No sign of complication. Reassured patient.

## 2013-07-30 ENCOUNTER — Other Ambulatory Visit: Payer: Self-pay | Admitting: Pulmonary Disease

## 2013-08-03 ENCOUNTER — Encounter: Payer: Self-pay | Admitting: Pulmonary Disease

## 2013-08-03 ENCOUNTER — Ambulatory Visit: Payer: Medicare Other | Admitting: Pulmonary Disease

## 2013-08-03 ENCOUNTER — Ambulatory Visit (INDEPENDENT_AMBULATORY_CARE_PROVIDER_SITE_OTHER): Payer: Medicare Other | Admitting: Pulmonary Disease

## 2013-08-03 VITALS — BP 126/80 | HR 60 | Temp 98.6°F | Ht 64.5 in | Wt 206.2 lb

## 2013-08-03 DIAGNOSIS — I872 Venous insufficiency (chronic) (peripheral): Secondary | ICD-10-CM

## 2013-08-03 DIAGNOSIS — J449 Chronic obstructive pulmonary disease, unspecified: Secondary | ICD-10-CM

## 2013-08-03 DIAGNOSIS — I2510 Atherosclerotic heart disease of native coronary artery without angina pectoris: Secondary | ICD-10-CM

## 2013-08-03 DIAGNOSIS — R0902 Hypoxemia: Secondary | ICD-10-CM

## 2013-08-03 DIAGNOSIS — E669 Obesity, unspecified: Secondary | ICD-10-CM

## 2013-08-03 DIAGNOSIS — I2589 Other forms of chronic ischemic heart disease: Secondary | ICD-10-CM

## 2013-08-03 DIAGNOSIS — J9611 Chronic respiratory failure with hypoxia: Secondary | ICD-10-CM

## 2013-08-03 DIAGNOSIS — J9610 Chronic respiratory failure, unspecified whether with hypoxia or hypercapnia: Secondary | ICD-10-CM

## 2013-08-03 DIAGNOSIS — I255 Ischemic cardiomyopathy: Secondary | ICD-10-CM

## 2013-08-03 DIAGNOSIS — I10 Essential (primary) hypertension: Secondary | ICD-10-CM

## 2013-08-03 NOTE — Patient Instructions (Signed)
Today we updated your med list in our EPIC system...    Continue your current medications the same...  Continue the NEBULIZER w/ the Albuterol & Ipratropium 4 times daily, and the Budesonide twice daily...    Continue the Mucinex 600mg - 2 tabs twice daily & lots of water by mouth...    You can try the "postural drainage" therapy as we described...  Today we rechecked your pulmonary function test and checked your oxygen while walking your O2 at 2L/min...  Call for any questions...  Let's plan a follow up visit in 52mo, sooner if needed for breathing problems.Marland KitchenMarland Kitchen

## 2013-08-07 ENCOUNTER — Telehealth: Payer: Self-pay | Admitting: Pulmonary Disease

## 2013-08-07 ENCOUNTER — Other Ambulatory Visit: Payer: Self-pay | Admitting: *Deleted

## 2013-08-07 MED ORDER — SIMVASTATIN 80 MG PO TABS: ORAL_TABLET | ORAL | Status: DC

## 2013-08-07 MED ORDER — ALPRAZOLAM 0.5 MG PO TABS
ORAL_TABLET | ORAL | Status: DC
Start: 2013-08-07 — End: 2013-09-08

## 2013-08-07 MED ORDER — GABAPENTIN 300 MG PO CAPS: ORAL_CAPSULE | ORAL | Status: DC

## 2013-08-07 NOTE — Telephone Encounter (Signed)
Called and spoke with pt and she stated that she has been trying to get these medications refilled since last week.  Alprazolam, simvastatin and gabapentin.  Pt stated that she has been back and forth with the pharmacy and Dr. Verdis Frederickson office to fill these meds for her. Per SN---ok to refill these for the pt this time.  These have been called to the pharmacy per pts request and the pharmacy will try and send the next refill request to Dr. Milinda Cave.

## 2013-08-07 NOTE — Telephone Encounter (Signed)
Please advise refills for pt. Dr. Kriste Basque has been prescribing these. Alprazolzam 0.5 mg, gabapentin 300 mg, and simvastatin 80 mg. Last seen 06/18/2013. Has no f/u appt.

## 2013-08-10 ENCOUNTER — Other Ambulatory Visit: Payer: Self-pay | Admitting: Family Medicine

## 2013-08-10 MED ORDER — SIMVASTATIN 80 MG PO TABS: ORAL_TABLET | ORAL | Status: DC

## 2013-08-10 NOTE — Telephone Encounter (Signed)
Pt requesting rf of alprazolam.  Looks like a CMA from Dr. Kriste Basque filled this Rx on 08/07/13.

## 2013-08-12 ENCOUNTER — Other Ambulatory Visit: Payer: Self-pay | Admitting: Pulmonary Disease

## 2013-08-14 ENCOUNTER — Telehealth: Payer: Self-pay | Admitting: Family Medicine

## 2013-08-14 NOTE — Telephone Encounter (Signed)
[facility] pharmacy called back stating that pt's insurance will pay better for humalin instead of novolin.  I made the switch for patient's insurance.

## 2013-08-14 NOTE — Telephone Encounter (Signed)
Patient called yelling that we refused her medication.  I told patient that I have not refused any medication and that she was yelling at the wrong person.  Patient continued to yell and I told her I would call her pharmacy and hung up the phone.   [facility] and they said they never received the last novolin Rx on 06/18/13 x 6 rfs.  I gave her verbal orders to fill that medication with 6 rfs as previously Rx'ed.   I called patient back and let her know that her medication was at the pharmacy.

## 2013-08-15 ENCOUNTER — Encounter: Payer: Self-pay | Admitting: Family Medicine

## 2013-08-15 NOTE — Telephone Encounter (Signed)
Noted  

## 2013-08-29 ENCOUNTER — Emergency Department (HOSPITAL_COMMUNITY): Payer: Medicare Other

## 2013-08-29 ENCOUNTER — Encounter (HOSPITAL_COMMUNITY): Payer: Self-pay | Admitting: Emergency Medicine

## 2013-08-29 ENCOUNTER — Emergency Department (HOSPITAL_COMMUNITY)
Admission: EM | Admit: 2013-08-29 | Discharge: 2013-08-29 | Disposition: A | Discharge status: Home / Self Care | Payer: Medicare Other | Attending: Emergency Medicine | Admitting: Emergency Medicine

## 2013-08-29 DIAGNOSIS — W06XXXA Fall from bed, initial encounter: Secondary | ICD-10-CM | POA: Insufficient documentation

## 2013-08-29 DIAGNOSIS — E119 Type 2 diabetes mellitus without complications: Secondary | ICD-10-CM | POA: Insufficient documentation

## 2013-08-29 DIAGNOSIS — Z7982 Long term (current) use of aspirin: Secondary | ICD-10-CM | POA: Insufficient documentation

## 2013-08-29 DIAGNOSIS — M1710 Unilateral primary osteoarthritis, unspecified knee: Secondary | ICD-10-CM | POA: Insufficient documentation

## 2013-08-29 DIAGNOSIS — Y929 Unspecified place or not applicable: Secondary | ICD-10-CM | POA: Insufficient documentation

## 2013-08-29 DIAGNOSIS — G4730 Sleep apnea, unspecified: Secondary | ICD-10-CM | POA: Insufficient documentation

## 2013-08-29 DIAGNOSIS — Z8739 Personal history of other diseases of the musculoskeletal system and connective tissue: Secondary | ICD-10-CM | POA: Insufficient documentation

## 2013-08-29 DIAGNOSIS — Z9861 Coronary angioplasty status: Secondary | ICD-10-CM | POA: Insufficient documentation

## 2013-08-29 DIAGNOSIS — I10 Essential (primary) hypertension: Secondary | ICD-10-CM | POA: Insufficient documentation

## 2013-08-29 DIAGNOSIS — Z794 Long term (current) use of insulin: Secondary | ICD-10-CM | POA: Insufficient documentation

## 2013-08-29 DIAGNOSIS — E669 Obesity, unspecified: Secondary | ICD-10-CM | POA: Insufficient documentation

## 2013-08-29 DIAGNOSIS — W19XXXA Unspecified fall, initial encounter: Secondary | ICD-10-CM

## 2013-08-29 DIAGNOSIS — J441 Chronic obstructive pulmonary disease with (acute) exacerbation: Secondary | ICD-10-CM | POA: Insufficient documentation

## 2013-08-29 DIAGNOSIS — Y9389 Activity, other specified: Secondary | ICD-10-CM | POA: Insufficient documentation

## 2013-08-29 DIAGNOSIS — F411 Generalized anxiety disorder: Secondary | ICD-10-CM | POA: Insufficient documentation

## 2013-08-29 DIAGNOSIS — Z8619 Personal history of other infectious and parasitic diseases: Secondary | ICD-10-CM | POA: Insufficient documentation

## 2013-08-29 DIAGNOSIS — I2510 Atherosclerotic heart disease of native coronary artery without angina pectoris: Secondary | ICD-10-CM | POA: Insufficient documentation

## 2013-08-29 DIAGNOSIS — E785 Hyperlipidemia, unspecified: Secondary | ICD-10-CM | POA: Insufficient documentation

## 2013-08-29 DIAGNOSIS — Z9981 Dependence on supplemental oxygen: Secondary | ICD-10-CM | POA: Insufficient documentation

## 2013-08-29 DIAGNOSIS — D649 Anemia, unspecified: Secondary | ICD-10-CM | POA: Insufficient documentation

## 2013-08-29 DIAGNOSIS — Z87891 Personal history of nicotine dependence: Secondary | ICD-10-CM | POA: Insufficient documentation

## 2013-08-29 DIAGNOSIS — J45901 Unspecified asthma with (acute) exacerbation: Secondary | ICD-10-CM

## 2013-08-29 DIAGNOSIS — S298XXA Other specified injuries of thorax, initial encounter: Secondary | ICD-10-CM | POA: Insufficient documentation

## 2013-08-29 DIAGNOSIS — Z79899 Other long term (current) drug therapy: Secondary | ICD-10-CM | POA: Insufficient documentation

## 2013-08-29 DIAGNOSIS — IMO0002 Reserved for concepts with insufficient information to code with codable children: Secondary | ICD-10-CM

## 2013-08-29 MED ORDER — HYDROCODONE-ACETAMINOPHEN 5-325 MG PO TABS
1.0000 | ORAL_TABLET | Freq: Once | ORAL | Status: AC
  Administered 2013-08-29: 1 via ORAL
  Filled 2013-08-29: qty 1

## 2013-08-29 MED ORDER — HYDROCODONE-ACETAMINOPHEN 5-325 MG PO TABS: 1.0000 | ORAL_TABLET | Freq: Four times a day (QID) | ORAL | Status: DC | PRN

## 2013-08-29 NOTE — ED Provider Notes (Signed)
CSN: [id number]     Arrival date & time 08/29/13  1240 History  This chart was scribed for Gerhard Munch, MD by [place] Peace, ED Scribe. The patient was seen in [facility]. The patient's care was started at 1:31 PM.     Chief Complaint  Patient presents with  . Fall     Patient is a 73 y.o. female presenting with fall. The history is provided by the patient. No language interpreter was used.  Fall The current episode started 6 to 12 hours ago. The problem has not changed since onset.Associated symptoms include shortness of breath. The symptoms are relieved by rest. She has tried acetaminophen for the symptoms.  HPI Comments: Emanuella REESHEMAH NAZARYAN is a 73 y.o. female who presents to the Emergency Department complaining of fall that resulted in non-radiating pain specifcally under and around her left breast onset 12 hrs ago with associated SOB. She states that she fell out of bed while sleeping and landed on her arm which was followed by a sharp pain that shot up into her chest. Pt states she does have oxygen at home for use if needed. She denies fever, chills, nausea, vomiting or diarrhea. She further denies history of similar episodes in the past. She reports taking Tramadol and two Tylenols for pain and arthritis without relief.   Past Medical History  Diagnosis Date  . COPD (chronic obstructive pulmonary disease)     oxygen prn ambulation  . Hyperlipidemia   . Essential hypertension, benign   . Anemia   . Anxiety   . Arthritis     L-spine, hips, knees, elbows, wrists, hands  . Coronary atherosclerosis of native coronary artery     PTCA/BMS LAD 2000, PTCA/brachytherapy LAD 2001, LVEF 55%  . Type 2 diabetes mellitus   . Osteoporosis   . Venous insufficiency   . Obesity   . Myocardial infarction     AMI 2000  . Asthma   . Sleep apnea     not on CPAP  . On home O2     prn  . DDD (degenerative disc disease), lumbar   . Herpes zoster 06/2013   Past Surgical History  Procedure Laterality  Date  . Total abdominal hysterectomy      DUB (non-malignant reasons).  No hx of abnormal pap smears.  . Cholecystectomy    . Rotator cuff repair Right   . Cataract extraction w/phaco Left 06/30/2012    Procedure: CATARACT EXTRACTION PHACO AND INTRAOCULAR LENS PLACEMENT (IOC);  Surgeon: Gemma Payor, MD;  Location: [facility];  Service: Ophthalmology;  Laterality: Left;  CDE:12.32  . Coronary angioplasty with stent placement      Multiple   Family History  Problem Relation Age of Onset  . Coronary artery disease Other    History  Substance Use Topics  . Smoking status: Former Smoker -- 1.00 packs/day for 30 years    Types: Cigarettes    Quit date: 03/05/1998  . Smokeless tobacco: Not on file  . Alcohol Use: No   OB History   Grav Para Term Preterm Abortions TAB SAB Ect Mult Living                 Review of Systems  Constitutional: Negative for fever and chills.  Respiratory: Positive for shortness of breath.   Gastrointestinal: Negative for nausea, vomiting and diarrhea.  Musculoskeletal:       Left sided pain around her left breast.       Allergies  Codeine and  Terbutaline sulfate  Home Medications   Prior to Admission medications   Medication Sig Start Date End Date Taking? Authorizing Provider  acetaminophen (TYLENOL) 500 MG tablet Take 1,000 mg by mouth every 8 (eight) hours. pain   Yes Historical Provider, MD  albuterol (PROVENTIL HFA;VENTOLIN HFA) 108 (90 BASE) MCG/ACT inhaler Inhale 2 puffs into the lungs every 6 (six) hours as needed for wheezing or shortness of breath. 02/02/13  Yes Michele McalpineScott M Nadel, MD  albuterol (PROVENTIL) (2.5 MG/3ML) 0.083% nebulizer solution Take 3 mLs (2.5 mg total) by nebulization 4 (four) times daily. 06/07/11  Yes Michele McalpineScott M Nadel, MD  ALPRAZolam Prudy Feeler(XANAX) 0.5 MG tablet TAKE ONE -HALF TO ONE TABLET BY MOUTH THREE TIMES DAILY AS NEEDED 08/07/13  Yes Michele McalpineScott M Nadel, MD  aspirin 81 MG tablet Take 81 mg by mouth daily.   Yes Historical Provider, MD   atenolol (TENORMIN) 50 MG tablet TAKE ONE TABLET BY MOUTH ONCE DAILY   Yes Michele McalpineScott M Nadel, MD  budesonide (PULMICORT) 0.25 MG/2ML nebulizer solution Take 2 mLs (0.25 mg total) by nebulization 2 (two) times daily. 06/07/11  Yes Michele McalpineScott M Nadel, MD  calcium-vitamin D (OSCAL WITH D) 500-200 MG-UNIT per tablet Take 1 tablet by mouth 2 (two) times daily.     Yes Historical Provider, MD  Cholecalciferol (VITAMIN D3) 1000 UNITS CAPS Take 1 capsule by mouth daily.    Yes Historical Provider, MD  dextromethorphan-guaiFENesin (MUCINEX DM) 30-600 MG per 12 hr tablet Take 2 tablets by mouth every 12 (twelve) hours.     Yes Historical Provider, MD  etodolac (LODINE) 400 MG tablet TAKE ONE TABLET BY MOUTH EVERY 12 HOURS 08/23/10  Yes Michele McalpineScott M Nadel, MD  ferrous sulfate 325 (65 FE) MG tablet Take 325 mg by mouth daily with breakfast.    Yes Historical Provider, MD  fluticasone (FLONASE) 50 MCG/ACT nasal spray USE TWO SPRAY IN EACH NOSTRIL ONCE DAILY 01/30/13  Yes Michele McalpineScott M Nadel, MD  furosemide (LASIX) 40 MG tablet TAKE ONE TABLET BY MOUTH ONCE DAILY 03/31/13  Yes Michele McalpineScott M Nadel, MD  gabapentin (NEURONTIN) 300 MG capsule TAKE ONE CAPSULE BY MOUTH THREE TIMES DAILY 08/07/13  Yes Michele McalpineScott M Nadel, MD  glucose blood (ONE TOUCH ULTRA TEST) test strip TEST BLOOD SUGAR LEVELS THREE TIMES DAILY 04/15/13  Yes Michele McalpineScott M Nadel, MD  ibandronate (BONIVA) 150 MG tablet TAKE ONE TABLET BY MOUTH ONCE A MONTH 01/30/13  Yes Michele McalpineScott M Nadel, MD  insulin NPH-regular Human (NOVOLIN 70/30) (70-30) 100 UNIT/ML injection Inject 35 Units into the skin 2 (two) times daily with a meal.   Yes Historical Provider, MD  ipratropium (ATROVENT) 0.02 % nebulizer solution Take 2.5 mLs (500 mcg total) by nebulization 4 (four) times daily. 06/07/11  Yes Michele McalpineScott M Nadel, MD  isosorbide mononitrate (IMDUR) 30 MG 24 hr tablet Take 1 tablet (30 mg total) by mouth daily. 07/02/13  Yes Jeoffrey MassedPhilip H McGowen, MD  losartan (COZAAR) 50 MG tablet TAKE ONE TABLET BY MOUTH ONCE DAILY  03/02/13  Yes Michele McalpineScott M Nadel, MD  nitroGLYCERIN (NITROSTAT) 0.4 MG SL tablet Place 1 tablet (0.4 mg total) under the tongue every 5 (five) minutes as needed. May repeat x3 07/23/12  Yes Dyann KiefMichele M Lenze, PA-C  omeprazole (PRILOSEC) 20 MG capsule TAKE ONE CAPSULE BY MOUTH ONCE DAILY 30  MINUTES  BEFORE  A  MEAL 04/29/13  Yes Michele McalpineScott M Nadel, MD  ONE [facility]ANC[facility] MISC Check blood sugars two times daily 10/21/12  Yes Michele McalpineScott M Nadel,  MD  Probiotic Product (PROBIOTIC DAILY PO) Take 1 tablet by mouth daily.    Yes Historical Provider, MD  RELION INSULIN SYR 0.5CC/30G 30G X 5/16" 0.5 ML MISC USE TO TEST BLOOD SUGAR LEVELS THREE TIMES DAILY 07/01/13  Yes Michele Mcalpine, MD  simvastatin (ZOCOR) 80 MG tablet TAKE ONE TABLET BY MOUTH EVERY DAY AT BEDTIME 08/10/13  Yes Jeoffrey Massed, MD  traMADol (ULTRAM) 50 MG tablet TAKE ONE TABLET BY MOUTH EVERY 8 HOURS AS NEEDED FOR PAIN 06/19/13  Yes Jeoffrey Massed, MD  vitamin B-12 (CYANOCOBALAMIN) 500 MCG tablet Take 500 mcg by mouth daily.    Yes Historical Provider, MD  vitamin C (ASCORBIC ACID) 500 MG tablet Take 500 mg by mouth daily.   Yes Historical Provider, MD   Triage Vitals: BP 166/108  Pulse 72  Temp(Src) 98 F (36.7 C) (Oral)  Resp 20  Ht 5\' 4"  (1.626 m)  Wt 202 lb (91.627 kg)  BMI 34.66 kg/m2  SpO2 96% Physical Exam  Nursing note and vitals reviewed. Constitutional: She is oriented to person, place, and time. She appears well-developed and well-nourished. No distress.  HENT:  Head: Normocephalic and atraumatic.  Eyes: Conjunctivae and EOM are normal.  Neck: Neck supple. No tracheal deviation present.  Cardiovascular: Normal rate and regular rhythm.   Pulmonary/Chest: Effort normal. No respiratory distress. She exhibits tenderness (Left side).  Abdominal: Bowel sounds are normal. There is no tenderness.  Musculoskeletal: Normal range of motion.  Neurological: She is alert and oriented to person, place, and time.  Skin: Skin is warm and dry.   Psychiatric: She has a normal mood and affect. Her behavior is normal.    ED Course  Procedures (including critical care time) DIAGNOSTIC STUDIES: Oxygen Saturation is 96% on room air, adequate by my interpretation.    COORDINATION OF CARE: 1:36 PM- Treatment plan was discussed with patient who verbalizes understanding and agrees.      Imaging Review Dg Chest 2 View  08/29/2013   CLINICAL DATA:  Upper anterior left rib pain after fall this morning.  EXAM: CHEST  2 VIEW  COMPARISON:  06/02/2013  FINDINGS: The cardiomediastinal silhouette remains enlarged, unchanged. The lungs remain hyperinflated. Linear opacities in the left lung base are unchanged and consistent with subsegmental atelectasis or scarring. Minimal atelectasis is noted in the right lung base. No segmental airspace consolidation, edema, pleural effusion, or pneumothorax is identified. No acute osseous abnormality is identified.  IMPRESSION: Unchanged appearance of the chest without evidence of acute abnormality.   Electronically Signed   By: Sebastian Ache   On: 08/29/2013 14:46   Dg Ribs Unilateral Left  08/29/2013   CLINICAL DATA:  Fall.  Left rib pain.  EXAM: LEFT RIBS - 2 VIEW  COMPARISON:  None.  FINDINGS: No fracture or other bone lesions are seen involving the ribs. Linear atelectasis at the left lung base.  IMPRESSION: No acute bony injury.   Electronically Signed   By: Maryclare Bean M.D.   On: 08/29/2013 14:39     Medications  HYDROcodone-acetaminophen (NORCO/VICODIN) 5-325 MG per tablet 1 tablet (1 tablet Oral Given 08/29/13 1341)   3:19 PM On repeat exam the patient is awake alert, and in no distress.  The pain is decreased.  MDM   Final diagnoses:  Fall, initial encounter   Patient presents after a mechanical fall with pain in the left chest area.  Evaluation largely reassuring with no evidence of fracture, pneumothorax, distress.  Patient was discharged  with analgesia, primary care followup.  I personally  performed the services described in this documentation, which was scribed in my presence. The recorded information has been reviewed and is accurate.     Gerhard Munchobert Raylynn Hersh, MD 08/29/13 1520

## 2013-08-29 NOTE — ED Notes (Signed)
Pt states that she asleep in her bed and woke up on the floor, states "I fell out of the bed while i was asleep", c/o pain to left breast rib area, increases with sob,

## 2013-08-31 ENCOUNTER — Other Ambulatory Visit: Payer: Self-pay | Admitting: Pulmonary Disease

## 2013-09-01 ENCOUNTER — Other Ambulatory Visit: Payer: Self-pay | Admitting: Pulmonary Disease

## 2013-09-08 ENCOUNTER — Other Ambulatory Visit: Payer: Self-pay | Admitting: *Deleted

## 2013-09-08 ENCOUNTER — Telehealth: Payer: Self-pay | Admitting: Family Medicine

## 2013-09-08 MED ORDER — FLUTICASONE PROPIONATE 50 MCG/ACT NA SUSP: NASAL | Status: DC

## 2013-09-08 MED ORDER — ALPRAZOLAM 0.5 MG PO TABS: ORAL_TABLET | ORAL | Status: DC

## 2013-09-08 MED ORDER — IBANDRONATE SODIUM 150 MG PO TABS: ORAL_TABLET | ORAL | Status: DC

## 2013-09-08 MED ORDER — "INSULIN SYRINGE-NEEDLE U-100 30G X 5/16"" 0.5 ML MISC": Status: DC

## 2013-09-08 MED ORDER — ATENOLOL 50 MG PO TABS: ORAL_TABLET | ORAL | Status: DC

## 2013-09-08 NOTE — Telephone Encounter (Signed)
Please advise refills 

## 2013-09-08 NOTE — Telephone Encounter (Signed)
Patient requesting RF of xanax.  Last RX was printed 08/07/13 # 90 by Dr. Kriste BasqueNadel per telephone note.  Please advise refill.

## 2013-09-08 NOTE — Telephone Encounter (Signed)
Rx sent into [facility]  

## 2013-09-08 NOTE — Telephone Encounter (Signed)
Patient is also requesting a refill on [facility] [place]

## 2013-09-09 ENCOUNTER — Encounter: Payer: Self-pay | Admitting: Cardiology

## 2013-09-09 ENCOUNTER — Ambulatory Visit (INDEPENDENT_AMBULATORY_CARE_PROVIDER_SITE_OTHER): Payer: Medicare Other | Admitting: Cardiology

## 2013-09-09 VITALS — BP 148/82 | HR 58 | Ht 64.0 in | Wt 207.0 lb

## 2013-09-09 DIAGNOSIS — I2589 Other forms of chronic ischemic heart disease: Secondary | ICD-10-CM

## 2013-09-09 DIAGNOSIS — E785 Hyperlipidemia, unspecified: Secondary | ICD-10-CM

## 2013-09-09 DIAGNOSIS — J449 Chronic obstructive pulmonary disease, unspecified: Secondary | ICD-10-CM

## 2013-09-09 DIAGNOSIS — I255 Ischemic cardiomyopathy: Secondary | ICD-10-CM

## 2013-09-09 DIAGNOSIS — I2510 Atherosclerotic heart disease of native coronary artery without angina pectoris: Secondary | ICD-10-CM

## 2013-09-09 NOTE — Patient Instructions (Signed)
Your physician wants you to follow-up in: 6 months You will receive a reminder letter in the mail two months in advance. If you don't receive a letter, please call our office to schedule the follow-up appointment.     Your physician recommends that you continue on your current medications as directed. Please refer to the Current Medication list given to you today.      Thank you for choosing [facility] !        

## 2013-09-09 NOTE — Progress Notes (Signed)
Clinical Summary [name] is a 73 y.o.female last seen in January of this year. She has been seen in the ER since that time, most recently in June after a mechanical fall. From a cardiac perspective she has had no new symptomatology on medical therapy. No regular angina symptoms or cardiac hospitalizations.  Lab work from March of this year showed potassium 4.0, BUN 13, creatinine 1.0, troponin I less than 0.3, hemoglobin 13.9, platelets 242.  She continues on oxygen, has followup with Dr. Kriste Becker.  Cardiac catheterization in May 2014 demonstrated 50-75% stenoses within the proximal to mid LAD, occluded diagonal, no significant circumflex distribution disease, mild RCA disease, patent SVG to diagonal with 30-40% distal anastomotic stenosis, patent LIMA to LAD, LVEF 65%. Based on this anatomy, medical therapy was recommended.   Allergies  Allergen Reactions  . Codeine Nausea And Vomiting    vomiting  . Terbutaline Sulfate     REACTION: INTOL to terbutaline    Current Outpatient Prescriptions  Medication Sig Dispense Refill  . albuterol (PROVENTIL HFA;VENTOLIN HFA) 108 (90 BASE) MCG/ACT inhaler Inhale 2 puffs into the lungs every 6 (six) hours as needed for wheezing or shortness of breath.  1 Inhaler  6  . albuterol (PROVENTIL) (2.5 MG/3ML) 0.083% nebulizer solution Take 3 mLs (2.5 mg total) by nebulization 4 (four) times daily.  75 mL  11  . ALPRAZolam (XANAX) 0.5 MG tablet TAKE ONE -HALF TO ONE TABLET BY MOUTH THREE TIMES DAILY AS NEEDED  90 tablet  2  . aspirin 81 MG tablet Take 81 mg by mouth daily.      Marland Kitchen. atenolol (TENORMIN) 50 MG tablet TAKE ONE TABLET BY MOUTH ONCE DAILY  30 tablet  3  . budesonide (PULMICORT) 0.25 MG/2ML nebulizer solution Take 2 mLs (0.25 mg total) by nebulization 2 (two) times daily.  60 mL  11  . calcium-vitamin D (OSCAL WITH D) 500-200 MG-UNIT per tablet Take 1 tablet by mouth 2 (two) times daily.        . Cholecalciferol (VITAMIN D3) 1000 UNITS CAPS Take 1  capsule by mouth daily.       Marland Kitchen. dextromethorphan-guaiFENesin (MUCINEX DM) 30-600 MG per 12 hr tablet Take 2 tablets by mouth every 12 (twelve) hours.        Marland Kitchen. etodolac (LODINE) 400 MG tablet TAKE ONE TABLET BY MOUTH EVERY 12 HOURS  60 tablet  3  . ferrous sulfate 325 (65 FE) MG tablet Take 325 mg by mouth daily with breakfast.       . fluticasone (FLONASE) 50 MCG/ACT nasal spray USE TWO SPRAY IN EACH NOSTRIL ONCE DAILY  16 g  3  . furosemide (LASIX) 40 MG tablet TAKE ONE TABLET BY MOUTH ONCE DAILY  30 tablet  5  . gabapentin (NEURONTIN) 300 MG capsule TAKE ONE CAPSULE BY MOUTH THREE TIMES DAILY  90 capsule  0  . glucose blood (ONE TOUCH ULTRA TEST) test strip TEST BLOOD SUGAR LEVELS THREE TIMES DAILY  100 each  5  . HYDROcodone-acetaminophen (NORCO/VICODIN) 5-325 MG per tablet Take 1 tablet by mouth every 6 (six) hours as needed for moderate pain.  15 tablet  0  . ibandronate (BONIVA) 150 MG tablet TAKE ONE TABLET BY MOUTH ONCE A MONTH  1 tablet  3  . insulin NPH-regular Human (NOVOLIN 70/30) (70-30) 100 UNIT/ML injection Inject 30 Units into the skin 2 (two) times daily with a meal.       . Insulin Syringe-Needle U-100 (RELION INSULIN  SYR 0.5CC/30G) 30G X 5/16" 0.5 ML MISC USE TO TEST BLOOD SUGAR LEVELS THREE TIMES DAILY  100 each  3  . ipratropium (ATROVENT) 0.02 % nebulizer solution Take 2.5 mLs (500 mcg total) by nebulization 4 (four) times daily.  75 mL  11  . isosorbide mononitrate (IMDUR) 30 MG 24 hr tablet Take 1 tablet (30 mg total) by mouth daily.  90 tablet  1  . losartan (COZAAR) 50 MG tablet TAKE ONE TABLET BY MOUTH ONCE DAILY  30 tablet  6  . nitroGLYCERIN (NITROSTAT) 0.4 MG SL tablet Place 1 tablet (0.4 mg total) under the tongue every 5 (five) minutes as needed. May repeat x3  25 tablet  6  . omeprazole (PRILOSEC) 20 MG capsule TAKE ONE CAPSULE BY MOUTH ONCE DAILY 30  MINUTES  BEFORE  A  MEAL  30 capsule  5  . ONE TOUCH LANCETS MISC Check blood sugars two times daily  100 each  6  .  Probiotic Product (PROBIOTIC DAILY PO) Take 1 tablet by mouth daily.       . simvastatin (ZOCOR) 80 MG tablet TAKE ONE TABLET BY MOUTH EVERY DAY AT BEDTIME  30 tablet  3  . traMADol (ULTRAM) 50 MG tablet TAKE ONE TABLET BY MOUTH EVERY 8 HOURS AS NEEDED FOR PAIN  90 tablet  2  . vitamin B-12 (CYANOCOBALAMIN) 500 MCG tablet Take 500 mcg by mouth daily.       . vitamin C (ASCORBIC ACID) 500 MG tablet Take 500 mg by mouth daily.       No current facility-administered medications for this visit.    Past Medical History  Diagnosis Date  . COPD (chronic obstructive pulmonary disease)     Oxygen prn ambulation  . Hyperlipidemia   . Essential hypertension, benign   . Anemia   . Anxiety   . Arthritis     L-spine, hips, knees, elbows, wrists, hands  . Coronary atherosclerosis of native coronary artery     PTCA/BMS LAD 2000, PTCA/brachytherapy LAD 2001, LVEF 55%  . Type 2 diabetes mellitus   . Osteoporosis   . Venous insufficiency   . Obesity   . Myocardial infarction     AMI 2000  . Asthma   . Sleep apnea     Not on CPAP  . DDD (degenerative disc disease), lumbar   . Herpes zoster 06/2013    Past Surgical History  Procedure Laterality Date  . Total abdominal hysterectomy      DUB (non-malignant reasons).  No hx of abnormal pap smears.  . Cholecystectomy    . Rotator cuff repair Right   . Cataract extraction w/phaco Left 06/30/2012    Procedure: CATARACT EXTRACTION PHACO AND INTRAOCULAR LENS PLACEMENT (IOC);  Surgeon: Gemma PayorKerry Hunt, MD;  Location: [facility];  Service: Ophthalmology;  Laterality: Left;  CDE:12.32    Social History [name] reports that she quit smoking about 15 years ago. Her smoking use included Cigarettes. She has a 30 pack-year smoking history. She does not have any smokeless tobacco history on file. [name] reports that she does not drink alcohol.  Review of Systems No palpitations or syncope. No orthopnea or PND. Other systems reviewed and negative except as  outlined.  Physical Examination Filed Vitals:   09/09/13 0920  BP: 148/82  Pulse: 58   Filed Weights   09/09/13 0920  Weight: 207 lb (93.895 kg)    No acute distress. Wearing oxygen via nasal cannula.  HEENT: Conjunctiva and lids  normal, oropharynx with moist mucosa.  Neck: Supple, no elevated JVP or bruits.  Lungs: Diminished breath sounds throughout, prolonged expiratory phase, nonlabored.  Cardiac: Regular rate and rhythm with occasional ectopic beat, no S3 gallop or rub.  Abdomen: Soft, nontender, bowel sounds present.  Skin: Warm and dry.  Extremities: No pitting edema.    Problem List and Plan   CORONARY ATHEROSCLEROSIS NATIVE CORONARY ARTERY Remains clinically stable on medical therapy. No changes made today.  Cardiomyopathy, ischemic LVEF 65% as of cardiac catheterization in May 2014. Continue medical therapy.  COPD Keep followup with Dr. Kriste Basque.  HYPERLIPIDEMIA Continues on Zocor. Keep followup with Dr. Milinda Cave.    Jonelle Sidle, M.D., F.A.C.C.

## 2013-09-09 NOTE — Assessment & Plan Note (Signed)
LVEF 65% as of cardiac catheterization in May 2014. Continue medical therapy.

## 2013-09-09 NOTE — Assessment & Plan Note (Signed)
Keep followup with Dr. Kriste BasqueNadel.

## 2013-09-09 NOTE — Assessment & Plan Note (Signed)
Remains clinically stable on medical therapy. No changes made today.

## 2013-09-09 NOTE — Assessment & Plan Note (Signed)
Continues on Zocor. Keep followup with Dr. Milinda CaveMcgowen.

## 2013-09-10 ENCOUNTER — Other Ambulatory Visit: Payer: Self-pay | Admitting: Family Medicine

## 2013-09-10 MED ORDER — SIMVASTATIN 80 MG PO TABS: ORAL_TABLET | ORAL | Status: DC

## 2013-09-21 ENCOUNTER — Other Ambulatory Visit (INDEPENDENT_AMBULATORY_CARE_PROVIDER_SITE_OTHER): Payer: Medicare Other

## 2013-09-21 DIAGNOSIS — E119 Type 2 diabetes mellitus without complications: Secondary | ICD-10-CM

## 2013-09-21 LAB — HEMOGLOBIN A1C: Hgb A1c MFr Bld: 9.7 % — ABNORMAL HIGH (ref 4.6–6.5)

## 2013-09-22 LAB — LIPID PANEL
CHOL/HDL RATIO: 5
Cholesterol: 167 mg/dL (ref 0–200)
HDL: 36.2 mg/dL — ABNORMAL LOW (ref >39.00)
LDL Cholesterol: 56 mg/dL (ref 0–99)
NONHDL: 130.8
Triglycerides: 376 mg/dL — ABNORMAL HIGH (ref 0.0–149.0)
VLDL: 75.2 mg/dL — AB (ref 0.0–40.0)

## 2013-09-24 MED ORDER — PIOGLITAZONE HCL 15 MG PO TABS: 15.0000 mg | ORAL_TABLET | Freq: Every day | ORAL | Status: DC

## 2013-09-28 ENCOUNTER — Telehealth: Payer: Self-pay | Admitting: Pulmonary Disease

## 2013-09-28 NOTE — Telephone Encounter (Signed)
Will check the DME folder and scan folder to see if these forms are in there to fax back to monique.

## 2013-10-01 NOTE — Telephone Encounter (Signed)
Please advise Marliss CzarLeigh if this was ever taken care of. Thanks.

## 2013-10-05 ENCOUNTER — Other Ambulatory Visit: Payer: Self-pay | Admitting: Family Medicine

## 2013-10-05 MED ORDER — LOSARTAN POTASSIUM 50 MG PO TABS: ORAL_TABLET | ORAL | Status: DC

## 2013-10-06 NOTE — Telephone Encounter (Signed)
All DME forms have been faxed back.  Nothing further is needed

## 2013-10-07 ENCOUNTER — Telehealth: Payer: Self-pay

## 2013-10-07 MED ORDER — TRAMADOL HCL 50 MG PO TABS: ORAL_TABLET | ORAL | Status: DC

## 2013-10-07 NOTE — Telephone Encounter (Signed)
Rx faxed

## 2013-10-07 NOTE — Telephone Encounter (Signed)
Tramadol rx printed. 

## 2013-10-07 NOTE — Telephone Encounter (Signed)
Pt needs a [facility] Rx [facility]. She changed pharmacies and now they need a new Rx. Please advise.

## 2013-10-08 ENCOUNTER — Telehealth: Payer: Self-pay | Admitting: Pulmonary Disease

## 2013-10-08 NOTE — Telephone Encounter (Signed)
Spoke with Gabriel RungMonique  She states that the forms regarding neb meds were never received  She is asking for these to be refaxed  She also faxed a new form SN needs to sign for Budesonide  Leigh, please advise thanks

## 2013-10-09 NOTE — Telephone Encounter (Signed)
Forms have been received and placed on SN cart to be signed.  Will fax these back today.

## 2013-10-09 NOTE — Telephone Encounter (Signed)
[facility] and [facility].  Placed in the scan folder.

## 2013-10-14 ENCOUNTER — Other Ambulatory Visit: Payer: Self-pay | Admitting: Family Medicine

## 2013-10-14 NOTE — Telephone Encounter (Signed)
Duplicate request

## 2013-10-19 ENCOUNTER — Encounter: Payer: Self-pay | Admitting: Pulmonary Disease

## 2013-10-19 NOTE — Progress Notes (Signed)
Subjective:    Patient ID: [name], female    DOB: 01-15-1941, 73 y.o.   MRN: [id number]  HPI 73 y/o WF here for a follow up visit... [name] has 13 grandchildren & 58 great-grands!  She also has mult med problems including severe COPD; HBP; CAD w/prev MI & PTCA followed by [name]; VI; Hyperlipidemia; DM on insulin; Obesity; GERD/ constipation; DJD/ osteoporosis; Anxiety...  SEE PREV EPIC NOTES FOR OLDER DATA >>   ~  February 26, 2012:  24moROV & [name] is again c/o nasal congestion, thick phlegm in throat, cough w/ clear sput, on meds below; she had CT maxillofacial 9/13 w/ no signif sinus opacity noted, there was a socket left by right mandib wisdom tooth extraction w/ inflamm (tooth extracted 6/13); treated w/ PenVK, Vicodin, rec ret to Dentist...    HxSinusitis> on MMW, Flonase, OTC antihist prn; c.o chr congestion, no recent infection...    COPD> on O2, NEBS w/ Duoneb/ Budes, Mucinex2Bid; stable & denies ch in cough/ sput/ SOB, etc...    HBP> on Aten50, Losar50, Lasix40; labs 8/13 w/ normal lytes & renal; BP= 126/70 & she denies CP, palpit, ch in SOB, edema, etc...    CAD> on ASA81, NTG prn; followed by [name]- seen 10/13 & stable, no changes made...    VI> stable on low sodium diet, Lasix40, elevation, support hose...    Hyperlipid> on Simva80; last FLP 8/13 showed TChol 125, TG 200, HDL 39, LDL 46    DM> on 70/30 insulin 30Bid, Metform500Bid; doing well she says w/ BS=101 today at home; labs here 8/13 showed BS=157, A1c=7.8    Obesity> wt is 207# which is up 2# from last visit; we reviewed diet, exercise, wt reduction strategies...    GI- GERD, Constip> on Omep20; hx gastritis & prepyloric ulcers on EGD 3/12, rec to stay on the PPI Rx; last colon 2007 by [name] was neg...    DJD> on Lodine400Bid prn, Tramadol50Tid prn, folowed by [facility]...    Osteoporosis> on Boniva150, clacium, MVI, VitD1000; labs 8/13 showed VitD=38; last BMD 5/13 w/ TScore -1.6 in hips...    Neuropathy> on  Neurontin100Tid...    Anxiety> on Xanax0.568mprn...    Borderline Anemia> on Fe, B12 100068md; labs 8/13 showed Hg=11.6, MCV=93, Fe=59 (18%sat),  We reviewed prob list, meds, xrays and labs> see below for updates >>   ~  Jul 16, 2012:  4-72mo772mo & post hosp check> [name] was eval recently by [facility] for CP & an abn Myoview- Cath 5/14 revealed mod diffuse LAD disease w/ 50%proxLAD stenosis w/ a 60-70% in-stent restenosis in the midLAD; RCA was small w/ a 75% prox stenosis; LCx was dominant w/o focal stenosis but w/ mod diffuse calcium; HK of inferoapical wall w/ EF= 35-40% and mild-mod MR (Rec was for continued medical management)... We reviewed the following medical problems during today's office visit >>     HxSinusitis> on MMW, Flonase, OTC antihist prn; c/o chr congestion, no recent infection...    COPD> on O2, NEBS w/ Duoneb/ Budes, Mucinex2Bid; stable w/o exac & denies ch in cough/ sput/ SOB, etc...    HBP> on Aten50, Losar50, Lasix40; labs 5/14 w/ normal lytes & renal; BP= 126/82 & she denies CP, palpit, ch in SOB, edema, etc...    CAD> on ASA81, NTG prn; followed by [name]- seen 5/14 w/ abn Myoview & subseq Cath- AS ABOVE, med rx...    VI> stable on low sodium diet, Lasix40, elevation, support hose...Marland KitchenMarland Kitchen  Hyperlipid> on Simva80; FLP 5/14 shows TChol 134, TG 227, HDL 33, LDL 74... Advised better low fat diet, exercise, & get wt down...    DM> on 70/30 insulin 30Bid, Metform500Bid; doing well she says; labs today showed BS=160, A1c=7.3 (sl improved- continue same + better diet)...    Obesity> wt is 201# which is down 6# from last visit; we reviewed diet, exercise, wt reduction strategies...    GI- GERD, Constip> on Omep20 & Probiotic; hx gastritis & prepyloric ulcers on EGD 3/12, rec to stay on the PPI Rx; last colon 2007 by [name] was neg...    DJD> on Lodine400Bid prn, Tramadol50Tid prn, folowed by [facility]...    Osteoporosis> on Boniva150, calcium, MVI, VitD1000; labs 8/13 showed VitD=38;  last BMD 5/13 w/ TScore -1.6 in hips...    Neuropathy> on Neurontin300Qhs...    Anxiety> on Xanax0.58m prn...    Hx Borderline Anemia> on Fe, B12 10032m/d; labs 5/14 showed Hg=13.2, MCV=90, last Fe was 8/13 = 59 (18%sat),  We reviewed prob list, meds, xrays and labs> see below for updates >>   LABS 5/14:  FLP- not at goals w/ TG=227, HDL=33;  Chems- ok x BS=160, A1c=7.3;  CBC- wnl;  TSH=2.09...  ~  November 14, 2012:  83m422moV & [name] is added-on for URI w/ cough, thick yellow sput, feeling poorly, SOB, wheezing, etc;  We called in Augmentin & rec nasal saline- min improvement but persist symptoms;  Exam shows decr BS 7 end exp wheezing/ rhonchi;  She has underlying COPD, ex-smoker, hx sinusitis, on NEBS, Mucinex;  Rec- Levaquin x10d, Depo80, Dosepak, incr Mucinex to 2Bid + fluids...     BP remains controlled on Aten, Losar, Lasix & measures 134/82;  She denies CP, palpit, edema;  Chol has been stable on Simva80 & she doesn't want to change;  DM controlled w/ 70/30 insulin on 30uBid, hasn't been able to lose weight stating "I can't starve myself"    We reviewed prob list, meds, xrays and labs> see below for updates >>   ~  February 02, 2013:  2-22mo122mo & [name] has recovered from her bronchitis exac & now back to baseline; she is concerned about her memory & notes that she is under a lot of stress...    Breathing is now stable on her NEBS w/ Albut/ Ipratrop/ Budes, Mucinex, Flonase, MMW...    BP is controlled on Aten50, Losar50, Lasix40 & measures 140/76 today... She denies CP, palpit, ch in SOB, etc on her ASA8[place]    Chol is regulated w/ Simva40 Qhs- last FLP 5/14 showed sl elev TG & she is reminded to follow a strict low fat diet 7 work on weight reduction...    DM control is fair on her Metform500Bid and 70/30 insulin taking 30u Bid; Labs today showed BS=203, A1c= 7.6 & we rec that she incr the insulin to 35u Bid...    She remains on Prilosec20, [id number], Tramadol, Alprazolam  as well...  We reviewed prob list, meds, xrays and labs> see below for updates >> she refuses the 2014 Flu vaccine...  LABS 12/14:  chems- ok x BS=203, A1c=7.6 on her Metform500Bid & 70/30 insulin 30uBid=> Rec to incr to 35uBid + diet & exercise...  ~  August 03, 2013:  19mo 70mo& Aimar has established Primary Care w/ [facility] had right T10 Shingles 5/15- resolving;  Here for pulm follow up of her severe COPD >>  HxSinusitis> on MMW, Flonase, OTC antihist prn; c/o chr congestion, no recent infection...    COPD (GOLD Stage4)> on O2 ([facility] system- 2L/min continuously), NEBS w/ DuonebQid/ BudesBid, Mucinex2Bid; stable w/o exac but at baseline has cough, thick beige sput, no hemoptysis, & chr dyspnea- ok w/ ADLs in general but SOB w/ chores/ housework/ walking in the house, etc...  We reviewed prob list, meds, xrays (3/15 CXR) and labs (3/15)> see below for updates >> REC to continue O2, NEBS, Mucinex & call promptly for any infectious exac etc...  CXR 3/15 showed cardiomeg, COPD/ atelectasis in lingula & LLL, DJD in Tspine & shoulders, NAD...  PFT 6/15 showed FVC=0.81 (28%), FEV1=0.43 (20%), %1sec=53%, mid-flows= 9% predicted... c/w GOLD Stage4 COPD.          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 Rx'd w/ MUCINEX, Nasal Saline, FLONASE...   ~  2/11:  c/o sinusitis & AUGMENTIN perscribed. ~  9/13:  ER visit w/ jaw pain- had wisdom tooth extracted 6/13 by DrLewis in [place]; CT showed socket left by right mandib molar extraction w/ inflamm; given PenVK, Vicodin, told f/u w/ dentist...  COPD (ICD-496) - she has COPD and is an ex-smoker... controlled on NEBULIZER w/ Duoneb Qid + Budesonide Bid, MUCINEX, etc... she has end stage disease w/ severe dyspnea w/ ADLs and no change over the last yr... ~  baseline CXR = COPD, incr heart size, NAD. ~ CT Chest 2/02 showed COPD, Atx, sl scarring, NAD. ~   bronchoscopy 3/02 w/ mucous plugging, no endobr lesions. ~  PFT's in 12/07 showed FVC=0.81 (27%), FEV1=0.54 (22%), %1sec=67, mid-flows=13%pred. ~  CXR 1/10 showed chr changes, cardiomeg, lingular atx. ~  CXR 2/11 showed cardiomeg, bibasilar scarring, NAD. ~  CXR 2/12 showed cardiomeg, COPD,scarring left base, NAD. ~  CXR 5/14 showed cardiomeg, mild basilar atx/ flattened diaph/ NAD, DJD in Tspine, s/p chole... ~  9/14:  Presents w/ URI & AB exac- unresponsive to Augmentin called in, & rec Levaquin, Depo, Dosepak, Mucinex, Fluids, etc... ~  CXR 3/15 showed cardiomeg, COPD/ atelectasis in lingula & LLL, DJD in Tspine & shoulders, NAD... ~  6/15: on O2 ([facility]- 2L/min continuously), NEBS w/ DuonebQid/ BudesBid, Mucinex2Bid; stable w/o exac but at baseline has cough, thick beige sput, no hemoptysis, & chr dyspnea- ok w/ ADLs in general but SOB w/ chores/ housework/ walking in the house, etc...  ~  PFT 6/15 showed FVC=0.81 (28%), FEV1=0.43 (20%), %1sec=53%, mid-flows= 9% predicted... c/w GOLD Stage4 COPD   SHE HAS ESTABLISHED PRIMARY CARE AT [facility] >>   HYPERTENSION (ICD-401.9) - on ATENOLOL 27m/d, LOSARTAN 557md, LASIX 4070m... BP=126/80 and tol well... denies HA, visual changes, CP, palipit, dizziness, syncope, change in dyspnea, edema, etc.  CORONARY ARTERY DISEASE (ICD-414.00) - on ASA 75m77m& Imdur30... hx of CAD w/ prev MI in 2000, and subseq PTCA's in 2000, & 2001...  VENOUS INSUFFICIENCY (ICD-459.81) - low sodium diet, elevation, & Lasix 40-80mg75m. states she "allergic" to TED hose! and refuses to wear them...   HYPERLIPIDEMIA (ICD-272.4) - on ZOCOR 80mg/57mFISH OIL 1000mg/d25m she Marland Kitchenas been counselled on diet & exercise but unsuccessful...  DM (ICD-250.00) - on HUMULIN 70/30- now taking 30uAM & 30uPM she says...  OBESITY (ICD-278.00) - weight = 206# (BMI=35); we discussed diet + exercise program required to lose weight!  GERD (ICD-530.81) - on PPI therapy  (changed 2/10 per request to OMEPRAZOLE20mg/d)55m~  Hosp [facility]  for COPD exac & was anemic w/ heme pos stool & GIB; EGD by [name] w/ gastritis & prepyloric ulcers, +HPylori- treated & improved.  CONSTIPATION (ICD-564.00) - last colonoscopy was 11/07 by [name] and negative...  DEGENERATIVE JOINT DISEASE (ICD-715.90) - on Vicodin, Trmaadol, Neurontin; prev CSpine films w/ spondy and biforaminal narrowing... LSSpine films w/ DDD and osteopenia...   OSTEOPOROSIS (ICD-733.00) - on BONIVA 1100m/mo, Calcium, Vits...   ANXIETY (ICD-300.00) - uses Alprazolam 0.59mas needed...  Hx ANEMIA, MILD (ICD-285.9)  Health Maintenance - she hasn't seen GYN in yrs and is asked to re-establish... prev refused Mammograms due to the discomfort & bruising- she will discuss w/ Gyn & get a follow up BMD as well...   Past Surgical History  Procedure Laterality Date  . Total abdominal hysterectomy      DUB (non-malignant reasons).  No hx of abnormal pap smears.  . Cholecystectomy    . Rotator cuff repair Right   . Cataract extraction w/phaco Left 06/30/2012    Procedure: CATARACT EXTRACTION PHACO AND INTRAOCULAR LENS PLACEMENT (IOC);  Surgeon: KeTonny BranchMD;  Location: [facility];  Service: Ophthalmology;  Laterality: Left;  CDE:12.32  . Coronary artery bypass graft      Outpatient Encounter Prescriptions as of 08/03/2013  Medication Sig  . albuterol (PROVENTIL HFA;VENTOLIN HFA) 108 (90 BASE) MCG/ACT inhaler Inhale 2 puffs into the lungs every 6 (six) hours as needed for wheezing or shortness of breath.  . Marland Kitchenlbuterol (PROVENTIL) (2.5 MG/3ML) 0.083% nebulizer solution Take 3 mLs (2.5 mg total) by nebulization 4 (four) times daily.  . Marland Kitchenspirin 81 MG tablet Take 81 mg by mouth daily.  . budesonide (PULMICORT) 0.25 MG/2ML nebulizer solution Take 2 mLs (0.25 mg total) by nebulization 2 (two) times daily.  . calcium-vitamin D (OSCAL WITH D) 500-200 MG-UNIT per tablet Take 1 tablet by mouth 2 (two) times daily.    .  Cholecalciferol (VITAMIN D3) 1000 UNITS CAPS Take 1 capsule by mouth daily.   . Marland Kitchenextromethorphan-guaiFENesin (MUCINEX DM) 30-600 MG per 12 hr tablet Take 2 tablets by mouth every 12 (twelve) hours.    . Marland Kitchentodolac (LODINE) 400 MG tablet TAKE ONE TABLET BY MOUTH EVERY 12 HOURS  . ferrous sulfate 325 (65 FE) MG tablet Take 325 mg by mouth daily with breakfast.   . furosemide (LASIX) 40 MG tablet TAKE ONE TABLET BY MOUTH ONCE DAILY  . glucose blood (ONE TOUCH ULTRA TEST) test strip TEST BLOOD SUGAR LEVELS THREE TIMES DAILY  . ipratropium (ATROVENT) 0.02 % nebulizer solution Take 2.5 mLs (500 mcg total) by nebulization 4 (four) times daily.  . isosorbide mononitrate (IMDUR) 30 MG 24 hr tablet Take 1 tablet (30 mg total) by mouth daily.  . nitroGLYCERIN (NITROSTAT) 0.4 MG SL tablet Place 1 tablet (0.4 mg total) under the tongue every 5 (five) minutes as needed. May repeat x3  . omeprazole (PRILOSEC) 20 MG capsule TAKE ONE CAPSULE BY MOUTH ONCE DAILY 30  MINUTES  BEFORE  A  MEAL  . ONE TOUCH LANCETS MISC Check blood sugars two times daily  . Probiotic Product (PROBIOTIC DAILY PO) Take 1 tablet by mouth daily.   . vitamin B-12 (CYANOCOBALAMIN) 500 MCG tablet Take 500 mcg by mouth daily.   . vitamin C (ASCORBIC ACID) 500 MG tablet Take 500 mg by mouth daily.  . [DISCONTINUED] acetaminophen (TYLENOL) 500 MG tablet Take 1,000 mg by mouth every 8 (eight) hours. pain  . [DISCONTINUED] ALPRAZolam (XANAX) 0.5 MG tablet TAKE ONE -HALF TO ONE  TABLET BY MOUTH THREE TIMES DAILY AS NEEDED  . [DISCONTINUED] atenolol (TENORMIN) 50 MG tablet TAKE ONE TABLET BY MOUTH ONCE DAILY  . [DISCONTINUED] fluticasone (FLONASE) 50 MCG/ACT nasal spray USE TWO SPRAY IN EACH NOSTRIL ONCE DAILY  . [DISCONTINUED] gabapentin (NEURONTIN) 300 MG capsule TAKE ONE CAPSULE BY MOUTH THREE TIMES DAILY  . [DISCONTINUED] HUMULIN 70/30 (70-30) 100 UNIT/ML injection INJECT 30 UNITS IN THE MORNING AND 30 UNITS IN THE EVENING  . [DISCONTINUED]  HYDROcodone-acetaminophen (NORCO/VICODIN) 5-325 MG per tablet Take 1 tablet by mouth every 12 (twelve) hours.  . [DISCONTINUED] ibandronate (BONIVA) 150 MG tablet TAKE ONE TABLET BY MOUTH ONCE A MONTH  . [DISCONTINUED] insulin NPH-regular Human (NOVOLIN 70/30) (70-30) 100 UNIT/ML injection INJECT 25UNITS IN THE MORNING AND 25  UNITS IN THE EVENING.  . [DISCONTINUED] losartan (COZAAR) 50 MG tablet TAKE ONE TABLET BY MOUTH ONCE DAILY  . [DISCONTINUED] methocarbamol (ROBAXIN) 500 MG tablet Take 1 tablet (500 mg total) by mouth 2 (two) times daily as needed for muscle spasms.  . [DISCONTINUED] PROLENSA 0.07 % SOLN Place 1 drop into both eyes 4 (four) times daily.  . [DISCONTINUED] RELION INSULIN SYR 0.5CC/30G 30G X 5/16" 0.5 ML MISC USE TO TEST BLOOD SUGAR LEVELS THREE TIMES DAILY  . [DISCONTINUED] simvastatin (ZOCOR) 80 MG tablet TAKE ONE TABLET BY MOUTH EVERY DAY AT BEDTIME  . [DISCONTINUED] traMADol (ULTRAM) 50 MG tablet TAKE ONE TABLET BY MOUTH EVERY 8 HOURS AS NEEDED FOR PAIN  . [DISCONTINUED] valACYclovir (VALTREX) 1000 MG tablet Take 1 tablet (1,000 mg total) by mouth 3 (three) times daily.     Allergies  Allergen Reactions  . Codeine Nausea And Vomiting    vomiting  . Terbutaline Sulfate     REACTION: INTOL to terbutaline    Current Medications, Allergies, Past Medical History, Past Surgical History, Family History, and Social History were reviewed in [employer] record.    Review of Systems         See HPI - all other systems neg except as noted...   The patient complains of dyspnea on exertion, peripheral edema, muscle weakness, and difficulty walking.  The patient denies anorexia, fever, weight loss, weight gain, vision loss, decreased hearing, hoarseness, chest pain, syncope, prolonged cough, headaches, hemoptysis, abdominal pain, melena, hematochezia, severe indigestion/heartburn, hematuria, incontinence, suspicious skin lesions, transient blindness,  depression, unusual weight change, abnormal bleeding, enlarged lymph nodes, and angioedema.    Objective:   Physical Exam    WD, Obese, 73 y/o WF in NAD... GENERAL:  Alert & oriented; pleasant & cooperative...  HEENT:  [place]/AT, EOM-wnl, PERRLA, EACs-clear, TMs-wnl, NOSE-clear, THROAT-clear & wnl. NECK:  Supple w/ fairROM; no JVD; normal carotid impulses w/o bruits; no thyromegaly or nodules palpated; no lymphadenopathy. CHEST:  Decr BS bilat, few scat rhonchi, without wheezing, rales, or rubs...upper airway psuedo-wheezing HEART:  Regular Rhythm;  gr 1/6 SEM without rubs or gallops detected... ABDOMEN:  Obese w/ panniculus, soft & nontender; normal bowel sounds; no organomegaly or masses detected. EXT: without deformities, mild arthritic changes; no varicose veins/ +venous insuffic/ tr edema. Sl tender on palpation over lumbar area & siatic notch... NEURO:  CN's intact; no focal neuro deficits... DERM:  no lesion seen...  RADIOLOGY DATA:  Reviewed in the EPIC EMR & discussed w/ the patient...  LABORATORY DATA:  Reviewed in the EPIC EMR & discussed w/ the patient...   Assessment & Plan:    COPD>  continues home Oxygen, Nebulizer, Mucinex, & Fluids;  Prev exac treated  w/ Levaquin, Depo80, Dosepak, & resolved...  HBP>  Good control on Atenolol50, Losartan50, Furosemide40; discussed diet, must get wt down, no sodium...  CAD>  Followed by [name] in [place] office- recent cath reviewed, no option for surg or intervention, continue medical management...  HYPERLIPID>  On Simva80 + Fish Oil; needs better diet & get wt down!  DM>  Control remains fair as she has been unable to diet effectively, lose weight, etc; now on 70/30 insulin taking 30u Bid & A1c is 7.6 therefore rec incr to 35u Bid...  OBESITY>  Weight reduction is key to improvement in all areas...  GI> GERD, Ulcers, +HPylori, Constip>  Improved after HPylori Rx & back on Omep 72m/d...  DJD, LBP, etc>  Lumbar XRay  reviewed, trial Tramadol, Robaxin, rest/ heat/ etc... Refer to Ortho for further eval & Rx...  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  Anxiety>  Alprazolam for prn use- refilled...  Anemia>  On Fe daily and Hg is improved (s/p hosp w/ GIB)...   Patient's Medications  New Prescriptions   HYDROCODONE-ACETAMINOPHEN (NORCO/VICODIN) 5-325 MG PER TABLET    Take 1 tablet by mouth every 6 (six) hours as needed for moderate pain.   PIOGLITAZONE (ACTOS) 15 MG TABLET    Take 1 tablet (15 mg total) by mouth daily.  Previous Medications   ALBUTEROL (PROVENTIL HFA;VENTOLIN HFA) 108 (90 BASE) MCG/ACT INHALER    Inhale 2 puffs into the lungs every 6 (six) hours as needed for wheezing or shortness of breath.   ALBUTEROL (PROVENTIL) (2.5 MG/3ML) 0.083% NEBULIZER SOLUTION    Take 3 mLs (2.5 mg total) by nebulization 4 (four) times daily.   ASPIRIN 81 MG TABLET    Take 81 mg by mouth daily.   BUDESONIDE (PULMICORT) 0.25 MG/2ML NEBULIZER SOLUTION    Take 2 mLs (0.25 mg total) by nebulization 2 (two) times daily.   CALCIUM-VITAMIN D (OSCAL WITH D) 500-200 MG-UNIT PER TABLET    Take 1 tablet by mouth 2 (two) times daily.     CHOLECALCIFEROL (VITAMIN D3) 1000 UNITS CAPS    Take 1 capsule by mouth daily.    DEXTROMETHORPHAN-GUAIFENESIN (MUCINEX DM) 30-600 MG PER 12 HR TABLET    Take 2 tablets by mouth every 12 (twelve) hours.     ETODOLAC (LODINE) 400 MG TABLET    TAKE ONE TABLET BY MOUTH EVERY 12 HOURS   FERROUS SULFATE 325 (65 FE) MG TABLET    Take 325 mg by mouth daily with breakfast.    FUROSEMIDE (LASIX) 40 MG TABLET    TAKE ONE TABLET BY MOUTH ONCE DAILY   GLUCOSE BLOOD (ONE TOUCH ULTRA TEST) TEST STRIP    TEST BLOOD SUGAR LEVELS THREE TIMES DAILY   INSULIN NPH-REGULAR HUMAN (NOVOLIN 70/30) (70-30) 100 UNIT/ML INJECTION    Inject 30 Units into the skin 2 (two) times daily with a meal.    IPRATROPIUM (ATROVENT) 0.02 % NEBULIZER SOLUTION    Take 2.5 mLs (500 mcg total)  by nebulization 4 (four) times daily.   ISOSORBIDE MONONITRATE (IMDUR) 30 MG 24 HR TABLET    Take 1 tablet (30 mg total) by mouth daily.   NITROGLYCERIN (NITROSTAT) 0.4 MG SL TABLET    Place 1 tablet (0.4 mg total) under the tongue every 5 (five) minutes as needed. May repeat x3   OMEPRAZOLE (PRILOSEC) 20 MG CAPSULE    TAKE ONE CAPSULE BY MOUTH ONCE DAILY 30  MINUTES  BEFORE  A  MEAL   ONE TOUCH LANCETS MISC    Check blood sugars two times daily   PROBIOTIC PRODUCT (PROBIOTIC DAILY PO)    Take 1 tablet by mouth daily.    VITAMIN B-12 (CYANOCOBALAMIN) 500 MCG TABLET    Take 500 mcg by mouth daily.    VITAMIN C (ASCORBIC ACID) 500 MG TABLET    Take 500 mg by mouth daily.  Modified Medications   Modified Medication Previous Medication   ALPRAZOLAM (XANAX) 0.5 MG TABLET ALPRAZolam (XANAX) 0.5 MG tablet      TAKE ONE -HALF TO ONE TABLET BY MOUTH THREE TIMES DAILY AS NEEDED    TAKE ONE -HALF TO ONE TABLET BY MOUTH THREE TIMES DAILY AS NEEDED   ATENOLOL (TENORMIN) 50 MG TABLET atenolol (TENORMIN) 50 MG tablet      TAKE ONE TABLET BY MOUTH ONCE DAILY    TAKE ONE TABLET BY MOUTH ONCE DAILY   FLUTICASONE (FLONASE) 50 MCG/ACT NASAL SPRAY fluticasone (FLONASE) 50 MCG/ACT nasal spray      USE TWO SPRAY IN EACH NOSTRIL ONCE DAILY    USE TWO SPRAY IN EACH NOSTRIL ONCE DAILY   GABAPENTIN (NEURONTIN) 300 MG CAPSULE gabapentin (NEURONTIN) 300 MG capsule      TAKE ONE CAPSULE BY MOUTH THREE TIMES DAILY    TAKE ONE CAPSULE BY MOUTH THREE TIMES DAILY   IBANDRONATE (BONIVA) 150 MG TABLET ibandronate (BONIVA) 150 MG tablet      TAKE ONE TABLET BY MOUTH ONCE A MONTH    TAKE ONE TABLET BY MOUTH ONCE A MONTH   INSULIN SYRINGE-NEEDLE U-100 (RELION INSULIN SYR 0.5CC/30G) 30G X 5/16" 0.5 ML MISC RELION INSULIN SYR 0.5CC/30G 30G X 5/16" 0.5 ML MISC      USE TO TEST BLOOD SUGAR LEVELS THREE TIMES DAILY    USE TO TEST BLOOD SUGAR LEVELS THREE TIMES DAILY   LOSARTAN (COZAAR) 50 MG TABLET losartan (COZAAR) 50 MG tablet       TAKE ONE TABLET BY MOUTH ONCE DAILY    TAKE ONE TABLET BY MOUTH ONCE DAILY   SIMVASTATIN (ZOCOR) 80 MG TABLET simvastatin (ZOCOR) 80 MG tablet      TAKE ONE TABLET BY MOUTH EVERY DAY AT BEDTIME    TAKE ONE TABLET BY MOUTH EVERY DAY AT BEDTIME   TRAMADOL (ULTRAM) 50 MG TABLET traMADol (ULTRAM) 50 MG tablet      TAKE ONE TABLET BY MOUTH EVERY 8 HOURS AS NEEDED FOR PAIN    TAKE ONE TABLET BY MOUTH EVERY 8 HOURS AS NEEDED FOR PAIN  Discontinued Medications   ACETAMINOPHEN (TYLENOL) 500 MG TABLET    Take 1,000 mg by mouth every 8 (eight) hours. pain   ALPRAZOLAM (XANAX) 0.5 MG TABLET    TAKE ONE -HALF TO ONE TABLET BY MOUTH THREE TIMES DAILY AS NEEDED   HUMULIN 70/30 (70-30) 100 UNIT/ML INJECTION    INJECT 30 UNITS IN THE MORNING AND 30 UNITS IN THE EVENING   HYDROCODONE-ACETAMINOPHEN (NORCO/VICODIN) 5-325 MG PER TABLET    Take 1 tablet by mouth every 12 (twelve) hours.   INSULIN NPH-REGULAR HUMAN (NOVOLIN 70/30) (70-30) 100 UNIT/ML INJECTION    INJECT 25UNITS IN THE MORNING AND 25  UNITS IN THE EVENING.   METHOCARBAMOL (ROBAXIN) 500 MG TABLET    Take 1 tablet (500 mg total) by mouth 2 (two) times daily as needed for muscle spasms.   PROLENSA 0.07 % SOLN    Place 1 drop into both eyes 4 (four) times daily.  SIMVASTATIN (ZOCOR) 80 MG TABLET    TAKE ONE TABLET BY MOUTH EVERY DAY AT BEDTIME   VALACYCLOVIR (VALTREX) 1000 MG TABLET    Take 1 tablet (1,000 mg total) by mouth 3 (three) times daily.

## 2013-11-04 ENCOUNTER — Other Ambulatory Visit: Payer: Self-pay | Admitting: Pulmonary Disease

## 2013-11-04 ENCOUNTER — Other Ambulatory Visit: Payer: Self-pay | Admitting: Family Medicine

## 2013-11-04 MED ORDER — FUROSEMIDE 40 MG PO TABS: ORAL_TABLET | ORAL | Status: DC

## 2013-11-04 MED ORDER — OMEPRAZOLE 20 MG PO CPDR: DELAYED_RELEASE_CAPSULE | ORAL | Status: DC

## 2013-11-17 ENCOUNTER — Ambulatory Visit: Payer: Medicare Other | Admitting: Family Medicine

## 2013-12-21 ENCOUNTER — Ambulatory Visit (INDEPENDENT_AMBULATORY_CARE_PROVIDER_SITE_OTHER): Payer: Medicare Other | Admitting: Family Medicine

## 2013-12-21 ENCOUNTER — Encounter: Payer: Self-pay | Admitting: Family Medicine

## 2013-12-21 VITALS — BP 180/75 | HR 75 | Temp 98.2°F | Resp 18 | Ht 64.5 in | Wt 216.0 lb

## 2013-12-21 DIAGNOSIS — E1165 Type 2 diabetes mellitus with hyperglycemia: Secondary | ICD-10-CM

## 2013-12-21 DIAGNOSIS — J441 Chronic obstructive pulmonary disease with (acute) exacerbation: Secondary | ICD-10-CM

## 2013-12-21 DIAGNOSIS — G5791 Unspecified mononeuropathy of right lower limb: Secondary | ICD-10-CM

## 2013-12-21 DIAGNOSIS — IMO0002 Reserved for concepts with insufficient information to code with codable children: Secondary | ICD-10-CM

## 2013-12-21 DIAGNOSIS — G5792 Unspecified mononeuropathy of left lower limb: Secondary | ICD-10-CM

## 2013-12-21 DIAGNOSIS — I2510 Atherosclerotic heart disease of native coronary artery without angina pectoris: Secondary | ICD-10-CM

## 2013-12-21 DIAGNOSIS — N183 Chronic kidney disease, stage 3 unspecified: Secondary | ICD-10-CM

## 2013-12-21 DIAGNOSIS — G5793 Unspecified mononeuropathy of bilateral lower limbs: Secondary | ICD-10-CM

## 2013-12-21 LAB — BASIC METABOLIC PANEL
BUN: 16 mg/dL (ref 6–23)
CO2: 37 mEq/L — ABNORMAL HIGH (ref 19–32)
Calcium: 9.4 mg/dL (ref 8.4–10.5)
Chloride: 98 mEq/L (ref 96–112)
Creatinine, Ser: 1.3 mg/dL — ABNORMAL HIGH (ref 0.4–1.2)
GFR: 41.17 mL/min — AB (ref >60.00)
Glucose, Bld: 156 mg/dL — ABNORMAL HIGH (ref 70–99)
POTASSIUM: 4.3 meq/L (ref 3.5–5.1)
Sodium: 141 mEq/L (ref 135–145)

## 2013-12-21 LAB — HEMOGLOBIN A1C: Hgb A1c MFr Bld: 7.9 % — ABNORMAL HIGH (ref 4.6–6.5)

## 2013-12-21 MED ORDER — AZITHROMYCIN 250 MG PO TABS: ORAL_TABLET | ORAL | Status: DC

## 2013-12-21 MED ORDER — GABAPENTIN 300 MG PO CAPS: ORAL_CAPSULE | ORAL | Status: DC

## 2013-12-21 MED ORDER — PREDNISONE 20 MG PO TABS: ORAL_TABLET | ORAL | Status: DC

## 2013-12-21 NOTE — Patient Instructions (Addendum)
You may use OTC generic robitussin DM for cough as directed on the packaging.  Decrease your morning 70/30 insulin dose to 36 Units.  Continue 32 Units of 70/30 at supper.

## 2013-12-21 NOTE — Progress Notes (Signed)
OFFICE NOTE  12/21/2013  CC:  Chief Complaint  Patient presents with  . Follow-up    wants refill on gabapentin   HPI: Patient is a 73 y.o. Caucasian female who is here for 4 mo f/u DM 2, COPD, bilat lower leg chronic neuropathic pain. Son died recently, currently grieving. Says she has had URI with PND, lots of coughing for at least 2 wks, not improving.  No fevers. More DOE than her usual.  Not trying any OTC cough med.  Says she has been "working on" her diabetes.  Currently she takes 70/30 insulin 38 U qAM and 32 U qPM. Review of glucoses show gradual improvement over the last few months in mornings and evenings, but still some erratic numbers (some <70 and some >300).  Reports compliance with diet and meds.  Both ankls and feet with neuropathic pain, worse recently since being off neurontin (I had told her to stop taking this when I was treating her with narcotics when she had shingles several months ago.  Pertinent PMH:  Past Medical History  Diagnosis Date  . COPD (chronic obstructive pulmonary disease)     Oxygen prn ambulation  . Hyperlipidemia   . Essential hypertension, benign   . Anemia   . Anxiety   . Arthritis     L-spine, hips, knees, elbows, wrists, hands  . Coronary atherosclerosis of native coronary artery     PTCA/BMS LAD 2000, PTCA/brachytherapy LAD 2001, LVEF 55%.  CABG  . Type 2 diabetes mellitus   . Osteoporosis   . Venous insufficiency   . Obesity   . Myocardial infarction     AMI 2000  . Asthma   . Sleep apnea     Not on CPAP  . DDD (degenerative disc disease), lumbar   . Herpes zoster 06/2013   Past Surgical History  Procedure Laterality Date  . Total abdominal hysterectomy      DUB (non-malignant reasons).  No hx of abnormal pap smears.  . Cholecystectomy    . Rotator cuff repair Right   . Cataract extraction w/phaco Left 06/30/2012    Procedure: CATARACT EXTRACTION PHACO AND INTRAOCULAR LENS PLACEMENT (IOC);  Surgeon: Gemma PayorKerry Hunt, MD;   Location: [facility];  Service: Ophthalmology;  Laterality: Left;  CDE:12.32  . Coronary artery bypass graft      MEDS:  Outpatient Prescriptions Prior to Visit  Medication Sig Dispense Refill  . albuterol (PROVENTIL HFA;VENTOLIN HFA) 108 (90 BASE) MCG/ACT inhaler Inhale 2 puffs into the lungs every 6 (six) hours as needed for wheezing or shortness of breath.  1 Inhaler  6  . albuterol (PROVENTIL) (2.5 MG/3ML) 0.083% nebulizer solution Take 3 mLs (2.5 mg total) by nebulization 4 (four) times daily.  75 mL  11  . ALPRAZolam (XANAX) 0.5 MG tablet TAKE ONE -HALF TO ONE TABLET BY MOUTH THREE TIMES DAILY AS NEEDED  90 tablet  2  . aspirin 81 MG tablet Take 81 mg by mouth daily.      Marland Kitchen. atenolol (TENORMIN) 50 MG tablet TAKE ONE TABLET BY MOUTH ONCE DAILY  30 tablet  3  . budesonide (PULMICORT) 0.25 MG/2ML nebulizer solution Take 2 mLs (0.25 mg total) by nebulization 2 (two) times daily.  60 mL  11  . calcium-vitamin D (OSCAL WITH D) 500-200 MG-UNIT per tablet Take 1 tablet by mouth 2 (two) times daily.        . Cholecalciferol (VITAMIN D3) 1000 UNITS CAPS Take 1 capsule by mouth daily.       .Marland Kitchen  dextromethorphan-guaiFENesin (MUCINEX DM) 30-600 MG per 12 hr tablet Take 2 tablets by mouth every 12 (twelve) hours.        Marland Kitchen etodolac (LODINE) 400 MG tablet TAKE ONE TABLET BY MOUTH EVERY 12 HOURS  60 tablet  3  . ferrous sulfate 325 (65 FE) MG tablet Take 325 mg by mouth daily with breakfast.       . fluticasone (FLONASE) 50 MCG/ACT nasal spray USE TWO SPRAY IN EACH NOSTRIL ONCE DAILY  16 g  3  . furosemide (LASIX) 40 MG tablet TAKE ONE TABLET BY MOUTH ONCE DAILY  30 tablet  3  . gabapentin (NEURONTIN) 300 MG capsule TAKE ONE CAPSULE BY MOUTH THREE TIMES DAILY  90 capsule  0  . glucose blood (ONE TOUCH ULTRA TEST) test strip TEST BLOOD SUGAR LEVELS THREE TIMES DAILY  100 each  5  . HYDROcodone-acetaminophen (NORCO/VICODIN) 5-325 MG per tablet Take 1 tablet by mouth every 6 (six) hours as needed for moderate pain.   15 tablet  0  . ibandronate (BONIVA) 150 MG tablet TAKE ONE TABLET BY MOUTH ONCE A MONTH  1 tablet  3  . insulin NPH-regular Human (NOVOLIN 70/30) (70-30) 100 UNIT/ML injection Inject 30 Units into the skin 2 (two) times daily with a meal.       . Insulin Syringe-Needle U-100 (RELION INSULIN SYR 0.5CC/30G) 30G X 5/16" 0.5 ML MISC USE TO TEST BLOOD SUGAR LEVELS THREE TIMES DAILY  100 each  3  . ipratropium (ATROVENT) 0.02 % nebulizer solution Take 2.5 mLs (500 mcg total) by nebulization 4 (four) times daily.  75 mL  11  . isosorbide mononitrate (IMDUR) 30 MG 24 hr tablet Take 1 tablet (30 mg total) by mouth daily.  90 tablet  1  . losartan (COZAAR) 50 MG tablet TAKE ONE TABLET BY MOUTH ONCE DAILY  30 tablet  3  . nitroGLYCERIN (NITROSTAT) 0.4 MG SL tablet Place 1 tablet (0.4 mg total) under the tongue every 5 (five) minutes as needed. May repeat x3  25 tablet  6  . omeprazole (PRILOSEC) 20 MG capsule TAKE ONE CAPSULE BY MOUTH ONCE DAILY 30  MINUTES  BEFORE  A  MEAL  30 capsule  3  . ONE TOUCH LANCETS MISC Check blood sugars two times daily  100 each  6  . pioglitazone (ACTOS) 15 MG tablet Take 1 tablet (15 mg total) by mouth daily.  30 tablet  6  . Probiotic Product (PROBIOTIC DAILY PO) Take 1 tablet by mouth daily.       . simvastatin (ZOCOR) 80 MG tablet TAKE ONE TABLET BY MOUTH EVERY DAY AT BEDTIME  30 tablet  3  . traMADol (ULTRAM) 50 MG tablet TAKE ONE TABLET BY MOUTH EVERY 8 HOURS AS NEEDED FOR PAIN  90 tablet  2  . vitamin B-12 (CYANOCOBALAMIN) 500 MCG tablet Take 500 mcg by mouth daily.       . vitamin C (ASCORBIC ACID) 500 MG tablet Take 500 mg by mouth daily.       No facility-administered medications prior to visit.    PE: Blood pressure 180/75, pulse 75, temperature 98.2 F (36.8 C), temperature source Oral, resp. rate 18, height 5' 4.5" (1.638 m), weight 216 lb (97.977 kg), SpO2 92.00%. VS: noted--normal. Gen: alert, NAD, NONTOXIC APPEARING. HEENT: eyes without injection,  drainage, or swelling.  Ears: EACs clear, TMs with normal light reflex and landmarks.  Nose: Clear rhinorrhea, with some dried, crusty exudate adherent to mildly injected mucosa.  No purulent d/c.  Mild paranasal sinus TTP.  +Allergic shineres with symmetric infraorbital edema..  Throat and mouth without focal lesion.  No pharyngial swelling, erythema, or exudate.   Neck: supple, no LAD.   LUNGS: Clear on inspiration, tight wheezing with forced exhalation, nonlabored resps.   CV: RRR, no m/r/g. EXT: no clubbing or cyanosis.  Trace pitting edema in both ankles. SKIN: no rash  LAB: NONE TODAY Recent:   Lab Results  Component Value Date   HGBA1C 9.7* 09/21/2013     Chemistry      Component Value Date/Time   NA 137 06/02/2013 1245   K 4.0 06/02/2013 1245   CL 94* 06/02/2013 1245   CO2 30 06/02/2013 1245   BUN 13 06/02/2013 1245   CREATININE 1.05 06/02/2013 1245   CREATININE 1.25* 07/04/2012 1548      Component Value Date/Time   CALCIUM 9.6 06/02/2013 1245   ALKPHOS 55 06/02/2013 1245   AST 15 06/02/2013 1245   ALT 17 06/02/2013 1245   BILITOT 0.4 06/02/2013 1245     Lab Results  Component Value Date   CHOL 167 09/21/2013   HDL 36.20* 09/21/2013   LDLCALC 56 09/21/2013   LDLDIRECT 73.9 07/16/2012   TRIG 376.0* 09/21/2013   CHOLHDL 5 09/21/2013   Lab Results  Component Value Date   TSH 2.09 07/16/2012   Lab Results  Component Value Date   WBC 7.5 06/02/2013   HGB 13.9 06/02/2013   HCT 41.7 06/02/2013   MCV 91.0 06/02/2013   PLT 242 06/02/2013     IMPRESSION AND PLAN:  1) DM 2, not well controlled but improving some. Some lows, particularly in evening.  Will decrease morning 70/30 dose to 36 U. Check HbA1c today.  2) CRI, stage 3.  Check BMET today.  3) bilat LE chronic neuropathic pain: restart neurontin 300 mg qhs, titrate to 300 mg tid over the next 1 wk.  4) COPD exacerbation, with prolonged URI/acute sinusitis.  Prednisone 40mg  qd x 5d, then 20mg  qd x 5d, then 10mg  qd x  6d. Robitussin DM OTC for cough.  Continue q4-6h prn albut nebs. Continue oxygen support as per her usual.  Of note, pt declines all vaccines.  An After Visit Summary was printed and given to the patient.  FOLLOW UP: 3 wks

## 2013-12-21 NOTE — Progress Notes (Signed)
Pre visit review using our clinic review tool, if applicable. No additional management support is needed unless otherwise documented below in the visit note. 

## 2014-01-04 ENCOUNTER — Other Ambulatory Visit: Payer: Self-pay | Admitting: Family Medicine

## 2014-01-04 NOTE — Telephone Encounter (Signed)
Pt requesting rf of xanax.  Last OV was 12/21/13.  Last RX was 09/08/13 # 90 x 2 rfs.  Please advise.

## 2014-01-08 ENCOUNTER — Encounter: Payer: Self-pay | Admitting: Family Medicine

## 2014-01-08 ENCOUNTER — Ambulatory Visit (INDEPENDENT_AMBULATORY_CARE_PROVIDER_SITE_OTHER): Payer: Medicare Other | Admitting: Family Medicine

## 2014-01-08 VITALS — BP 123/70 | HR 54 | Temp 97.5°F | Resp 20 | Ht 64.5 in | Wt 221.0 lb

## 2014-01-08 DIAGNOSIS — E1140 Type 2 diabetes mellitus with diabetic neuropathy, unspecified: Secondary | ICD-10-CM

## 2014-01-08 DIAGNOSIS — I2510 Atherosclerotic heart disease of native coronary artery without angina pectoris: Secondary | ICD-10-CM

## 2014-01-08 DIAGNOSIS — E1149 Type 2 diabetes mellitus with other diabetic neurological complication: Secondary | ICD-10-CM

## 2014-01-08 DIAGNOSIS — J441 Chronic obstructive pulmonary disease with (acute) exacerbation: Secondary | ICD-10-CM

## 2014-01-08 MED ORDER — GLUCOSE BLOOD VI STRP: ORAL_STRIP | Status: AC

## 2014-01-08 NOTE — Progress Notes (Signed)
Pre visit review using our clinic review tool, if applicable. No additional management support is needed unless otherwise documented below in the visit note. 

## 2014-01-08 NOTE — Progress Notes (Signed)
OFFICE NOTE  01/08/2014  CC:  Chief Complaint  Patient presents with  . Follow-up     HPI: Patient is a 73 y.o. Caucasian female who is here for 3 wk f/u COPD exacerbation. Feeling much improved s/p prednisone taper and Z-pack-back to baseline.  Feet and ankle neuropathic pain much improved since getting back on gabapentin last visit. Describes some night-time burning type pain in feet--mild now.   Pertinent PMH:  Past medical, surgical, social, and family history reviewed and no changes are noted since last office visit.  MEDS:  Outpatient Prescriptions Prior to Visit  Medication Sig Dispense Refill  . albuterol (PROVENTIL HFA;VENTOLIN HFA) 108 (90 BASE) MCG/ACT inhaler Inhale 2 puffs into the lungs every 6 (six) hours as needed for wheezing or shortness of breath. 1 Inhaler 6  . albuterol (PROVENTIL) (2.5 MG/3ML) 0.083% nebulizer solution Take 3 mLs (2.5 mg total) by nebulization 4 (four) times daily. 75 mL 11  . ALPRAZolam (XANAX) 0.5 MG tablet TAKE 1/2 TO 1 TABLET THREE TIMES DAILY AS NEEDED 90 tablet 5  . aspirin 81 MG tablet Take 81 mg by mouth daily.    Marland Kitchen. atenolol (TENORMIN) 50 MG tablet TAKE 1 TABLET ONCE A DAY 30 tablet 3  . budesonide (PULMICORT) 0.25 MG/2ML nebulizer solution Take 2 mLs (0.25 mg total) by nebulization 2 (two) times daily. 60 mL 11  . calcium-vitamin D (OSCAL WITH D) 500-200 MG-UNIT per tablet Take 1 tablet by mouth 2 (two) times daily.      . Cholecalciferol (VITAMIN D3) 1000 UNITS CAPS Take 1 capsule by mouth daily.     Marland Kitchen. dextromethorphan-guaiFENesin (MUCINEX DM) 30-600 MG per 12 hr tablet Take 2 tablets by mouth every 12 (twelve) hours.      Marland Kitchen. etodolac (LODINE) 400 MG tablet TAKE ONE TABLET BY MOUTH EVERY 12 HOURS 60 tablet 3  . ferrous sulfate 325 (65 FE) MG tablet Take 325 mg by mouth daily with breakfast.     . fluticasone (FLONASE) 50 MCG/ACT nasal spray 2 SPRAYS IN EACH NOSTRIL ONCE A DAY 16 g 3  . furosemide (LASIX) 40 MG tablet TAKE ONE TABLET  BY MOUTH ONCE DAILY 30 tablet 3  . gabapentin (NEURONTIN) 300 MG capsule TAKE ONE CAPSULE BY MOUTH THREE TIMES DAILY 90 capsule 11  . glucose blood (ONE TOUCH ULTRA TEST) test strip TEST BLOOD SUGAR LEVELS THREE TIMES DAILY 100 each 5  . HYDROcodone-acetaminophen (NORCO/VICODIN) 5-325 MG per tablet Take 1 tablet by mouth every 6 (six) hours as needed for moderate pain. 15 tablet 0  . ibandronate (BONIVA) 150 MG tablet TAKE ONE TABLET BY MOUTH ONCE A MONTH 1 tablet 3  . insulin NPH-regular Human (NOVOLIN 70/30) (70-30) 100 UNIT/ML injection Inject 30 Units into the skin 2 (two) times daily with a meal.     . Insulin Syringe-Needle U-100 (RELION INSULIN SYR 0.5CC/30G) 30G X 5/16" 0.5 ML MISC USE TO TEST BLOOD SUGAR LEVELS THREE TIMES DAILY 100 each 3  . ipratropium (ATROVENT) 0.02 % nebulizer solution Take 2.5 mLs (500 mcg total) by nebulization 4 (four) times daily. 75 mL 11  . isosorbide mononitrate (IMDUR) 30 MG 24 hr tablet TAKE 1 TABLET DAILY 90 tablet 1  . losartan (COZAAR) 50 MG tablet TAKE ONE TABLET BY MOUTH ONCE DAILY 30 tablet 3  . nitroGLYCERIN (NITROSTAT) 0.4 MG SL tablet Place 1 tablet (0.4 mg total) under the tongue every 5 (five) minutes as needed. May repeat x3 25 tablet 6  . omeprazole (PRILOSEC)  20 MG capsule TAKE ONE CAPSULE BY MOUTH ONCE DAILY 30  MINUTES  BEFORE  A  MEAL 30 capsule 3  . ONE TOUCH LANCETS MISC Check blood sugars two times daily 100 each 6  . pioglitazone (ACTOS) 15 MG tablet Take 1 tablet (15 mg total) by mouth daily. 30 tablet 6  . Probiotic Product (PROBIOTIC DAILY PO) Take 1 tablet by mouth daily.     . simvastatin (ZOCOR) 80 MG tablet TAKE ONE TABLET AT BEDTIME 30 tablet 3  . traMADol (ULTRAM) 50 MG tablet TAKE ONE TABLET BY MOUTH EVERY 8 HOURS AS NEEDED FOR PAIN 90 tablet 2  . vitamin B-12 (CYANOCOBALAMIN) 500 MCG tablet Take 500 mcg by mouth daily.     . vitamin C (ASCORBIC ACID) 500 MG tablet Take 500 mg by mouth daily.    . predniSONE (DELTASONE) 20 MG  tablet 2 tabs po qd x 5d, then 1 tab po qd x 5d, then 1/2 tab po qd x 6d 18 tablet 0  . azithromycin (ZITHROMAX) 250 MG tablet 2 tabs po qd x 1d, then 1 tab po qd x 4d 6 tablet 0   No facility-administered medications prior to visit.    PE: Blood pressure 123/70, pulse 54, temperature 97.5 F (36.4 C), temperature source Temporal, resp. rate 20, height 5' 4.5" (1.638 m), weight 221 lb (100.245 kg), SpO2 96 %. Gen: Alert, well appearing.  Patient is oriented to person, place, time, and situation. Wearing oxygen/nasal cannulae. CV: distant S1 and S2, regular, no m/r. LUNGS: diminished BS throughout all lung fields, with a trace of high pitched wheeze at the end of each expiration.  Nonlabored resps. EXT: 1+ edema in pretibial regions, none in ankles or feet. Foot exam - both normal; no swelling, tenderness or skin or vascular lesions. Color and temperature is normal. Sensation is intact. Peripheral pulses are palpable. Toenails are normal.   IMPRESSION AND PLAN:  1) COPD exacerbation--resolved. Continue current chronic treatments for this, keep upcoming f/u with pulmonologist, Dr. Kriste BasqueNadel (02/02/14). She declined flu vaccine today.  2) DM 2, control improving. Foot exam normal today. Continue gabapentin for neuropathic pain.  An After Visit Summary was printed and given to the patient.  FOLLOW UP: 2mo

## 2014-01-08 NOTE — Addendum Note (Signed)
Addended by: Eulah PontALBRIGHT, Shila Kruczek M on: 01/08/2014 01:27 PM   Modules accepted: Orders

## 2014-01-26 ENCOUNTER — Other Ambulatory Visit: Payer: Self-pay | Admitting: Pulmonary Disease

## 2014-01-27 ENCOUNTER — Encounter: Payer: Self-pay | Admitting: Family Medicine

## 2014-01-27 ENCOUNTER — Ambulatory Visit (INDEPENDENT_AMBULATORY_CARE_PROVIDER_SITE_OTHER): Payer: Medicare Other | Admitting: Family Medicine

## 2014-01-27 VITALS — BP 155/84 | HR 74 | Temp 97.6°F | Resp 18 | Ht 64.5 in | Wt 221.0 lb

## 2014-01-27 DIAGNOSIS — R531 Weakness: Secondary | ICD-10-CM

## 2014-01-27 DIAGNOSIS — J441 Chronic obstructive pulmonary disease with (acute) exacerbation: Secondary | ICD-10-CM

## 2014-01-27 DIAGNOSIS — N182 Chronic kidney disease, stage 2 (mild): Secondary | ICD-10-CM

## 2014-01-27 DIAGNOSIS — R42 Dizziness and giddiness: Secondary | ICD-10-CM

## 2014-01-27 DIAGNOSIS — E162 Hypoglycemia, unspecified: Secondary | ICD-10-CM

## 2014-01-27 DIAGNOSIS — I25119 Atherosclerotic heart disease of native coronary artery with unspecified angina pectoris: Secondary | ICD-10-CM

## 2014-01-27 DIAGNOSIS — I2510 Atherosclerotic heart disease of native coronary artery without angina pectoris: Secondary | ICD-10-CM

## 2014-01-27 LAB — CBC WITH DIFFERENTIAL/PLATELET
BASOS ABS: 0.1 10*3/uL (ref 0.0–0.1)
Basophils Relative: 1 % (ref 0–1)
EOS ABS: 0.1 10*3/uL (ref 0.0–0.7)
EOS PCT: 2 % (ref 0–5)
HCT: 39.7 % (ref 36.0–46.0)
Hemoglobin: 13.7 g/dL (ref 12.0–15.0)
Lymphocytes Relative: 30 % (ref 12–46)
Lymphs Abs: 2.1 10*3/uL (ref 0.7–4.0)
MCH: 30.3 pg (ref 26.0–34.0)
MCHC: 34.5 g/dL (ref 30.0–36.0)
MCV: 87.8 fL (ref 78.0–100.0)
MPV: 9.1 fL — ABNORMAL LOW (ref 9.4–12.4)
Monocytes Absolute: 0.7 10*3/uL (ref 0.1–1.0)
Monocytes Relative: 10 % (ref 3–12)
NEUTROS ABS: 4 10*3/uL (ref 1.7–7.7)
Neutrophils Relative %: 57 % (ref 43–77)
Platelets: 271 10*3/uL (ref 150–400)
RBC: 4.52 MIL/uL (ref 3.87–5.11)
RDW: 12.9 % (ref 11.5–15.5)
WBC: 7.1 10*3/uL (ref 4.0–10.5)

## 2014-01-27 LAB — BASIC METABOLIC PANEL
BUN: 22 mg/dL (ref 6–23)
CALCIUM: 9.7 mg/dL (ref 8.4–10.5)
CO2: 34 mEq/L — ABNORMAL HIGH (ref 19–32)
CREATININE: 1.15 mg/dL — AB (ref 0.50–1.10)
Chloride: 100 mEq/L (ref 96–112)
GLUCOSE: 116 mg/dL — AB (ref 70–99)
Potassium: 4.7 mEq/L (ref 3.5–5.3)
Sodium: 141 mEq/L (ref 135–145)

## 2014-01-27 MED ORDER — PREDNISONE 20 MG PO TABS: ORAL_TABLET | ORAL | Status: DC

## 2014-01-27 MED ORDER — LEVOFLOXACIN 250 MG PO TABS: 250.0000 mg | ORAL_TABLET | Freq: Every day | ORAL | Status: DC

## 2014-01-27 MED ORDER — METHYLPREDNISOLONE ACETATE 80 MG/ML IJ SUSP
80.0000 mg | Freq: Once | INTRAMUSCULAR | Status: AC
  Administered 2014-01-27: 80 mg via INTRAMUSCULAR

## 2014-01-27 NOTE — Progress Notes (Signed)
Pre visit review using our clinic review tool, if applicable. No additional management support is needed unless otherwise documented below in the visit note. 

## 2014-01-27 NOTE — Progress Notes (Signed)
OFFICE VISIT  02/01/2014   CC:  Chief Complaint  Patient presents with  . Dizziness    yesterday morning, but started feeling better after dinner  . Cough   HPI:    Patient is a 74 y.o. Caucasian female who presents accompanied by her daughter for a sick visit:  for dizziness/lightheadedness. Onset yesterday morning about 1/2 hour after getting up out of bed, lightheaded feeling and felt like she had to sit or tilt to one side.  It did cause nausea but she didn't vomit.  These sx's were most intense in the morning and seemed to gradually improve through the day but is still present a little now.  No HA.  Had some double vision yesterday but none today.  Feels generalized weakness today but no ataxia or veering when walking.  No dysarthria, no focal weakness.  Eating and drinking ok.  She still has residual mucous in nose/sinus region/facial pressure, coughing up phlegm that is yellow.   No fever.  Mild feeling of SOB with exertion as per her usual but this may be worse than her usual--she admits it is hard to tell.  Glucoses the last day or two: "kinda low"---50s-70s.  Has not changed any med dosing. No dysuria, no urgency, no frequency that is new.  No n/v/d or constipation.  No melena/hematochezia.  No achiness in muscles and no swelling or redness of joints.  No headaches.  Describes 2 episodes of pain in chest over sternum that lasted a few seconds once but another time she had to take 1 NTG and this stopped it.  She thinks she was at rest or just walking around the house when these occurred.  Most recent episode of CP was approx 4-5 d/a.   Past Medical History  Diagnosis Date  . COPD (chronic obstructive pulmonary disease)     Oxygen prn ambulation  . Hyperlipidemia   . Essential hypertension, benign   . Anemia   . Anxiety   . Arthritis     L-spine, hips, knees, elbows, wrists, hands  . Coronary atherosclerosis of native coronary artery     PTCA/BMS LAD 2000, PTCA/brachytherapy  LAD 2001, LVEF 55%.  CABG  . Type 2 diabetes mellitus   . Osteoporosis   . Venous insufficiency   . Obesity   . Myocardial infarction     AMI 2000  . Asthma   . Sleep apnea     Not on CPAP  . DDD (degenerative disc disease), lumbar   . Herpes zoster 06/2013    Past Surgical History  Procedure Laterality Date  . Total abdominal hysterectomy      DUB (non-malignant reasons).  No hx of abnormal pap smears.  . Cholecystectomy    . Rotator cuff repair Right   . Cataract extraction w/phaco Left 06/30/2012    Procedure: CATARACT EXTRACTION PHACO AND INTRAOCULAR LENS PLACEMENT (IOC);  Surgeon: Gemma Payor, MD;  Location: [facility];  Service: Ophthalmology;  Laterality: Left;  CDE:12.32  . Coronary artery bypass graft      Outpatient Prescriptions Prior to Visit  Medication Sig Dispense Refill  . albuterol (PROVENTIL HFA;VENTOLIN HFA) 108 (90 BASE) MCG/ACT inhaler Inhale 2 puffs into the lungs every 6 (six) hours as needed for wheezing or shortness of breath. 1 Inhaler 6  . albuterol (PROVENTIL) (2.5 MG/3ML) 0.083% nebulizer solution Take 3 mLs (2.5 mg total) by nebulization 4 (four) times daily. 75 mL 11  . ALPRAZolam (XANAX) 0.5 MG tablet TAKE 1/2 TO 1 TABLET  THREE TIMES DAILY AS NEEDED 90 tablet 5  . aspirin 81 MG tablet Take 81 mg by mouth daily.    Marland Kitchen. atenolol (TENORMIN) 50 MG tablet TAKE 1 TABLET ONCE A DAY 30 tablet 3  . budesonide (PULMICORT) 0.25 MG/2ML nebulizer solution Take 2 mLs (0.25 mg total) by nebulization 2 (two) times daily. 60 mL 11  . calcium-vitamin D (OSCAL WITH D) 500-200 MG-UNIT per tablet Take 1 tablet by mouth 2 (two) times daily.      . Cholecalciferol (VITAMIN D3) 1000 UNITS CAPS Take 1 capsule by mouth daily.     Marland Kitchen. dextromethorphan-guaiFENesin (MUCINEX DM) 30-600 MG per 12 hr tablet Take 2 tablets by mouth every 12 (twelve) hours.      Marland Kitchen. etodolac (LODINE) 400 MG tablet TAKE ONE TABLET BY MOUTH EVERY 12 HOURS 60 tablet 3  . ferrous sulfate 325 (65 FE) MG tablet Take  325 mg by mouth daily with breakfast.     . fluticasone (FLONASE) 50 MCG/ACT nasal spray 2 SPRAYS IN EACH NOSTRIL ONCE A DAY 16 g 3  . furosemide (LASIX) 40 MG tablet TAKE ONE TABLET BY MOUTH ONCE DAILY 30 tablet 3  . gabapentin (NEURONTIN) 300 MG capsule TAKE ONE CAPSULE BY MOUTH THREE TIMES DAILY 90 capsule 11  . glucose blood (ONE TOUCH ULTRA TEST) test strip TEST BLOOD SUGAR LEVELS THREE TIMES DAILY 100 each 5  . HYDROcodone-acetaminophen (NORCO/VICODIN) 5-325 MG per tablet Take 1 tablet by mouth every 6 (six) hours as needed for moderate pain. 15 tablet 0  . ibandronate (BONIVA) 150 MG tablet TAKE ONE TABLET BY MOUTH ONCE A MONTH 1 tablet 3  . insulin NPH-regular Human (NOVOLIN 70/30) (70-30) 100 UNIT/ML injection Inject 30 Units into the skin 2 (two) times daily with a meal.     . Insulin Syringe-Needle U-100 (RELION INSULIN SYR 0.5CC/30G) 30G X 5/16" 0.5 ML MISC USE TO TEST BLOOD SUGAR LEVELS THREE TIMES DAILY 100 each 3  . ipratropium (ATROVENT) 0.02 % nebulizer solution Take 2.5 mLs (500 mcg total) by nebulization 4 (four) times daily. 75 mL 11  . isosorbide mononitrate (IMDUR) 30 MG 24 hr tablet TAKE 1 TABLET DAILY 90 tablet 1  . losartan (COZAAR) 50 MG tablet TAKE ONE TABLET BY MOUTH ONCE DAILY 30 tablet 3  . nitroGLYCERIN (NITROSTAT) 0.4 MG SL tablet Place 1 tablet (0.4 mg total) under the tongue every 5 (five) minutes as needed. May repeat x3 25 tablet 6  . omeprazole (PRILOSEC) 20 MG capsule TAKE ONE CAPSULE BY MOUTH ONCE DAILY 30  MINUTES  BEFORE  A  MEAL 30 capsule 3  . ONE TOUCH LANCETS MISC Check blood sugars two times daily 100 each 6  . pioglitazone (ACTOS) 15 MG tablet Take 1 tablet (15 mg total) by mouth daily. 30 tablet 6  . Probiotic Product (PROBIOTIC DAILY PO) Take 1 tablet by mouth daily.     . simvastatin (ZOCOR) 80 MG tablet TAKE ONE TABLET AT BEDTIME 30 tablet 3  . traMADol (ULTRAM) 50 MG tablet TAKE ONE TABLET BY MOUTH EVERY 8 HOURS AS NEEDED FOR PAIN 90 tablet 2  .  vitamin B-12 (CYANOCOBALAMIN) 500 MCG tablet Take 500 mcg by mouth daily.     . vitamin C (ASCORBIC ACID) 500 MG tablet Take 500 mg by mouth daily.     No facility-administered medications prior to visit.    Allergies  Allergen Reactions  . Codeine Nausea And Vomiting    vomiting  . Terbutaline Sulfate  REACTION: INTOL to terbutaline    ROS As per HPI  PE: Blood pressure 155/84, pulse 74, temperature 97.6 F (36.4 C), temperature source Temporal, resp. rate 18, height 5' 4.5" (1.638 m), weight 221 lb (100.245 kg), SpO2 96 %. 2 L oxygen [place] Gen: alert, tired appearing but NAD.  No pallor or jaundice. Pleasant affect.  Lucid thought and speech, cracking jokes occasionally. Neuro: nonfocal. Oral mucosa moist/pink. Face: no tenderness over sinuses, no mucous in nares or PND. TMs normal Neck: supple, no LAD. LUNG: markedly diminished aeration in all lung fields, with mildly prolonged exp phase and trace end exp wheezing.  Nonlabored resps, no tachypnea.   CV: distant S1 and S2, no audible m/rub ABD: soft, NT EXT: no cyanosis or pitting edema Skin: no rash  LABS:  None today Recent:    Chemistry      Component Value Date/Time   NA 141 01/27/2014 1436   K 4.7 01/27/2014 1436   CL 100 01/27/2014 1436   CO2 34* 01/27/2014 1436   BUN 22 01/27/2014 1436   CREATININE 1.15* 01/27/2014 1436   CREATININE 1.3* 12/21/2013 1350      Component Value Date/Time   CALCIUM 9.7 01/27/2014 1436   ALKPHOS 55 06/02/2013 1245   AST 15 06/02/2013 1245   ALT 17 06/02/2013 1245   BILITOT 0.4 06/02/2013 1245     Lab Results  Component Value Date   CHOL 167 09/21/2013   HDL 36.20* 09/21/2013   LDLCALC 56 09/21/2013   LDLDIRECT 73.9 07/16/2012   TRIG 376.0* 09/21/2013   CHOLHDL 5 09/21/2013   Lab Results  Component Value Date   WBC 7.1 01/27/2014   HGB 13.7 01/27/2014   HCT 39.7 01/27/2014   MCV 87.8 01/27/2014   PLT 271 01/27/2014   Lab Results  Component Value Date   TSH  2.09 07/16/2012   Lab Results  Component Value Date   HGBA1C 7.9* 12/21/2013     IMPRESSION AND PLAN:  1) COPD exacerbation: I think this is leading to the complexity of her current presentation today. Depo-medrol 80mg  IM today in office. Start prednisone 40mg  qd x 5d tomorrow.  Then 20mg  qd x 5d, then 10mg  qd x 6d, then stop. Levaquin 250mg  qd x 10 (renal dosing).  Continue oxygen supplement and pulmicort and bronchodilators and mucinex DM. She has f/u with Dr. Kriste BasqueNadel, her pulmonologist, early next week. Check CBC and BMET today.  2) Hypoglycemic recently in Insulin-requiring Type 2 diabetic: check renal function. Her glucoses will no doubt be going up now that she is getting on systemic steroids so we talked about the need for her to monitor glucoses closely and likely have to up-titrate insulin dosing carefully.  3) Generalized weakness: multifactorial/medical comorbidities + polypharmacy+ acute COPD exacerbation. Check CBC and BMET as well.  4) Dizziness and giddiness: see #3 above.  5) CRI, stage 2: BMET today.  6) CAD; known obstructions, medical management.  This does not seem to be progressing from a symptomology standpoint.  She has plans to schedule routine f/u with her cardiologist soon.  An After Visit Summary was printed and given to the patient.  FOLLOW UP: Return if symptoms worsen or fail to improve.

## 2014-02-01 ENCOUNTER — Encounter: Payer: Self-pay | Admitting: Family Medicine

## 2014-02-02 ENCOUNTER — Encounter: Payer: Self-pay | Admitting: Pulmonary Disease

## 2014-02-02 ENCOUNTER — Ambulatory Visit (INDEPENDENT_AMBULATORY_CARE_PROVIDER_SITE_OTHER): Payer: Medicare Other | Admitting: Pulmonary Disease

## 2014-02-02 VITALS — BP 138/82 | HR 67 | Temp 98.3°F | Ht 64.5 in | Wt 223.2 lb

## 2014-02-02 DIAGNOSIS — Z6837 Body mass index (BMI) 37.0-37.9, adult: Secondary | ICD-10-CM

## 2014-02-02 DIAGNOSIS — E669 Obesity, unspecified: Secondary | ICD-10-CM

## 2014-02-02 DIAGNOSIS — E119 Type 2 diabetes mellitus without complications: Secondary | ICD-10-CM

## 2014-02-02 DIAGNOSIS — K219 Gastro-esophageal reflux disease without esophagitis: Secondary | ICD-10-CM

## 2014-02-02 DIAGNOSIS — F419 Anxiety disorder, unspecified: Secondary | ICD-10-CM

## 2014-02-02 DIAGNOSIS — I10 Essential (primary) hypertension: Secondary | ICD-10-CM

## 2014-02-02 DIAGNOSIS — J9611 Chronic respiratory failure with hypoxia: Secondary | ICD-10-CM

## 2014-02-02 DIAGNOSIS — J449 Chronic obstructive pulmonary disease, unspecified: Secondary | ICD-10-CM

## 2014-02-02 DIAGNOSIS — I255 Ischemic cardiomyopathy: Secondary | ICD-10-CM

## 2014-02-02 DIAGNOSIS — E785 Hyperlipidemia, unspecified: Secondary | ICD-10-CM

## 2014-02-02 DIAGNOSIS — D649 Anemia, unspecified: Secondary | ICD-10-CM

## 2014-02-02 DIAGNOSIS — I2510 Atherosclerotic heart disease of native coronary artery without angina pectoris: Secondary | ICD-10-CM

## 2014-02-02 DIAGNOSIS — M810 Age-related osteoporosis without current pathological fracture: Secondary | ICD-10-CM

## 2014-02-02 DIAGNOSIS — I872 Venous insufficiency (chronic) (peripheral): Secondary | ICD-10-CM

## 2014-02-02 NOTE — Progress Notes (Signed)
Subjective:    Patient ID: [name], female    DOB: 16-Oct-1940, 73 y.o.   MRN: [id number]  HPI 73 y/o WF here for a follow up visit... [name] has 13 grandchildren & 18 great-grands!  She also has mult med problems including severe COPD; HBP; CAD w/prev MI & PTCA followed by DrMcDowell; VI; Hyperlipidemia; DM on insulin; Obesity; GERD/ constipation; DJD/ osteoporosis; Anxiety...   ~  SEE PREV EPIC NOTES FOR OLDER DATA >>   ~  Jul 16, 2012:  4-46mo ROV & post hosp check> [name] was eval recently by [facility] for CP & an abn Myoview- Cath 5/14 revealed mod diffuse LAD disease w/ 50%proxLAD stenosis w/ a 60-70% in-stent restenosis in the midLAD; RCA was small w/ a 75% prox stenosis; LCx was dominant w/o focal stenosis but w/ mod diffuse calcium; HK of inferoapical wall w/ EF= 35-40% and mild-mod MR (Rec was for continued medical management)... We reviewed the following medical problems during today's office visit >>     HxSinusitis> on MMW, Flonase, OTC antihist prn; c/o chr congestion, no recent infection...    COPD> on O2, NEBS w/ Duoneb/ Budes, Mucinex2Bid; stable w/o exac & denies ch in cough/ sput/ SOB, etc...    HBP> on Aten50, Losar50, Lasix40; labs 5/14 w/ normal lytes & renal; BP= 126/82 & she denies CP, palpit, ch in SOB, edema, etc...    CAD> on ASA81, NTG prn; followed by DrMcDowell- seen 5/14 w/ abn Myoview & subseq Cath- AS ABOVE, med rx...    VI> stable on low sodium diet, Lasix40, elevation, support hose...    Hyperlipid> on Simva80; FLP 5/14 shows TChol 134, TG 227, HDL 33, LDL 74... Advised better low fat diet, exercise, & get wt down...    DM> on 70/30 insulin 30Bid, Metform500Bid; doing well she says; labs today showed BS=160, A1c=7.3 (sl improved- continue same + better diet)...    Obesity> wt is 201# which is down 6# from last visit; we reviewed diet, exercise, wt reduction strategies...    GI- GERD, Constip> on Omep20 & Probiotic; hx gastritis & prepyloric ulcers on EGD 3/12, rec to stay  on the PPI Rx; last colon 2007 by DrJacobs was neg...    DJD> on Lodine400Bid prn, Tramadol50Tid prn, folowed by [facility]...    Osteoporosis> on Boniva150, calcium, MVI, VitD1000; labs 8/13 showed VitD=38; last BMD 5/13 w/ TScore -1.6 in hips...    Neuropathy> on Neurontin300Qhs...    Anxiety> on Xanax0.5mg  prn...    Hx Borderline Anemia> on Fe, B12 1010mcg/d; labs 5/14 showed Hg=13.2, MCV=90, last Fe was 8/13 = 59 (18%sat),  We reviewed prob list, meds, xrays and labs> see below for updates >>   LABS 5/14:  FLP- not at goals w/ TG=227, HDL=33;  Chems- ok x BS=160, A1c=7.3;  CBC- wnl;  TSH=2.09...  ~  November 14, 2012:  66mo ROV & Shanin is added-on for URI w/ cough, thick yellow sput, feeling poorly, SOB, wheezing, etc;  We called in Augmentin & rec nasal saline- min improvement but persist symptoms;  Exam shows decr BS 7 end exp wheezing/ rhonchi;  She has underlying COPD, ex-smoker, hx sinusitis, on NEBS, Mucinex;  Rec- Levaquin x10d, Depo80, Dosepak, incr Mucinex to 2Bid + fluids...     BP remains controlled on Aten, Losar, Lasix & measures 134/82;  She denies CP, palpit, edema;  Chol has been stable on Simva80 & she doesn't want to change;  DM controlled w/ 70/30 insulin on 30uBid, hasn't been able  to lose weight stating "I can't starve myself"    We reviewed prob list, meds, xrays and labs> see below for updates >>   ~  February 02, 2013:  2-47mo ROV & [name] has recovered from her bronchitis exac & now back to baseline; she is concerned about her memory & notes that she is under a lot of stress...    Breathing is now stable on her NEBS w/ Albut/ Ipratrop/ Budes, Mucinex, Flonase, MMW...    BP is controlled on Aten50, Losar50, Lasix40 & measures 140/76 today... She denies CP, palpit, ch in SOB, etc on her ASA81 & Imdur30...     Chol is regulated w/ Simva40 Qhs- last FLP 5/14 showed sl elev TG & she is reminded to follow a strict low fat diet 7 work on weight reduction...    DM control is fair on  her Metform500Bid and 70/30 insulin taking 30u Bid; Labs today showed BS=203, A1c= 7.6 & we rec that she incr the insulin to 35u Bid...    She remains on Prilosec20, [id number], Neurontin, Tramadol, Alprazolam as well...  We reviewed prob list, meds, xrays and labs> see below for updates >> she refuses the 2014 Flu vaccine...  LABS 12/14:  chems- ok x BS=203, A1c=7.6 on her Metform500Bid & 70/30 insulin 30uBid=> Rec to incr to 35uBid + diet & exercise...  ~  August 03, 2013:  33mo ROV & [name] has established Primary Care w/ Dr. Nicoletta Ba at [facility];  She had right T10 Shingles 5/15- resolving;  Here for pulm follow up of her severe COPD >>     HxSinusitis> on MMW, Flonase, OTC antihist prn; c/o chr congestion, no recent infection...    COPD (GOLD Stage4)> on O2 (Helios system- 2L/min continuously), NEBS w/ DuonebQid/ BudesBid, Mucinex2Bid; stable w/o exac but at baseline has cough, thick beige sput, no hemoptysis, & chr dyspnea- ok w/ ADLs in general but SOB w/ chores/ housework/ walking in the house, etc...  We reviewed prob list, meds, xrays (3/15 CXR) and labs (3/15)> see below for updates >> REC to continue O2, NEBS, Mucinex & call promptly for any infectious exac etc...  CXR 3/15 showed cardiomeg, COPD/ atelectasis in lingula & LLL, DJD in Tspine & shoulders, NAD...  PFT 6/15 showed FVC=0.81 (28%), FEV1=0.43 (20%), %1sec=53%, mid-flows= 9% predicted... c/w GOLD Stage4 COPD.   ~  February 02, 2014:  33mo ROV & [name] has seen DrMcGowen several times- most recently 01/27/14 for a sick visit & felt to have a COPD exac- treaqted w/ Depo80, Pred taper, & Levaquin;  She states improved , breathing is better w/ min cough, whitish sput, & no f/c/s; she has baseline DOE- no better due to 17# wt gain & she admits to not dieting & not exercising! We reviewed diet, exercise & wt reduction strategies;  We walked her in the office on her O2 at 2L/min (see below);  We reviewed the following medical problems  during today's office visit >>     HxSinusitis> on MMW, Flonase, OTC antihist prn; c/o chr congestion, & recent infection treated by DrMcGowen w/ Levaquin...    COPD (GOLD Stage4)> on O2 (Helios system- 2L/min continuously), NEBS w/ DuonebQid/ BudesBid, Mucinex2Bid; +recent exac as noted & improved on Levaquin & Pred taper; chr dyspnea- ok w/ ADLs in general but SOB w/ chores/ housework/ walking in the house, etc; she has gained wt, not dieting & not exercising- we read her the riot act!    BP is controlled on  Aten50, Losar50, Lasix40 & measures 138/82 today... She denies CP, palpit, ch in SOB, etc on her ASA81 & Imdur30...     Chol is regulated w/ Simva80 Qhs- last FLP 7/15 showed elev TG & she is reminded to follow a strict low fat diet & work on weight reduction...    DM control is fair on her Actos15 and 70/30 insulin taking 30u Bid per Primary Care; Labs 10/15 showed BS=156, A1c= 7.9; she will f/u w/ Primary care soon    She remains on Prilosec20, Boniva150, Neurontin, Lodine/Tramadol, Vicodin, Alprazolam as well...  We reviewed prob list, meds, xrays and labs> see below for updates >> she continues to REFUSE Flu shots and Pneumonia vaccine...  Oxygen saturation on 2L/min at rest= 95% w/ pulse=64/min;  After walking 3 laps her O2sat=93% w/ pulse=84/min... Rec to continue same.           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 Rx'd w/ MUCINEX, Nasal Saline, FLONASE...   ~  2/11:  c/o sinusitis & AUGMENTIN perscribed. ~  9/13:  ER visit w/ jaw pain- had wisdom tooth extracted 6/13 by DrLewis in [place]; CT showed socket left by right mandib molar extraction w/ inflamm; given PenVK, Vicodin, told f/u w/ dentist...  COPD (ICD-496) - she has COPD and is an ex-smoker... controlled on NEBULIZER w/ Duoneb Qid + Budesonide Bid, MUCINEX, etc... she has end stage disease w/ severe dyspnea w/ ADLs and no change over the last  yr... ~  baseline CXR = COPD, incr heart size, NAD. ~ CT Chest 2/02 showed COPD, Atx, sl scarring, NAD. ~  bronchoscopy 3/02 w/ mucous plugging, no endobr lesions. ~  PFT's in 12/07 showed FVC=0.81 (27%), FEV1=0.54 (22%), %1sec=67, mid-flows=13%pred. ~  CXR 1/10 showed chr changes, cardiomeg, lingular atx. ~  CXR 2/11 showed cardiomeg, bibasilar scarring, NAD. ~  CXR 2/12 showed cardiomeg, COPD,scarring left base, NAD. ~  CXR 5/14 showed cardiomeg, mild basilar atx/ flattened diaph/ NAD, DJD in Tspine, s/p chole... ~  9/14:  Presents w/ URI & AB exac- unresponsive to Augmentin called in, & rec Levaquin, Depo, Dosepak, Mucinex, Fluids, etc... ~  CXR 3/15 showed cardiomeg, COPD/ atelectasis in lingula & LLL, DJD in Tspine & shoulders, NAD... ~  6/15: on O2 (Helios- 2L/min continuously), NEBS w/ DuonebQid/ BudesBid, Mucinex2Bid; stable w/o exac but at baseline has cough, thick beige sput, no hemoptysis, & chr dyspnea- ok w/ ADLs in general but SOB w/ chores/ housework/ walking in the house, etc...  ~  PFT 6/15 showed FVC=0.81 (28%), FEV1=0.43 (20%), %1sec=53%, mid-flows= 9% predicted... c/w GOLD Stage4 COPD ~  12/15: she had a recent infectious exac treated by DrMcGowen w/ Levaquin & Pred; she is improved & approaching baseline, however she has gained 17# & not on diet & not exercising; we discussed need for diet, exercise & wt reduction; she will finish the Levaquin & Pred taper and continue the O2, NEBS, Mucinex, etc...  ~  Oxygen saturation on 2L/min at rest= 95% w/ pulse=64/min;  After walking 3 laps her O2sat=93% w/ pulse=84/min... Rec to continue same.    SHE HAS ESTABLISHED PRIMARY CARE AT [facility]- DrMcGowen >>   HYPERTENSION (ICD-401.9) - on ATENOLOL 50mg /d, LOSARTAN 50mg /d, LASIX 40mg /d... BP=126/80 and tol well... denies HA, visual changes, CP, palipit, dizziness, syncope, change in dyspnea, edema, etc.  CORONARY ARTERY DISEASE (ICD-414.00) - on ASA 81mg /d & Imdur30... hx of  CAD w/ prev MI  in 2000, and subseq PTCA's in 2000, & 2001...  VENOUS INSUFFICIENCY (ICD-459.81) - low sodium diet, elevation, & Lasix 40-80mg /d... states she "allergic" to TED hose! and refuses to wear them...   HYPERLIPIDEMIA (ICD-272.4) - on ZOCOR 80mg /d + FISH OIL 1000mg /d ... she has been counselled on diet & exercise but unsuccessful...  DM (ICD-250.00) - on Actos15 + HUMULIN 70/30- now taking 30uAM & 30uPM she says...  OBESITY (ICD-278.00) - weight = 223# (BMI=37); we discussed diet + exercise program required to lose weight!  GERD (ICD-530.81) - on PPI therapy (changed 2/10 per request to OMEPRAZOLE20mg /d)... ~  [facility] 3/12 for COPD exac & was anemic w/ heme pos stool & GIB; EGD by DrJacobs w/ gastritis & prepyloric ulcers, +HPylori- treated & improved.  CONSTIPATION (ICD-564.00) - last colonoscopy was 11/07 by DrJacobs and negative...  DEGENERATIVE JOINT DISEASE (ICD-715.90) - on Vicodin, Trmaadol, Neurontin; prev CSpine films w/ spondy and biforaminal narrowing... LSSpine films w/ DDD and osteopenia...   OSTEOPOROSIS (ICD-733.00) - on BONIVA 150mg /mo, Calcium, Vits...   ANXIETY (ICD-300.00) - uses Alprazolam 0.5mg  as needed...  Hx ANEMIA, MILD (ICD-285.9)  Health Maintenance - she hasn't seen GYN in yrs and is asked to re-establish... prev refused Mammograms due to the discomfort & bruising- she will discuss w/ Gyn & get a follow up BMD as well...   Past Surgical History  Procedure Laterality Date  . Total abdominal hysterectomy      DUB (non-malignant reasons).  No hx of abnormal pap smears.  . Cholecystectomy    . Rotator cuff repair Right   . Cataract extraction w/phaco Left 06/30/2012    Procedure: CATARACT EXTRACTION PHACO AND INTRAOCULAR LENS PLACEMENT (IOC);  Surgeon: Gemma Payor, MD;  Location: [facility];  Service: Ophthalmology;  Laterality: Left;  CDE:12.32  . Cardiac catheterization  07/2012    + progressive CAD but no lesions to intervene upon, EF 65%; med mgmt  continued  . Transthoracic echocardiogram  06/2013    EF 40%, + WMA  . Coronary angioplasty with stent placement      PTCA/BMS LAD 2000, PTCA/brachytherapy LAD 2001, LVEF 55%     Outpatient Encounter Prescriptions as of 02/02/2014  Medication Sig  . albuterol (PROVENTIL HFA;VENTOLIN HFA) 108 (90 BASE) MCG/ACT inhaler Inhale 2 puffs into the lungs every 6 (six) hours as needed for wheezing or shortness of breath.  Marland Kitchen albuterol (PROVENTIL) (2.5 MG/3ML) 0.083% nebulizer solution Take 3 mLs (2.5 mg total) by nebulization 4 (four) times daily.  Marland Kitchen ALPRAZolam (XANAX) 0.5 MG tablet TAKE 1/2 TO 1 TABLET THREE TIMES DAILY AS NEEDED  . aspirin 81 MG tablet Take 81 mg by mouth daily.  Marland Kitchen atenolol (TENORMIN) 50 MG tablet TAKE 1 TABLET ONCE A DAY  . budesonide (PULMICORT) 0.25 MG/2ML nebulizer solution Take 2 mLs (0.25 mg total) by nebulization 2 (two) times daily.  . calcium-vitamin D (OSCAL WITH D) 500-200 MG-UNIT per tablet Take 1 tablet by mouth 2 (two) times daily.    . Cholecalciferol (VITAMIN D3) 1000 UNITS CAPS Take 1 capsule by mouth daily.   Marland Kitchen dextromethorphan-guaiFENesin (MUCINEX DM) 30-600 MG per 12 hr tablet Take 2 tablets by mouth every 12 (twelve) hours.    Marland Kitchen etodolac (LODINE) 400 MG tablet TAKE ONE TABLET BY MOUTH EVERY 12 HOURS  . ferrous sulfate 325 (65 FE) MG tablet Take 325 mg by mouth daily with breakfast.   . fluticasone (FLONASE) 50 MCG/ACT nasal spray 2 SPRAYS IN EACH NOSTRIL ONCE A DAY  . furosemide (LASIX) 40  MG tablet TAKE ONE TABLET BY MOUTH ONCE DAILY  . gabapentin (NEURONTIN) 300 MG capsule TAKE ONE CAPSULE BY MOUTH THREE TIMES DAILY  . glucose blood (ONE TOUCH ULTRA TEST) test strip TEST BLOOD SUGAR LEVELS THREE TIMES DAILY  . HYDROcodone-acetaminophen (NORCO/VICODIN) 5-325 MG per tablet Take 1 tablet by mouth every 6 (six) hours as needed for moderate pain.  Marland Kitchen ibandronate (BONIVA) 150 MG tablet TAKE ONE TABLET BY MOUTH ONCE A MONTH  . insulin NPH-regular Human (NOVOLIN 70/30)  (70-30) 100 UNIT/ML injection Inject 30 Units into the skin 2 (two) times daily with a meal.   . Insulin Syringe-Needle U-100 (RELION INSULIN SYR 0.5CC/30G) 30G X 5/16" 0.5 ML MISC USE TO TEST BLOOD SUGAR LEVELS THREE TIMES DAILY  . ipratropium (ATROVENT) 0.02 % nebulizer solution Take 2.5 mLs (500 mcg total) by nebulization 4 (four) times daily.  . isosorbide mononitrate (IMDUR) 30 MG 24 hr tablet TAKE 1 TABLET DAILY  . levofloxacin (LEVAQUIN) 250 MG tablet Take 1 tablet (250 mg total) by mouth daily.  Marland Kitchen losartan (COZAAR) 50 MG tablet TAKE ONE TABLET BY MOUTH ONCE DAILY  . nitroGLYCERIN (NITROSTAT) 0.4 MG SL tablet Place 1 tablet (0.4 mg total) under the tongue every 5 (five) minutes as needed. May repeat x3  . omeprazole (PRILOSEC) 20 MG capsule TAKE ONE CAPSULE BY MOUTH ONCE DAILY 30  MINUTES  BEFORE  A  MEAL  . ONE TOUCH LANCETS MISC Check blood sugars two times daily  . pioglitazone (ACTOS) 15 MG tablet Take 1 tablet (15 mg total) by mouth daily.  . predniSONE (DELTASONE) 20 MG tablet 2 tabs po qd x 5d, then 1 tab po qd x 5d, then 1/2 tab po qd x 6d  . Probiotic Product (PROBIOTIC DAILY PO) Take 1 tablet by mouth daily.   . simvastatin (ZOCOR) 80 MG tablet TAKE ONE TABLET AT BEDTIME  . traMADol (ULTRAM) 50 MG tablet TAKE ONE TABLET BY MOUTH EVERY 8 HOURS AS NEEDED FOR PAIN  . vitamin B-12 (CYANOCOBALAMIN) 500 MCG tablet Take 500 mcg by mouth daily.   . vitamin C (ASCORBIC ACID) 500 MG tablet Take 500 mg by mouth daily.     Allergies  Allergen Reactions  . Codeine Nausea And Vomiting    vomiting  . Terbutaline Sulfate     REACTION: INTOL to terbutaline    Current Medications, Allergies, Past Medical History, Past Surgical History, Family History, and Social History were reviewed in [employer] record.    Review of Systems         See HPI - all other systems neg except as noted...   The patient complains of dyspnea on exertion, peripheral edema, muscle  weakness, and difficulty walking.  The patient denies anorexia, fever, weight loss, weight gain, vision loss, decreased hearing, hoarseness, chest pain, syncope, prolonged cough, headaches, hemoptysis, abdominal pain, melena, hematochezia, severe indigestion/heartburn, hematuria, incontinence, suspicious skin lesions, transient blindness, depression, unusual weight change, abnormal bleeding, enlarged lymph nodes, and angioedema.    Objective:   Physical Exam    WD, Obese, 73 y/o WF in NAD... GENERAL:  Alert & oriented; pleasant & cooperative...  HEENT:  [place]/AT, EOM-wnl, PERRLA, EACs-clear, TMs-wnl, NOSE-clear, THROAT-clear & wnl. NECK:  Supple w/ fairROM; no JVD; normal carotid impulses w/o bruits; no thyromegaly or nodules palpated; no lymphadenopathy. CHEST:  Decr BS bilat, few scat rhonchi, without wheezing, rales, or rubs...upper airway psuedo-wheezing HEART:  Regular Rhythm;  gr 1/6 SEM without rubs or gallops detected... ABDOMEN:  Obese w/ panniculus, soft & nontender; normal bowel sounds; no organomegaly or masses detected. EXT: without deformities, mild arthritic changes; no varicose veins/ +venous insuffic/ tr edema. Sl tender on palpation over lumbar area & siatic notch... NEURO:  CN's intact; no focal neuro deficits... DERM:  no lesion seen...  RADIOLOGY DATA:  Reviewed in the EPIC EMR & discussed w/ the patient...  LABORATORY DATA:  Reviewed in the EPIC EMR & discussed w/ the patient...   Assessment & Plan:    COPD>  continues home Oxygen, Nebulizer, Mucinex, & Fluids;  Recent exac treated  By DrMcGowen w/ Levaquin, Depo80, Pred, & resolved...  HBP>  Good control on Atenolol50, Losartan50, Furosemide40; discussed diet, must get wt down, no sodium...  CAD>  [place]- recent cath reviewed, no option for surg or intervention, continue medical management...  HYPERLIPID>  On Simva80 + Fish Oil; needs better diet & get wt down!  DM>  Control  remains fair as she has been unable to diet effectively, lose weight, etc; now on 70/30 insulin taking 30u Bid & A1c is 7.6 therefore rec incr to 35u Bid...  OBESITY>  Weight reduction is key to improvement in all areas...  GI> GERD, Ulcers, +HPylori, Constip>  Improved after HPylori Rx & back on Omep 20mg /d...  DJD, LBP, etc>  Lumbar XRay reviewed, trial Tramadol, Robaxin, rest/ heat/ etc... Refer to Ortho for further eval & Rx...  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  Anxiety>  Alprazolam for prn use- refilled...  Anemia>  On Fe daily and Hg is improved (s/p hosp w/ GIB)...   Patient's Medications  New Prescriptions   No medications on file  Previous Medications   ALBUTEROL (PROVENTIL HFA;VENTOLIN HFA) 108 (90 BASE) MCG/ACT INHALER    Inhale 2 puffs into the lungs every 6 (six) hours as needed for wheezing or shortness of breath.   ALBUTEROL (PROVENTIL) (2.5 MG/3ML) 0.083% NEBULIZER SOLUTION    Take 3 mLs (2.5 mg total) by nebulization 4 (four) times daily.   ALPRAZOLAM (XANAX) 0.5 MG TABLET    TAKE 1/2 TO 1 TABLET THREE TIMES DAILY AS NEEDED   ASPIRIN 81 MG TABLET    Take 81 mg by mouth daily.   ATENOLOL (TENORMIN) 50 MG TABLET    TAKE 1 TABLET ONCE A DAY   BUDESONIDE (PULMICORT) 0.25 MG/2ML NEBULIZER SOLUTION    Take 2 mLs (0.25 mg total) by nebulization 2 (two) times daily.   CALCIUM-VITAMIN D (OSCAL WITH D) 500-200 MG-UNIT PER TABLET    Take 1 tablet by mouth 2 (two) times daily.     CHOLECALCIFEROL (VITAMIN D3) 1000 UNITS CAPS    Take 1 capsule by mouth daily.    DEXTROMETHORPHAN-GUAIFENESIN (MUCINEX DM) 30-600 MG PER 12 HR TABLET    Take 2 tablets by mouth every 12 (twelve) hours.     ETODOLAC (LODINE) 400 MG TABLET    TAKE ONE TABLET BY MOUTH EVERY 12 HOURS   FERROUS SULFATE 325 (65 FE) MG TABLET    Take 325 mg by mouth daily with breakfast.    FLUTICASONE (FLONASE) 50 MCG/ACT NASAL SPRAY    2 SPRAYS IN EACH NOSTRIL ONCE A DAY    FUROSEMIDE (LASIX) 40 MG TABLET    TAKE ONE TABLET BY MOUTH ONCE DAILY   GABAPENTIN (NEURONTIN) 300 MG CAPSULE    TAKE ONE CAPSULE BY MOUTH THREE TIMES DAILY   GLUCOSE BLOOD (ONE TOUCH ULTRA TEST)  TEST STRIP    TEST BLOOD SUGAR LEVELS THREE TIMES DAILY   HYDROCODONE-ACETAMINOPHEN (NORCO/VICODIN) 5-325 MG PER TABLET    Take 1 tablet by mouth every 6 (six) hours as needed for moderate pain.   INSULIN NPH-REGULAR HUMAN (NOVOLIN 70/30) (70-30) 100 UNIT/ML INJECTION    Inject 30 Units into the skin 2 (two) times daily with a meal.    INSULIN SYRINGE-NEEDLE U-100 (RELION INSULIN SYR 0.5CC/30G) 30G X 5/16" 0.5 ML MISC    USE TO TEST BLOOD SUGAR LEVELS THREE TIMES DAILY   IPRATROPIUM (ATROVENT) 0.02 % NEBULIZER SOLUTION    Take 2.5 mLs (500 mcg total) by nebulization 4 (four) times daily.   ISOSORBIDE MONONITRATE (IMDUR) 30 MG 24 HR TABLET    TAKE 1 TABLET DAILY   LEVOFLOXACIN (LEVAQUIN) 250 MG TABLET    Take 1 tablet (250 mg total) by mouth daily.   NITROGLYCERIN (NITROSTAT) 0.4 MG SL TABLET    Place 1 tablet (0.4 mg total) under the tongue every 5 (five) minutes as needed. May repeat x3   OMEPRAZOLE (PRILOSEC) 20 MG CAPSULE    TAKE ONE CAPSULE BY MOUTH ONCE DAILY 30  MINUTES  BEFORE  A  MEAL   ONE TOUCH LANCETS MISC    Check blood sugars two times daily   PIOGLITAZONE (ACTOS) 15 MG TABLET    Take 1 tablet (15 mg total) by mouth daily.   PREDNISONE (DELTASONE) 20 MG TABLET    2 tabs po qd x 5d, then 1 tab po qd x 5d, then 1/2 tab po qd x 6d   PROBIOTIC PRODUCT (PROBIOTIC DAILY PO)    Take 1 tablet by mouth daily.    SIMVASTATIN (ZOCOR) 80 MG TABLET    TAKE ONE TABLET AT BEDTIME   TRAMADOL (ULTRAM) 50 MG TABLET    TAKE ONE TABLET BY MOUTH EVERY 8 HOURS AS NEEDED FOR PAIN   VITAMIN B-12 (CYANOCOBALAMIN) 500 MCG TABLET    Take 500 mcg by mouth daily.    VITAMIN C (ASCORBIC ACID) 500 MG TABLET    Take 500 mg by mouth daily.  Modified Medications   Modified Medication Previous Medication   IBANDRONATE  (BONIVA) 150 MG TABLET ibandronate (BONIVA) 150 MG tablet      TAKE 1 TABLET EVERY MONTH ON THE SAME DAY    TAKE ONE TABLET BY MOUTH ONCE A MONTH   LOSARTAN (COZAAR) 50 MG TABLET losartan (COZAAR) 50 MG tablet      TAKE ONE TABLET BY MOUTH ONCE DAILY    TAKE ONE TABLET BY MOUTH ONCE DAILY  Discontinued Medications   No medications on file

## 2014-02-02 NOTE — Patient Instructions (Signed)
Today we updated your med list in our EPIC system...    Continue your current medications the same, including the Oxygen, NEB Rx, and Mucinex...  Finish the current Levaquin and Prednisone as directed by DrMcGowen...  Let's get on track w/ our diet 7 exercise program...    The goal is to lose 20 lbs!!!  Call for any questions...  Let's plan a follow up visit in 41mo, sooner if needed for problems.Marland Kitchen..Marland Kitchen

## 2014-02-03 ENCOUNTER — Other Ambulatory Visit: Payer: Self-pay | Admitting: Family Medicine

## 2014-02-15 ENCOUNTER — Other Ambulatory Visit: Payer: Self-pay | Admitting: Family Medicine

## 2014-02-15 NOTE — Telephone Encounter (Signed)
Pt requesting rf of tramadol.  Last OV was 01/27/14.  Last RF was 8/25 x 2 rfs.  Please advise.

## 2014-03-09 ENCOUNTER — Other Ambulatory Visit: Payer: Self-pay | Admitting: Family Medicine

## 2014-03-15 ENCOUNTER — Other Ambulatory Visit: Payer: Self-pay | Admitting: Family Medicine

## 2014-03-15 ENCOUNTER — Encounter: Payer: Self-pay | Admitting: Family Medicine

## 2014-03-15 ENCOUNTER — Ambulatory Visit (INDEPENDENT_AMBULATORY_CARE_PROVIDER_SITE_OTHER): Payer: PPO | Admitting: Family Medicine

## 2014-03-15 VITALS — BP 159/72 | HR 68 | Temp 97.0°F | Resp 18 | Ht 64.5 in | Wt 233.0 lb

## 2014-03-15 DIAGNOSIS — J441 Chronic obstructive pulmonary disease with (acute) exacerbation: Secondary | ICD-10-CM

## 2014-03-15 DIAGNOSIS — S93401A Sprain of unspecified ligament of right ankle, initial encounter: Secondary | ICD-10-CM

## 2014-03-15 MED ORDER — AZITHROMYCIN 250 MG PO TABS: ORAL_TABLET | ORAL | Status: DC

## 2014-03-15 MED ORDER — METHYLPREDNISOLONE ACETATE 80 MG/ML IJ SUSP
80.0000 mg | Freq: Once | INTRAMUSCULAR | Status: AC
  Administered 2014-03-15: 80 mg via INTRAMUSCULAR

## 2014-03-15 MED ORDER — PREDNISONE 20 MG PO TABS: ORAL_TABLET | ORAL | Status: DC

## 2014-03-15 NOTE — Addendum Note (Signed)
Addended by: Cydney OkAUGUSTIN, Hillard Goodwine N on: 03/15/2014 02:02 PM   Modules accepted: Orders

## 2014-03-15 NOTE — Progress Notes (Signed)
OFFICE NOTE  03/15/2014  CC:  Chief Complaint  Patient presents with  . Foot Swelling    right foot  . Congestion   HPI: Patient is a 74 y.o. Caucasian female who is here for respiratory complaints + right foot swelling. Coughing up phlegm worse, feeling more DOE last few days, wheezing worse: everything is worse today.  No fever.  +Runny nose, no ST.  Using albut neb q4h and it is helping.  No n/v/d.    She twisted her right ankle about 1 week ago but didn't fall.  It has swollen up since then , goes down when she lies down at night, swells up and hurts a lot in daytime.  Hurts mainly on medial aspect of ankle and distal lower leg.   Pertinent PMH:  Past medical, surgical, social, and family history reviewed and no changes are noted since last office visit.  MEDS:  Outpatient Prescriptions Prior to Visit  Medication Sig Dispense Refill  . albuterol (PROVENTIL HFA;VENTOLIN HFA) 108 (90 BASE) MCG/ACT inhaler Inhale 2 puffs into the lungs every 6 (six) hours as needed for wheezing or shortness of breath. 1 Inhaler 6  . albuterol (PROVENTIL) (2.5 MG/3ML) 0.083% nebulizer solution Take 3 mLs (2.5 mg total) by nebulization 4 (four) times daily. 75 mL 11  . ALPRAZolam (XANAX) 0.5 MG tablet TAKE 1/2 TO 1 TABLET THREE TIMES DAILY AS NEEDED 90 tablet 5  . aspirin 81 MG tablet Take 81 mg by mouth daily.    Marland Kitchen atenolol (TENORMIN) 50 MG tablet TAKE 1 TABLET ONCE A DAY 30 tablet 3  . budesonide (PULMICORT) 0.25 MG/2ML nebulizer solution Take 2 mLs (0.25 mg total) by nebulization 2 (two) times daily. 60 mL 11  . calcium-vitamin D (OSCAL WITH D) 500-200 MG-UNIT per tablet Take 1 tablet by mouth 2 (two) times daily.      . Cholecalciferol (VITAMIN D3) 1000 UNITS CAPS Take 1 capsule by mouth daily.     Marland Kitchen dextromethorphan-guaiFENesin (MUCINEX DM) 30-600 MG per 12 hr tablet Take 2 tablets by mouth every 12 (twelve) hours.      Marland Kitchen etodolac (LODINE) 400 MG tablet TAKE ONE TABLET BY MOUTH EVERY 12 HOURS 60  tablet 3  . ferrous sulfate 325 (65 FE) MG tablet Take 325 mg by mouth daily with breakfast.     . fluticasone (FLONASE) 50 MCG/ACT nasal spray 2 SPRAYS IN EACH NOSTRIL ONCE A DAY 16 g 3  . furosemide (LASIX) 40 MG tablet TAKE ONE TABLET BY MOUTH ONCE DAILY 30 tablet 3  . gabapentin (NEURONTIN) 300 MG capsule TAKE ONE CAPSULE BY MOUTH THREE TIMES DAILY 90 capsule 11  . glucose blood (ONE TOUCH ULTRA TEST) test strip TEST BLOOD SUGAR LEVELS THREE TIMES DAILY 100 each 5  . HYDROcodone-acetaminophen (NORCO/VICODIN) 5-325 MG per tablet Take 1 tablet by mouth every 6 (six) hours as needed for moderate pain. 15 tablet 0  . ibandronate (BONIVA) 150 MG tablet TAKE 1 TABLET EVERY MONTH ON THE SAME DAY 1 tablet 1  . insulin NPH-regular Human (NOVOLIN 70/30) (70-30) 100 UNIT/ML injection Inject 30 Units into the skin 2 (two) times daily with a meal.     . Insulin Syringe-Needle U-100 (RELION INSULIN SYR 0.5CC/30G) 30G X 5/16" 0.5 ML MISC USE TO TEST BLOOD SUGAR LEVELS THREE TIMES DAILY 100 each 3  . ipratropium (ATROVENT) 0.02 % nebulizer solution Take 2.5 mLs (500 mcg total) by nebulization 4 (four) times daily. 75 mL 11  . isosorbide mononitrate (IMDUR)  30 MG 24 hr tablet TAKE 1 TABLET DAILY 90 tablet 1  . losartan (COZAAR) 50 MG tablet TAKE ONE TABLET BY MOUTH ONCE DAILY 30 tablet 3  . nitroGLYCERIN (NITROSTAT) 0.4 MG SL tablet Place 1 tablet (0.4 mg total) under the tongue every 5 (five) minutes as needed. May repeat x3 25 tablet 6  . omeprazole (PRILOSEC) 20 MG capsule TAKE ONE CAPSULE BY MOUTH ONCE DAILY 30 MINUTES BEFORE A MEAL 30 capsule 3  . ONE TOUCH LANCETS MISC Check blood sugars two times daily 100 each 6  . pioglitazone (ACTOS) 15 MG tablet Take 1 tablet (15 mg total) by mouth daily. 30 tablet 6  . simvastatin (ZOCOR) 80 MG tablet TAKE ONE TABLET AT BEDTIME 30 tablet 3  . traMADol (ULTRAM) 50 MG tablet TAKE  (1)  TABLET  EVERY EIGHT HOURS AS NEEDED FOR PAIN.    - MAY MAKE DROWSY - 90 tablet 2   . vitamin B-12 (CYANOCOBALAMIN) 500 MCG tablet Take 500 mcg by mouth daily.     . vitamin C (ASCORBIC ACID) 500 MG tablet Take 500 mg by mouth daily.    Marland Kitchen. levofloxacin (LEVAQUIN) 250 MG tablet Take 1 tablet (250 mg total) by mouth daily. 10 tablet 0  . predniSONE (DELTASONE) 20 MG tablet 2 tabs po qd x 5d, then 1 tab po qd x 5d, then 1/2 tab po qd x 6d 18 tablet 0  . Probiotic Product (PROBIOTIC DAILY PO) Take 1 tablet by mouth daily.      No facility-administered medications prior to visit.    PE: Blood pressure 159/72, pulse 68, temperature 97 F (36.1 C), temperature source Temporal, resp. rate 18, height 5' 4.5" (1.638 m), weight 233 lb (105.688 kg), SpO2 97 %. 2L oxygen [place] Gen: alert, oriented x 4, NAD. CV: distant S1 and S2, regular LUNGS: distant BS, prolonged exp phase with diffuse exp wheezing, nonlabored resps. LEGS: trace pitting edema bilat. Right ankle with minimal STS around medial and lateral malleolus but no erythema or excessive warmth.  ROM intact but mildly painful.  Laxity normal.  Calf squeeze elicited mild pain in distal LL medial aspect. TTP over medial malleolus and the soft tissues around it.  No calcaneal or achilles tenderness. No lateral ankle tenderness.    IMPRESSION AND PLAN:  1) COPD exacerbation, viral URI. Depo-medrol 80mg  IM in office today. Pred taper: 40 qd x 3d, 20 qd x 3d, 10qd x 4d (starting tomorrow). Z-pack. Albuterol nebs q4h prn. Continue 2L oxygen as per her normal.  2) Right ankle sprain, with bony tenderness.  Will x-ray ankle. Ankle lace up support applied in office today. Pt noted her pain med for arthritis does not work as well "as it used to". She takes 1 tramadol q8h prn + 1 lodine bid + tylenol. I told her she could take 2 tramadol tabs at a time if she wanted to.  No new rx given today.  An After Visit Summary was printed and given to the patient.    FOLLOW UP: 1 wk f/u COPD and ankle.  If ankle not much improved she  needs to get into PT.

## 2014-03-19 ENCOUNTER — Ambulatory Visit (HOSPITAL_COMMUNITY)
Admission: RE | Admit: 2014-03-19 | Discharge: 2014-03-19 | Disposition: A | Discharge status: Home / Self Care | Payer: PPO | Source: Ambulatory Visit | Attending: Family Medicine | Admitting: Family Medicine

## 2014-03-19 DIAGNOSIS — M25571 Pain in right ankle and joints of right foot: Secondary | ICD-10-CM | POA: Diagnosis present

## 2014-03-19 DIAGNOSIS — S82891A Other fracture of right lower leg, initial encounter for closed fracture: Secondary | ICD-10-CM | POA: Insufficient documentation

## 2014-03-19 DIAGNOSIS — Y939 Activity, unspecified: Secondary | ICD-10-CM | POA: Insufficient documentation

## 2014-03-19 DIAGNOSIS — S93401A Sprain of unspecified ligament of right ankle, initial encounter: Secondary | ICD-10-CM

## 2014-03-24 ENCOUNTER — Encounter: Payer: Self-pay | Admitting: Family Medicine

## 2014-03-24 ENCOUNTER — Ambulatory Visit (INDEPENDENT_AMBULATORY_CARE_PROVIDER_SITE_OTHER): Payer: PPO | Admitting: Family Medicine

## 2014-03-24 ENCOUNTER — Other Ambulatory Visit: Payer: Self-pay | Admitting: Family Medicine

## 2014-03-24 VITALS — BP 153/79 | HR 66 | Temp 97.2°F | Resp 22 | Ht 64.5 in | Wt 236.0 lb

## 2014-03-24 DIAGNOSIS — J441 Chronic obstructive pulmonary disease with (acute) exacerbation: Secondary | ICD-10-CM

## 2014-03-24 MED ORDER — PREDNISONE 20 MG PO TABS: ORAL_TABLET | ORAL | Status: DC

## 2014-03-24 NOTE — Progress Notes (Signed)
OFFICE NOTE  03/24/2014  CC:  Chief Complaint  Patient presents with  . Follow-up   HPI: Patient is a 74 y.o. Caucasian female who is here for 9d COPD f/u and right ankle sprain f/u. Feeling better but not at baseline.  She is finshed with her steroid taper (10d) and finished abx.  Has been staying inside with the weather so cold. Most recent albut/atr neb was 4 hours ago.  Usually takes albut/atro neb qid even on her "typical days" and not in a flare. Takes pulmicort 0.25mg  neb bid.  Dr. Kriste Basque sees her for her lung disease. Right ankle still bothering her the same, now describes it as burning and she thinks it may be diabetic PN pain. ROS: no fever, no chest pain.  Pertinent PMH:  Past medical, surgical, social, and family history reviewed and no changes are noted since last office visit.  MEDS:  Outpatient Prescriptions Prior to Visit  Medication Sig Dispense Refill  . albuterol (PROVENTIL HFA;VENTOLIN HFA) 108 (90 BASE) MCG/ACT inhaler Inhale 2 puffs into the lungs every 6 (six) hours as needed for wheezing or shortness of breath. 1 Inhaler 6  . albuterol (PROVENTIL) (2.5 MG/3ML) 0.083% nebulizer solution Take 3 mLs (2.5 mg total) by nebulization 4 (four) times daily. 75 mL 11  . ALPRAZolam (XANAX) 0.5 MG tablet TAKE 1/2 TO 1 TABLET THREE TIMES DAILY AS NEEDED 90 tablet 5  . aspirin 81 MG tablet Take 81 mg by mouth daily.    Marland Kitchen atenolol (TENORMIN) 50 MG tablet TAKE 1 TABLET ONCE A DAY 30 tablet 3  . budesonide (PULMICORT) 0.25 MG/2ML nebulizer solution Take 2 mLs (0.25 mg total) by nebulization 2 (two) times daily. 60 mL 11  . calcium-vitamin D (OSCAL WITH D) 500-200 MG-UNIT per tablet Take 1 tablet by mouth 2 (two) times daily.      . Cholecalciferol (VITAMIN D3) 1000 UNITS CAPS Take 1 capsule by mouth daily.     Marland Kitchen dextromethorphan-guaiFENesin (MUCINEX DM) 30-600 MG per 12 hr tablet Take 2 tablets by mouth every 12 (twelve) hours.      Marland Kitchen etodolac (LODINE) 400 MG tablet TAKE ONE  TABLET BY MOUTH EVERY 12 HOURS 60 tablet 3  . ferrous sulfate 325 (65 FE) MG tablet Take 325 mg by mouth daily with breakfast.     . fluticasone (FLONASE) 50 MCG/ACT nasal spray 2 SPRAYS IN EACH NOSTRIL ONCE A DAY 16 g 3  . furosemide (LASIX) 40 MG tablet TAKE ONE TABLET BY MOUTH ONCE DAILY 30 tablet 3  . gabapentin (NEURONTIN) 300 MG capsule TAKE ONE CAPSULE BY MOUTH THREE TIMES DAILY 90 capsule 11  . HUMULIN 70/30 (70-30) 100 UNIT/ML injection INJECT 25 UNITS SUB-Q 2 TIMES A DAY (MORNING AND EVENING) 20 mL 3  . HYDROcodone-acetaminophen (NORCO/VICODIN) 5-325 MG per tablet Take 1 tablet by mouth every 6 (six) hours as needed for moderate pain. 15 tablet 0  . ibandronate (BONIVA) 150 MG tablet TAKE 1 TABLET EVERY MONTH ON THE SAME DAY 1 tablet 1  . Insulin Syringe-Needle U-100 (RELION INSULIN SYR 0.5CC/30G) 30G X 5/16" 0.5 ML MISC USE TO TEST BLOOD SUGAR LEVELS THREE TIMES DAILY 100 each 3  . ipratropium (ATROVENT) 0.02 % nebulizer solution Take 2.5 mLs (500 mcg total) by nebulization 4 (four) times daily. 75 mL 11  . isosorbide mononitrate (IMDUR) 30 MG 24 hr tablet TAKE 1 TABLET DAILY 90 tablet 1  . losartan (COZAAR) 50 MG tablet TAKE ONE TABLET BY MOUTH ONCE DAILY 30  tablet 3  . nitroGLYCERIN (NITROSTAT) 0.4 MG SL tablet Place 1 tablet (0.4 mg total) under the tongue every 5 (five) minutes as needed. May repeat x3 25 tablet 6  . omeprazole (PRILOSEC) 20 MG capsule TAKE ONE CAPSULE BY MOUTH ONCE DAILY 30 MINUTES BEFORE A MEAL 30 capsule 3  . pioglitazone (ACTOS) 15 MG tablet Take 1 tablet (15 mg total) by mouth daily. 30 tablet 6  . Probiotic Product (PROBIOTIC DAILY PO) Take 1 tablet by mouth daily.     . simvastatin (ZOCOR) 80 MG tablet TAKE ONE TABLET AT BEDTIME 30 tablet 3  . traMADol (ULTRAM) 50 MG tablet TAKE  (1)  TABLET  EVERY EIGHT HOURS AS NEEDED FOR PAIN.    - MAY MAKE DROWSY - 90 tablet 2  . vitamin B-12 (CYANOCOBALAMIN) 500 MCG tablet Take 500 mcg by mouth daily.     . vitamin C  (ASCORBIC ACID) 500 MG tablet Take 500 mg by mouth daily.    Marland Kitchen. glucose blood (ONE TOUCH ULTRA TEST) test strip TEST BLOOD SUGAR LEVELS THREE TIMES DAILY 100 each 5  . ONE TOUCH LANCETS MISC Check blood sugars two times daily 100 each 6  . azithromycin (ZITHROMAX) 250 MG tablet 2 tabs po qd x 1d, then 1 tab po qd x 4d 6 tablet 0  . predniSONE (DELTASONE) 20 MG tablet 2 tabs po qd x 3d, then 1 tab po qd x 3d, then 1/2 tab po qd x 4d 11 tablet 0   No facility-administered medications prior to visit.    PE: Blood pressure 153/79, pulse 66, temperature 97.2 F (36.2 C), temperature source Temporal, resp. rate 22, height 5' 4.5" (1.638 m), weight 236 lb (107.049 kg), SpO2 97 %. Gen: Alert, well appearing.  Patient is oriented to person, place, time, and situation. Wearing oxygen via nasal cannulae CV: RRR, distant S1 and S2.  No murmur or rub. LUNGS: diffuse end exp wheezing with prolonged exp phase and diminished aeration on insp and exp Nonlabored resps  IMPRESSION AND PLAN:  COPD exacerbation, improved but needs more systemic steroids. Prednisone 40mg  qd x 3d, then 20mg  qd x 3d, then 10mg  qd x 4d again.  An After Visit Summary was printed and given to the patient.  FOLLOW UP: 10d

## 2014-03-24 NOTE — Progress Notes (Signed)
Pre visit review using our clinic review tool, if applicable. No additional management support is needed unless otherwise documented below in the visit note. 

## 2014-04-02 ENCOUNTER — Ambulatory Visit (INDEPENDENT_AMBULATORY_CARE_PROVIDER_SITE_OTHER): Payer: PPO | Admitting: Family Medicine

## 2014-04-02 ENCOUNTER — Encounter: Payer: Self-pay | Admitting: Family Medicine

## 2014-04-02 VITALS — BP 151/85 | HR 64 | Temp 97.0°F | Resp 20 | Ht 64.5 in | Wt 228.0 lb

## 2014-04-02 DIAGNOSIS — Z794 Long term (current) use of insulin: Secondary | ICD-10-CM

## 2014-04-02 DIAGNOSIS — E118 Type 2 diabetes mellitus with unspecified complications: Secondary | ICD-10-CM

## 2014-04-02 DIAGNOSIS — E1165 Type 2 diabetes mellitus with hyperglycemia: Secondary | ICD-10-CM | POA: Insufficient documentation

## 2014-04-02 DIAGNOSIS — R600 Localized edema: Secondary | ICD-10-CM

## 2014-04-02 DIAGNOSIS — J449 Chronic obstructive pulmonary disease, unspecified: Secondary | ICD-10-CM

## 2014-04-02 MED ORDER — INSULIN ISOPHANE & REGULAR (HUMAN 70-30)100 UNIT/ML KWIKPEN: PEN_INJECTOR | SUBCUTANEOUS | Status: DC

## 2014-04-02 NOTE — Progress Notes (Signed)
OFFICE NOTE  04/02/2014  CC:  Chief Complaint  Patient presents with  . Follow-up     HPI: Patient is a 74 y.o. Caucasian female who is here for 9 day f/u COPD exacerbation. Last visit I gave a 2nd prednisone taper (see med list below for dosing).  Prior to that she had 10d prednisone taper + abx and had improved but not to a sufficient degree. She happily reports feeling well, back to baseline resp status, on 2 L oxygen via nasal cannulae 24/7.  She also happily reports that she quit drinking diet [place] and has noted the LE swelling she usually has had resolved!  Plus, her wt is down 8 lbs from last visit.  Pertinent PMH:  Past medical, surgical, social, and family history reviewed and no changes are noted since last office visit.  MEDS:  Outpatient Prescriptions Prior to Visit  Medication Sig Dispense Refill  . albuterol (PROVENTIL HFA;VENTOLIN HFA) 108 (90 BASE) MCG/ACT inhaler Inhale 2 puffs into the lungs every 6 (six) hours as needed for wheezing or shortness of breath. 1 Inhaler 6  . albuterol (PROVENTIL) (2.5 MG/3ML) 0.083% nebulizer solution Take 3 mLs (2.5 mg total) by nebulization 4 (four) times daily. 75 mL 11  . ALPRAZolam (XANAX) 0.5 MG tablet TAKE 1/2 TO 1 TABLET THREE TIMES DAILY AS NEEDED 90 tablet 5  . aspirin 81 MG tablet Take 81 mg by mouth daily.    Marland Kitchen. atenolol (TENORMIN) 50 MG tablet TAKE 1 TABLET ONCE A DAY 30 tablet 3  . budesonide (PULMICORT) 0.25 MG/2ML nebulizer solution Take 2 mLs (0.25 mg total) by nebulization 2 (two) times daily. 60 mL 11  . calcium-vitamin D (OSCAL WITH D) 500-200 MG-UNIT per tablet Take 1 tablet by mouth 2 (two) times daily.      . Cholecalciferol (VITAMIN D3) 1000 UNITS CAPS Take 1 capsule by mouth daily.     Marland Kitchen. dextromethorphan-guaiFENesin (MUCINEX DM) 30-600 MG per 12 hr tablet Take 2 tablets by mouth every 12 (twelve) hours.      Marland Kitchen. etodolac (LODINE) 400 MG tablet TAKE ONE TABLET BY MOUTH EVERY 12 HOURS 60 tablet 3  . ferrous  sulfate 325 (65 FE) MG tablet Take 325 mg by mouth daily with breakfast.     . fluticasone (FLONASE) 50 MCG/ACT nasal spray 2 SPRAYS IN EACH NOSTRIL ONCE A DAY 16 g 3  . furosemide (LASIX) 40 MG tablet TAKE ONE TABLET BY MOUTH ONCE DAILY 30 tablet 3  . gabapentin (NEURONTIN) 300 MG capsule TAKE ONE CAPSULE BY MOUTH THREE TIMES DAILY 90 capsule 11  . glucose blood (ONE TOUCH ULTRA TEST) test strip TEST BLOOD SUGAR LEVELS THREE TIMES DAILY 100 each 5  . HUMULIN 70/30 (70-30) 100 UNIT/ML injection INJECT 25 UNITS SUB-Q 2 TIMES A DAY (MORNING AND EVENING) 20 mL 3  . HYDROcodone-acetaminophen (NORCO/VICODIN) 5-325 MG per tablet Take 1 tablet by mouth every 6 (six) hours as needed for moderate pain. 15 tablet 0  . ibandronate (BONIVA) 150 MG tablet TAKE 1 TABLET EVERY MONTH ON THE SAME DAY 1 tablet 1  . ipratropium (ATROVENT) 0.02 % nebulizer solution Take 2.5 mLs (500 mcg total) by nebulization 4 (four) times daily. 75 mL 11  . isosorbide mononitrate (IMDUR) 30 MG 24 hr tablet TAKE 1 TABLET DAILY 90 tablet 1  . losartan (COZAAR) 50 MG tablet TAKE ONE TABLET BY MOUTH ONCE DAILY 30 tablet 3  . nitroGLYCERIN (NITROSTAT) 0.4 MG SL tablet Place 1 tablet (0.4 mg  total) under the tongue every 5 (five) minutes as needed. May repeat x3 25 tablet 6  . omeprazole (PRILOSEC) 20 MG capsule TAKE ONE CAPSULE BY MOUTH ONCE DAILY 30 MINUTES BEFORE A MEAL 30 capsule 3  . ONE TOUCH LANCETS MISC Check blood sugars two times daily 100 each 6  . pioglitazone (ACTOS) 15 MG tablet Take 1 tablet (15 mg total) by mouth daily. 30 tablet 6  . predniSONE (DELTASONE) 20 MG tablet 2 tabs po qd x 3d, then 1 tab po qd x 3d, then 1/2 tab po qd x 4d 11 tablet 0  . Probiotic Product (PROBIOTIC DAILY PO) Take 1 tablet by mouth daily.     . simvastatin (ZOCOR) 80 MG tablet TAKE ONE TABLET AT BEDTIME 30 tablet 3  . traMADol (ULTRAM) 50 MG tablet TAKE  (1)  TABLET  EVERY EIGHT HOURS AS NEEDED FOR PAIN.    - MAY MAKE DROWSY - 90 tablet 2  .  TRUEPLUS INSULIN SYRINGE 30G X 5/16" 0.5 ML MISC USE 3 TIMES A DAY AS DIRECTED 100 each 6  . vitamin B-12 (CYANOCOBALAMIN) 500 MCG tablet Take 500 mcg by mouth daily.     . vitamin C (ASCORBIC ACID) 500 MG tablet Take 500 mg by mouth daily.     No facility-administered medications prior to visit.    PE: Blood pressure 151/85, pulse 64, temperature 97 F (36.1 C), temperature source Temporal, resp. rate 20, height 5' 4.5" (1.638 m), weight 228 lb (103.42 kg), SpO2 96 %. Gen: Alert, well appearing.  Patient is oriented to person, place, time, and situation. CV: RRR, distant S1 and S2.  No audible murmur or rub.  Rare ectopic beat. LUNGS: distant BS in all fields, no adventitious breath sounds, mild prolongation of exp phase, nonlabored resps EXT: no clubbing, cyanosis, or edema.    IMPRESSION AND PLAN:  1) COPD exacerbation; resolved. Continue current controller/daily treatments for COPD + oxygen.  2) LE edema; resolved with cut back in ingestion of Na intake.  3) DM 2, insurer mandates switch to humulin 70/30 PENS instead of vials so I'll change her rx today.  Spent 25 min with pt today, with >50% of this time spent in counseling and care coordination regarding the above problems.  An After Visit Summary was printed and given to the patient.  FOLLOW UP: keep appt set for 05/17/14

## 2014-04-02 NOTE — Progress Notes (Signed)
Pre visit review using our clinic review tool, if applicable. No additional management support is needed unless otherwise documented below in the visit note. 

## 2014-04-06 ENCOUNTER — Other Ambulatory Visit: Payer: Self-pay | Admitting: Family Medicine

## 2014-05-04 ENCOUNTER — Encounter: Payer: Self-pay | Admitting: Cardiology

## 2014-05-04 ENCOUNTER — Ambulatory Visit (INDEPENDENT_AMBULATORY_CARE_PROVIDER_SITE_OTHER): Payer: PPO | Admitting: Cardiology

## 2014-05-04 VITALS — BP 140/80 | HR 65 | Ht 64.5 in | Wt 235.6 lb

## 2014-05-04 DIAGNOSIS — I10 Essential (primary) hypertension: Secondary | ICD-10-CM

## 2014-05-04 DIAGNOSIS — I2510 Atherosclerotic heart disease of native coronary artery without angina pectoris: Secondary | ICD-10-CM

## 2014-05-04 DIAGNOSIS — J449 Chronic obstructive pulmonary disease, unspecified: Secondary | ICD-10-CM

## 2014-05-04 DIAGNOSIS — I5033 Acute on chronic diastolic (congestive) heart failure: Secondary | ICD-10-CM

## 2014-05-04 MED ORDER — TORSEMIDE 20 MG PO TABS: 40.0000 mg | ORAL_TABLET | Freq: Every day | ORAL | Status: DC

## 2014-05-04 NOTE — Progress Notes (Signed)
Cardiology Office Note  Date: 05/04/2014   ID: Breindel, Collier [birth year], MRN [id number]  PCP: Jeoffrey Massed, MD  Primary Cardiologist: Nona Dell, MD   Chief Complaint  Patient presents with  . Coronary Artery Disease  . Hypertension  . Hyperlipidemia    History of Present Illness: [name] is a 74 y.o. female last seen in July 2015. Interval follow-up noted with Dr. Milinda Cave. She is here with a family member today for a routine cardiac visit. Fortunately, no angina symptoms are reported. She remains chronically short of breath with interval exacerbations of COPD, uses oxygen regularly. She also complains of swelling in her abdomen down into her legs, weight is up compared to the last visit. She reports compliance with Lasix.  Follow-up tracing is reviewed below.  I did review her remaining cardiac medications. She continues on aspirin, Tenormin, Imdur, Cozaar, and Zocor.   Past Medical History  Diagnosis Date  . COPD (chronic obstructive pulmonary disease)     Oxygen prn ambulation  . Hyperlipidemia   . Essential hypertension, benign   . Anemia   . Anxiety   . Arthritis     L-spine, hips, knees, elbows, wrists, hands  . Coronary atherosclerosis of native coronary artery     PTCA/BMS LAD 2000, PTCA/brachytherapy LAD 2001, LVEF 55%.  CABG  . Type 2 diabetes mellitus   . Osteoporosis   . Venous insufficiency   . Obesity   . Myocardial infarction     AMI 2000  . Asthma   . Sleep apnea     Not on CPAP  . DDD (degenerative disc disease), lumbar   . Herpes zoster 06/2013    Past Surgical History  Procedure Laterality Date  . Total abdominal hysterectomy      DUB (non-malignant reasons).  No hx of abnormal pap smears.  . Cholecystectomy    . Rotator cuff repair Right   . Cataract extraction w/phaco Left 06/30/2012    Procedure: CATARACT EXTRACTION PHACO AND INTRAOCULAR LENS PLACEMENT (IOC);  Surgeon: Gemma Payor, MD;  Location: [facility];  Service:  Ophthalmology;  Laterality: Left;  CDE:12.32  . Cardiac catheterization  07/2012    + progressive CAD but no lesions to intervene upon, EF 65%; med mgmt continued  . Transthoracic echocardiogram  06/2013    EF 40%, + WMA  . Coronary angioplasty with stent placement      PTCA/BMS LAD 2000, PTCA/brachytherapy LAD 2001, LVEF 55%     Current Outpatient Prescriptions  Medication Sig Dispense Refill  . albuterol (PROVENTIL HFA;VENTOLIN HFA) 108 (90 BASE) MCG/ACT inhaler Inhale 2 puffs into the lungs every 6 (six) hours as needed for wheezing or shortness of breath. 1 Inhaler 6  . albuterol (PROVENTIL) (2.5 MG/3ML) 0.083% nebulizer solution Take 3 mLs (2.5 mg total) by nebulization 4 (four) times daily. 75 mL 11  . ALPRAZolam (XANAX) 0.5 MG tablet TAKE 1/2 TO 1 TABLET THREE TIMES DAILY AS NEEDED 90 tablet 5  . aspirin 81 MG tablet Take 81 mg by mouth daily.    Marland Kitchen atenolol (TENORMIN) 50 MG tablet TAKE 1 TABLET ONCE A DAY 30 tablet 3  . budesonide (PULMICORT) 0.25 MG/2ML nebulizer solution Take 2 mLs (0.25 mg total) by nebulization 2 (two) times daily. 60 mL 11  . calcium-vitamin D (OSCAL WITH D) 500-200 MG-UNIT per tablet Take 1 tablet by mouth 2 (two) times daily.      . Cholecalciferol (VITAMIN D3) 1000 UNITS CAPS Take 1 capsule by  mouth daily.     Marland Kitchen. dextromethorphan-guaiFENesin (MUCINEX DM) 30-600 MG per 12 hr tablet Take 2 tablets by mouth every 12 (twelve) hours.      Marland Kitchen. etodolac (LODINE) 400 MG tablet TAKE ONE TABLET BY MOUTH EVERY 12 HOURS 60 tablet 3  . ferrous sulfate 325 (65 FE) MG tablet Take 325 mg by mouth daily with breakfast.     . fluticasone (FLONASE) 50 MCG/ACT nasal spray 2 SPRAYS IN EACH NOSTRIL ONCE A DAY 16 g 3  . gabapentin (NEURONTIN) 300 MG capsule TAKE ONE CAPSULE BY MOUTH THREE TIMES DAILY 90 capsule 11  . glucose blood (ONE TOUCH ULTRA TEST) test strip TEST BLOOD SUGAR LEVELS THREE TIMES DAILY 100 each 5  . HYDROcodone-acetaminophen (NORCO/VICODIN) 5-325 MG per tablet Take 1  tablet by mouth every 6 (six) hours as needed for moderate pain. 15 tablet 0  . ibandronate (BONIVA) 150 MG tablet TAKE 1 TABLET EVERY MONTH ON THE SAME DAY 1 tablet 1  . Insulin Isophane & Regular Human (HUMULIN 70/30 KWIKPEN) (70-30) 100 UNIT/ML PEN 25 U SQ bid 15 mL 11  . ipratropium (ATROVENT) 0.02 % nebulizer solution Take 2.5 mLs (500 mcg total) by nebulization 4 (four) times daily. 75 mL 11  . isosorbide mononitrate (IMDUR) 30 MG 24 hr tablet TAKE 1 TABLET DAILY 90 tablet 1  . losartan (COZAAR) 50 MG tablet TAKE ONE TABLET BY MOUTH ONCE DAILY 30 tablet 3  . nitroGLYCERIN (NITROSTAT) 0.4 MG SL tablet Place 1 tablet (0.4 mg total) under the tongue every 5 (five) minutes as needed. May repeat x3 25 tablet 6  . omeprazole (PRILOSEC) 20 MG capsule TAKE ONE CAPSULE BY MOUTH ONCE DAILY 30 MINUTES BEFORE A MEAL 30 capsule 3  . ONE TOUCH LANCETS MISC Check blood sugars two times daily 100 each 6  . pioglitazone (ACTOS) 15 MG tablet TAKE 1 TABLET DAILY 30 tablet 3  . predniSONE (DELTASONE) 20 MG tablet 2 tabs po qd x 3d, then 1 tab po qd x 3d, then 1/2 tab po qd x 4d 11 tablet 0  . Probiotic Product (PROBIOTIC DAILY PO) Take 1 tablet by mouth daily.     . simvastatin (ZOCOR) 80 MG tablet TAKE ONE TABLET AT BEDTIME 30 tablet 3  . traMADol (ULTRAM) 50 MG tablet TAKE  (1)  TABLET  EVERY EIGHT HOURS AS NEEDED FOR PAIN.    - MAY MAKE DROWSY - 90 tablet 2  . TRUEPLUS INSULIN SYRINGE 30G X 5/16" 0.5 ML MISC USE 3 TIMES A DAY AS DIRECTED 100 each 6  . vitamin B-12 (CYANOCOBALAMIN) 500 MCG tablet Take 500 mcg by mouth daily.     . vitamin C (ASCORBIC ACID) 500 MG tablet Take 500 mg by mouth daily.    Marland Kitchen. torsemide (DEMADEX) 20 MG tablet Take 2 tablets (40 mg total) by mouth daily. 60 tablet 3   No current facility-administered medications for this visit.    Allergies:  Codeine and Terbutaline sulfate   Social History: The patient  reports that she quit smoking about 16 years ago. Her smoking use included  Cigarettes. She has a 30 pack-year smoking history. She does not have any smokeless tobacco history on file. She reports that she does not drink alcohol or use illicit drugs.   ROS:  Please see the history of present illness. Otherwise, complete review of systems is positive for NYHA class III dyspnea, interval wheezing.  All other systems are reviewed and negative.    Physical Exam:  VS:  BP 140/80 mmHg  Pulse 65  Ht 5' 4.5" (1.638 m)  Wt 235 lb 9.6 oz (106.867 kg)  BMI 39.83 kg/m2  SpO2 97%, BMI Body mass index is 39.83 kg/(m^2).  Wt Readings from Last 3 Encounters:  05/04/14 235 lb 9.6 oz (106.867 kg)  04/02/14 228 lb (103.42 kg)  03/24/14 236 lb (107.049 kg)     No acute distress. Wearing oxygen via nasal cannula.  HEENT: Conjunctiva and lids normal, oropharynx with moist mucosa.  Neck: Supple, no elevated JVP or bruits.  Lungs: Diminished breath sounds throughout, prolonged expiratory phase, nonlabored.  Cardiac: Regular rate and rhythm with occasional ectopic beat, no S3 gallop or rub.  Abdomen: Protuberant, nontender, bowel sounds present.  Skin: Warm and dry.  Extremities: 2+ leg edema bilaterally. Musculoskeletal: No kyphosis. Neuropsychiatric: Alert and oriented 3, affect appropriate.   ECG: ECG is ordered today and reviewed showing normal sinus rhythm with IVCD, left anterior fascicular block.  Recent Labwork: 06/02/2013: ALT 17; AST 15 01/27/2014: BUN 22; Creatinine 1.15*; Hemoglobin 13.7; Platelets 271; Potassium 4.7; Sodium 141     Component Value Date/Time   CHOL 167 09/21/2013 0951   TRIG 376.0* 09/21/2013 0951   HDL 36.20* 09/21/2013 0951   CHOLHDL 5 09/21/2013 0951   VLDL 75.2* 09/21/2013 0951   LDLCALC 56 09/21/2013 0951   LDLDIRECT 73.9 07/16/2012 1054    Other Studies Reviewed Today:  Cardiac catheterization in May 2014 demonstrated 50-75% stenoses within the proximal to mid LAD, occluded diagonal, no significant circumflex distribution  disease, mild RCA disease, patent SVG to diagonal with 30-40% distal anastomotic stenosis, patent LIMA to LAD, LVEF 65%. Based on this anatomy, medical therapy was recommended.  ASSESSMENT AND PLAN:  1. Probable acute on chronic diastolic heart failure, although may also be related to cor pulmonale with significant lung disease. Plan is to change from Lasix to Demadex 40 mg daily to see if this provides better diuresis and volume control. Follow-up BMET in a few weeks.  2. Multivessel CAD status post CABG, continue medical therapy. Most recent cardiac catheterization results noted above. No progressive angina symptoms.  3. Essential hypertension, no other changes to current regimen.  4. Oxygen-dependent COPD with probable component of cor pulmonale. Keep follow-up with primary care provider.  Current medicines are reviewed at length with the patient today.  The patient does not have concerns regarding medicines.   Orders Placed This Encounter  Procedures  . Basic Metabolic Panel (BMET)  . EKG 12-Lead    Disposition: FU with me in 6 months.   Signed, Jonelle Sidle, MD, [facility] 05/04/2014 12:37 PM    [facility] at [facility] [address], [place], [place] [address] Phone: [phone]; Fax: [phone]

## 2014-05-04 NOTE — Patient Instructions (Addendum)
Your physician wants you to follow-up in: 6 months with DrMcDowell You will receive a reminder letter in the mail two months in advance. If you don't receive a letter, please call our office to schedule the follow-up appointment.    STOP Lasix    START Demadex 40 mg daily    Please get lab work in 2 weeks (BMET)

## 2014-05-05 ENCOUNTER — Encounter: Payer: Self-pay | Admitting: Family Medicine

## 2014-05-05 ENCOUNTER — Other Ambulatory Visit: Payer: Self-pay | Admitting: Family Medicine

## 2014-05-05 NOTE — Telephone Encounter (Signed)
Pt requesting rf of tramadol.  Pt last OV was 04/02/14.  Last Rx was 02/15/14 x 2 rfs.  Please advise.

## 2014-05-07 ENCOUNTER — Ambulatory Visit: Payer: Medicare Other | Admitting: Family Medicine

## 2014-05-17 ENCOUNTER — Ambulatory Visit (INDEPENDENT_AMBULATORY_CARE_PROVIDER_SITE_OTHER): Payer: PPO | Admitting: Family Medicine

## 2014-05-17 ENCOUNTER — Encounter: Payer: Self-pay | Admitting: Family Medicine

## 2014-05-17 VITALS — BP 117/69 | HR 63 | Temp 97.6°F | Resp 16 | Ht 64.5 in | Wt 235.0 lb

## 2014-05-17 DIAGNOSIS — J441 Chronic obstructive pulmonary disease with (acute) exacerbation: Secondary | ICD-10-CM

## 2014-05-17 DIAGNOSIS — I255 Ischemic cardiomyopathy: Secondary | ICD-10-CM

## 2014-05-17 DIAGNOSIS — Z23 Encounter for immunization: Secondary | ICD-10-CM

## 2014-05-17 MED ORDER — FUROSEMIDE 40 MG PO TABS: 40.0000 mg | ORAL_TABLET | Freq: Every day | ORAL | Status: DC

## 2014-05-17 MED ORDER — AZITHROMYCIN 250 MG PO TABS: ORAL_TABLET | ORAL | Status: DC

## 2014-05-17 MED ORDER — PREDNISONE 20 MG PO TABS: ORAL_TABLET | ORAL | Status: DC

## 2014-05-17 NOTE — Progress Notes (Signed)
Pre visit review using our clinic review tool, if applicable. No additional management support is needed unless otherwise documented below in the visit note. 

## 2014-05-17 NOTE — Progress Notes (Signed)
OFFICE NOTE  05/17/2014  CC:  Chief Complaint  Patient presents with  . Follow-up  . Nasal Congestion  . Edema   HPI: Patient is a 74 y.o. Caucasian female who is here for routine DM 2 f/u. However, she is sick today so we did acute illness only.  Started about 7 days ago, nasal congestion/PND, coughing some-usually nonproductive, Feeling SOB.  Feels bloated more lately.  No fevers.  Scratchy throat but not sore.  No HA.  Says the demadex that her cardiologist changed her to is not helping any better than lasix PLUS her insurance does not cover it, so she wants to go back to her furosemide 40mg  qd.  Pertinent PMH:  Past medical, surgical, social, and family history reviewed and no changes are noted since last office visit.  MEDS:  Outpatient Prescriptions Prior to Visit  Medication Sig Dispense Refill  . albuterol (PROVENTIL HFA;VENTOLIN HFA) 108 (90 BASE) MCG/ACT inhaler Inhale 2 puffs into the lungs every 6 (six) hours as needed for wheezing or shortness of breath. 1 Inhaler 6  . albuterol (PROVENTIL) (2.5 MG/3ML) 0.083% nebulizer solution Take 3 mLs (2.5 mg total) by nebulization 4 (four) times daily. 75 mL 11  . ALPRAZolam (XANAX) 0.5 MG tablet TAKE 1/2 TO 1 TABLET THREE TIMES DAILY AS NEEDED 90 tablet 5  . aspirin 81 MG tablet Take 81 mg by mouth daily.    Marland Kitchen. atenolol (TENORMIN) 50 MG tablet TAKE 1 TABLET ONCE A DAY 30 tablet 3  . budesonide (PULMICORT) 0.25 MG/2ML nebulizer solution Take 2 mLs (0.25 mg total) by nebulization 2 (two) times daily. 60 mL 11  . calcium-vitamin D (OSCAL WITH D) 500-200 MG-UNIT per tablet Take 1 tablet by mouth 2 (two) times daily.      . Cholecalciferol (VITAMIN D3) 1000 UNITS CAPS Take 1 capsule by mouth daily.     Marland Kitchen. dextromethorphan-guaiFENesin (MUCINEX DM) 30-600 MG per 12 hr tablet Take 2 tablets by mouth every 12 (twelve) hours.      Marland Kitchen. etodolac (LODINE) 400 MG tablet TAKE ONE TABLET BY MOUTH EVERY 12 HOURS 60 tablet 3  . ferrous sulfate 325 (65  FE) MG tablet Take 325 mg by mouth daily with breakfast.     . fluticasone (FLONASE) 50 MCG/ACT nasal spray 2 SPRAYS IN EACH NOSTRIL ONCE A DAY 16 g 3  . glucose blood (ONE TOUCH ULTRA TEST) test strip TEST BLOOD SUGAR LEVELS THREE TIMES DAILY 100 each 5  . HYDROcodone-acetaminophen (NORCO/VICODIN) 5-325 MG per tablet Take 1 tablet by mouth every 6 (six) hours as needed for moderate pain. 15 tablet 0  . ibandronate (BONIVA) 150 MG tablet TAKE 1 TABLET EVERY MONTH ON THE SAME DAY 1 tablet 1  . ipratropium (ATROVENT) 0.02 % nebulizer solution Take 2.5 mLs (500 mcg total) by nebulization 4 (four) times daily. 75 mL 11  . isosorbide mononitrate (IMDUR) 30 MG 24 hr tablet TAKE 1 TABLET DAILY 90 tablet 1  . losartan (COZAAR) 50 MG tablet TAKE ONE TABLET BY MOUTH ONCE DAILY 30 tablet 3  . nitroGLYCERIN (NITROSTAT) 0.4 MG SL tablet Place 1 tablet (0.4 mg total) under the tongue every 5 (five) minutes as needed. May repeat x3 25 tablet 6  . omeprazole (PRILOSEC) 20 MG capsule TAKE ONE CAPSULE BY MOUTH ONCE DAILY 30 MINUTES BEFORE A MEAL 30 capsule 3  . pioglitazone (ACTOS) 15 MG tablet TAKE 1 TABLET DAILY 30 tablet 3  . Probiotic Product (PROBIOTIC DAILY PO) Take 1 tablet by  mouth daily.     . simvastatin (ZOCOR) 80 MG tablet TAKE ONE TABLET AT BEDTIME 30 tablet 3  . torsemide (DEMADEX) 20 MG tablet Take 2 tablets (40 mg total) by mouth daily. 60 tablet 3  . traMADol (ULTRAM) 50 MG tablet 1-2 tabs po q8h prn pain 180 tablet 2  . vitamin B-12 (CYANOCOBALAMIN) 500 MCG tablet Take 500 mcg by mouth daily.     . vitamin C (ASCORBIC ACID) 500 MG tablet Take 500 mg by mouth daily.    Marland Kitchen gabapentin (NEURONTIN) 300 MG capsule TAKE ONE CAPSULE BY MOUTH THREE TIMES DAILY 90 capsule 11  . Insulin Isophane & Regular Human (HUMULIN 70/30 KWIKPEN) (70-30) 100 UNIT/ML PEN 25 U SQ bid 15 mL 11  . ONE TOUCH LANCETS MISC Check blood sugars two times daily 100 each 6  . TRUEPLUS INSULIN SYRINGE 30G X 5/16" 0.5 ML MISC USE 3  TIMES A DAY AS DIRECTED 100 each 6  . predniSONE (DELTASONE) 20 MG tablet 2 tabs po qd x 3d, then 1 tab po qd x 3d, then 1/2 tab po qd x 4d (Patient not taking: Reported on 05/17/2014) 11 tablet 0   No facility-administered medications prior to visit.    PE: Blood pressure 117/69, pulse 63, temperature 97.6 F (36.4 C), temperature source Temporal, resp. rate 16, height 5' 4.5" (1.638 m), weight 235 lb (106.595 kg), SpO2 94 %.2l oxygen nasal cannulae Gen: alert, tired appearing but NAD. ZOX:WRUE: no injection, icteris, swelling, or exudate.  EOMI, PERRLA. Mouth: lips without lesion/swelling.  Oral mucosa pink and moist. Oropharynx without erythema, exudate, or swelling.  CV: RRR, distant S1 and S2, no audible murmur or rub LUNGS: diffusely decreased aeration without crackles, +prolonged exp phase, +soft exp wheezing.  Nonlabored. She is wearing her usual 2L oxygen via [place]. EXT: no pitting edema  LAB:  Lab Results  Component Value Date   HGBA1C 7.9* 12/21/2013   Lab Results  Component Value Date   CHOL 167 09/21/2013   HDL 36.20* 09/21/2013   LDLCALC 56 09/21/2013   LDLDIRECT 73.9 07/16/2012   TRIG 376.0* 09/21/2013   CHOLHDL 5 09/21/2013     Chemistry      Component Value Date/Time   NA 141 01/27/2014 1436   K 4.7 01/27/2014 1436   CL 100 01/27/2014 1436   CO2 34* 01/27/2014 1436   BUN 22 01/27/2014 1436   CREATININE 1.15* 01/27/2014 1436   CREATININE 1.3* 12/21/2013 1350      Component Value Date/Time   CALCIUM 9.7 01/27/2014 1436   ALKPHOS 55 06/02/2013 1245   AST 15 06/02/2013 1245   ALT 17 06/02/2013 1245   BILITOT 0.4 06/02/2013 1245      IMPRESSION AND PLAN:  1) COPD exacerbation; start prednisone  qd x 4d,  qd x 4d, then  qd x 4d. Z-pack rx'd.  Continue albut neb q4h prn. Pneumovax 23 IM today.  2) Ischemic cardiomyopathy; recent trial of change from lasix to demadex not helping per pt, also demadex not covered by pt's insurance.  Will switch  her back to lasix 40 mg po qd.  An After Visit Summary was printed and given to the patient.  FOLLOW UP: 4d f/u COPD flare.

## 2014-05-18 ENCOUNTER — Telehealth: Payer: Self-pay | Admitting: *Deleted

## 2014-05-18 MED ORDER — INSULIN ISOPHANE & REGULAR (HUMAN 70-30)100 UNIT/ML KWIKPEN: PEN_INJECTOR | SUBCUTANEOUS | Status: DC

## 2014-05-18 NOTE — Telephone Encounter (Signed)
Received fax from pharmacy requesting new rx stating that pt takes her Humulin 34 units 2 times a day. This is the amount pt is taking. Please advise?

## 2014-05-18 NOTE — Telephone Encounter (Signed)
OK.  rx sent as per pt request.

## 2014-05-21 ENCOUNTER — Encounter: Payer: Self-pay | Admitting: Family Medicine

## 2014-05-21 ENCOUNTER — Ambulatory Visit (INDEPENDENT_AMBULATORY_CARE_PROVIDER_SITE_OTHER): Payer: PPO | Admitting: Family Medicine

## 2014-05-21 VITALS — BP 145/82 | HR 63 | Temp 97.9°F | Ht 62.0 in | Wt 233.0 lb

## 2014-05-21 DIAGNOSIS — R739 Hyperglycemia, unspecified: Secondary | ICD-10-CM

## 2014-05-21 DIAGNOSIS — T380X5A Adverse effect of glucocorticoids and synthetic analogues, initial encounter: Secondary | ICD-10-CM

## 2014-05-21 DIAGNOSIS — I5022 Chronic systolic (congestive) heart failure: Secondary | ICD-10-CM

## 2014-05-21 DIAGNOSIS — Z23 Encounter for immunization: Secondary | ICD-10-CM

## 2014-05-21 DIAGNOSIS — J441 Chronic obstructive pulmonary disease with (acute) exacerbation: Secondary | ICD-10-CM

## 2014-05-21 MED ORDER — METHYLPREDNISOLONE ACETATE 80 MG/ML IJ SUSP
80.0000 mg | Freq: Once | INTRAMUSCULAR | Status: AC
  Administered 2014-05-21: 80 mg via INTRAMUSCULAR

## 2014-05-21 NOTE — Addendum Note (Signed)
Addended by: Marlene LardMILLER, Hailey Stormer M on: 05/21/2014 11:54 AM   Modules accepted: Orders

## 2014-05-21 NOTE — Progress Notes (Signed)
Pre visit review using our clinic review tool, if applicable. No additional management support is needed unless otherwise documented below in the visit note. 

## 2014-05-21 NOTE — Progress Notes (Signed)
OFFICE NOTE  05/21/2014  CC:  Chief Complaint  Patient presents with  . Follow-up     HPI: Patient is a 74 y.o. Caucasian female who is here for 4 day f/u acute exacerbation of COPD. Taking zithromax and prednisone taper, albuterol neb q4h prn. She is taking mucinex.  Says she feels a bit better, still with a largely nonproductive cough and some SOB.  No fevers.  She is eating/drinking fine.   Pertinent PMH:  Past medical, surgical, social, and family history reviewed and no changes are noted since last office visit.  MEDS:  Outpatient Prescriptions Prior to Visit  Medication Sig Dispense Refill  . albuterol (PROVENTIL HFA;VENTOLIN HFA) 108 (90 BASE) MCG/ACT inhaler Inhale 2 puffs into the lungs every 6 (six) hours as needed for wheezing or shortness of breath. 1 Inhaler 6  . albuterol (PROVENTIL) (2.5 MG/3ML) 0.083% nebulizer solution Take 3 mLs (2.5 mg total) by nebulization 4 (four) times daily. 75 mL 11  . ALPRAZolam (XANAX) 0.5 MG tablet TAKE 1/2 TO 1 TABLET THREE TIMES DAILY AS NEEDED 90 tablet 5  . aspirin 81 MG tablet Take 81 mg by mouth daily.    Marland Kitchen atenolol (TENORMIN) 50 MG tablet TAKE 1 TABLET ONCE A DAY 30 tablet 3  . azithromycin (ZITHROMAX) 250 MG tablet 2 tabs po qd x 1d, then 1 tab po qd x 4d 6 tablet 0  . budesonide (PULMICORT) 0.25 MG/2ML nebulizer solution Take 2 mLs (0.25 mg total) by nebulization 2 (two) times daily. 60 mL 11  . calcium-vitamin D (OSCAL WITH D) 500-200 MG-UNIT per tablet Take 1 tablet by mouth 2 (two) times daily.      . Cholecalciferol (VITAMIN D3) 1000 UNITS CAPS Take 1 capsule by mouth daily.     Marland Kitchen dextromethorphan-guaiFENesin (MUCINEX DM) 30-600 MG per 12 hr tablet Take 2 tablets by mouth every 12 (twelve) hours.      Marland Kitchen etodolac (LODINE) 400 MG tablet TAKE ONE TABLET BY MOUTH EVERY 12 HOURS 60 tablet 3  . ferrous sulfate 325 (65 FE) MG tablet Take 325 mg by mouth daily with breakfast.     . fluticasone (FLONASE) 50 MCG/ACT nasal spray 2  SPRAYS IN EACH NOSTRIL ONCE A DAY 16 g 3  . furosemide (LASIX) 40 MG tablet Take 1 tablet (40 mg total) by mouth daily. 30 tablet 6  . gabapentin (NEURONTIN) 300 MG capsule TAKE ONE CAPSULE BY MOUTH THREE TIMES DAILY 90 capsule 11  . glucose blood (ONE TOUCH ULTRA TEST) test strip TEST BLOOD SUGAR LEVELS THREE TIMES DAILY 100 each 5  . HYDROcodone-acetaminophen (NORCO/VICODIN) 5-325 MG per tablet Take 1 tablet by mouth every 6 (six) hours as needed for moderate pain. 15 tablet 0  . ibandronate (BONIVA) 150 MG tablet TAKE 1 TABLET EVERY MONTH ON THE SAME DAY 1 tablet 1  . Insulin Isophane & Regular Human (HUMULIN 70/30 KWIKPEN) (70-30) 100 UNIT/ML PEN 34 U SQ bid 15 mL 11  . ipratropium (ATROVENT) 0.02 % nebulizer solution Take 2.5 mLs (500 mcg total) by nebulization 4 (four) times daily. 75 mL 11  . isosorbide mononitrate (IMDUR) 30 MG 24 hr tablet TAKE 1 TABLET DAILY 90 tablet 1  . losartan (COZAAR) 50 MG tablet TAKE ONE TABLET BY MOUTH ONCE DAILY 30 tablet 3  . nitroGLYCERIN (NITROSTAT) 0.4 MG SL tablet Place 1 tablet (0.4 mg total) under the tongue every 5 (five) minutes as needed. May repeat x3 25 tablet 6  . omeprazole (PRILOSEC) 20 MG  capsule TAKE ONE CAPSULE BY MOUTH ONCE DAILY 30 MINUTES BEFORE A MEAL 30 capsule 3  . ONE TOUCH LANCETS MISC Check blood sugars two times daily 100 each 6  . pioglitazone (ACTOS) 15 MG tablet TAKE 1 TABLET DAILY 30 tablet 3  . predniSONE (DELTASONE) 20 MG tablet 3 tabs po qd x 4d, then 2 tabs po qd x 4d, then 1 tab po qd x 4d 24 tablet 0  . Probiotic Product (PROBIOTIC DAILY PO) Take 1 tablet by mouth daily.     . simvastatin (ZOCOR) 80 MG tablet TAKE ONE TABLET AT BEDTIME 30 tablet 3  . traMADol (ULTRAM) 50 MG tablet 1-2 tabs po q8h prn pain 180 tablet 2  . TRUEPLUS INSULIN SYRINGE 30G X 5/16" 0.5 ML MISC USE 3 TIMES A DAY AS DIRECTED 100 each 6  . vitamin B-12 (CYANOCOBALAMIN) 500 MCG tablet Take 500 mcg by mouth daily.     . vitamin C (ASCORBIC ACID) 500 MG  tablet Take 500 mg by mouth daily.     No facility-administered medications prior to visit.    PE: Blood pressure 145/82, pulse 63, temperature 97.9 F (36.6 C), temperature source Oral, height 5\' 2"  (1.575 m), weight 233 lb (105.688 kg), SpO2 93 %. 2L oxygen nasal cannulae Alert, tired appearing but pleasant and in NAD.  Lucid thought/speech. CV: RRR, distant S1 and S2.  No murmur audible. LUNGS: CTA on inspiration, with diminished aeration diffusely and prolongation of exp phase, trace end exp wheeze.  Nonlabored resps.  EXT: 2+ pitting edema in both LL's from just below the knee down into feet.  No rash/erythema.  LAB:    Chemistry      Component Value Date/Time   NA 141 01/27/2014 1436   K 4.7 01/27/2014 1436   CL 100 01/27/2014 1436   CO2 34* 01/27/2014 1436   BUN 22 01/27/2014 1436   CREATININE 1.15* 01/27/2014 1436   CREATININE 1.3* 12/21/2013 1350      Component Value Date/Time   CALCIUM 9.7 01/27/2014 1436   ALKPHOS 55 06/02/2013 1245   AST 15 06/02/2013 1245   ALT 17 06/02/2013 1245   BILITOT 0.4 06/02/2013 1245       IMPRESSION AND PLAN:  COPD exacerbation; mild improvement with treatments over the last 4d. Will give depo medrol 80 mg IM today.  She'll step down to 40mg  qd prednisone starting tomorrow and continue taper we outlined last visit.  Encouraged her to titrate insulin dosing gradually to help cover her elevated sugars lately due to systemic steroid effects.   She has hx of mild LV dysfunction but her fluid balance looks ok and I don't think any of her current resp issues are coming from pulm edema.  Continue current CV meds/diuretic.  An After Visit Summary was printed and given to the patient.  FOLLOW UP: 7-10d if not feeling significantly improved, earlier if worsening.

## 2014-06-04 ENCOUNTER — Other Ambulatory Visit: Payer: Self-pay | Admitting: Family Medicine

## 2014-06-10 ENCOUNTER — Telehealth: Payer: Self-pay | Admitting: *Deleted

## 2014-06-10 MED ORDER — INSULIN ASPART PROT & ASPART (70-30 MIX) 100 UNIT/ML PEN: PEN_INJECTOR | SUBCUTANEOUS | Status: DC

## 2014-06-10 NOTE — Telephone Encounter (Signed)
Novolog 70/30 flexpen rx'd, no change in dose recommended at this time.-thx

## 2014-06-10 NOTE — Telephone Encounter (Signed)
Received fax yesterday to do PA for Humilin 70/30 kwikpen. Insurance informed pharmacy today that they will not pay for [employer]. Insurance will pay for  Novovlog 70/30 flexpen. Pharmacy stated that pt is out of insulin and also wanted to know if you want to adjust pt's insulin dose?

## 2014-06-11 NOTE — Telephone Encounter (Signed)
Pharmacy aware

## 2014-06-18 ENCOUNTER — Other Ambulatory Visit: Payer: Self-pay | Admitting: Family Medicine

## 2014-06-21 ENCOUNTER — Telehealth: Payer: Self-pay | Admitting: Family Medicine

## 2014-06-21 NOTE — Telephone Encounter (Signed)
Please advise 

## 2014-06-21 NOTE — Telephone Encounter (Signed)
Patient's pharmacy closes at 5PM so if Rx if called in after then please send to [facility].

## 2014-06-21 NOTE — Telephone Encounter (Signed)
Sorry, must be seen before i can determine treatments.

## 2014-06-21 NOTE — Telephone Encounter (Signed)
Anniyah's daughter called to see if Dr. Milinda CaveMcGowen could call in a RX for Mahsa as she has a terrible cough and wheezing as well as congestion. Please call [phone]. I explained that Dr. Milinda CaveMcGowen may want her to be seen.

## 2014-06-22 NOTE — Telephone Encounter (Signed)
Pt's daughter called back very angry and stated that we were the reason her mother is in hospital right now because we didn't call in a medication for the patient.   I explained to patient that per her first phone call that she was advised that pt may need OV in order to get medications.  Patient then insisted she talk to clinical supervisor, I then transferred her to [place].

## 2014-06-22 NOTE — Telephone Encounter (Signed)
Pt's called [facility] last night at 6:18 pm and they advised pt to go to ED.

## 2014-06-22 NOTE — Telephone Encounter (Signed)
LMOM for number provided for pt or pt's daughter to [facility].

## 2014-06-22 NOTE — Telephone Encounter (Signed)
Pts daughter, Carroll KindsDebra Chilton  Called and was upset about her others care so Misty StanleyLisa transferred the call to  [facility].  I listened to Stanton KidneyDebra explain her concerns. I then redirected the conversation and went over what actually happened the previous day. I reiterated that on her initial call she was told that Dr. Milinda CaveMcGowen may want to she her mother before he prescribed a medication. To make it convenient for the pt. the daughter asked if I could put in a request for a medication so pt did  not have to make the trip to the office.  I did send a message for a RX to be called in. I also explained that I hoped someone would get back to her at lunch time. Stanton KidneyDebra stated she had a 2pm appt and in  actuality she was told by the front office that there was a 2pm opening if Dr. Milinda CaveMcGowen decided to have [facility] come in.  Stanton KidneyDebra called later and left message that if a RX was going to be called in call the [facility] because her other Pharm[facility] was calling at 5pm.  During our conversation today after we went over the previous day chain of events Stanton KidneyDebra started talking poorly about Dr. Milinda CaveMcGowen stating he should have done his work before 9:30pm at night, clearly her mother needs another doctor.Stanton KidneyDebra also stated it was our fault her mother was in ICU. I explained that was her decision to make if she wanted to chose another doctor and I was sorry her mother was in ICU. She stated " your staff is a bunch of bitches" and I  told Stanton KidneyDebra I was the one she talked to yesterday as well and then she called me a bitch. She continued to rant using bad language and carried on in a very nasty manner so I hung up the phone. Dwaine Deter/DH

## 2014-06-23 ENCOUNTER — Encounter (HOSPITAL_COMMUNITY): Payer: Self-pay | Admitting: Physician Assistant

## 2014-06-23 ENCOUNTER — Encounter: Payer: Self-pay | Admitting: Family Medicine

## 2014-06-23 ENCOUNTER — Inpatient Hospital Stay (HOSPITAL_COMMUNITY)
Admission: AD | Admit: 2014-06-23 | Discharge: 2014-06-29 | DRG: 280 | Disposition: A | Discharge status: Skilled Nursing Facility | Payer: PPO | Source: Other Acute Inpatient Hospital | Attending: Internal Medicine | Admitting: Internal Medicine

## 2014-06-23 ENCOUNTER — Inpatient Hospital Stay (HOSPITAL_COMMUNITY): Payer: PPO

## 2014-06-23 DIAGNOSIS — G4733 Obstructive sleep apnea (adult) (pediatric): Secondary | ICD-10-CM | POA: Diagnosis present

## 2014-06-23 DIAGNOSIS — E870 Hyperosmolality and hypernatremia: Secondary | ICD-10-CM | POA: Diagnosis not present

## 2014-06-23 DIAGNOSIS — M810 Age-related osteoporosis without current pathological fracture: Secondary | ICD-10-CM | POA: Diagnosis present

## 2014-06-23 DIAGNOSIS — Z794 Long term (current) use of insulin: Secondary | ICD-10-CM

## 2014-06-23 DIAGNOSIS — R0602 Shortness of breath: Secondary | ICD-10-CM | POA: Diagnosis present

## 2014-06-23 DIAGNOSIS — J9601 Acute respiratory failure with hypoxia: Secondary | ICD-10-CM | POA: Diagnosis not present

## 2014-06-23 DIAGNOSIS — I255 Ischemic cardiomyopathy: Secondary | ICD-10-CM | POA: Diagnosis present

## 2014-06-23 DIAGNOSIS — Z9289 Personal history of other medical treatment: Secondary | ICD-10-CM

## 2014-06-23 DIAGNOSIS — Z955 Presence of coronary angioplasty implant and graft: Secondary | ICD-10-CM

## 2014-06-23 DIAGNOSIS — Z7982 Long term (current) use of aspirin: Secondary | ICD-10-CM | POA: Diagnosis not present

## 2014-06-23 DIAGNOSIS — I252 Old myocardial infarction: Secondary | ICD-10-CM

## 2014-06-23 DIAGNOSIS — I447 Left bundle-branch block, unspecified: Secondary | ICD-10-CM | POA: Diagnosis present

## 2014-06-23 DIAGNOSIS — I5181 Takotsubo syndrome: Secondary | ICD-10-CM

## 2014-06-23 DIAGNOSIS — I130 Hypertensive heart and chronic kidney disease with heart failure and stage 1 through stage 4 chronic kidney disease, or unspecified chronic kidney disease: Principal | ICD-10-CM | POA: Diagnosis present

## 2014-06-23 DIAGNOSIS — Z9981 Dependence on supplemental oxygen: Secondary | ICD-10-CM | POA: Diagnosis not present

## 2014-06-23 DIAGNOSIS — E785 Hyperlipidemia, unspecified: Secondary | ICD-10-CM | POA: Diagnosis present

## 2014-06-23 DIAGNOSIS — I10 Essential (primary) hypertension: Secondary | ICD-10-CM | POA: Diagnosis not present

## 2014-06-23 DIAGNOSIS — J9620 Acute and chronic respiratory failure, unspecified whether with hypoxia or hypercapnia: Secondary | ICD-10-CM | POA: Diagnosis not present

## 2014-06-23 DIAGNOSIS — N183 Chronic kidney disease, stage 3 (moderate): Secondary | ICD-10-CM | POA: Diagnosis present

## 2014-06-23 DIAGNOSIS — I214 Non-ST elevation (NSTEMI) myocardial infarction: Secondary | ICD-10-CM | POA: Diagnosis present

## 2014-06-23 DIAGNOSIS — N179 Acute kidney failure, unspecified: Secondary | ICD-10-CM | POA: Diagnosis present

## 2014-06-23 DIAGNOSIS — Z87891 Personal history of nicotine dependence: Secondary | ICD-10-CM

## 2014-06-23 DIAGNOSIS — I2510 Atherosclerotic heart disease of native coronary artery without angina pectoris: Secondary | ICD-10-CM | POA: Diagnosis not present

## 2014-06-23 DIAGNOSIS — J441 Chronic obstructive pulmonary disease with (acute) exacerbation: Secondary | ICD-10-CM | POA: Diagnosis present

## 2014-06-23 DIAGNOSIS — D72829 Elevated white blood cell count, unspecified: Secondary | ICD-10-CM | POA: Diagnosis present

## 2014-06-23 DIAGNOSIS — I5023 Acute on chronic systolic (congestive) heart failure: Secondary | ICD-10-CM | POA: Diagnosis not present

## 2014-06-23 DIAGNOSIS — M1990 Unspecified osteoarthritis, unspecified site: Secondary | ICD-10-CM | POA: Diagnosis present

## 2014-06-23 DIAGNOSIS — Z6838 Body mass index (BMI) 38.0-38.9, adult: Secondary | ICD-10-CM | POA: Diagnosis not present

## 2014-06-23 DIAGNOSIS — T380X5A Adverse effect of glucocorticoids and synthetic analogues, initial encounter: Secondary | ICD-10-CM | POA: Diagnosis present

## 2014-06-23 DIAGNOSIS — J9622 Acute and chronic respiratory failure with hypercapnia: Secondary | ICD-10-CM | POA: Diagnosis present

## 2014-06-23 DIAGNOSIS — F419 Anxiety disorder, unspecified: Secondary | ICD-10-CM | POA: Diagnosis present

## 2014-06-23 DIAGNOSIS — I5021 Acute systolic (congestive) heart failure: Secondary | ICD-10-CM | POA: Diagnosis not present

## 2014-06-23 DIAGNOSIS — J9690 Respiratory failure, unspecified, unspecified whether with hypoxia or hypercapnia: Secondary | ICD-10-CM

## 2014-06-23 DIAGNOSIS — I213 ST elevation (STEMI) myocardial infarction of unspecified site: Secondary | ICD-10-CM | POA: Diagnosis not present

## 2014-06-23 DIAGNOSIS — E871 Hypo-osmolality and hyponatremia: Secondary | ICD-10-CM | POA: Diagnosis not present

## 2014-06-23 DIAGNOSIS — Z978 Presence of other specified devices: Secondary | ICD-10-CM

## 2014-06-23 DIAGNOSIS — E876 Hypokalemia: Secondary | ICD-10-CM | POA: Diagnosis not present

## 2014-06-23 DIAGNOSIS — J9621 Acute and chronic respiratory failure with hypoxia: Secondary | ICD-10-CM | POA: Diagnosis not present

## 2014-06-23 DIAGNOSIS — Z9861 Coronary angioplasty status: Secondary | ICD-10-CM

## 2014-06-23 DIAGNOSIS — E1165 Type 2 diabetes mellitus with hyperglycemia: Secondary | ICD-10-CM | POA: Diagnosis present

## 2014-06-23 DIAGNOSIS — E118 Type 2 diabetes mellitus with unspecified complications: Secondary | ICD-10-CM | POA: Diagnosis not present

## 2014-06-23 DIAGNOSIS — J9600 Acute respiratory failure, unspecified whether with hypoxia or hypercapnia: Secondary | ICD-10-CM

## 2014-06-23 HISTORY — DX: Gastro-esophageal reflux disease without esophagitis: K21.9

## 2014-06-23 HISTORY — DX: Chronic respiratory failure, unspecified whether with hypoxia or hypercapnia: J96.10

## 2014-06-23 HISTORY — DX: Personal history of peptic ulcer disease: Z87.11

## 2014-06-23 HISTORY — DX: Obstructive sleep apnea (adult) (pediatric): G47.33

## 2014-06-23 HISTORY — DX: Dependence on supplemental oxygen: Z99.81

## 2014-06-23 LAB — CBC WITH DIFFERENTIAL/PLATELET
BASOS PCT: 0 % (ref 0–1)
Basophils Absolute: 0 10*3/uL (ref 0.0–0.1)
EOS ABS: 0 10*3/uL (ref 0.0–0.7)
Eosinophils Relative: 0 % (ref 0–5)
HEMATOCRIT: 41.9 % (ref 36.0–46.0)
Hemoglobin: 13.4 g/dL (ref 12.0–15.0)
Lymphocytes Relative: 6 % — ABNORMAL LOW (ref 12–46)
Lymphs Abs: 1.1 10*3/uL (ref 0.7–4.0)
MCH: 29.9 pg (ref 26.0–34.0)
MCHC: 32 g/dL (ref 30.0–36.0)
MCV: 93.5 fL (ref 78.0–100.0)
MONO ABS: 1.3 10*3/uL — AB (ref 0.1–1.0)
Monocytes Relative: 8 % (ref 3–12)
NEUTROS ABS: 15.3 10*3/uL — AB (ref 1.7–7.7)
NEUTROS PCT: 86 % — AB (ref 43–77)
Platelets: 234 10*3/uL (ref 150–400)
RBC: 4.48 MIL/uL (ref 3.87–5.11)
RDW: 12.1 % (ref 11.5–15.5)
WBC: 17.6 10*3/uL — ABNORMAL HIGH (ref 4.0–10.5)

## 2014-06-23 LAB — POCT I-STAT 3, ART BLOOD GAS (G3+)
Acid-Base Excess: 5 mmol/L — ABNORMAL HIGH (ref 0.0–2.0)
Bicarbonate: 31.3 mEq/L — ABNORMAL HIGH (ref 20.0–24.0)
O2 Saturation: 97 %
PCO2 ART: 54.5 mmHg — AB (ref 35.0–45.0)
PH ART: 7.367 (ref 7.350–7.450)
Patient temperature: 98.6
TCO2: 33 mmol/L (ref 0–100)
pO2, Arterial: 97 mmHg (ref 80.0–100.0)

## 2014-06-23 LAB — BASIC METABOLIC PANEL
ANION GAP: 10 (ref 5–15)
BUN: 47 mg/dL — ABNORMAL HIGH (ref 6–23)
CO2: 32 mmol/L (ref 19–32)
Calcium: 8.9 mg/dL (ref 8.4–10.5)
Chloride: 91 mmol/L — ABNORMAL LOW (ref 96–112)
Creatinine, Ser: 1.95 mg/dL — ABNORMAL HIGH (ref 0.50–1.10)
GFR calc Af Amer: 28 mL/min — ABNORMAL LOW (ref >90)
GFR calc non Af Amer: 24 mL/min — ABNORMAL LOW (ref >90)
Glucose, Bld: 380 mg/dL — ABNORMAL HIGH (ref 70–99)
Potassium: 4.2 mmol/L (ref 3.5–5.1)
SODIUM: 133 mmol/L — AB (ref 135–145)

## 2014-06-23 LAB — TROPONIN I: Troponin I: 9.89 ng/mL (ref <0.031)

## 2014-06-23 LAB — MRSA PCR SCREENING: MRSA by PCR: POSITIVE — AB

## 2014-06-23 LAB — GLUCOSE, CAPILLARY: Glucose-Capillary: 377 mg/dL — ABNORMAL HIGH (ref 70–99)

## 2014-06-23 MED ORDER — LEVOFLOXACIN IN D5W 750 MG/150ML IV SOLN
750.0000 mg | Freq: Once | INTRAVENOUS | Status: AC
  Administered 2014-06-23: 750 mg via INTRAVENOUS
  Filled 2014-06-23: qty 150

## 2014-06-23 MED ORDER — SODIUM CHLORIDE 0.9 % IV SOLN
250.0000 mL | INTRAVENOUS | Status: DC | PRN
  Administered 2014-06-24: 5 mL via INTRAVENOUS

## 2014-06-23 MED ORDER — HEPARIN BOLUS VIA INFUSION
4000.0000 [IU] | Freq: Once | INTRAVENOUS | Status: AC
  Administered 2014-06-23: 4000 [IU] via INTRAVENOUS
  Filled 2014-06-23: qty 4000

## 2014-06-23 MED ORDER — INSULIN ASPART 100 UNIT/ML ~~LOC~~ SOLN: 0.0000 [IU] | Freq: Three times a day (TID) | SUBCUTANEOUS | Status: DC

## 2014-06-23 MED ORDER — ISOSORBIDE MONONITRATE ER 30 MG PO TB24: 30.0000 mg | ORAL_TABLET | Freq: Every day | ORAL | Status: DC

## 2014-06-23 MED ORDER — GUAIFENESIN-DM 100-10 MG/5ML PO SYRP: 5.0000 mL | ORAL_SOLUTION | ORAL | Status: DC | PRN

## 2014-06-23 MED ORDER — IPRATROPIUM-ALBUTEROL 0.5-2.5 (3) MG/3ML IN SOLN
3.0000 mL | RESPIRATORY_TRACT | Status: DC
  Administered 2014-06-24 (×3): 3 mL via RESPIRATORY_TRACT
  Filled 2014-06-23 (×3): qty 3

## 2014-06-23 MED ORDER — ONDANSETRON HCL 4 MG/2ML IJ SOLN: 4.0000 mg | Freq: Four times a day (QID) | INTRAMUSCULAR | Status: DC | PRN

## 2014-06-23 MED ORDER — ASPIRIN EC 81 MG PO TBEC
81.0000 mg | DELAYED_RELEASE_TABLET | Freq: Every day | ORAL | Status: DC
  Administered 2014-06-25: 81 mg via ORAL
  Filled 2014-06-23 (×2): qty 1

## 2014-06-23 MED ORDER — ALBUTEROL SULFATE (2.5 MG/3ML) 0.083% IN NEBU: 2.5000 mg | INHALATION_SOLUTION | Freq: Four times a day (QID) | RESPIRATORY_TRACT | Status: DC

## 2014-06-23 MED ORDER — ONDANSETRON HCL 4 MG PO TABS: 4.0000 mg | ORAL_TABLET | Freq: Four times a day (QID) | ORAL | Status: DC | PRN

## 2014-06-23 MED ORDER — METHYLPREDNISOLONE SODIUM SUCC 125 MG IJ SOLR
80.0000 mg | Freq: Two times a day (BID) | INTRAMUSCULAR | Status: DC
  Administered 2014-06-23 – 2014-06-24 (×2): 80 mg via INTRAVENOUS
  Filled 2014-06-23 (×2): qty 2

## 2014-06-23 MED ORDER — IPRATROPIUM BROMIDE 0.02 % IN SOLN: 0.5000 mg | Freq: Four times a day (QID) | RESPIRATORY_TRACT | Status: DC

## 2014-06-23 MED ORDER — SODIUM CHLORIDE 0.9 % IJ SOLN: 3.0000 mL | INTRAMUSCULAR | Status: DC | PRN

## 2014-06-23 MED ORDER — SODIUM CHLORIDE 0.9 % IJ SOLN
3.0000 mL | Freq: Two times a day (BID) | INTRAMUSCULAR | Status: DC
  Administered 2014-06-23 – 2014-06-25 (×3): 3 mL via INTRAVENOUS

## 2014-06-23 MED ORDER — ALBUTEROL SULFATE (2.5 MG/3ML) 0.083% IN NEBU
2.5000 mg | INHALATION_SOLUTION | RESPIRATORY_TRACT | Status: DC | PRN
  Filled 2014-06-23 (×2): qty 3

## 2014-06-23 MED ORDER — HEPARIN (PORCINE) IN NACL 100-0.45 UNIT/ML-% IJ SOLN
1150.0000 [IU]/h | INTRAMUSCULAR | Status: DC
  Administered 2014-06-23: 1150 [IU]/h via INTRAVENOUS
  Filled 2014-06-23: qty 250

## 2014-06-23 MED ORDER — ALPRAZOLAM 0.5 MG PO TABS
0.5000 mg | ORAL_TABLET | Freq: Three times a day (TID) | ORAL | Status: DC | PRN
  Administered 2014-06-23 – 2014-06-28 (×10): 0.5 mg via ORAL
  Filled 2014-06-23 (×11): qty 1

## 2014-06-23 MED ORDER — LEVOFLOXACIN IN D5W 750 MG/150ML IV SOLN
750.0000 mg | INTRAVENOUS | Status: DC
  Administered 2014-06-25: 750 mg via INTRAVENOUS
  Filled 2014-06-23 (×2): qty 150

## 2014-06-23 NOTE — Progress Notes (Signed)
Spoke with pt and family about pt's use of CPAP at night. Pt states that she doesn't wear one typically at her home, and had anxiety issues with it last night. Explained that she can wear one tonight later if she changes her mind.

## 2014-06-23 NOTE — Telephone Encounter (Signed)
Noted. I recommended we dismiss this patient from the practice. I filled out the necessary paperwork to get this process started today.

## 2014-06-23 NOTE — Progress Notes (Addendum)
ANTICOAGULATION CONSULT NOTE - Initial Consult  Pharmacy Consult for heparin Indication: chest pain/ACS  Allergies  Allergen Reactions  . Codeine Nausea And Vomiting    vomiting  . Terbutaline Sulfate     REACTION: INTOL to terbutaline    Patient Measurements: Height: 5\' 4"  (162.6 cm) Weight: 230 lb (104.327 kg) IBW/kg (Calculated) : 54.7 Heparin Dosing Weight: 79 kg  Vital Signs: Temp: 99.6 F (37.6 C) (04/20 1715) Temp Source: Oral (04/20 1715) BP: 119/52 mmHg (04/20 1725) Pulse Rate: 98 (04/20 1725)  Labs: No results for input(s): HGB, HCT, PLT, APTT, LABPROT, INR, HEPARINUNFRC, CREATININE, CKTOTAL, CKMB, TROPONINI in the last 72 hours.  CrCl cannot be calculated (Patient has no serum creatinine result on file.).   Medical History: Past Medical History  Diagnosis Date  . COPD (chronic obstructive pulmonary disease)     Oxygen prn ambulation  . Hyperlipidemia   . Essential hypertension, benign   . Anemia   . Anxiety   . Coronary atherosclerosis of native coronary artery     a. history of MI with PTCA/stent to LAD 07/1998. b. Repeat PTCA to this lesion in 02/1999. c. PTCA/brachytherapy to mLAD in 2001  . Type 2 diabetes mellitus   . Osteoporosis   . Venous insufficiency   . Obesity   . Myocardial infarction     AMI 2000  . Asthma   . Herpes zoster 06/2013  . Tobacco abuse   . Chronic respiratory failure   . GERD (gastroesophageal reflux disease)   . OSA on CPAP   . On home oxygen therapy     "2L all the time" (06/23/2014)  . Arthritis     L-spine, hips, knees, elbows, wrists, hands  . DDD (degenerative disc disease), lumbar   . Osteoarthritis     Medications:  Prescriptions prior to admission  Medication Sig Dispense Refill Last Dose  . albuterol (PROVENTIL HFA;VENTOLIN HFA) 108 (90 BASE) MCG/ACT inhaler Inhale 2 puffs into the lungs every 6 (six) hours as needed for wheezing or shortness of breath. 1 Inhaler 6 Taking  . albuterol (PROVENTIL) (2.5  MG/3ML) 0.083% nebulizer solution Take 3 mLs (2.5 mg total) by nebulization 4 (four) times daily. 75 mL 11 Taking  . ALPRAZolam (XANAX) 0.5 MG tablet TAKE 1/2 TO 1 TABLET THREE TIMES DAILY AS NEEDED 90 tablet 5 Taking  . aspirin 81 MG tablet Take 81 mg by mouth daily.   Taking  . atenolol (TENORMIN) 50 MG tablet TAKE 1 TABLET ONCE A DAY 30 tablet 3 Taking  . azithromycin (ZITHROMAX) 250 MG tablet 2 tabs po qd x 1d, then 1 tab po qd x 4d 6 tablet 0 Taking  . budesonide (PULMICORT) 0.25 MG/2ML nebulizer solution Take 2 mLs (0.25 mg total) by nebulization 2 (two) times daily. 60 mL 11 Taking  . calcium-vitamin D (OSCAL WITH D) 500-200 MG-UNIT per tablet Take 1 tablet by mouth 2 (two) times daily.     Taking  . Cholecalciferol (VITAMIN D3) 1000 UNITS CAPS Take 1 capsule by mouth daily.    Taking  . dextromethorphan-guaiFENesin (MUCINEX DM) 30-600 MG per 12 hr tablet Take 2 tablets by mouth every 12 (twelve) hours.     Taking  . etodolac (LODINE) 400 MG tablet TAKE ONE TABLET BY MOUTH EVERY 12 HOURS 60 tablet 3 Taking  . ferrous sulfate 325 (65 FE) MG tablet Take 325 mg by mouth daily with breakfast.    Taking  . fluticasone (FLONASE) 50 MCG/ACT nasal spray 2 SPRAYS IN  EACH NOSTRIL ONCE A DAY 16 g 3 Taking  . furosemide (LASIX) 40 MG tablet Take 1 tablet (40 mg total) by mouth daily. 30 tablet 6 Taking  . gabapentin (NEURONTIN) 300 MG capsule TAKE ONE CAPSULE BY MOUTH THREE TIMES DAILY 90 capsule 11 Taking  . glucose blood (ONE TOUCH ULTRA TEST) test strip TEST BLOOD SUGAR LEVELS THREE TIMES DAILY 100 each 5 Taking  . HYDROcodone-acetaminophen (NORCO/VICODIN) 5-325 MG per tablet Take 1 tablet by mouth every 6 (six) hours as needed for moderate pain. 15 tablet 0 Taking  . ibandronate (BONIVA) 150 MG tablet TAKE 1 TABLET EVERY MONTH ON THE SAME DAY 1 tablet 1   . insulin aspart protamine - aspart (NOVOLOG MIX 70/30 FLEXPEN) (70-30) 100 UNIT/ML FlexPen 34 U SQ bid 15 mL 11   . ipratropium (ATROVENT) 0.02 %  nebulizer solution Take 2.5 mLs (500 mcg total) by nebulization 4 (four) times daily. 75 mL 11 Taking  . isosorbide mononitrate (IMDUR) 30 MG 24 hr tablet TAKE 1 TABLET DAILY 90 tablet 1   . losartan (COZAAR) 50 MG tablet TAKE ONE TABLET BY MOUTH ONCE DAILY 30 tablet 3   . nitroGLYCERIN (NITROSTAT) 0.4 MG SL tablet Place 1 tablet (0.4 mg total) under the tongue every 5 (five) minutes as needed. May repeat x3 25 tablet 6 Taking  . omeprazole (PRILOSEC) 20 MG capsule TAKE ONE CAPSULE BY MOUTH ONCE DAILY 30 MINUTES BEFORE A MEAL 30 capsule 3 Taking  . ONE TOUCH LANCETS MISC Check blood sugars two times daily 100 each 6 Taking  . pioglitazone (ACTOS) 15 MG tablet TAKE 1 TABLET DAILY 30 tablet 3 Taking  . predniSONE (DELTASONE) 20 MG tablet 3 tabs po qd x 4d, then 2 tabs po qd x 4d, then 1 tab po qd x 4d 24 tablet 0 Taking  . Probiotic Product (PROBIOTIC DAILY PO) Take 1 tablet by mouth daily.    Taking  . simvastatin (ZOCOR) 80 MG tablet TAKE ONE TABLET AT BEDTIME 30 tablet 3 Taking  . traMADol (ULTRAM) 50 MG tablet 1-2 tabs po q8h prn pain 180 tablet 2 Taking  . TRUEPLUS INSULIN SYRINGE 30G X 5/16" 0.5 ML MISC USE 3 TIMES A DAY AS DIRECTED 100 each 6 Taking  . vitamin B-12 (CYANOCOBALAMIN) 500 MCG tablet Take 500 mcg by mouth daily.    Taking  . vitamin C (ASCORBIC ACID) 500 MG tablet Take 500 mg by mouth daily.   Taking    Assessment: 74 year old woman transferred from [facility] for workup of NSTEMI.  Pharmacy asked to start IV heparin.  Heparin is not currently infusing from [facility]. Goal of Therapy:  Heparin level 0.3-0.7 units/ml Monitor platelets by anticoagulation protocol: Yes   Plan:  Heparin 4000 units x 1 dose, then 1150 units/hr. Check heparin level in 8 hours and daily while on heparin. Daily CBC while on heparin.  Mickeal Skinner 06/23/2014,7:19 PM  Addendum: Asked to start levofloxacin.  Labs ordered but pending.  Will give levofloxacin  IV x 1 dose tonight  and follow up labs for subsequent dosing.  Celedonio Miyamoto, PharmD, BCPS Clinical Pharmacist Pager [phone]

## 2014-06-23 NOTE — H&P (Signed)
PCP:   Jeoffrey Massed, MD   Chief Complaint:  Sob, transfer from [facility]  HPI: 74 yo female h/o chf, copd, on 2 liters oxygen at home, OSA cpap dep qhs, CAD was admittted yesterday at [facility] for acute hypoxic and hypercapneic respiratory failure required bipap through the night.  Was thought to be due to chf, trop peaked at 20.  Cardiology was consulted, who recommended transfer to [facility], pt was accepted to [facility] by cardiology team for NSTEMI.  After they further reviewed her chart, felt pt resp failure was due to pulm issues rather than chf.  Pt initial abg mildly acidotic with pco2 of 120, serial abg done at [facility] have improved with resolution of her hypercapnea and acidosis.  Her initial cxr showed no infiltrate or edema.  bnp was around 700.  Cr had bumped to around 2.2.  She was given lasix, iv solumedrol and levaquin.  She has been started on a heparin gtt per cardiology team.  Cardiology team has asked for [facility] to take over as primary.  Pt arrived to [facility] with diffuse wheezing.  On my interview, she states she was taken off the bipap this am, and she is breathing much better since yesterday.  She does not recall much the events of what happened yesterday.  She denies any cp.  She is sob.  She denies any leg pain.  No n/v/d.  No abd pain.  No fevers, no uri symptoms, malaise prior to her admission.  She is compliant with her meds, and her cpap every night.  Denies any focal neurological symptoms.    Review of Systems:  Positive and negative as per HPI otherwise all other systems are negative  Past Medical History: Past Medical History  Diagnosis Date  . COPD (chronic obstructive pulmonary disease)     Oxygen prn ambulation  . Hyperlipidemia   . Essential hypertension, benign   . Anemia   . Anxiety   . Coronary atherosclerosis of native coronary artery     a. history of MI with PTCA/stent to LAD 07/1998. b. Repeat PTCA to this lesion in 02/1999. c. PTCA/brachytherapy to  mLAD in 2001  . Osteoporosis   . Venous insufficiency   . Obesity   . Asthma   . Herpes zoster 06/2013  . Tobacco abuse   . Chronic respiratory failure   . GERD (gastroesophageal reflux disease)   . On home oxygen therapy     "2L all the time" (06/23/2014)  . Myocardial infarction 2000    2000  . Anginal pain     "light ones q now and then" (06/23/2014)  . Chronic bronchitis     "q fall and q spring" (06/23/2014)  . OSA (obstructive sleep apnea)     "does not wear CPAP"/daughters at bedside on 06/23/2014  . Type 2 diabetes mellitus   . History of blood transfusion X 1    "related to bleeding ulcers"  . History of bleeding ulcers   . Arthritis     "loaded w/it" (06/23/2014)  . DDD (degenerative disc disease), lumbar   . Osteoarthritis    Past Surgical History  Procedure Laterality Date  . Total abdominal hysterectomy      DUB (non-malignant reasons).  No hx of abnormal pap smears.  . Shoulder open rotator cuff repair Right   . Cataract extraction w/phaco Left 06/30/2012    Procedure: CATARACT EXTRACTION PHACO AND INTRAOCULAR LENS PLACEMENT (IOC);  Surgeon: Gemma Payor, MD;  Location: [facility];  Service:  Ophthalmology;  Laterality: Left;  CDE:12.32  . Transthoracic echocardiogram  06/2013    EF 40%, + WMA  . Coronary artery bypass graft    . Alveoloplasty  10/1999    [name]/notes 07/19/2010 Surgical extraction of root segments #21, 22 and 27 with alveoloplasty.  . Cardiac catheterization  07/2012    + progressive CAD but no lesions to intervene upon, EF 65%; med mgmt continued  . Coronary angioplasty with stent placement      PTCA/BMS LAD 2000, PTCA/brachytherapy LAD 2001, LVEF 55%   . Coronary angioplasty with stent placement  2000; 2001; 2002    [name]/notes 05/18/2010  . Tonsillectomy  1949  . Laparoscopic cholecystectomy    . Cataract extraction w/ intraocular lens implant Right 2015?    Medications: Prior to Admission medications   Medication Sig Start Date End Date Taking? Authorizing  Provider  albuterol (PROVENTIL HFA;VENTOLIN HFA) 108 (90 BASE) MCG/ACT inhaler Inhale 2 puffs into the lungs every 6 (six) hours as needed for wheezing or shortness of breath. 02/02/13   Michele McalpineScott M Nadel, MD  albuterol (PROVENTIL) (2.5 MG/3ML) 0.083% nebulizer solution Take 3 mLs (2.5 mg total) by nebulization 4 (four) times daily. 06/07/11   Michele McalpineScott M Nadel, MD  ALPRAZolam Prudy Feeler(XANAX) 0.5 MG tablet TAKE 1/2 TO 1 TABLET THREE TIMES DAILY AS NEEDED 01/04/14   Jeoffrey MassedPhilip H McGowen, MD  aspirin 81 MG tablet Take 81 mg by mouth daily.    Historical Provider, MD  atenolol (TENORMIN) 50 MG tablet TAKE 1 TABLET ONCE A DAY 05/05/14   Jeoffrey MassedPhilip H McGowen, MD  azithromycin (ZITHROMAX) 250 MG tablet 2 tabs po qd x 1d, then 1 tab po qd x 4d 05/17/14   Jeoffrey MassedPhilip H McGowen, MD  budesonide (PULMICORT) 0.25 MG/2ML nebulizer solution Take 2 mLs (0.25 mg total) by nebulization 2 (two) times daily. 06/07/11   Michele McalpineScott M Nadel, MD  calcium-vitamin D (OSCAL WITH D) 500-200 MG-UNIT per tablet Take 1 tablet by mouth 2 (two) times daily.      Historical Provider, MD  Cholecalciferol (VITAMIN D3) 1000 UNITS CAPS Take 1 capsule by mouth daily.     Historical Provider, MD  dextromethorphan-guaiFENesin (MUCINEX DM) 30-600 MG per 12 hr tablet Take 2 tablets by mouth every 12 (twelve) hours.      Historical Provider, MD  etodolac (LODINE) 400 MG tablet TAKE ONE TABLET BY MOUTH EVERY 12 HOURS 08/23/10   Michele McalpineScott M Nadel, MD  ferrous sulfate 325 (65 FE) MG tablet Take 325 mg by mouth daily with breakfast.     Historical Provider, MD  fluticasone (FLONASE) 50 MCG/ACT nasal spray 2 SPRAYS IN EACH NOSTRIL ONCE A DAY 05/05/14   Jeoffrey MassedPhilip H McGowen, MD  furosemide (LASIX) 40 MG tablet Take 1 tablet (40 mg total) by mouth daily. 05/17/14   Jeoffrey MassedPhilip H McGowen, MD  gabapentin (NEURONTIN) 300 MG capsule TAKE ONE CAPSULE BY MOUTH THREE TIMES DAILY 12/21/13   Jeoffrey MassedPhilip H McGowen, MD  glucose blood (ONE TOUCH ULTRA TEST) test strip TEST BLOOD SUGAR LEVELS THREE TIMES DAILY 01/08/14    Jeoffrey MassedPhilip H McGowen, MD  HYDROcodone-acetaminophen (NORCO/VICODIN) 5-325 MG per tablet Take 1 tablet by mouth every 6 (six) hours as needed for moderate pain. 08/29/13   Gerhard Munchobert Lockwood, MD  ibandronate (BONIVA) 150 MG tablet TAKE 1 TABLET EVERY MONTH ON THE SAME DAY 06/04/14   Jeoffrey MassedPhilip H McGowen, MD  insulin aspart protamine - aspart (NOVOLOG MIX 70/30 FLEXPEN) (70-30) 100 UNIT/ML FlexPen 34 U SQ bid 06/10/14   Loistine ChancePhilip  H McGowen, MD  ipratropium (ATROVENT) 0.02 % nebulizer solution Take 2.5 mLs (500 mcg total) by nebulization 4 (four) times daily. 06/07/11   Michele Mcalpine, MD  isosorbide mononitrate (IMDUR) 30 MG 24 hr tablet TAKE 1 TABLET DAILY 06/18/14   Jeoffrey Massed, MD  losartan (COZAAR) 50 MG tablet TAKE ONE TABLET BY MOUTH ONCE DAILY 06/04/14   Jeoffrey Massed, MD  nitroGLYCERIN (NITROSTAT) 0.4 MG SL tablet Place 1 tablet (0.4 mg total) under the tongue every 5 (five) minutes as needed. May repeat x3 07/23/12   Dyann Kief, PA-C  omeprazole (PRILOSEC) 20 MG capsule TAKE ONE CAPSULE BY MOUTH ONCE DAILY 30 MINUTES BEFORE A MEAL 03/09/14   Jeoffrey Massed, MD  ONE [facility] LANCETS MISC Check blood sugars two times daily 10/21/12   Michele Mcalpine, MD  pioglitazone (ACTOS) 15 MG tablet TAKE 1 TABLET DAILY 04/06/14   Jeoffrey Massed, MD  predniSONE (DELTASONE) 20 MG tablet 3 tabs po qd x 4d, then 2 tabs po qd x 4d, then 1 tab po qd x 4d 05/17/14   Jeoffrey Massed, MD  Probiotic Product (PROBIOTIC DAILY PO) Take 1 tablet by mouth daily.     Historical Provider, MD  simvastatin (ZOCOR) 80 MG tablet TAKE ONE TABLET AT BEDTIME 05/05/14   Jeoffrey Massed, MD  traMADol (ULTRAM) 50 MG tablet 1-2 tabs po q8h prn pain 05/05/14   Jeoffrey Massed, MD  TRUEPLUS INSULIN SYRINGE 30G X 5/16" 0.5 ML MISC USE 3 TIMES A DAY AS DIRECTED 03/24/14   Jeoffrey Massed, MD  vitamin B-12 (CYANOCOBALAMIN) 500 MCG tablet Take 500 mcg by mouth daily.     Historical Provider, MD  vitamin C (ASCORBIC ACID) 500 MG tablet Take 500 mg by mouth  daily.    Historical Provider, MD    Allergies:   Allergies  Allergen Reactions  . Codeine Nausea And Vomiting    vomiting  . Terbutaline Sulfate     REACTION: INTOL to terbutaline    Social History:  reports that she quit smoking about 15 years ago. Her smoking use included Cigarettes. She has a 30 pack-year smoking history. She has never used smokeless tobacco. She reports that she does not drink alcohol or use illicit drugs.  Family History: Family History  Problem Relation Age of Onset  . Coronary artery disease Other     Physical Exam: Filed Vitals:   06/23/14 1715 06/23/14 1725 06/23/14 2044  BP:  119/52 158/76  Pulse:  98 105  Temp: 99.6 F (37.6 C)  98.5 F (36.9 C)  TempSrc: Oral  Oral  Resp:  22 20  Height: 5\' 4"  (1.626 m)    Weight: 104.327 kg (230 lb)    SpO2:  95% 97%   General appearance: alert, cooperative and mild distress Head: Normocephalic, without obvious abnormality, atraumatic Eyes: negative Nose: Nares normal. Septum midline. Mucosa normal. No drainage or sinus tenderness. Neck: no JVD and supple, symmetrical, trachea midline Lungs: wheezes bilaterally Heart: regular rate and rhythm, S1, S2 normal, no murmur, click, rub or gallop Abdomen: soft, non-tender; bowel sounds normal; no masses,  no organomegaly Extremities: edema trace Pulses: 2+ and symmetric Skin: Skin color, texture, turgor normal. No rashes or lesions Neurologic: Grossly normal    Labs on Admission:    All labs, records reviewed from [facility]      Radiological Exams on Admission: Dg Chest 2 View  06/23/2014   CLINICAL DATA:  74 year old female with  a history of shortness of breath for 3 days  EXAM: CHEST - 2 VIEW  COMPARISON:  Plain film 08/29/2013, 06/22/2014  FINDINGS: Cardiomediastinal silhouette unchanged in size and contour. Coronary stents in place.  Similar appearance of mild interstitial and airspace opacities.  No pleural effusion or pneumothorax.  No  displaced fracture.  Unremarkable appearance of the upper abdomen.  IMPRESSION: Mixed interstitial and airspace opacities are nonspecific, potentially representing edema, atelectasis, and/ or atypical infection/consolidation.  Coronary artery disease.  Signed,  Yvone Neu. Loreta Ave, DO  [facility]  [facility]   Electronically Signed   By: Gilmer Mor D.O.   On: 06/23/2014 22:03    Assessment/Plan  74 yo female with acute on chronic hypoxic and hypercapneic respiratory failure from copde with NSTEMI  Principal Problem:   Acute on chronic respiratory failure-  More due to pulmonary issues.  Copde.  Treat with levaquin, freq nebs, bipap qhs, solumedrol.  Repeat abg now to make sure this has not worsened thru the day.  Does not appear volume overloaded.  Active Problems:   Obesity   Essential hypertension, benign   CORONARY ATHEROSCLEROSIS NATIVE CORONARY ARTERY   Cardiomyopathy, ischemic   Diabetes mellitus with complication-  ssi   NSTEMI (non-ST elevated myocardial infarction)-  Per cardiology team who is following along.  Echo in am.  Agree with heparin gtt.  Hold bblocker due to above issue.   Acute renal failure-  Monitor closely.  Likely due to recent increase in diuresis.  ua was neg at [facility] no sign of infection.  Ck bmp now.   COPD exacerbation-  As above.     Leukocytosis  Admit to tele.  Full code.  Expect LOC 2-4 days.  Francis Doenges A 06/23/2014, 10:12 PM

## 2014-06-23 NOTE — Consult Note (Signed)
Cardiology Consultation Note  Patient ID: [name], MRN: [id number], DOB/AGE: 1940-04-24 74 y.o. Admit date: 06/23/2014   Date of Consult: 06/23/2014 Primary Physician: Tammi Sou, MD Primary Cardiologist: Domenic Polite  Chief Complaint: SOB, left arm pain Reason for Consultation: NSTEMI  HPI: [name] is a 74 y/o F with CAD (history of MI with PTCA/stent to LAD 07/1998 with repeat PTCA to this lesion in 02/1999, PTCA/brachytherapy to mLAD in 2001), COPD/severe refractory asthma with chronic respiratory failure on home O2, HTN, HLD, anemia, DM, OSA, anxiety, polypharmacy, chronic systolic CHF EF 41-63% who presented to [facility] yesterday with acute worsening of SOB. Although chart suggests she may have had prior CABG this is false - there were 2 cath notes on the same day in 07/2012 but the one referencing CABG grafts was deleted, and the other one is consistent with her history. The patient denies prior CABG. Last cath in 2014 showed moderate diffuse prox LAD stenosis with ISR, nonobstructive Cx, moderately severe stenosis of small-nondominant RCA and moderate LV dysfunction with EF 35-40%, managed medically. (Last echo 2008, before this cath showed EF 55%.)  She presented yesterday to [facility] with acute respiratory failure requiring BiPAP, PCO2 was 122, subsequent value 59. Per notes she was treated with 5 nebulizer treatments, IV magnesium, IV Solu-Medrol, IV NTG and IV Lasix. Troponins went from 0.38 -> 15.13 -> 20.81 -> 11.97. EKG reportedly showed NSR with incomplete LBBB. CXR showed mild vascular congestion, negative for edema or effusion. BNP 721, initial Cr 1.17. Labs this morning revealed new hyponatremia 128, AKI with Cr 2.03, glucose 526, WBC 12.4. She was able to be de-escalated to nasal cannula. She was seen by cardiology at [facility] (?[facility]) who did not feel she was volume overloaded. She was transferred to [facility] for further evaluation. She reports on/off  chest pain for months but has not mentioned this to her cardiologist because she was not having CP the day she saw him. She does report left arm heaviness "like it's hanging there" on admission. Upon arrival she is in no acute distress and without active chest pain. Blood sugars running in the 300s-500s. Temp 99.6 here.  [place] Data: 4/19: pH 7.00, PCO2 122 --> subsequent abg pH 7.32, PCO2 59 Wbc 13, Hgb 14.7, Hct 48, Plt 249 Glu 332, BUN 13, Cr 1.17, Na 139, K 3.5, Cl 95, CO2 32, calcium 9.3, BNP 721  4/20: Na 128, Cl 87, CO2 30, BUN 41, Cr 2.03, glucose 526, calcium 8.2, TBili 0.5, AST 52, ALT 31, alk phos 52, total protein 5.9, albumin 3.1, WBC 12.4, Hgb 12, Hct 38.5, Plt 188  Troponins troponin 0.38 -> 15.13 -> 20.81  CXR 4/19: cardiac enlargement with vascular congestion suggestive of mild fluid overload, negative for edema or effusion, negative for PNA  Past Medical History  Diagnosis Date  . COPD (chronic obstructive pulmonary disease)     Oxygen prn ambulation  . Hyperlipidemia   . Essential hypertension, benign   . Anemia   . Anxiety   . Arthritis     L-spine, hips, knees, elbows, wrists, hands  . Coronary atherosclerosis of native coronary artery     a. history of MI with PTCA/stent to LAD 07/1998. b. Repeat PTCA to this lesion in 02/1999. c. PTCA/brachytherapy to mLAD in 2001  . Type 2 diabetes mellitus   . Osteoporosis   . Venous insufficiency   . Obesity   . Myocardial infarction     AMI 2000  .  Asthma   . Sleep apnea     Not on CPAP  . DDD (degenerative disc disease), lumbar   . Herpes zoster 06/2013  . Tobacco abuse   . Chronic respiratory failure   . GERD (gastroesophageal reflux disease)      Surgical History:  Past Surgical History  Procedure Laterality Date  . Total abdominal hysterectomy      DUB (non-malignant reasons).  No hx of abnormal pap smears.  . Cholecystectomy    . Rotator cuff repair Right   . Cataract extraction w/phaco Left 06/30/2012     Procedure: CATARACT EXTRACTION PHACO AND INTRAOCULAR LENS PLACEMENT (IOC);  Surgeon: Tonny Branch, MD;  Location: [facility];  Service: Ophthalmology;  Laterality: Left;  CDE:12.32  . Cardiac catheterization  07/2012    + progressive CAD but no lesions to intervene upon, EF 65%; med mgmt continued  . Transthoracic echocardiogram  06/2013    EF 40%, + WMA  . Coronary angioplasty with stent placement      PTCA/BMS LAD 2000, PTCA/brachytherapy LAD 2001, LVEF 55%   . Coronary artery bypass graft       Home Meds: Prior to Admission medications   Medication Sig Start Date End Date Taking? Authorizing Provider  albuterol (PROVENTIL HFA;VENTOLIN HFA) 108 (90 BASE) MCG/ACT inhaler Inhale 2 puffs into the lungs every 6 (six) hours as needed for wheezing or shortness of breath. 02/02/13   Noralee Space, MD  albuterol (PROVENTIL) (2.5 MG/3ML) 0.083% nebulizer solution Take 3 mLs (2.5 mg total) by nebulization 4 (four) times daily. 06/07/11   Noralee Space, MD  ALPRAZolam Duanne Moron) 0.5 MG tablet TAKE 1/2 TO 1 TABLET THREE TIMES DAILY AS NEEDED 01/04/14   Tammi Sou, MD  aspirin 81 MG tablet Take 81 mg by mouth daily.    Historical Provider, MD  atenolol (TENORMIN) 50 MG tablet TAKE 1 TABLET ONCE A DAY 05/05/14   Tammi Sou, MD  azithromycin (ZITHROMAX) 250 MG tablet 2 tabs po qd x 1d, then 1 tab po qd x 4d 05/17/14   Tammi Sou, MD  budesonide (PULMICORT) 0.25 MG/2ML nebulizer solution Take 2 mLs (0.25 mg total) by nebulization 2 (two) times daily. 06/07/11   Noralee Space, MD  calcium-vitamin D (OSCAL WITH D) 500-200 MG-UNIT per tablet Take 1 tablet by mouth 2 (two) times daily.      Historical Provider, MD  Cholecalciferol (VITAMIN D3) 1000 UNITS CAPS Take 1 capsule by mouth daily.     Historical Provider, MD  dextromethorphan-guaiFENesin (MUCINEX DM) 30-600 MG per 12 hr tablet Take 2 tablets by mouth every 12 (twelve) hours.      Historical Provider, MD  etodolac (LODINE) 400 MG tablet TAKE ONE TABLET BY  MOUTH EVERY 12 HOURS 08/23/10   Noralee Space, MD  ferrous sulfate 325 (65 FE) MG tablet Take 325 mg by mouth daily with breakfast.     Historical Provider, MD  fluticasone (FLONASE) 50 MCG/ACT nasal spray 2 SPRAYS IN EACH NOSTRIL ONCE A DAY 05/05/14   Tammi Sou, MD  furosemide (LASIX) 40 MG tablet Take 1 tablet (40 mg total) by mouth daily. 05/17/14   Tammi Sou, MD  gabapentin (NEURONTIN) 300 MG capsule TAKE ONE CAPSULE BY MOUTH THREE TIMES DAILY 12/21/13   Tammi Sou, MD  glucose blood (ONE TOUCH ULTRA TEST) test strip TEST BLOOD SUGAR LEVELS THREE TIMES DAILY 01/08/14   Tammi Sou, MD  HYDROcodone-acetaminophen (NORCO/VICODIN) 5-325 MG per tablet  Take 1 tablet by mouth every 6 (six) hours as needed for moderate pain. 08/29/13   Carmin Muskrat, MD  ibandronate (BONIVA) 150 MG tablet TAKE 1 TABLET EVERY MONTH ON THE SAME DAY 06/04/14   Tammi Sou, MD  insulin aspart protamine - aspart (NOVOLOG MIX 70/30 FLEXPEN) (70-30) 100 UNIT/ML FlexPen 34 U SQ bid 06/10/14   Tammi Sou, MD  ipratropium (ATROVENT) 0.02 % nebulizer solution Take 2.5 mLs (500 mcg total) by nebulization 4 (four) times daily. 06/07/11   Noralee Space, MD  isosorbide mononitrate (IMDUR) 30 MG 24 hr tablet TAKE 1 TABLET DAILY 06/18/14   Tammi Sou, MD  losartan (COZAAR) 50 MG tablet TAKE ONE TABLET BY MOUTH ONCE DAILY 06/04/14   Tammi Sou, MD  nitroGLYCERIN (NITROSTAT) 0.4 MG SL tablet Place 1 tablet (0.4 mg total) under the tongue every 5 (five) minutes as needed. May repeat x3 07/23/12   Imogene Burn, PA-C  omeprazole (PRILOSEC) 20 MG capsule TAKE ONE CAPSULE BY MOUTH ONCE DAILY 30 MINUTES BEFORE A MEAL 03/09/14   Tammi Sou, MD  ONE [facility] LANCETS MISC Check blood sugars two times daily 10/21/12   Noralee Space, MD  pioglitazone (ACTOS) 15 MG tablet TAKE 1 TABLET DAILY 04/06/14   Tammi Sou, MD  predniSONE (DELTASONE) 20 MG tablet 3 tabs po qd x 4d, then 2 tabs po qd x 4d, then 1 tab po  qd x 4d 05/17/14   Tammi Sou, MD  Probiotic Product (PROBIOTIC DAILY PO) Take 1 tablet by mouth daily.     Historical Provider, MD  simvastatin (ZOCOR) 80 MG tablet TAKE ONE TABLET AT BEDTIME 05/05/14   Tammi Sou, MD  traMADol (ULTRAM) 50 MG tablet 1-2 tabs po q8h prn pain 05/05/14   Tammi Sou, MD  TRUEPLUS INSULIN SYRINGE 30G X 5/16" 0.5 ML MISC USE 3 TIMES A DAY AS DIRECTED 03/24/14   Tammi Sou, MD  vitamin B-12 (CYANOCOBALAMIN) 500 MCG tablet Take 500 mcg by mouth daily.     Historical Provider, MD  vitamin C (ASCORBIC ACID) 500 MG tablet Take 500 mg by mouth daily.    Historical Provider, MD    Inpatient Medications:     Allergies:  Allergies  Allergen Reactions  . Codeine Nausea And Vomiting    vomiting  . Terbutaline Sulfate     REACTION: INTOL to terbutaline    History   Social History  . Marital Status: Widowed    Spouse Name: N/A  . Number of Children: N/A  . Years of Education: N/A   Occupational History  . Not on file.   Social History Main Topics  . Smoking status: Former Smoker -- 1.00 packs/day for 30 years    Types: Cigarettes    Quit date: 03/05/1998  . Smokeless tobacco: Not on file  . Alcohol Use: No  . Drug Use: No  . Sexual Activity: Yes    Birth Control/ Protection: Surgical   Other Topics Concern  . Not on file   Social History Narrative   Widow, lives alone x 17 yrs, in [place].   One son lives near her, daughter Jackelyn Poling lives in [place].  She drives.   Worked in [employer] until about around age 46--due to chronic right shoulder pain/lung problems.   Tobacco 60 pack-yr hx, quit 2002 when she had an MI.   No alcohol.  No drugs.  Family History  Problem Relation Age of Onset  . Coronary artery disease Other      Review of Systems: All other systems reviewed and are otherwise negative except as noted above.  Labs: See above  Radiology/Studies:  See above   Wt Readings from Last 3  Encounters:  06/23/14 230 lb (104.327 kg)  05/21/14 233 lb (105.688 kg)  05/17/14 235 lb (106.595 kg)    EKG: NSR with incomplete LBBB per cardiology consult at [facility] earlier today. Repeat here shows EKG borderline sinus tach 99bpm, LAD, LVH with QRS widening and repol abnormality, cannot rule out septal infarct age undetermined. No sig change from prior aside from faster rate. QTc 57m but with NSIVCD.  Physical Exam: Blood pressure 119/52, pulse 98, temperature 99.6 F (37.6 C), temperature source Oral, resp. rate 22, height _0  (1.626 m), weight 230 lb (104.327 kg), SpO2 95 %.  General: Well developed obese F in no acute distress. Laying 20 degrees in bed without respiratory distress Head: Normocephalic, atraumatic, sclera non-icteric, no xanthomas, nares are without discharge.  Neck:  JVD not elevated. Lungs: Significant audible wheezing throughout, diminished BS at bases, without rales or rhonchi. Breathing is unlabored. Heart: Reg rhythm, slightly elevated rate with S1 S2. No murmurs, rubs, or gallops appreciated. Abdomen: Soft, non-tender, non-distended with normoactive bowel sounds. No hepatomegaly. No rebound/guarding. No obvious abdominal masses. Msk:  Strength and tone appear normal for age. Extremities: No clubbing or cyanosis. No edema. Distal pedal pulses are 2+ and equal bilaterally. Neuro: Alert and oriented X 3. No facial asymmetry. No focal deficit. Moves all extremities spontaneously. Psych:  Responds to questions appropriately with a normal affect.   Assessment and Plan:   1. Acute on chronic hypercarbic respiratory failure 2. Severe COPD on home O2 3. Mild acute systolic CHF exacerbation, improved, EF 35-40% in 2014 4. CAD s/p remote PCI, with NSTEMI this admission, unclear if demand versus ACS 5. Severe hyperglycemia, suspected uncontrolled DM 6. [facility] 7. Low grade temperature elevation 8. Leukocytosis 9. Morbid  obesity Body mass index is 39.46 kg/(m^2).  Initial presentation more consistent with COPD exacerbation and pulmonary process, however, cannot exclude that she has a concomitant ACS. She does have significant known CAD. At this time given multiple medical issues including blood sugars >500, AKI, and possible low-grade fever we have recommended internal medicine admission and we will follow in consultation. Note impressive polypharmacy on admit with 30 medications/supplements listed. This still needs to be verified by pharmacy. [facility] markedly volume overloaded and wheezing is out of proportion to any CHF on exam. She has no LEE. Will place on heparin per pharmacy. She denies active chest pain. When renal function improves, may need cath. Check 2D echo. Repeat CXR. [facility]  Signed, DMelina CopaPA-C 06/23/2014, 6:40 PM Pager: [phone]

## 2014-06-24 ENCOUNTER — Encounter (HOSPITAL_COMMUNITY): Payer: Self-pay | Admitting: Anesthesiology

## 2014-06-24 ENCOUNTER — Other Ambulatory Visit: Payer: Self-pay

## 2014-06-24 ENCOUNTER — Inpatient Hospital Stay (HOSPITAL_COMMUNITY): Payer: PPO

## 2014-06-24 DIAGNOSIS — J9622 Acute and chronic respiratory failure with hypercapnia: Secondary | ICD-10-CM

## 2014-06-24 LAB — CBC
HCT: 43.2 % (ref 36.0–46.0)
HEMATOCRIT: 40.4 % (ref 36.0–46.0)
Hemoglobin: 13.2 g/dL (ref 12.0–15.0)
Hemoglobin: 14 g/dL (ref 12.0–15.0)
MCH: 30.2 pg (ref 26.0–34.0)
MCH: 30.3 pg (ref 26.0–34.0)
MCHC: 32.7 g/dL (ref 30.0–36.0)
MCHC: 32.4 g/dL (ref 30.0–36.0)
MCV: 92.4 fL (ref 78.0–100.0)
MCV: 93.5 fL (ref 78.0–100.0)
Platelets: 186 10*3/uL (ref 150–400)
Platelets: 213 10*3/uL (ref 150–400)
RBC: 4.37 MIL/uL (ref 3.87–5.11)
RBC: 4.62 MIL/uL (ref 3.87–5.11)
RDW: 12.1 % (ref 11.5–15.5)
RDW: 12.3 % (ref 11.5–15.5)
WBC: 15.1 10*3/uL — ABNORMAL HIGH (ref 4.0–10.5)
WBC: 15.5 10*3/uL — AB (ref 4.0–10.5)

## 2014-06-24 LAB — TROPONIN I
Troponin I: 4.96 ng/mL (ref <0.031)
Troponin I: 3.46 ng/mL (ref <0.031)
Troponin I: 5.26 ng/mL (ref <0.031)
Troponin I: 4.63 ng/mL (ref <0.031)

## 2014-06-24 LAB — BASIC METABOLIC PANEL
Anion gap: 15 (ref 5–15)
BUN: 48 mg/dL — AB (ref 6–23)
CO2: 28 mmol/L (ref 19–32)
Calcium: 9.1 mg/dL (ref 8.4–10.5)
Chloride: 95 mmol/L — ABNORMAL LOW (ref 96–112)
Creatinine, Ser: 1.65 mg/dL — ABNORMAL HIGH (ref 0.50–1.10)
GFR calc Af Amer: 34 mL/min — ABNORMAL LOW (ref >90)
GFR calc non Af Amer: 30 mL/min — ABNORMAL LOW (ref >90)
GLUCOSE: 453 mg/dL — AB (ref 70–99)
Potassium: 4.6 mmol/L (ref 3.5–5.1)
Sodium: 138 mmol/L (ref 135–145)

## 2014-06-24 LAB — HEPATIC FUNCTION PANEL
ALT: 34 U/L (ref 0–35)
AST: 39 U/L — ABNORMAL HIGH (ref 0–37)
Albumin: 3.4 g/dL — ABNORMAL LOW (ref 3.5–5.2)
Alkaline Phosphatase: 61 U/L (ref 39–117)
BILIRUBIN DIRECT: 0.1 mg/dL (ref 0.0–0.5)
Indirect Bilirubin: 0.4 mg/dL (ref 0.3–0.9)
Total Bilirubin: 0.5 mg/dL (ref 0.3–1.2)
Total Protein: 6.4 g/dL (ref 6.0–8.3)

## 2014-06-24 LAB — BLOOD GAS, ARTERIAL
ACID-BASE EXCESS: 1.2 mmol/L (ref 0.0–2.0)
Bicarbonate: 25.8 mEq/L — ABNORMAL HIGH (ref 20.0–24.0)
DELIVERY SYSTEMS: POSITIVE
Drawn by: 257081
Expiratory PAP: 6
FIO2: 0.4 %
Inspiratory PAP: 12
MODE: POSITIVE
O2 Saturation: 98.2 %
PCO2 ART: 45.3 mmHg — AB (ref 35.0–45.0)
Patient temperature: 98.6
TCO2: 27.2 mmol/L (ref 0–100)
pH, Arterial: 7.374 (ref 7.350–7.450)
pO2, Arterial: 113 mmHg — ABNORMAL HIGH (ref 80.0–100.0)

## 2014-06-24 LAB — POCT I-STAT 3, ART BLOOD GAS (G3+)
ACID-BASE EXCESS: 6 mmol/L — AB (ref 0.0–2.0)
Acid-Base Excess: 6 mmol/L — ABNORMAL HIGH (ref 0.0–2.0)
Bicarbonate: 34.9 mEq/L — ABNORMAL HIGH (ref 20.0–24.0)
Bicarbonate: 32.7 mEq/L — ABNORMAL HIGH (ref 20.0–24.0)
O2 SAT: 94 %
O2 Saturation: 97 %
PCO2 ART: 66.3 mmHg — AB (ref 35.0–45.0)
Patient temperature: 98.2
TCO2: 37 mmol/L (ref 0–100)
TCO2: 34 mmol/L (ref 0–100)
pCO2 arterial: 52.2 mmHg — ABNORMAL HIGH (ref 35.0–45.0)
pH, Arterial: 7.328 — ABNORMAL LOW (ref 7.350–7.450)
pH, Arterial: 7.404 (ref 7.350–7.450)
pO2, Arterial: 78 mmHg — ABNORMAL LOW (ref 80.0–100.0)
pO2, Arterial: 93 mmHg (ref 80.0–100.0)

## 2014-06-24 LAB — GLUCOSE, CAPILLARY
GLUCOSE-CAPILLARY: 271 mg/dL — AB (ref 70–99)
Glucose-Capillary: 454 mg/dL — ABNORMAL HIGH (ref 70–99)
Glucose-Capillary: 430 mg/dL — ABNORMAL HIGH (ref 70–99)
Glucose-Capillary: 377 mg/dL — ABNORMAL HIGH (ref 70–99)
Glucose-Capillary: 341 mg/dL — ABNORMAL HIGH (ref 70–99)

## 2014-06-24 LAB — HEPARIN LEVEL (UNFRACTIONATED)
HEPARIN UNFRACTIONATED: 1.9 [IU]/mL — AB (ref 0.30–0.70)
HEPARIN UNFRACTIONATED: 0.88 [IU]/mL — AB (ref 0.30–0.70)
Heparin Unfractionated: 0.66 IU/mL (ref 0.30–0.70)

## 2014-06-24 LAB — PROCALCITONIN: Procalcitonin: 2.25 ng/mL

## 2014-06-24 LAB — LACTIC ACID, PLASMA: LACTIC ACID, VENOUS: 1.8 mmol/L (ref 0.5–2.0)

## 2014-06-24 LAB — BRAIN NATRIURETIC PEPTIDE: B Natriuretic Peptide: 250.9 pg/mL — ABNORMAL HIGH (ref 0.0–100.0)

## 2014-06-24 MED ORDER — CETYLPYRIDINIUM CHLORIDE 0.05 % MT LIQD
7.0000 mL | Freq: Two times a day (BID) | OROMUCOSAL | Status: DC
  Administered 2014-06-25 – 2014-06-27 (×5): 7 mL via OROMUCOSAL

## 2014-06-24 MED ORDER — NITROGLYCERIN IN D5W 200-5 MCG/ML-% IV SOLN
0.0000 ug/min | INTRAVENOUS | Status: DC
Start: 2014-06-24 — End: 2014-06-25
  Administered 2014-06-24: 90 ug/min via INTRAVENOUS
  Administered 2014-06-24: 5 ug/min via INTRAVENOUS
  Administered 2014-06-25: 80 ug/min via INTRAVENOUS
  Filled 2014-06-24 (×2): qty 250

## 2014-06-24 MED ORDER — FENTANYL CITRATE (PF) 100 MCG/2ML IJ SOLN
INTRAMUSCULAR | Status: AC
  Administered 2014-06-24: 100 ug
  Filled 2014-06-24: qty 4

## 2014-06-24 MED ORDER — MIDAZOLAM HCL 2 MG/2ML IJ SOLN
INTRAMUSCULAR | Status: AC
  Administered 2014-06-24: 2 mg
  Filled 2014-06-24: qty 4

## 2014-06-24 MED ORDER — INSULIN GLARGINE 100 UNIT/ML ~~LOC~~ SOLN
20.0000 [IU] | Freq: Two times a day (BID) | SUBCUTANEOUS | Status: DC
  Administered 2014-06-24 – 2014-06-28 (×10): 20 [IU] via SUBCUTANEOUS
  Filled 2014-06-24 (×11): qty 0.2

## 2014-06-24 MED ORDER — HYDRALAZINE HCL 20 MG/ML IJ SOLN
INTRAMUSCULAR | Status: AC
  Filled 2014-06-24: qty 1

## 2014-06-24 MED ORDER — FENTANYL CITRATE (PF) 100 MCG/2ML IJ SOLN
12.5000 ug | INTRAMUSCULAR | Status: DC | PRN
  Administered 2014-06-24 – 2014-06-25 (×5): 50 ug via INTRAVENOUS
  Administered 2014-06-26: 25 ug via INTRAVENOUS
  Administered 2014-06-26: 50 ug via INTRAVENOUS
  Filled 2014-06-24 (×7): qty 2

## 2014-06-24 MED ORDER — HEPARIN (PORCINE) IN NACL 100-0.45 UNIT/ML-% IJ SOLN
750.0000 [IU]/h | INTRAMUSCULAR | Status: DC
  Administered 2014-06-24: 750 [IU]/h via INTRAVENOUS
  Filled 2014-06-24: qty 250

## 2014-06-24 MED ORDER — MUPIROCIN 2 % EX OINT
1.0000 "application " | TOPICAL_OINTMENT | Freq: Two times a day (BID) | CUTANEOUS | Status: AC
  Administered 2014-06-24 – 2014-06-29 (×10): 1 via NASAL
  Filled 2014-06-24 (×2): qty 22

## 2014-06-24 MED ORDER — NITROGLYCERIN 0.4 MG SL SUBL
SUBLINGUAL_TABLET | SUBLINGUAL | Status: AC
  Filled 2014-06-24: qty 1

## 2014-06-24 MED ORDER — ROCURONIUM BROMIDE 50 MG/5ML IV SOLN
INTRAVENOUS | Status: AC
  Administered 2014-06-24: 50 mg
  Filled 2014-06-24: qty 2

## 2014-06-24 MED ORDER — IPRATROPIUM-ALBUTEROL 0.5-2.5 (3) MG/3ML IN SOLN
3.0000 mL | Freq: Four times a day (QID) | RESPIRATORY_TRACT | Status: DC
  Administered 2014-06-24 – 2014-06-27 (×13): 3 mL via RESPIRATORY_TRACT
  Filled 2014-06-24 (×15): qty 3

## 2014-06-24 MED ORDER — CARVEDILOL 3.125 MG PO TABS
3.1250 mg | ORAL_TABLET | Freq: Two times a day (BID) | ORAL | Status: DC
  Administered 2014-06-25: 3.125 mg via ORAL
  Filled 2014-06-24 (×4): qty 1

## 2014-06-24 MED ORDER — CHLORHEXIDINE GLUCONATE CLOTH 2 % EX PADS
6.0000 | MEDICATED_PAD | Freq: Every day | CUTANEOUS | Status: AC
  Administered 2014-06-25 – 2014-06-29 (×5): 6 via TOPICAL

## 2014-06-24 MED ORDER — INSULIN ASPART 100 UNIT/ML ~~LOC~~ SOLN
0.0000 [IU] | Freq: Three times a day (TID) | SUBCUTANEOUS | Status: DC
  Administered 2014-06-24: 15 [IU] via SUBCUTANEOUS
  Administered 2014-06-24: 8 [IU] via SUBCUTANEOUS
  Administered 2014-06-25: 15 [IU] via SUBCUTANEOUS

## 2014-06-24 MED ORDER — INSULIN ASPART 100 UNIT/ML ~~LOC~~ SOLN: 3.0000 [IU] | Freq: Three times a day (TID) | SUBCUTANEOUS | Status: DC

## 2014-06-24 MED ORDER — HEPARIN (PORCINE) IN NACL 100-0.45 UNIT/ML-% IJ SOLN
900.0000 [IU]/h | INTRAMUSCULAR | Status: DC
  Administered 2014-06-24: 900 [IU]/h via INTRAVENOUS
  Filled 2014-06-24: qty 250

## 2014-06-24 MED ORDER — ATORVASTATIN CALCIUM 40 MG PO TABS
40.0000 mg | ORAL_TABLET | Freq: Every day | ORAL | Status: DC
  Administered 2014-06-25 – 2014-06-28 (×3): 40 mg via ORAL
  Filled 2014-06-24 (×6): qty 1

## 2014-06-24 MED ORDER — NITROGLYCERIN 2 % TD OINT: 1.0000 [in_us] | TOPICAL_OINTMENT | Freq: Four times a day (QID) | TRANSDERMAL | Status: DC

## 2014-06-24 MED ORDER — METHYLPREDNISOLONE SODIUM SUCC 125 MG IJ SOLR
80.0000 mg | Freq: Four times a day (QID) | INTRAMUSCULAR | Status: DC
  Administered 2014-06-24 – 2014-06-26 (×8): 80 mg via INTRAVENOUS
  Filled 2014-06-24 (×5): qty 1.28
  Filled 2014-06-24: qty 2
  Filled 2014-06-24: qty 1.28
  Filled 2014-06-24 (×3): qty 2
  Filled 2014-06-24: qty 1.28
  Filled 2014-06-24: qty 2
  Filled 2014-06-24: qty 1.28

## 2014-06-24 MED ORDER — NITROGLYCERIN 0.4 MG SL SUBL
0.4000 mg | SUBLINGUAL_TABLET | SUBLINGUAL | Status: DC | PRN
  Administered 2014-06-24: 0.4 mg via SUBLINGUAL

## 2014-06-24 MED ORDER — INSULIN ASPART 100 UNIT/ML ~~LOC~~ SOLN
0.0000 [IU] | Freq: Every day | SUBCUTANEOUS | Status: DC
  Administered 2014-06-24: 4 [IU] via SUBCUTANEOUS

## 2014-06-24 MED ORDER — ETOMIDATE 2 MG/ML IV SOLN
INTRAVENOUS | Status: AC
  Administered 2014-06-24: 10 mg
  Filled 2014-06-24: qty 20

## 2014-06-24 MED ORDER — NITROGLYCERIN IN D5W 200-5 MCG/ML-% IV SOLN
INTRAVENOUS | Status: AC
  Filled 2014-06-24: qty 250

## 2014-06-24 MED ORDER — FUROSEMIDE 10 MG/ML IJ SOLN
60.0000 mg | Freq: Once | INTRAMUSCULAR | Status: AC
  Administered 2014-06-24: 60 mg via INTRAVENOUS

## 2014-06-24 MED ORDER — ISOSORBIDE MONONITRATE ER 30 MG PO TB24
30.0000 mg | ORAL_TABLET | Freq: Every day | ORAL | Status: DC
  Administered 2014-06-26 – 2014-06-29 (×4): 30 mg via ORAL
  Filled 2014-06-24 (×4): qty 1

## 2014-06-24 MED ORDER — METOPROLOL TARTRATE 1 MG/ML IV SOLN
2.5000 mg | INTRAVENOUS | Status: DC | PRN
  Administered 2014-06-24 (×2): 5 mg via INTRAVENOUS
  Filled 2014-06-24 (×2): qty 5

## 2014-06-24 MED ORDER — HYDRALAZINE HCL 20 MG/ML IJ SOLN
10.0000 mg | Freq: Four times a day (QID) | INTRAMUSCULAR | Status: DC | PRN
  Administered 2014-06-24 – 2014-06-27 (×2): 10 mg via INTRAVENOUS
  Filled 2014-06-24 (×2): qty 1

## 2014-06-24 MED ORDER — SUCCINYLCHOLINE CHLORIDE 20 MG/ML IJ SOLN
INTRAMUSCULAR | Status: AC
  Filled 2014-06-24: qty 1

## 2014-06-24 MED ORDER — FUROSEMIDE 10 MG/ML IJ SOLN
INTRAMUSCULAR | Status: AC
  Filled 2014-06-24: qty 8

## 2014-06-24 MED ORDER — LIDOCAINE HCL (CARDIAC) 20 MG/ML IV SOLN
INTRAVENOUS | Status: AC
  Filled 2014-06-24: qty 5

## 2014-06-24 MED ORDER — CHLORHEXIDINE GLUCONATE 0.12 % MT SOLN
15.0000 mL | Freq: Two times a day (BID) | OROMUCOSAL | Status: DC
  Administered 2014-06-24 – 2014-06-29 (×9): 15 mL via OROMUCOSAL
  Filled 2014-06-24 (×12): qty 15

## 2014-06-24 MED ORDER — FUROSEMIDE 10 MG/ML IJ SOLN
60.0000 mg | Freq: Once | INTRAMUSCULAR | Status: AC
  Administered 2014-06-24: 60 mg via INTRAVENOUS
  Filled 2014-06-24: qty 6

## 2014-06-24 MED ORDER — INSULIN ASPART 100 UNIT/ML ~~LOC~~ SOLN
18.0000 [IU] | Freq: Once | SUBCUTANEOUS | Status: AC
  Administered 2014-06-24: 18 [IU] via SUBCUTANEOUS

## 2014-06-24 NOTE — Progress Notes (Signed)
Pateint placed on BiPAP per MD during rapid response. 12/6 and 40%. ABG obtained. PT stated she is feeling better. RT will continue to monitor

## 2014-06-24 NOTE — Progress Notes (Signed)
Patient Demographics  [name], is a 74 y.o. female, DOB - 1940/05/17, EAV:[id number]  Admit date - 06/23/2014   Admitting Physician Rollene Rotunda, MD  Outpatient Primary MD for the patient is Jeoffrey Massed, MD  LOS - 1   No chief complaint on file.       Subjective:   [name] today has, No headache, No chest pain, No abdominal pain - No Nausea, No new weakness tingling or numbness, No Cough - ++ SOB.    Assessment & Plan    Patient accepted last night from [facility] by cardiology for non-STEMI, acute renal failure, COPD exacerbation requiring BiPAP. Apparently her troponin was 20 at outlying facility. She was accepted and admitted by Dr. Antoine Poche and transferred under my service this morning.  Went to see the patient this morning and she was fine around 7:15 AM, she was on heparin drip along with IV Solu-Medrol and nebulizer treatments. After 15 minutes of my seeing the patient she went into acute respiratory distress, she was breathing around 35 for minute, blood pressure was 190/100, and she was in sinus tachycardia in 130s.  Immediately placed the patient on BiPAP, give her IV Lasix and IV Solu-Medrol, nebulizer treatment, cardiology was called, EKG was reexamined which showed sinus tachycardia with underlying conduction abnormality unchanged from admission. Troponin was cycled. Since patient now off and on BiPAP for close to 24 hours including the time at [facility] case was discussed with cardiology, they insisted that they might put the patient on amiodarone drip and that patient will be best served in ICU. Dr. Marchelle Gearing was consulted who was present on the floor. Patient being transferred to ICU under his care. Repeat chest x-ray which was done early in the morning prior to  respiratory distress does not show pulmonary edema or any infiltrate. She does have leukocytosis but this could be due to steroids and stress.     Code Status: Full  Family Communication: Family bedside  Disposition Plan: ICU   Procedures TTE   Consults  Cards, PCCM   Medications  Scheduled Meds: . aspirin EC  81 mg Oral Daily  . atorvastatin  40 mg Oral q1800  . carvedilol  3.125 mg Oral BID WC  . hydrALAZINE      . insulin aspart  0-15 Units Subcutaneous TID WC  . insulin aspart  0-5 Units Subcutaneous QHS  . insulin aspart  3 Units Subcutaneous TID WC  . insulin glargine  20 Units Subcutaneous BID  . ipratropium-albuterol  3 mL Nebulization Q4H  . [START ON 06/26/2014] isosorbide mononitrate  30 mg Oral Daily  . [START ON 06/25/2014] levofloxacin (LEVAQUIN) IV  750 mg Intravenous Q48H  . methylPREDNISolone (SOLU-MEDROL) injection  80 mg Intravenous Q6H  . nitroGLYCERIN      . sodium chloride  3 mL Intravenous Q12H   Continuous Infusions: . heparin    . nitroGLYCERIN 5 mcg/min (06/24/14 0914)   PRN Meds:.sodium chloride, albuterol, ALPRAZolam, guaiFENesin-dextromethorphan, hydrALAZINE, nitroGLYCERIN, ondansetron **OR** ondansetron (ZOFRAN) IV, sodium chloride  DVT Prophylaxis    Heparin  gtt  Lab Results  Component Value Date   PLT 186 06/24/2014    Antibiotics     Anti-infectives    Start  Dose/Rate Route Frequency Ordered Stop   06/25/14 2200  levofloxacin (LEVAQUIN) IVPB 750 mg    Comments:  Levaquin 750 mg IV q48h for CrCl< 50 mL/min   750 mg 100 mL/hr over 90 Minutes Intravenous Every 48 hours 06/23/14 2315     06/23/14 2045  levofloxacin (LEVAQUIN) IVPB 750 mg     750 mg 100 mL/hr over 90 Minutes Intravenous  Once 06/23/14 2002 06/23/14 2340          Objective:   Filed Vitals:   06/24/14 0841 06/24/14 0843 06/24/14 0900 06/24/14 0905  BP: 142/72 192/116 128/73 139/78  Pulse: 128 75 126   Temp:      TempSrc:      Resp: 38 27 26 27     Height:      Weight:      SpO2: 97% 82% 98%     Wt Readings from Last 3 Encounters:  06/23/14 104.327 kg (230 lb)  05/21/14 105.688 kg (233 lb)  05/17/14 106.595 kg (235 lb)     Intake/Output Summary (Last 24 hours) at 06/24/14 1001 Last data filed at 06/23/14 2227  Gross per 24 hour  Intake      0 ml  Output    725 ml  Net   -725 ml     Physical Exam  Awake Alert, Oriented X 3, No new F.N deficits, Normal affect Xenia.AT,PERRAL Supple Neck,No JVD, No cervical lymphadenopathy appriciated.  Symmetrical Chest wall movement, minimal air movement bilaterally, wheezing few rales RRR,No Gallops,Rubs or new Murmurs, No Parasternal Heave +ve B.Sounds, Abd Soft, No tenderness, No organomegaly appriciated, No rebound - guarding or rigidity. No Cyanosis, Clubbing or edema, No new Rash or bruise     Data Review   Micro Results Recent Results (from the past 240 hour(s))  MRSA PCR Screening     Status: Abnormal   Collection Time: 06/23/14  5:23 PM  Result Value Ref Range Status   MRSA by PCR POSITIVE (A) NEGATIVE Final    Comment:        The GeneXpert MRSA Assay (FDA approved for NASAL specimens only), is one component of a comprehensive MRSA colonization surveillance program. It is not intended to diagnose MRSA infection nor to guide or monitor treatment for MRSA infections. RESULT CALLED TO, READ BACK BY AND VERIFIED WITH: Earmon PhoenixS COOPER RN 2145 06/23/14 A BROWNING     Radiology Reports Dg Chest 2 View  06/23/2014   CLINICAL DATA:  74 year old female with a history of shortness of breath for 3 days  EXAM: CHEST - 2 VIEW  COMPARISON:  Plain film 08/29/2013, 06/22/2014  FINDINGS: Cardiomediastinal silhouette unchanged in size and contour. Coronary stents in place.  Similar appearance of mild interstitial and airspace opacities.  No pleural effusion or pneumothorax.  No displaced fracture.  Unremarkable appearance of the upper abdomen.  IMPRESSION: Mixed interstitial and airspace  opacities are nonspecific, potentially representing edema, atelectasis, and/ or atypical infection/consolidation.  Coronary artery disease.  Signed,  Yvone NeuJaime S. Loreta AveWagner, DO  [facility]  [facility]   Electronically Signed   By: Gilmer MorJaime  Wagner D.O.   On: 06/23/2014 22:03   Dg Chest Port 1 View  06/24/2014   CLINICAL DATA:  Acute onset shortness of breath and weakness  EXAM: PORTABLE CHEST - 1 VIEW  COMPARISON:  06/23/2014  FINDINGS: Stable cardiomegaly with improving bibasilar atelectasis. No current pneumonia, edema, collapse or consolidation. No effusion or pneumothorax. Trachea midline. Degenerative changes of the spine and shoulders.  IMPRESSION: cardiomegaly with improving bibasilar atelectasis.   Electronically Signed   By: Judie Petit.  Shick M.D.   On: 06/24/2014 08:40     CBC  Recent Labs Lab 06/23/14 2044 06/24/14 0401  WBC 17.6* 15.1*  HGB 13.4 13.2  HCT 41.9 40.4  PLT 234 186  MCV 93.5 92.4  MCH 29.9 30.2  MCHC 32.0 32.7  RDW 12.1 12.1  LYMPHSABS 1.1  --   MONOABS 1.3*  --   EOSABS 0.0  --   BASOSABS 0.0  --     Chemistries   Recent Labs Lab 06/23/14 2044  NA 133*  K 4.2  CL 91*  CO2 32  GLUCOSE 380*  BUN 47*  CREATININE 1.95*  CALCIUM 8.9   ------------------------------------------------------------------------------------------------------------------ estimated creatinine clearance is 30.2 mL/min (by C-G formula based on Cr of 1.95). ------------------------------------------------------------------------------------------------------------------ No results for input(s): HGBA1C in the last 72 hours. ------------------------------------------------------------------------------------------------------------------ No results for input(s): CHOL, HDL, LDLCALC, TRIG, CHOLHDL, LDLDIRECT in the last 72 hours. ------------------------------------------------------------------------------------------------------------------ No  results for input(s): TSH, T4TOTAL, T3FREE, THYROIDAB in the last 72 hours.  Invalid input(s): FREET3 ------------------------------------------------------------------------------------------------------------------ No results for input(s): VITAMINB12, FOLATE, FERRITIN, TIBC, IRON, RETICCTPCT in the last 72 hours.  Coagulation profile No results for input(s): INR, PROTIME in the last 168 hours.  No results for input(s): DDIMER in the last 72 hours.  Cardiac Enzymes  Recent Labs Lab 06/23/14 2044 06/24/14 0401  TROPONINI 9.89* 4.96*   ------------------------------------------------------------------------------------------------------------------ Invalid input(s): POCBNP     Time Spent in minutes   35   Ashaun Gaughan K M.D on 06/24/2014 at 10:01 AM  Between 7am to 7pm - Pager - [phone]  After 7pm go to [email or web address] - password [facility]  [facility]   Office  [phone]

## 2014-06-24 NOTE — Procedures (Signed)
Intubation Procedure Note [name] [id number] 04/23/1940  Procedure: Intubation Indications: Respiratory insufficiency  Procedure Details Consent: Risks of procedure as well as the alternatives and risks of each were explained to the (patient/caregiver).  Consent for procedure obtained. Time Out: Verified patient identification, verified procedure, site/side was marked, verified correct patient position, special equipment/implants available, medications/allergies/relevent history reviewed, required imaging and test results available.  Performed  Maximum sterile technique was used including gloves, hand hygiene and mask.  MAC    Evaluation Hemodynamic Status: BP stable throughout; O2 sats: stable throughout Patient's Current Condition: stable Complications: No apparent complications Patient did tolerate procedure well. Chest X-ray ordered to verify placement.  CXR: pending.   [name],[name] 06/24/2014

## 2014-06-24 NOTE — Significant Event (Signed)
Rapid Response Event Note  Overview:  Called to assist with patient in resp distress Time Called: 0835 Arrival Time: 0840 Event Type: Respiratory  Initial Focused Assessment:  On arrival patient supine in bed - HOB elevated - grey color - alert - unable to speak sentence- RR 45 - skin cool and moist - bil BS with very little movement - has been placed on Nebulizer - abd large soft - Foley patent - pink tinged urine - no pedal edema - BP 192/116 = using accessory muscles to breathe.  RN states this came on suddenly post movement for PCXR.  Wears 2 liters nasal cannula at home as baseline per family.  HR 145 with wide complex - RN [name] states this is different.  Patient denies CP or pressure - nods yes to "just can't breathe."   Interventions:  Stat call for Bipap and 12 lead EKG.  Dr. Thedore MinsSingh present - orders for Lasix 60 mg IV - SL NTG one tab - and initiate Bipap.  Lasix and NTG given per Torie RN - see MAR.  RT [name] present - Bipap initiated at 12/6 - patient tolerated well - decreased WOB.  Theodore Demarkhonda Barrett NP with cards present.  NTG drip started at 5mcg - will watch BP. ABG drawn.  2nd IV started right hand x one attempt - #20 angio - to NSL.   BP after NTG and Bipap - 145/72 HR 132 - RR 24 - O2sats 96%.  Resting with Bipap - 108/53 HR 136 - will watch.  PCCM Dr. Marchelle Gearingamaswamy in to see patient - relaxed - now BP 128/73 HR 126 RR 22 O2 sats 98%.  Bil BS now with soft exp wheeze - few crackles in bases - diuresing 650 cc pink tinged urine.  Patient resting - able to speak full sentence.  Continues to deny CP or pressure - no nausea.  Transferred to 2H06 - report by Torie RN to [employer]isys CorporationMildred RN.  Family updated.    Event Summary: Name of Physician Notified: Dr. Thedore MinsSingh at  (pta RRT)  Name of Consulting Physician Notified: Margaret Pickhonda Barret NP         Dr. Marchelle Gearingamaswamy at  (by Dr. Thedore MinsSingh)  Outcome: Transferred (Comment) [phone](2h06)  Event End Time: 1000  Margaret PrairieBritt, [name]

## 2014-06-24 NOTE — Progress Notes (Signed)
Patient Name: [name] Date of Encounter: 06/24/2014  Principal Problem:   Acute on chronic respiratory failure Active Problems:   Obesity   Essential hypertension, benign   CORONARY ATHEROSCLEROSIS NATIVE CORONARY ARTERY   Cardiomyopathy, ischemic   Diabetes mellitus with complication   NSTEMI (non-ST elevated myocardial infarction)   Acute renal failure   COPD exacerbation   Leukocytosis    Primary Cardiologist: Dr. Diona BrownerMcDowell  Patient Profile: 74 yo female w/ hx of CAD, MI s/p PTCA/stent to LAD (07/1998) with repeat PTCA (02/1999), PTCA/brachytherapy to mLAD (2001), COPD on home O2, anemia, DM, OSA, anxiety, S-CHF admitted as [facility] with acute respiratory failure requiring BiPAP. Treated with 5 nebulizer treatments, subsequently de-escalated to [place]. Troponin 0.38, 15.13, 20.81, 11.97. ECG reportedly with NSR and incomplete LBBB.    SUBJECTIVE: On initial encounter with patient, she reported shortness of breath, improved before [place]. Stated after her admission last night, her shortness of breath progressively worsened. Denied chest pain, palpitations, nausea.    Urgently called to patient's room at approximately 08:40 for respiratory distress, SBP elevated to 190s. Patient placed on BiPAP with subsequent improvement in respiratroy distress. Repeat ECG unchanged from prior, sinus tachycardia with very frequent PACs. Pt given IV lasix and started on nitroglycerin drip with subsequent downtrend of SBP to low 100s. Given respiratory distress, patient to be transferred to CCU for further management. No chest pain.  OBJECTIVE Filed Vitals:   06/23/14 2044 06/23/14 2358 06/24/14 0050 06/24/14 0352  BP: 158/76 114/97 144/97 139/105  Pulse: 105 115 109 101  Temp: 98.5 F (36.9 C) 98.4 F (36.9 C)    TempSrc: Oral Oral    Resp: 20  22 22   Height:      Weight:      SpO2: 97% 98% 98% 95%    Intake/Output Summary (Last 24 hours) at 06/24/14  [id number] Last data filed at 06/23/14 2227  Gross per 24 hour  Intake      0 ml  Output    725 ml  Net   -725 ml   Filed Weights   06/23/14 1715  Weight: 230 lb (104.327 kg)    PHYSICAL EXAM General: Well developed female in respiratory distress. Head: Normocephalic, atraumatic.  Neck: Supple without bruits, JVD not elevated, difficult to assess secondary to body habitus Lungs: Tachypneic, breathing severely labored, on BiPAP; initial lung auscultation revealed inspiratory and expiratory wheezing and decreased breath sound to bases, after nebulizer treatment, rales noted to bases bilateral bases. Heart: Tachycardic, regular rhythm, S1, S2, no S3, S4, or murmur; no rub. Abdomen: Soft, non-tender, non-distended, BS + x 4.  Extremities: No clubbing, cyanosis, no edema.  Neuro: Alert and oriented X 3. Moves all extremities spontaneously. Psych: Normal affect.  LABS: CBC: Recent Labs  06/23/14 2044 06/24/14 0401  WBC 17.6* 15.1*  NEUTROABS 15.3*  --   HGB 13.4 13.2  HCT 41.9 40.4  MCV 93.5 92.4  PLT 234 186   Basic Metabolic Panel: Recent Labs  06/23/14 2044  NA 133*  K 4.2  CL 91*  CO2 32  GLUCOSE 380*  BUN 47*  CREATININE 1.95*  CALCIUM 8.9   Cardiac Enzymes: Recent Labs  06/23/14 2044 06/24/14 0401  TROPONINI 9.89* 4.96*    TELE: SR, 100-130s       ECG: SR, LBBB, 133   Radiology/Studies: Dg Chest Port 1 View 06/24/2014   CLINICAL DATA:  Acute onset shortness of breath and weakness  EXAM:  PORTABLE CHEST - 1 VIEW  COMPARISON:  06/23/2014  FINDINGS: Stable cardiomegaly with improving bibasilar atelectasis. No current pneumonia, edema, collapse or consolidation. No effusion or pneumothorax. Trachea midline. Degenerative changes of the spine and shoulders.  IMPRESSION: cardiomegaly with improving bibasilar atelectasis.   Electronically Signed   By: Judie Petit.  Shick M.D.   On: 06/24/2014 08:40    Dg Chest 2 View 06/23/2014 CLINICAL DATA:  74 year old female with a history  of shortness of breath for 3 days EXAM: CHEST - 2 VIEW COMPARISON:  Plain film 08/29/2013, 06/22/2014 FINDINGS: Cardiomediastinal silhouette unchanged in size and contour. Coronary stents in place.  Similar appearance of mild interstitial and airspace opacities.  No pleural effusion or pneumothorax.  No displaced fracture.  Unremarkable appearance of the upper abdomen. IMPRESSION: Mixed interstitial and airspace opacities are nonspecific, potentially representing edema, atelectasis, and/ or atypical infection/consolidation.  Coronary artery disease.  Signed, Yvone Neu. Loreta Ave, DO [facility]  Electronically Signed By: Gilmer Mor D.O. On: 06/23/2014 22:03    Current Medications:  . aspirin EC  81 mg Oral Daily  . insulin aspart  0-9 Units Subcutaneous TID WC  . ipratropium-albuterol  3 mL Nebulization Q4H  . isosorbide mononitrate  30 mg Oral Daily  . [START ON 06/25/2014] levofloxacin (LEVAQUIN) IV  750 mg Intravenous Q48H  . methylPREDNISolone (SOLU-MEDROL) injection  80 mg Intravenous Q12H  . sodium chloride  3 mL Intravenous Q12H      ASSESSMENT AND PLAN:  Principal Problem:   Acute on chronic respiratory failure - Respiratory distress on exam this morning, on BiPAP with stable O2 sat - Given 60 mg IV Lasix this AM, give more PRN, ck BNP - SBP 190s, started on nitroglycerin drip; subsequent decrease to low 100s, now stable - Due to acute respiratory distress, patient to be transferred to CCU  Active Problems:   NSTEMI (non-ST elevated myocardial infarction) - Denies chest pain today - Troponin at [facility] peak 20.81, now down-trending  - Echo planned for this morning, follow up results - Last cath 07/10/12 demonstrated moderate diffuse proximal LAD stenosis with in-stent restenosis, nonobstructive stenosis of large dominant left Cx, moderately severe stenosis of small nondominant RCA, moderate LV systolic dysfunction - May need  cath when renal function improves (creatinine 1.95, BUN 47 today; last recorded prior to this admission creatinine 1.15 BUN 22 on 01/27/14) - Continue ASA, Imdur - Heparin drip per pharmacy (held  this morning due to elevated heparin level, plan to resume at lower rate)       CORONARY ATHEROSCLEROSIS NATIVE CORONARY ARTERY - See above    Cardiomyopathy, ischemic - See above - Echo pending  Otherwise, per IM/CCM:   Obesity   Essential hypertension, benign   Diabetes mellitus with complication   Acute renal failure   COPD exacerbation   Leukocytosis  Signed, Theodore Demark , PA-C 6:28 AM 06/24/2014

## 2014-06-24 NOTE — Progress Notes (Signed)
Pt with increased work of breathing, mostly abdominal breathing while on BIPAP. RR 25-30, lungs sounds diminished, Sats 92-95%. No changes in LOC. Respiratory therapist at the bedside and ABG drawn. CO2 increased to 67 from 45 this am. IV lasix ordered and continue to monitor for further decline and call back with any changes.

## 2014-06-24 NOTE — Progress Notes (Signed)
eLink Physician-Brief Progress Note Patient Name: [name] DOB: 11/01/1940 MRN: [id number]   Date of Service  06/24/2014  HPI/Events of Note  Increased WOB. Hx of COPD and diastolic heart failure. PCO2 has gone from 45 to 67 (pH = 7.32).   eICU Interventions  Will empirically try another dose of Lasix 60 mg IV now. Follow clinically. Low threshold for intubation.     Intervention Category Intermediate Interventions: Respiratory distress - evaluation and management  [name] 06/24/2014, 3:54 PM

## 2014-06-24 NOTE — Progress Notes (Signed)
ANTICOAGULATION CONSULT NOTE -Follow up Pharmacy Consult for heparin Indication: chest pain/ACS  Allergies  Allergen Reactions  . Codeine Nausea And Vomiting    vomiting  . Terbutaline Sulfate     REACTION: INTOL to terbutaline    Patient Measurements: Height: 5\' 4"  (162.6 cm) Weight: 230 lb (104.327 kg) IBW/kg (Calculated) : 54.7 Heparin Dosing Weight: 79 kg  Vital Signs: Temp: 98 F (36.7 C) (04/21 1000) Temp Source: Axillary (04/21 1000) BP: 125/55 mmHg (04/21 1000) Pulse Rate: 116 (04/21 1000)  Labs:  Recent Labs  06/23/14 2044 06/24/14 0401 06/24/14 0935 06/24/14 0938  HGB 13.4 13.2 14.0  --   HCT 41.9 40.4 43.2  --   PLT 234 186 213  --   HEPARINUNFRC  --  1.90*  --  0.88*  CREATININE 1.95*  --   --   --   TROPONINI 9.89* 4.96*  --   --     Estimated Creatinine Clearance: 30.2 mL/min (by C-G formula based on Cr of 1.95).   Assessment: Initial 8 hour heparin level is 1.9 on 1150 units/hr and about 8 hours after 4000 units bolus.  SUPRAtherapeutic heparin level in this 74 y.o female on heparin for NSEMI.  RN reports that the heparin is infusing in the left hand and the heparin level appears to have been drawn from RUE, thus level should be accurate. No bleeding noted.   Heparin has been on hold since 6:30 am with bleeding at the IV site which has now resolved, stat heparin level still elevated at 0.88 . Goal of Therapy:  Heparin level 0.3-0.7 units/ml Monitor platelets by anticoagulation protocol: Yes   Plan:  Restart heparin at lower rate at 750 units / hr Check 8 hour heparin level Daily heparin level, CBC  Thank you. Okey RegalLisa Alvie Speltz, PharmD [phone] 06/24/2014,11:02 AM

## 2014-06-24 NOTE — Consult Note (Signed)
PULMONARY / CRITICAL CARE MEDICINE   Name: [name] MRN: [id number] DOB: [birth year]    ADMISSION DATE:  06/23/2014 CONSULTATION DATE:  4/21  REFERRING MD :  [facility]   CHIEF COMPLAINT:  Respiratory failure   INITIAL PRESENTATION: 74 yo female former smoker with multiple medical problems including O2 dependent COPD, HTN, CHF, CAD, OSA (noncompliant with CPAP), and DM who was initially admitted to [facility] 4/20 with acute on chronic respiratory failure.  She was placed on bipap and treated with BD, steroids, NTG and diuresis.  She improved initially but troponins were elevated (0.38 -> 15.13 -> 20.81 -> 11.97) and she was tx 4/20 [facility] for cardiology eval.  On 4/21 she developed acutely worsening SOB and was placed back on bipap and PCCM consulted.   STUDIES:  2D echo 4/21>>>  SIGNIFICANT EVENTS:    HISTORY OF PRESENT ILLNESS:  74 yo female former smoker with multiple medical problems including O2 dependent COPD, HTN, CHF, CAD, OSA (noncompliant with CPAP), and DM who was initially admitted to [facility] 4/20 with acute on chronic respiratory failure.  She was placed on bipap and treated with BD, steroids, NTG and diuresis.  She improved initially but troponins were elevated (0.38 -> 15.13 -> 20.81 -> 11.97) and she was tx 4/20 [facility] for cardiology eval.  On 4/21 she developed acutely worsening SOB, HTN with SBP 190's and was placed back on bipap and PCCM consulted.    PAST MEDICAL HISTORY :   has a past medical history of COPD (chronic obstructive pulmonary disease); Hyperlipidemia; Essential hypertension, benign; Anemia; Anxiety; Coronary atherosclerosis of native coronary artery; Osteoporosis; Venous insufficiency; Obesity; Asthma; Herpes zoster (06/2013); Tobacco abuse; Chronic respiratory failure; GERD (gastroesophageal reflux disease); On home oxygen therapy; Myocardial infarction (2000); Anginal pain; Chronic bronchitis; OSA (obstructive sleep apnea); Type 2 diabetes  mellitus; History of blood transfusion (X 1); History of bleeding ulcers; Arthritis; DDD (degenerative disc disease), lumbar; and Osteoarthritis.  has past surgical history that includes Total abdominal hysterectomy; Shoulder open rotator cuff repair (Right); Cataract extraction w/PHACO (Left, 06/30/2012); transthoracic echocardiogram (06/2013); Coronary artery bypass graft; Alveoloplasty (10/1999); Cardiac catheterization (07/2012); Coronary angioplasty with stent; Coronary angioplasty with stent (2000; 2001; 2002); Tonsillectomy (1949); Laparoscopic cholecystectomy; and Cataract extraction w/ intraocular lens implant (Right, 2015?). Prior to Admission medications   Medication Sig Start Date End Date Taking? Authorizing Provider  albuterol (PROVENTIL HFA;VENTOLIN HFA) 108 (90 BASE) MCG/ACT inhaler Inhale 2 puffs into the lungs every 6 (six) hours as needed for wheezing or shortness of breath. 02/02/13   Michele Mcalpine, MD  albuterol (PROVENTIL) (2.5 MG/3ML) 0.083% nebulizer solution Take 3 mLs (2.5 mg total) by nebulization 4 (four) times daily. 06/07/11   Michele Mcalpine, MD  ALPRAZolam Prudy Feeler) 0.5 MG tablet TAKE 1/2 TO 1 TABLET THREE TIMES DAILY AS NEEDED 01/04/14   Jeoffrey Massed, MD  aspirin 81 MG tablet Take 81 mg by mouth daily.    Historical Provider, MD  atenolol (TENORMIN) 50 MG tablet TAKE 1 TABLET ONCE A DAY 05/05/14   Jeoffrey Massed, MD  azithromycin (ZITHROMAX) 250 MG tablet 2 tabs po qd x 1d, then 1 tab po qd x 4d 05/17/14   Jeoffrey Massed, MD  budesonide (PULMICORT) 0.25 MG/2ML nebulizer solution Take 2 mLs (0.25 mg total) by nebulization 2 (two) times daily. 06/07/11   Michele Mcalpine, MD  calcium-vitamin D (OSCAL WITH D) 500-200 MG-UNIT per tablet Take 1 tablet by mouth 2 (two) times daily.  Historical Provider, MD  Cholecalciferol (VITAMIN D3) 1000 UNITS CAPS Take 1 capsule by mouth daily.     Historical Provider, MD  dextromethorphan-guaiFENesin (MUCINEX DM) 30-600 MG per 12 hr tablet Take 2  tablets by mouth every 12 (twelve) hours.      Historical Provider, MD  etodolac (LODINE) 400 MG tablet TAKE ONE TABLET BY MOUTH EVERY 12 HOURS 08/23/10   Michele Mcalpine, MD  ferrous sulfate 325 (65 FE) MG tablet Take 325 mg by mouth daily with breakfast.     Historical Provider, MD  fluticasone (FLONASE) 50 MCG/ACT nasal spray 2 SPRAYS IN EACH NOSTRIL ONCE A DAY 05/05/14   Jeoffrey Massed, MD  furosemide (LASIX) 40 MG tablet Take 1 tablet (40 mg total) by mouth daily. 05/17/14   Jeoffrey Massed, MD  gabapentin (NEURONTIN) 300 MG capsule TAKE ONE CAPSULE BY MOUTH THREE TIMES DAILY 12/21/13   Jeoffrey Massed, MD  glucose blood (ONE TOUCH ULTRA TEST) test strip TEST BLOOD SUGAR LEVELS THREE TIMES DAILY 01/08/14   Jeoffrey Massed, MD  HYDROcodone-acetaminophen (NORCO/VICODIN) 5-325 MG per tablet Take 1 tablet by mouth every 6 (six) hours as needed for moderate pain. 08/29/13   Gerhard Munch, MD  ibandronate (BONIVA) 150 MG tablet TAKE 1 TABLET EVERY MONTH ON THE SAME DAY 06/04/14   Jeoffrey Massed, MD  insulin aspart protamine - aspart (NOVOLOG MIX 70/30 FLEXPEN) (70-30) 100 UNIT/ML FlexPen 34 U SQ bid 06/10/14   Jeoffrey Massed, MD  ipratropium (ATROVENT) 0.02 % nebulizer solution Take 2.5 mLs (500 mcg total) by nebulization 4 (four) times daily. 06/07/11   Michele Mcalpine, MD  isosorbide mononitrate (IMDUR) 30 MG 24 hr tablet TAKE 1 TABLET DAILY 06/18/14   Jeoffrey Massed, MD  losartan (COZAAR) 50 MG tablet TAKE ONE TABLET BY MOUTH ONCE DAILY 06/04/14   Jeoffrey Massed, MD  nitroGLYCERIN (NITROSTAT) 0.4 MG SL tablet Place 1 tablet (0.4 mg total) under the tongue every 5 (five) minutes as needed. May repeat x3 07/23/12   Dyann Kief, PA-C  omeprazole (PRILOSEC) 20 MG capsule TAKE ONE CAPSULE BY MOUTH ONCE DAILY 30 MINUTES BEFORE A MEAL 03/09/14   Jeoffrey Massed, MD  ONE [facility] LANCETS MISC Check blood sugars two times daily 10/21/12   Michele Mcalpine, MD  pioglitazone (ACTOS) 15 MG tablet TAKE 1 TABLET DAILY  04/06/14   Jeoffrey Massed, MD  predniSONE (DELTASONE) 20 MG tablet 3 tabs po qd x 4d, then 2 tabs po qd x 4d, then 1 tab po qd x 4d 05/17/14   Jeoffrey Massed, MD  Probiotic Product (PROBIOTIC DAILY PO) Take 1 tablet by mouth daily.     Historical Provider, MD  simvastatin (ZOCOR) 80 MG tablet TAKE ONE TABLET AT BEDTIME 05/05/14   Jeoffrey Massed, MD  traMADol (ULTRAM) 50 MG tablet 1-2 tabs po q8h prn pain 05/05/14   Jeoffrey Massed, MD  TRUEPLUS INSULIN SYRINGE 30G X 5/16" 0.5 ML MISC USE 3 TIMES A DAY AS DIRECTED 03/24/14   Jeoffrey Massed, MD  vitamin B-12 (CYANOCOBALAMIN) 500 MCG tablet Take 500 mcg by mouth daily.     Historical Provider, MD  vitamin C (ASCORBIC ACID) 500 MG tablet Take 500 mg by mouth daily.    Historical Provider, MD   Allergies  Allergen Reactions  . Codeine Nausea And Vomiting    vomiting  . Terbutaline Sulfate     REACTION: INTOL to terbutaline  FAMILY HISTORY:  has no family status information on file.  SOCIAL HISTORY:  reports that she quit smoking about 15 years ago. Her smoking use included Cigarettes. She has a 30 pack-year smoking history. She has never used smokeless tobacco. She reports that she does not drink alcohol or use illicit drugs.  REVIEW OF SYSTEMS:  Difficult to obtain, pt on bipap.  As per HPI    SUBJECTIVE:   VITAL SIGNS: Temp:  [98 F (36.7 C)-99.6 F (37.6 C)] 98 F (36.7 C) (04/21 1000) Pulse Rate:  [75-128] 116 (04/21 1000) Resp:  [20-38] 25 (04/21 1000) BP: (114-192)/(52-116) 125/55 mmHg (04/21 1000) SpO2:  [82 %-98 %] 96 % (04/21 1000) FiO2 (%):  [40 %] 40 % (04/21 0900) Weight:  [230 lb (104.327 kg)] 230 lb (104.327 kg) (04/20 1715) HEMODYNAMICS:   VENTILATOR SETTINGS: Vent Mode:  [-]  FiO2 (%):  [40 %] 40 % INTAKE / OUTPUT:  Intake/Output Summary (Last 24 hours) at 06/24/14 1022 Last data filed at 06/23/14 2227  Gross per 24 hour  Intake      0 ml  Output    725 ml  Net   -725 ml    PHYSICAL  EXAMINATION: General:  Obese, chronically ill appearing white female Neuro:  Awake, alert, MAE HEENT:  bipap mask, poor dentition, no JVD  Cardiovascular:  s1s2 distant, irregular  Lungs:  resps even, improved on bipap, crackles bilat  Abdomen:  Obese, soft, non tender  Musculoskeletal:  Warm and dry, trace BLE edema   LABS:  CBC  Recent Labs Lab 06/23/14 2044 06/24/14 0401  WBC 17.6* 15.1*  HGB 13.4 13.2  HCT 41.9 40.4  PLT 234 186   Coag's No results for input(s): APTT, INR in the last 168 hours. BMET  Recent Labs Lab 06/23/14 2044  NA 133*  K 4.2  CL 91*  CO2 32  BUN 47*  CREATININE 1.95*  GLUCOSE 380*   Electrolytes  Recent Labs Lab 06/23/14 2044  CALCIUM 8.9   Sepsis Markers No results for input(s): LATICACIDVEN, PROCALCITON, O2SATVEN in the last 168 hours. ABG  Recent Labs Lab 06/23/14 1930 06/24/14 0855  PHART 7.367 7.374  PCO2ART 54.5* 45.3*  PO2ART 97.0 113.0*   Liver Enzymes No results for input(s): AST, ALT, ALKPHOS, BILITOT, ALBUMIN in the last 168 hours. Cardiac Enzymes  Recent Labs Lab 06/23/14 2044 06/24/14 0401  TROPONINI 9.89* 4.96*   Glucose  Recent Labs Lab 06/23/14 2203 06/24/14 0740  GLUCAP 377* 454*    Imaging Dg Chest 2 View  06/23/2014   CLINICAL DATA:  74 year old female with a history of shortness of breath for 3 days  EXAM: CHEST - 2 VIEW  COMPARISON:  Plain film 08/29/2013, 06/22/2014  FINDINGS: Cardiomediastinal silhouette unchanged in size and contour. Coronary stents in place.  Similar appearance of mild interstitial and airspace opacities.  No pleural effusion or pneumothorax.  No displaced fracture.  Unremarkable appearance of the upper abdomen.  IMPRESSION: Mixed interstitial and airspace opacities are nonspecific, potentially representing edema, atelectasis, and/ or atypical infection/consolidation.  Coronary artery disease.  Signed,  Yvone NeuJaime S. Loreta AveWagner, DO  [facility]   [facility]   Electronically Signed   By: Gilmer MorJaime  Wagner D.O.   On: 06/23/2014 22:03     ASSESSMENT / PLAN:  PULMONARY Acute on chronic respiratory failure - multifactorial in setting acute on chronic sCHF, NSTEMI, OSA and underlying severe COPD.  OSA AECOPD  P:   Cont bipap  qhs  CPAP once no longer needing continuous bipap F/u abg  F/u CXR  Diuresis per cards as able  BP control  Solumedrol  BD's - duonebs, PRN albuterol  Anxiolytic PRN  IV levaquin    CARDIOVASCULAR HTN  Acute on chronic HFrEF -- EF 35% 2008  NSTEMI - peak troponin 20.8 CAD  Ischemic cardiomyopathy P:  Cardiology following  Stat echo pending  Trend troponin  Diuresis as able  Cont nitro gtt  Possible cath when more stable  Cont asa, imdur  Heparin gtt    RENAL AKI  Hyponatremia  P:   F/u chem  Monitor renal function with aggressive diuresis   GASTROINTESTINAL No active issue  P:   PPI  NPO for now   HEMATOLOGIC No active issue  P:  Heparin gtt  F/u cbc   INFECTIOUS Leukocytosis - mild.   AECOPD  P:   Levaquin 4/20>>>  ENDOCRINE DM - poorly controlled  P:   Cont lantus, SSI  May need increase lantus - hold for now while NPO on bipap   NEUROLOGIC Anxiety  P:   Cont PRN xanax  Avoid oversedation    FAMILY  - Updates:  No family available 4/21    Dirk Dress, NP 06/24/2014  10:22 AM Pager: [phone] or [phone]

## 2014-06-24 NOTE — Progress Notes (Signed)
ANTICOAGULATION CONSULT NOTE -Follow up Pharmacy Consult for heparin Indication: chest pain/ACS  Allergies  Allergen Reactions  . Codeine Nausea And Vomiting    vomiting  . Terbutaline Sulfate     REACTION: INTOL to terbutaline    Patient Measurements: Height:  (162.6 cm) Weight: 230 lb (104.327 kg) IBW/kg (Calculated) : 54.7 Heparin Dosing Weight: 79 kg  Vital Signs: Temp: 98.4 F (36.9 C) (04/20 2358) Temp Source: Oral (04/20 2358) BP: 139/105 mmHg (04/21 0352) Pulse Rate: 101 (04/21 0352)  Labs:  Recent Labs  06/23/14 2044 06/24/14 0401  HGB 13.4 13.2  HCT 41.9 40.4  PLT 234 186  HEPARINUNFRC  --  1.90*  CREATININE 1.95*  --   TROPONINI 9.89* 4.96*    Estimated Creatinine Clearance: 30.2 mL/min (by C-G formula based on Cr of 1.95).   Medical History: Past Medical History  Diagnosis Date  . COPD (chronic obstructive pulmonary disease)     Oxygen prn ambulation  . Hyperlipidemia   . Essential hypertension, benign   . Anemia   . Anxiety   . Coronary atherosclerosis of native coronary artery     a. history of MI with PTCA/stent to LAD 07/1998. b. Repeat PTCA to this lesion in 02/1999. c. PTCA/brachytherapy to mLAD in 2001  . Osteoporosis   . Venous insufficiency   . Obesity   . Asthma   . Herpes zoster 06/2013  . Tobacco abuse   . Chronic respiratory failure   . GERD (gastroesophageal reflux disease)   . On home oxygen therapy     "2L all the time" (06/23/2014)  . Myocardial infarction 2000    2000  . Anginal pain     "light ones q now and then" (06/23/2014)  . Chronic bronchitis     "q fall and q spring" (06/23/2014)  . OSA (obstructive sleep apnea)     "does not wear CPAP"/daughters at bedside on 06/23/2014  . Type 2 diabetes mellitus   . History of blood transfusion X 1    "related to bleeding ulcers"  . History of bleeding ulcers   . Arthritis     "loaded w/it" (06/23/2014)  . DDD (degenerative disc disease), lumbar   . Osteoarthritis      Medications:  Prescriptions prior to admission  Medication Sig Dispense Refill Last Dose  . albuterol (PROVENTIL HFA;VENTOLIN HFA) 108 (90 BASE) MCG/ACT inhaler Inhale 2 puffs into the lungs every 6 (six) hours as needed for wheezing or shortness of breath. 1 Inhaler 6 Taking  . albuterol (PROVENTIL) (2.5 MG/3ML) 0.083% nebulizer solution Take 3 mLs (2.5 mg total) by nebulization 4 (four) times daily. 75 mL 11 Taking  . ALPRAZolam (XANAX) 0.5 MG tablet TAKE 1/2 TO 1 TABLET THREE TIMES DAILY AS NEEDED 90 tablet 5 Taking  . aspirin 81 MG tablet Take 81 mg by mouth daily.   Taking  . atenolol (TENORMIN) 50 MG tablet TAKE 1 TABLET ONCE A DAY 30 tablet 3 Taking  . azithromycin (ZITHROMAX) 250 MG tablet 2 tabs po qd x 1d, then 1 tab po qd x 4d 6 tablet 0 Taking  . budesonide (PULMICORT) 0.25 MG/2ML nebulizer solution Take 2 mLs (0.25 mg total) by nebulization 2 (two) times daily. 60 mL 11 Taking  . calcium-vitamin D (OSCAL WITH D) 500-200 MG-UNIT per tablet Take 1 tablet by mouth 2 (two) times daily.     Taking  . Cholecalciferol (VITAMIN D3) 1000 UNITS CAPS Take 1 capsule by mouth daily.    Taking  .  dextromethorphan-guaiFENesin (MUCINEX DM) 30-600 MG per 12 hr tablet Take 2 tablets by mouth every 12 (twelve) hours.     Taking  . etodolac (LODINE) 400 MG tablet TAKE ONE TABLET BY MOUTH EVERY 12 HOURS 60 tablet 3 Taking  . ferrous sulfate 325 (65 FE) MG tablet Take 325 mg by mouth daily with breakfast.    Taking  . fluticasone (FLONASE) 50 MCG/ACT nasal spray 2 SPRAYS IN EACH NOSTRIL ONCE A DAY 16 g 3 Taking  . furosemide (LASIX) 40 MG tablet Take 1 tablet (40 mg total) by mouth daily. 30 tablet 6 Taking  . gabapentin (NEURONTIN) 300 MG capsule TAKE ONE CAPSULE BY MOUTH THREE TIMES DAILY 90 capsule 11 Taking  . glucose blood (ONE TOUCH ULTRA TEST) test strip TEST BLOOD SUGAR LEVELS THREE TIMES DAILY 100 each 5 Taking  . HYDROcodone-acetaminophen (NORCO/VICODIN) 5-325 MG per tablet Take 1 tablet by  mouth every 6 (six) hours as needed for moderate pain. 15 tablet 0 Taking  . ibandronate (BONIVA) 150 MG tablet TAKE 1 TABLET EVERY MONTH ON THE SAME DAY 1 tablet 1   . insulin aspart protamine - aspart (NOVOLOG MIX 70/30 FLEXPEN) (70-30) 100 UNIT/ML FlexPen 34 U SQ bid 15 mL 11   . ipratropium (ATROVENT) 0.02 % nebulizer solution Take 2.5 mLs (500 mcg total) by nebulization 4 (four) times daily. 75 mL 11 Taking  . isosorbide mononitrate (IMDUR) 30 MG 24 hr tablet TAKE 1 TABLET DAILY 90 tablet 1   . losartan (COZAAR) 50 MG tablet TAKE ONE TABLET BY MOUTH ONCE DAILY 30 tablet 3   . nitroGLYCERIN (NITROSTAT) 0.4 MG SL tablet Place 1 tablet (0.4 mg total) under the tongue every 5 (five) minutes as needed. May repeat x3 25 tablet 6 Taking  . omeprazole (PRILOSEC) 20 MG capsule TAKE ONE CAPSULE BY MOUTH ONCE DAILY 30 MINUTES BEFORE A MEAL 30 capsule 3 Taking  . ONE TOUCH LANCETS MISC Check blood sugars two times daily 100 each 6 Taking  . pioglitazone (ACTOS) 15 MG tablet TAKE 1 TABLET DAILY 30 tablet 3 Taking  . predniSONE (DELTASONE) 20 MG tablet 3 tabs po qd x 4d, then 2 tabs po qd x 4d, then 1 tab po qd x 4d 24 tablet 0 Taking  . Probiotic Product (PROBIOTIC DAILY PO) Take 1 tablet by mouth daily.    Taking  . simvastatin (ZOCOR) 80 MG tablet TAKE ONE TABLET AT BEDTIME 30 tablet 3 Taking  . traMADol (ULTRAM) 50 MG tablet 1-2 tabs po q8h prn pain 180 tablet 2 Taking  . TRUEPLUS INSULIN SYRINGE 30G X 5/16" 0.5 ML MISC USE 3 TIMES A DAY AS DIRECTED 100 each 6 Taking  . vitamin B-12 (CYANOCOBALAMIN) 500 MCG tablet Take 500 mcg by mouth daily.    Taking  . vitamin C (ASCORBIC ACID) 500 MG tablet Take 500 mg by mouth daily.   Taking    Assessment: Initial 8 hour heparin level is 1.9 on 1150 units/hr and about 8 hours after 4000 units bolus.  SUPRAtherapeutic heparin level in this 74 y.o female on heparin for NSEMI.  RN reports that the heparin is infusing in the left hand and the heparin level appears  to have been drawn from RUE, thus level should be accurate. No bleeding noted.  . Goal of Therapy:  Heparin level 0.3-0.7 units/ml Monitor platelets by anticoagulation protocol: Yes   Plan:  Hold Heparin drip x 1.25 hour then resume heparin at lower rate of 900 units/hr. Check heparin  level in 6 hours and daily while on heparin. Daily CBC while on heparin.  Noah Delaine, RPh Clinical Pharmacist Pager: [phone] 06/24/2014,6:38 AM

## 2014-06-24 NOTE — Progress Notes (Signed)
On assessment pt was sitting on side of bed with SOB and bilateral expiratory wheezes. Pt stated that this was unchanged from the previous day. Pt repositioned in bed for CXR and pt became increasingly SOB with RR in 30's, sats 93% on 5L [place]. Pt then stating that, "she could not go on like this". Thedore MinsSingh, MD called to notify of change in pt status. Respiratory and RR called as well at this time. On arrival pt hypertensive 190's/110's, HR 160's, RR 30's. Pt received scheduled breathing treatment and was placed on BiPAP per Thedore MinsSingh, MD. Pt received one SL NTG, 60 IV lasix, and started on NTG gtt. Pt WOB then decreasing in response to treatments. [facility] RN on 2H. Pt transferred with Respiratory and Rapid. Family notified of all changes.   [name], RCharity fundraiser

## 2014-06-24 NOTE — Progress Notes (Signed)
ANTICOAGULATION CONSULT NOTE -Follow up Pharmacy Consult for heparin Indication: chest pain/ACS  Allergies  Allergen Reactions  . Codeine Nausea And Vomiting    vomiting  . Terbutaline Sulfate     REACTION: INTOL to terbutaline    Patient Measurements: Height: 5\' 4"  (162.6 cm) Weight: 230 lb (104.327 kg) IBW/kg (Calculated) : 54.7 Heparin Dosing Weight: 79 kg  Vital Signs: Temp: 97.9 F (36.6 C) (04/21 1530) Temp Source: Axillary (04/21 1530) BP: 145/83 mmHg (04/21 1930) Pulse Rate: 121 (04/21 1930)  Labs:  Recent Labs  06/23/14 2044 06/24/14 0401 06/24/14 0935 06/24/14 0938 06/24/14 1440 06/24/14 1951  HGB 13.4 13.2 14.0  --   --   --   HCT 41.9 40.4 43.2  --   --   --   PLT 234 186 213  --   --   --   HEPARINUNFRC  --  1.90*  --  0.88*  --  0.66  CREATININE 1.95*  --  1.65*  --   --   --   TROPONINI 9.89* 4.96* 3.46*  --  5.26*  --     Estimated Creatinine Clearance: 35.7 mL/min (by C-G formula based on Cr of 1.65).   Assessment: Initial 8 hour heparin level is 1.9 on 1150 units/hr and about 8 hours after 4000 units bolus.  SUPRAtherapeutic heparin level in this 74 y.o female on heparin for NSEMI.  RN reports that the heparin is infusing in the left hand and the heparin level appears to have been drawn from RUE, thus level should be accurate. No bleeding noted.   Heparin has been on hold since 6:30 am with bleeding at the IV site which has now resolved, stat heparin level still elevated at 0.88  Follow-up HL is therapeutic at 0.66 on heparin 750 units/hr. No issues with infusion or bleeding noted. . Goal of Therapy:  Heparin level 0.3-0.7 units/ml Monitor platelets by anticoagulation protocol: Yes   Plan:  Continue heparin 750 units/hr Daily HL/CBC Monitor s/sx of bleeding  Arlean Hoppingorey M. Newman PiesBall, PharmD Clinical Pharmacist Pager [phone]  06/24/2014,8:25 PM

## 2014-06-24 NOTE — Progress Notes (Signed)
Called by E-link, patient decompensating from a respiratory standpoint.  Chart reviewed and patient examine.  In respiratory failure on BiPAP.  Patient remained alert and oriented.  Daughters and grandson bedside.  Spoke with patient regarding her wishes.  She said short term intubation only.  We also discussed code status, wishes for no CPR/cardioversion/trach/peg.  Daughters witnessed to patient's wishes.  Will intubate and place on full vent support.  Will change code status as above.  The patient is critically ill with multiple organ systems failure and requires high complexity decision making for assessment and support, frequent evaluation and titration of therapies, application of advanced monitoring technologies and extensive interpretation of multiple databases.   Critical Care Time devoted to patient care services described in this note is  45  Minutes. This time reflects time of care of this signee Dr Koren BoundWesam Yacoub. This critical care time does not reflect procedure time, or teaching time or supervisory time of PA/NP/Med student/Med Resident etc but could involve care discussion time.  Alyson ReedyWesam G. Yacoub, M.D. [facility]. Pager: [phone]. After hours pager: [phone].

## 2014-06-24 NOTE — Care Management Note (Addendum)
    Page 1 of 1   06/29/2014     4:33:05 PM CARE MANAGEMENT NOTE 06/29/2014  Patient:  [name],[name]   Account Number:  [id number]  Date Initiated:  06/24/2014  Documentation initiated by:  Junius CreamerWELL,DEBBIE  Subjective/Objective Assessment:   adm w inc troponins, on bipap     Action/Plan:   lives in indep apt. chart states has home o2, pcp dr Horatio Pelphilip mcgowan   Anticipated DC Date:  06/29/2014   Anticipated DC Plan:  SKILLED NURSING FACILITY  In-house referral  Clinical Social Worker      DC Planning Services  CM consult      Choice offered to / List presented to:  C-4 Adult Children           Status of service:  Completed, signed off Medicare Important Message given?  YES (If response is "NO", the following Medicare IM given date fields will be blank) Date Medicare IM given:  06/28/2014 Medicare IM given by:  [facility]AL[facility] Date Additional Medicare IM given:   Additional Medicare IM given by:    Discharge Disposition:  SKILLED NURSING FACILITY  Per UR Regulation:  Reviewed for med. necessity/level of care/duration of stay  If discussed at Long Length of Stay Meetings, dates discussed:   06/29/2014    Comments:  06/29/14 Sidney AceJulie Yanelle Sousa, RN, BSN [phone] Pt now agreeable for SNF, and bed available.  Plan for dc to SNF today, per CSW arrangements.  06/28/2014 1600 NCM spoke to pt and gave permission to speak to dtr, [name] [phone]. Dtr states between her other siblings and pt's adult grandchildren they will arrange 24 hour care for pt at home. She currently lives alone and wants to go home. Refusing SNF-rehab. She has RW, 3n1 and shower seat at home. Will need HHPT and RN at dc. Provided HHA list and offered choice. Dtr states she will look the list over and provide choice to NCM on tomorrow. Dtr will bring her portable oxygen tank at dc. Pt has oxygen with [employer]. [facility]. Isidoro DonningAlesia Shavis RN CCM Case Mgmt phone [phone]  06/27/14 08:50 CM placed [facility] on Pt's Discharge instructions to secure a new PCP.  No other CM needs were communicated.  Freddy JakschSarah Jeffries, BSN, CM [phone].

## 2014-06-24 NOTE — Progress Notes (Signed)
eLink Physician-Brief Progress Note Patient Name: [name] DOB: 07/09/1940 MRN: [id number]   Date of Service  06/24/2014  HPI/Events of Note  ABG on 100%/PRVC 16/TV NA/P 5 = 7.25/80/300  eICU Interventions  Will increase rate to 19 and recheck ABG at 8:30 PM.     Intervention Category Major Interventions: Respiratory failure - evaluation and management  Lenell AntuSommer,Smiley Birr Eugene 06/24/2014, 7:26 PM

## 2014-06-25 ENCOUNTER — Encounter (HOSPITAL_COMMUNITY): Payer: Self-pay | Admitting: Interventional Cardiology

## 2014-06-25 ENCOUNTER — Inpatient Hospital Stay (HOSPITAL_COMMUNITY): Payer: PPO

## 2014-06-25 ENCOUNTER — Encounter (HOSPITAL_COMMUNITY)
Admission: AD | Disposition: A | Discharge status: Skilled Nursing Facility | Payer: Self-pay | Source: Other Acute Inpatient Hospital | Attending: Internal Medicine

## 2014-06-25 DIAGNOSIS — I2510 Atherosclerotic heart disease of native coronary artery without angina pectoris: Secondary | ICD-10-CM

## 2014-06-25 DIAGNOSIS — I213 ST elevation (STEMI) myocardial infarction of unspecified site: Secondary | ICD-10-CM

## 2014-06-25 HISTORY — PX: LEFT HEART CATHETERIZATION WITH CORONARY ANGIOGRAM: SHX5451

## 2014-06-25 LAB — CBC
HEMATOCRIT: 40.2 % (ref 36.0–46.0)
Hemoglobin: 12.7 g/dL (ref 12.0–15.0)
MCH: 30 pg (ref 26.0–34.0)
MCHC: 31.6 g/dL (ref 30.0–36.0)
MCV: 94.8 fL (ref 78.0–100.0)
Platelets: 206 10*3/uL (ref 150–400)
RBC: 4.24 MIL/uL (ref 3.87–5.11)
RDW: 12.5 % (ref 11.5–15.5)
WBC: 14 10*3/uL — AB (ref 4.0–10.5)

## 2014-06-25 LAB — BASIC METABOLIC PANEL
ANION GAP: 13 (ref 5–15)
BUN: 52 mg/dL — ABNORMAL HIGH (ref 6–23)
CO2: 31 mmol/L (ref 19–32)
Calcium: 8.8 mg/dL (ref 8.4–10.5)
Chloride: 98 mmol/L (ref 96–112)
Creatinine, Ser: 1.48 mg/dL — ABNORMAL HIGH (ref 0.50–1.10)
GFR calc Af Amer: 39 mL/min — ABNORMAL LOW (ref >90)
GFR, EST NON AFRICAN AMERICAN: 34 mL/min — AB (ref >90)
GLUCOSE: 372 mg/dL — AB (ref 70–99)
POTASSIUM: 4.1 mmol/L (ref 3.5–5.1)
Sodium: 142 mmol/L (ref 135–145)

## 2014-06-25 LAB — GLUCOSE, CAPILLARY
GLUCOSE-CAPILLARY: 349 mg/dL — AB (ref 70–99)
GLUCOSE-CAPILLARY: 162 mg/dL — AB (ref 70–99)
Glucose-Capillary: 399 mg/dL — ABNORMAL HIGH (ref 70–99)
Glucose-Capillary: 161 mg/dL — ABNORMAL HIGH (ref 70–99)

## 2014-06-25 LAB — URINALYSIS, ROUTINE W REFLEX MICROSCOPIC
Glucose, UA: NEGATIVE mg/dL
KETONES UR: 15 mg/dL — AB
NITRITE: NEGATIVE
Protein, ur: 30 mg/dL — AB
Specific Gravity, Urine: 1.026 (ref 1.005–1.030)
Urobilinogen, UA: 0.2 mg/dL (ref 0.0–1.0)
pH: 5.5 (ref 5.0–8.0)

## 2014-06-25 LAB — POCT I-STAT 3, ART BLOOD GAS (G3+)
ACID-BASE DEFICIT: 9 mmol/L — AB (ref 0.0–2.0)
Acid-Base Excess: 5 mmol/L — ABNORMAL HIGH (ref 0.0–2.0)
BICARBONATE: 31.9 meq/L — AB (ref 20.0–24.0)
BICARBONATE: 16.7 meq/L — AB (ref 20.0–24.0)
O2 SAT: 98 %
O2 Saturation: 95 %
PCO2 ART: 51.5 mmHg — AB (ref 35.0–45.0)
PH ART: 7.397 (ref 7.350–7.450)
PO2 ART: 109 mmHg — AB (ref 80.0–100.0)
Patient temperature: 97.5
TCO2: 33 mmol/L (ref 0–100)
TCO2: 18 mmol/L (ref 0–100)
pCO2 arterial: 34.1 mmHg — ABNORMAL LOW (ref 35.0–45.0)
pH, Arterial: 7.299 — ABNORMAL LOW (ref 7.350–7.450)
pO2, Arterial: 84 mmHg (ref 80.0–100.0)

## 2014-06-25 LAB — APTT: aPTT: 25 seconds (ref 24–37)

## 2014-06-25 LAB — URINE MICROSCOPIC-ADD ON

## 2014-06-25 LAB — HEPARIN LEVEL (UNFRACTIONATED): Heparin Unfractionated: 0.37 IU/mL (ref 0.30–0.70)

## 2014-06-25 LAB — PROTIME-INR
INR: 0.94 (ref 0.00–1.49)
INR: 0.96 (ref 0.00–1.49)
PROTHROMBIN TIME: 12.7 s (ref 11.6–15.2)
PROTHROMBIN TIME: 12.8 s (ref 11.6–15.2)

## 2014-06-25 LAB — BRAIN NATRIURETIC PEPTIDE: B NATRIURETIC PEPTIDE 5: 321.3 pg/mL — AB (ref 0.0–100.0)

## 2014-06-25 LAB — MAGNESIUM: Magnesium: 2.3 mg/dL (ref 1.5–2.5)

## 2014-06-25 LAB — PROCALCITONIN: PROCALCITONIN: 1.08 ng/mL

## 2014-06-25 LAB — PHOSPHORUS: PHOSPHORUS: 2.8 mg/dL (ref 2.3–4.6)

## 2014-06-25 SURGERY — Surgical Case
Anesthesia: *Unknown

## 2014-06-25 SURGERY — LEFT HEART CATHETERIZATION WITH CORONARY ANGIOGRAM
Anesthesia: LOCAL

## 2014-06-25 MED ORDER — NITROGLYCERIN 1 MG/10 ML FOR IR/CATH LAB
INTRA_ARTERIAL | Status: AC
  Filled 2014-06-25: qty 10

## 2014-06-25 MED ORDER — PERFLUTREN LIPID MICROSPHERE
1.0000 mL | INTRAVENOUS | Status: AC | PRN
  Filled 2014-06-25: qty 10

## 2014-06-25 MED ORDER — HEPARIN (PORCINE) IN NACL 2-0.9 UNIT/ML-% IJ SOLN
INTRAMUSCULAR | Status: AC
  Filled 2014-06-25: qty 1000

## 2014-06-25 MED ORDER — VERAPAMIL HCL 2.5 MG/ML IV SOLN
INTRAVENOUS | Status: AC
  Filled 2014-06-25: qty 2

## 2014-06-25 MED ORDER — HEPARIN (PORCINE) IN NACL 2-0.9 UNIT/ML-% IJ SOLN
INTRAMUSCULAR | Status: AC
  Filled 2014-06-25: qty 500

## 2014-06-25 MED ORDER — PERFLUTREN LIPID MICROSPHERE
INTRAVENOUS | Status: AC
  Administered 2014-06-25: 2 mL
  Filled 2014-06-25: qty 10

## 2014-06-25 MED ORDER — ASPIRIN EC 81 MG PO TBEC
81.0000 mg | DELAYED_RELEASE_TABLET | Freq: Every day | ORAL | Status: DC
  Administered 2014-06-27 – 2014-06-29 (×3): 81 mg via ORAL
  Filled 2014-06-25 (×3): qty 1

## 2014-06-25 MED ORDER — SODIUM CHLORIDE 0.9 % IJ SOLN
3.0000 mL | Freq: Two times a day (BID) | INTRAMUSCULAR | Status: DC
  Administered 2014-06-25 – 2014-06-26 (×2): 3 mL via INTRAVENOUS

## 2014-06-25 MED ORDER — SODIUM CHLORIDE 0.9 % IV SOLN
INTRAVENOUS | Status: AC
  Administered 2014-06-25: 17:00:00 via INTRAVENOUS

## 2014-06-25 MED ORDER — HEPARIN SODIUM (PORCINE) 5000 UNIT/ML IJ SOLN
5000.0000 [IU] | Freq: Three times a day (TID) | INTRAMUSCULAR | Status: DC
  Administered 2014-06-25 – 2014-06-27 (×5): 5000 [IU] via SUBCUTANEOUS
  Filled 2014-06-25 (×8): qty 1

## 2014-06-25 MED ORDER — FENTANYL CITRATE (PF) 100 MCG/2ML IJ SOLN
INTRAMUSCULAR | Status: AC
  Filled 2014-06-25: qty 2

## 2014-06-25 MED ORDER — INSULIN ASPART 100 UNIT/ML ~~LOC~~ SOLN
0.0000 [IU] | SUBCUTANEOUS | Status: DC
Start: 2014-06-25 — End: 2014-06-27
  Administered 2014-06-25: 15 [IU] via SUBCUTANEOUS
  Administered 2014-06-25 (×3): 4 [IU] via SUBCUTANEOUS
  Administered 2014-06-26: 7 [IU] via SUBCUTANEOUS
  Administered 2014-06-26 (×2): 4 [IU] via SUBCUTANEOUS
  Administered 2014-06-26: 11 [IU] via SUBCUTANEOUS
  Administered 2014-06-26 (×2): 3 [IU] via SUBCUTANEOUS
  Administered 2014-06-27: 4 [IU] via SUBCUTANEOUS
  Administered 2014-06-27: 7 [IU] via SUBCUTANEOUS

## 2014-06-25 MED ORDER — SODIUM CHLORIDE 0.9 % IV SOLN: INTRAVENOUS | Status: DC

## 2014-06-25 MED ORDER — HEPARIN SODIUM (PORCINE) 1000 UNIT/ML IJ SOLN
INTRAMUSCULAR | Status: AC
  Filled 2014-06-25: qty 1

## 2014-06-25 MED ORDER — METOPROLOL TARTRATE 25 MG PO TABS
25.0000 mg | ORAL_TABLET | Freq: Two times a day (BID) | ORAL | Status: DC
  Administered 2014-06-25: 25 mg via ORAL
  Filled 2014-06-25 (×3): qty 1

## 2014-06-25 MED ORDER — ASPIRIN 81 MG PO CHEW
81.0000 mg | CHEWABLE_TABLET | ORAL | Status: AC
  Administered 2014-06-25: 81 mg via ORAL
  Filled 2014-06-25: qty 1

## 2014-06-25 MED ORDER — SODIUM CHLORIDE 0.9 % IJ SOLN
3.0000 mL | INTRAMUSCULAR | Status: DC | PRN
Start: 2014-06-25 — End: 2014-06-26

## 2014-06-25 MED ORDER — LIDOCAINE HCL (PF) 1 % IJ SOLN
INTRAMUSCULAR | Status: AC
  Filled 2014-06-25: qty 30

## 2014-06-25 MED ORDER — ACETAMINOPHEN 325 MG PO TABS: 650.0000 mg | ORAL_TABLET | ORAL | Status: DC | PRN

## 2014-06-25 MED ORDER — SODIUM CHLORIDE 0.9 % IV SOLN: 250.0000 mL | INTRAVENOUS | Status: DC | PRN

## 2014-06-25 MED ORDER — MIDAZOLAM HCL 2 MG/2ML IJ SOLN
INTRAMUSCULAR | Status: AC
  Filled 2014-06-25: qty 2

## 2014-06-25 MED ORDER — FUROSEMIDE 10 MG/ML IJ SOLN
60.0000 mg | Freq: Once | INTRAMUSCULAR | Status: AC
  Administered 2014-06-25: 60 mg via INTRAVENOUS
  Filled 2014-06-25: qty 6

## 2014-06-25 NOTE — CV Procedure (Signed)
    Cardiac Catheterization Procedure Note  Name: Harold Hedgera F Shepler MRN: [id number] DOB: 12/18/1940  Procedure: Left Heart Cath, Selective Coronary Angiography  Indication: NSTEMI. This 74 year old woman with acute respiratory failure. She has had non-ST elevation infarction by cardiac enzymes. She had acute respiratory failure with markedly elevated cardiac markers and is referred for cardiac catheterization to rule out severe obstructive CAD. The patient does have known coronary artery disease with previous stenting of the LAD.   Procedural Details: The right wrist was prepped, draped, and anesthetized with 1% lidocaine. Using the modified Seldinger technique, a 5/6 French Slender sheath was introduced into the right radial artery. 3 mg of verapamil was administered through the sheath, weight-based unfractionated heparin was administered intravenously. Standard Judkins catheters were used for selective coronary angiography. Catheter exchanges were performed over an exchange length guidewire. There were no immediate procedural complications. However, the patient did develop hypotension after administration of Versed and fentanyl. Nitroglycerin was stopped and she was bolus of normal saline. By the completion of the procedure, her systolic blood pressure was greater than 100 mmHg. A TR band was used for radial hemostasis at the completion of the procedure.  The patient was transferred to the post catheterization recovery area for further monitoring.  Procedural Findings: Hemodynamics: AO 85/52 with a mean of 63 LV 81/14  Coronary angiography: Coronary dominance: right  Left mainstem: The left main is patent with mild 20-30% distal left mainstem stenosis. The vessel divides into the LAD and left circumflex.  Left anterior descending (LAD): The LAD is patent. There is moderate proximal LAD stenosis just before the stented segment with associated calcification. I would estimate this at 60%. There is also  moderate in-stent restenosis of 50-60% in the proximal portion of the stented segment in the mid LAD. The remainder of the stent has mild diffuse 30% in-stent restenosis. The mid and distal LAD have diffuse disease with at most 75% stenosis. Diagonal branches are patent.  Left circumflex (LCx): The left circumflex is patent. There is a small intermediate branch. The obtuse marginal branches are patent without significant stenoses. There is mild irregularity throughout the circumflex distribution. The left circumflex is dominant.  Right coronary artery (RCA): Small, nondominant vessel. There is an 80% stenosis in the proximal RCA which is unchanged from the previous study. This vessel only supplies the RV marginal branches.  Left ventriculography: Deferred   Estimated Blood Loss: Minimal  Final Conclusions:   Stable 2 vessel coronary artery disease compared to previous cardiac catheterization from 2014. There is severe stenosis of a small nondominant right coronary artery which should be managed medically, and moderate stenosis of the LAD with no progression of disease compared to the previous study  Recommendations: Will discontinue IV heparin and transition her to DVT prophylaxis dose heparin. Otherwise recommend medical management of her coronary artery disease. 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  Tonny BollmanMichael Elivia Robotham MD, [facility] 06/25/2014, 4:05 PM

## 2014-06-25 NOTE — Progress Notes (Signed)
ANTICOAGULATION CONSULT NOTE -Follow up Pharmacy Consult for heparin Indication: chest pain/ACS  Allergies  Allergen Reactions  . Codeine Nausea And Vomiting    vomiting  . Terbutaline Sulfate     REACTION: INTOL to terbutaline    Patient Measurements: Height: 5\' 4"  (162.6 cm) Weight: 226 lb 3.1 oz (102.6 kg) IBW/kg (Calculated) : 54.7 Heparin Dosing Weight: 79 kg  Vital Signs: Temp: 97.5 F (36.4 C) (04/22 0330) Temp Source: Axillary (04/22 0330) BP: 151/67 mmHg (04/22 0630) Pulse Rate: 108 (04/22 0630)  Labs:  Recent Labs  06/23/14 2044  06/24/14 0401 06/24/14 0935 06/24/14 0938 06/24/14 1440 06/24/14 1951 06/25/14 0320  HGB 13.4  --  13.2 14.0  --   --   --  12.7  HCT 41.9  --  40.4 43.2  --   --   --  40.2  PLT 234  --  186 213  --   --   --  206  HEPARINUNFRC  --   < > 1.90*  --  0.88*  --  0.66 0.37  CREATININE 1.95*  --   --  1.65*  --   --   --  1.48*  TROPONINI 9.89*  --  4.96* 3.46*  --  5.26* 4.63*  --   < > = values in this interval not displayed.  Estimated Creatinine Clearance: 39.5 mL/min (by C-G formula based on Cr of 1.48).   Assessment: 74 y.o female continuing on heparin for NSTEMI. [facility] work[facility] and possible cath on hold for renal function. Heparin was off ~3h on 4/21 in the AM for bleeding at IV site. Restarted @750  units/h last night, and HL now therapeutic x2 (0.36). No issues with infusion or bleeding noted. . Goal of Therapy:  Heparin level 0.3-0.7 units/ml Monitor platelets by anticoagulation protocol: Yes   Plan:  Continue heparin 750 units/hr Daily HL/CBC Monitor s/sx of bleeding  Babs BertinHaley Balraj Brayfield, PharmD Clinical Pharmacist - Resident Pager [phone] 06/25/2014 7:03 AM

## 2014-06-25 NOTE — Progress Notes (Signed)
Inpatient Diabetes Program Recommendations  AACE/ADA: New Consensus Statement on Inpatient Glycemic Control (2013)  Target Ranges:  Prepandial:   less than 140 mg/dL      Peak postprandial:   less than 180 mg/dL (1-2 hours)      Critically ill patients:  140 - 180 mg/dL   Inpatient Diabetes Program Recommendations Correction (SSI): Increase to RESISTANT scale during steroid therapy Insulin - Meal Coverage: May also need to increase prandial dose  Thank you  Piedad ClimesGina Danarius Mcconathy BSN, RN,CDE Inpatient Diabetes Coordinator [phone] (team pager)

## 2014-06-25 NOTE — Progress Notes (Signed)
  Echocardiogram 2D Echocardiogram with Definity has been performed.  Cathie BeamsGREGORY, Jahmier Willadsen 06/25/2014, 9:44 AM

## 2014-06-25 NOTE — Progress Notes (Signed)
eLink Physician-Brief Progress Note Patient Name: [name] DOB: 03/08/1940 MRN: [id number]   Date of Service  06/25/2014  HPI/Events of Note  Urine maroon colored. Platelets = 206K earlier today. Patient was on a Heparin IV infusion earlier today.   eICU Interventions  Will order: 1. UA now. 2. PT - INR now.  3. PTT now.      Intervention Category Minor Interventions: Clinical assessment - ordering diagnostic tests  Lenell AntuSommer,[name] 06/25/2014, 8:58 PM

## 2014-06-25 NOTE — Progress Notes (Addendum)
SUBJECTIVE:  Awake and intubated  OBJECTIVE:   Vitals:   Filed Vitals:   06/25/14 0600 06/25/14 0630 06/25/14 0730 06/25/14 0756  BP: 162/63 151/67 149/74 149/74  Pulse: 107 108 115 108  Temp:   97.8 F (36.6 C)   TempSrc:   Oral   Resp: Height:      Weight:      SpO2: 96% 98% 98%    I&O's:   Intake/Output Summary (Last 24 hours) at 06/25/14 1610 Last data filed at 06/25/14 0600  Gross per 24 hour  Intake 626.15 ml  Output   2625 ml  Net -1998.85 ml   TELEMETRY: Reviewed telemetry pt in sinus tachcyardia:     PHYSICAL EXAM General: Well developed, well nourished, in no acute distress and intubated and awake Head: Eyes PERRLA, No xanthomas.   Normal cephalic and atramatic  Lungs:  Decreased BS throughout with no crackles Heart:   HRRR S1 S2 Pulses are 2+ & equal. Abdomen: Bowel sounds are positive, abdomen soft and non-tender without masses Extremities:   No clubbing, cyanosis or edema.  DP +1 Neuro: Alert and oriented X 3. Psych:  Good affect, responds appropriately   LABS: Basic Metabolic Panel:  Recent Labs  96/04/54 0935 06/25/14 0320  NA 138 142  K 4.6 4.1  CL 95* 98  CO2 28 31  GLUCOSE 453* 372*  BUN 48* 52*  CREATININE 1.65* 1.48*  CALCIUM 9.1 8.8  MG  --  2.3  PHOS  --  2.8   Liver Function Tests:  Recent Labs  06/24/14 0935  AST 39*  ALT 34  ALKPHOS 61  BILITOT 0.5  PROT 6.4  ALBUMIN 3.4*   No results for input(s): LIPASE, AMYLASE in the last 72 hours. CBC:  Recent Labs  06/23/14 2044  06/24/14 0935 06/25/14 0320  WBC 17.6*  < > 15.5* 14.0*  NEUTROABS 15.3*  --   --   --   HGB 13.4  < > 14.0 12.7  HCT 41.9  < > 43.2 40.2  MCV 93.5  < > 93.5 94.8  PLT 234  < > 213 206  < > = values in this interval not displayed. Cardiac Enzymes:  Recent Labs  06/24/14 0935 06/24/14 1440 06/24/14 1951  TROPONINI 3.46* 5.26* 4.63*   BNP: Invalid input(s): POCBNP D-Dimer: No results for input(s): DDIMER in the last 72  hours. Hemoglobin A1C: No results for input(s): HGBA1C in the last 72 hours. Fasting Lipid Panel: No results for input(s): CHOL, HDL, LDLCALC, TRIG, CHOLHDL, LDLDIRECT in the last 72 hours. Thyroid Function Tests: No results for input(s): TSH, T4TOTAL, T3FREE, THYROIDAB in the last 72 hours.  Invalid input(s): FREET3 Anemia Panel: No results for input(s): VITAMINB12, FOLATE, FERRITIN, TIBC, IRON, RETICCTPCT in the last 72 hours. Coag Panel:   Lab Results  Component Value Date   INR 0.90 07/04/2012   INR 0.99 05/19/2010   INR 0.9 08/29/2006    RADIOLOGY: Dg Chest 2 View  06/23/2014   CLINICAL DATA:  74 year old female with a history of shortness of breath for 3 days  EXAM: CHEST - 2 VIEW  COMPARISON:  Plain film 08/29/2013, 06/22/2014  FINDINGS: Cardiomediastinal silhouette unchanged in size and contour. Coronary stents in place.  Similar appearance of mild interstitial and airspace opacities.  No pleural effusion or pneumothorax.  No displaced fracture.  Unremarkable appearance of the upper abdomen.  IMPRESSION: Mixed interstitial and airspace opacities are nonspecific, potentially representing edema, atelectasis,  and/ or atypical infection/consolidation.  Coronary artery disease.  Signed,  Yvone Neu. Loreta Ave, DO  [facility]  [facility]   Electronically Signed   By: Gilmer Mor D.O.   On: 06/23/2014 22:03   Dg Chest Portable 1 View  06/25/2014   CLINICAL DATA:  Respiratory failure  EXAM: PORTABLE CHEST - 1 VIEW  COMPARISON:  06/24/2014  FINDINGS: Endotracheal tube is 7 cm above the carina. NG tube enters the stomach. Patchy lingular and right basilar airspace opacity, similar prior study. Increasing patchy right upper lobe opacity. No effusions. No acute bony abnormality.  IMPRESSION: Patchy bibasilar opacities are stable. Slight increased right upper lobe airspace opacity.  Endotracheal tube retracted, now 7 cm above the carina.   Electronically  Signed   By: Charlett Nose M.D.   On: 06/25/2014 04:42   Dg Chest Port 1 View  06/24/2014   CLINICAL DATA:  Endotracheal and enteric tube placement.  EXAM: PORTABLE CHEST - 1 VIEW  COMPARISON:  Earlier same day ; 06/23/2014 ; 08/29/2013  FINDINGS: Grossly unchanged borderline enlarged cardiac silhouette and mediastinal contours with atherosclerotic plaque within the thoracic aorta. Endotracheal tube overlies the tracheal air column with tip superior to the carina. Enteric tube tip terminates below the left hemidiaphragm. No pneumothorax. Grossly unchanged bibasilar heterogeneous opacities. No new focal airspace opacities. No pleural effusion or pneumothorax. No evidence of edema. No acute osseus abnormalities.  IMPRESSION: 1. Appropriately positioned support apparatus as above. No pneumothorax. 2. Similar findings of lung hyperexpansion and bibasilar opacities, left greater than right, likely atelectasis or scar.   Electronically Signed   By: Simonne Come M.D.   On: 06/24/2014 18:12   Dg Chest Port 1 View  06/24/2014   CLINICAL DATA:  Shortness of breath.  EXAM: PORTABLE CHEST - 1 VIEW  COMPARISON:  June 24, 2014.  FINDINGS: Stable cardiomediastinal silhouette. No pneumothorax or pleural effusion is noted. Stable linear density is noted in lingular region consistent with subsegmental atelectasis. Possible cavitary abnormality is noted in right midlung. No significant abnormality seen in the skeleton.  IMPRESSION: Stable linear density seen in lingular region consistent with subsegmental atelectasis. Possible cavitary abnormality seen right mid lung. CT scan of the chest is recommended for further evaluation.   Electronically Signed   By: Lupita Raider, M.D.   On: 06/24/2014 12:27   Dg Chest Port 1 View  06/24/2014   CLINICAL DATA:  Acute onset shortness of breath and weakness  EXAM: PORTABLE CHEST - 1 VIEW  COMPARISON:  06/23/2014  FINDINGS: Stable cardiomegaly with improving bibasilar atelectasis. No  current pneumonia, edema, collapse or consolidation. No effusion or pneumothorax. Trachea midline. Degenerative changes of the spine and shoulders.  IMPRESSION: cardiomegaly with improving bibasilar atelectasis.   Electronically Signed   By: Judie Petit.  Shick M.D.   On: 06/24/2014 08:40   ASSESSMENT AND PLAN:  Principal Problem:  Acute on chronic respiratory failure now intubated on 50% FiO2.  This appears to be mainly COPD exacerbation complicated by acute diastolic CHF although BNP only mildly elevated and chest xray shows no edema.  Await echo results.   Active Problems:  NSTEMI (non-ST elevated myocardial infarction) - Troponin at [facility] peak 20.81 and was down-trending but bumped slightly yesterday in setting of acute respiratory distress and is now trending down - Echo planned for this morning, follow up results - Last cath 07/10/12 demonstrated moderate diffuse proximal LAD stenosis with in-stent restenosis, nonobstructive stenosis of large dominant left Cx, moderately  severe stenosis of small nondominant RCA, moderate LV systolic dysfunction - Will plan left heart cath today.    Renal function improved (creatinine 1.48, BUN 52 today; last recorded prior to this admission creatinine 1.15 BUN 22 on 01/27/14).  Discussed with Dr. Marchelle Gearingamaswamy and he is ok with proceeding with cath today while patient intubated.  I have discussed this with the patient's daughter.  Cardiac catheterization was discussed with the patient's daughter fully including risks on myocardial infarction, death, stroke, bleeding, arrhythmia, dye allergy, renal insufficiency or bleeding.  All patient questions and concerns were discussed and the daughter understands and is willing to proceed.    - Continue ASA, Imdur, statin.  Change Coreg to Lopressor 25mg  BID due to COPD - Heparin drip per pharmacy    CORONARY ATHEROSCLEROSIS NATIVE CORONARY ARTERY - See above  Cardiomyopathy, ischemic - See above - Echo pending  Essential  hypertension, benign - currently on NTG gtt at 80mcg.  Will see what LVF looks like on echo.  If LVF reduced will add hydralazine.  If ok then consider adding amlodipine and try to wean NTG.  Otherwise, per IM/CCM:  Obesity  Diabetes mellitus with complication  Acute renal failure  COPD exacerbation  Leukocytosis - due to IV solumedrol   Quintella ReichertURNER,TRACI R, MD  06/25/2014  8:32 AM

## 2014-06-25 NOTE — Progress Notes (Signed)
Blood present in patient's urine, called [facility] and spoke with MD Arsenio LoaderSommer.  New orders received. Ivery QualeJackson, Seng Fouts A, RN

## 2014-06-25 NOTE — Progress Notes (Signed)
PULMONARY / CRITICAL CARE MEDICINE   Name: [name] MRN: [id number] DOB: Jul 16, 1940    ADMISSION DATE:  06/23/2014 CONSULTATION DATE:  4/21  REFERRING MD :  [facility]   CHIEF COMPLAINT:  Respiratory failure   STUDIES:  2D echo 4/21>>> 4/22 CXR>> ETT 7cm from carina   SIGNIFICANT EVENTS: 4/21 Emergently intubated due to acute respiratory failure with hypoxemia and hypercapnic 4/22 to cardiac cath    BRIEF HPI: 74 yo female former smoker with multiple medical problems including O2 dependent COPD, HTN, CHF, CAD, OSA (noncompliant with CPAP), and DM who was initially admitted to [facility] 4/20 with acute on chronic respiratory failure.  She was placed on bipap and treated with BD, steroids, NTG and diuresis.  She improved initially but troponins were elevated (0.38 -> 15.13 -> 20.81 -> 11.97) and she was [facility] for [facility] eval.  On 4/21 she developed acutely worsening SOB, HTN with SBP 190's and was placed back on bipap and PCCM consulted.    SUBJECTIVE:  Afebrile Remains on vent Mildly tachy but stable BP   VITAL SIGNS: Temp:  [97.5 F (36.4 C)-98.2 F (36.8 C)] 97.8 F (36.6 C) (04/22 0730) Pulse Rate:  [34-159] 108 (04/22 0756) Resp:  [16-38] 22 (04/22 0730) BP: (68-220)/(31-127) 149/74 mmHg (04/22 0756) SpO2:  [82 %-100 %] 98 % (04/22 0730) FiO2 (%):  [40 %-100 %] 50 % (04/22 0731) Weight:  [226 lb 3.1 oz (102.6 kg)] 226 lb 3.1 oz (102.6 kg) (04/22 0330) HEMODYNAMICS:   VENTILATOR SETTINGS: Vent Mode:  [-] CPAP;PSV FiO2 (%):  [40 %-100 %] 50 % Set Rate:  [16 bmp-19 bmp] 19 bmp Vt Set:  [440 mL] 440 mL PEEP:  [5 cmH20] 5 cmH20 Pressure Support:  [15 cmH20] 15 cmH20 Plateau Pressure:  [11 cmH20-25 cmH20] 11 cmH20 INTAKE / OUTPUT:  Intake/Output Summary (Last 24 hours) at 06/25/14 0839 Last data filed at 06/25/14 0600  Gross per 24 hour  Intake 626.15 ml  Output   2625 ml  Net -1998.85 ml    PHYSICAL EXAMINATION: General:  Obese,  chronically ill appearing white female Neuro:  Alert, following commands HEENT:  poor dentition, no JVD  Cardiovascular:  s1s2 distant, mild tachy, regular Lungs: mechanical breath sounds, crackles bilat  Abdomen:  Obese, soft, non tender  Musculoskeletal:  Warm and dry, trace BLE edema   LABS:  CBC  Recent Labs Lab 06/24/14 0401 06/24/14 0935 06/25/14 0320  WBC 15.1* 15.5* 14.0*  HGB 13.2 14.0 12.7  HCT 40.4 43.2 40.2  PLT 186 213 206   Coag's No results for input(s): APTT, INR in the last 168 hours. BMET  Recent Labs Lab 06/23/14 2044 06/24/14 0935 06/25/14 0320  NA 133* 138 142  K 4.2 4.6 4.1  CL 91* 95* 98  CO2 32 28 31  BUN 47* 48* 52*  CREATININE 1.95* 1.65* 1.48*  GLUCOSE 380* 453* 372*   Electrolytes  Recent Labs Lab 06/23/14 2044 06/24/14 0935 06/25/14 0320  CALCIUM 8.9 9.1 8.8  MG  --   --  2.3  PHOS  --   --  2.8   Sepsis Markers  Recent Labs Lab 06/24/14 0935 06/24/14 1000 06/25/14 0320  LATICACIDVEN  --  1.8  --   PROCALCITON 2.25  --  1.08   ABG  Recent Labs Lab 06/24/14 1825 06/24/14 2126 06/25/14 0333  PHART 7.255* 7.404 7.397  PCO2ART 80.5* 52.2* 51.5*  PO2ART 300.0* 93.0 109.0*   Liver Enzymes  Recent Labs Lab 06/24/14 0935  AST 39*  ALT 34  ALKPHOS 61  BILITOT 0.5  ALBUMIN 3.4*   Cardiac Enzymes  Recent Labs Lab 06/24/14 0935 06/24/14 1440 06/24/14 1951  TROPONINI 3.46* 5.26* 4.63*   Glucose  Recent Labs Lab 06/24/14 0740 06/24/14 1027 06/24/14 1202 06/24/14 1604 06/24/14 2131 06/25/14 0748  GLUCAP 454* 430* 377* 271* 341* 399*    Imaging Dg Chest Port 1 View  06/24/2014   CLINICAL DATA:  Endotracheal and enteric tube placement.  EXAM: PORTABLE CHEST - 1 VIEW  COMPARISON:  Earlier same day ; 06/23/2014 ; 08/29/2013  FINDINGS: Grossly unchanged borderline enlarged cardiac silhouette and mediastinal contours with atherosclerotic plaque within the thoracic aorta. Endotracheal tube overlies the  tracheal air column with tip superior to the carina. Enteric tube tip terminates below the left hemidiaphragm. No pneumothorax. Grossly unchanged bibasilar heterogeneous opacities. No new focal airspace opacities. No pleural effusion or pneumothorax. No evidence of edema. No acute osseus abnormalities.  IMPRESSION: 1. Appropriately positioned support apparatus as above. No pneumothorax. 2. Similar findings of lung hyperexpansion and bibasilar opacities, left greater than right, likely atelectasis or scar.   Electronically Signed   By: Simonne ComeJohn  Watts M.D.   On: 06/24/2014 18:12   Dg Chest Port 1 View  06/24/2014   CLINICAL DATA:  Shortness of breath.  EXAM: PORTABLE CHEST - 1 VIEW  COMPARISON:  June 24, 2014.  FINDINGS: Stable cardiomediastinal silhouette. No pneumothorax or pleural effusion is noted. Stable linear density is noted in lingular region consistent with subsegmental atelectasis. Possible cavitary abnormality is noted in right midlung. No significant abnormality seen in the skeleton.  IMPRESSION: Stable linear density seen in lingular region consistent with subsegmental atelectasis. Possible cavitary abnormality seen right mid lung. CT scan of the chest is recommended for further evaluation.   Electronically Signed   By: Lupita RaiderJames  Green Jr, M.D.   On: 06/24/2014 12:27   Dg Chest Port 1 View  06/24/2014   CLINICAL DATA:  Acute onset shortness of breath and weakness  EXAM: PORTABLE CHEST - 1 VIEW  COMPARISON:  06/23/2014  FINDINGS: Stable cardiomegaly with improving bibasilar atelectasis. No current pneumonia, edema, collapse or consolidation. No effusion or pneumothorax. Trachea midline. Degenerative changes of the spine and shoulders.  IMPRESSION: cardiomegaly with improving bibasilar atelectasis.   Electronically Signed   By: Judie PetitM.  Shick M.D.   On: 06/24/2014 08:40     ASSESSMENT / PLAN:  PULMONARY Acute on chronic respiratory failure - multifactorial in setting acute on chronic sCHF, NSTEMI, OSA  and underlying severe COPD.  OSA AECOPD  P:   Daily SBT Diuresis per cards as able  Solumedrol  BD's - duonebs, PRN albuterol  Anxiolytic PRN  IV levaquin  Advance ETT 4cm, repeat CXR  CARDIOVASCULAR HTN  Acute on chronic HFrEF -- EF 35% 2008  NSTEMI - peak troponin 20.8, trending down CAD  Ischemic cardiomyopathy P:  Cardiology following-- per Dr. Mendel Corninguner, pt will undergo cardiac cath today Stat echo pending  Trend troponin  Cont nitro gtt, asa, imdur, heparin drip    RENAL AKI -Cr improving, good UOP with diuresis, possible cardiorenal syndrome Hyponatremia  P:   BMET in am  Lasix 60mg  IV once  GASTROINTESTINAL No active issue  P:   PPI  NPO   HEMATOLOGIC No active issue  P:  Heparin gtt  F/u cbc   INFECTIOUS Leukocytosis - mild.   AECOPD  P:   Levaquin 4/20>>>  ENDOCRINE DM - poorly controlled  P:   Cont lantus, SSI  May need increase lantus -hold for now while intubated  NEUROLOGIC Anxiety  P:   Cont PRN xanax  Avoid oversedation    FAMILY  - Updates:  No family available 4/22  Summary: 74 yr old woman with AECOPD, Acute on chronic heart failure with EF of 35%, DM2, CAD, presenting from [facility] with NSTEMI. Emergently intubated on 4/20 due to hypoxemic and hypercapnic respiratory failure. Partial code with wishes for short term vent support only. AKI improving with diuresis, weight down by 4 lbs since admission with good UOP. Will continue diuresis and monitor respiratory improvement with daily SBT. Troponin trending down, will continue heparin drip, nitro drip, Imdur and ASA for now. Per cardiology will undergo Cardiac cath today.   Ky Barban, MD IMTS, PGY3 06/25/2014  8:39 AM

## 2014-06-25 NOTE — Progress Notes (Signed)
RT advanced pt ETT 4cm per MD order. Patient tolerated well. RT also returned patient back to full support due to accessory muscle use. No complications. Chest xray pending tube placement. RT will continue to monitor.

## 2014-06-25 NOTE — Progress Notes (Signed)
INITIAL NUTRITION ASSESSMENT  DOCUMENTATION CODES Per approved criteria  -Obesity Unspecified   INTERVENTION: If pt remains on ventilator, recommend to Initiate TF via OGT with Vital HP at 20 ml/h and increase by 10 ml every 4 hr to goal rate of 50 ml/h to provide 1200 kcals (100% of estimated calorie needs), 105 gm protein (meets 96% of estimated protein needs), 1008 ml free water daily.  NUTRITION DIAGNOSIS: Inadequate oral intake related to inability to eat as evidenced by NPO status.   Goal: Enteral nutrition to provide 60 - 70% of estimated calorie needs and 100% of estimated protein needs, based on ASPEN guidelines for hypocaloric, high protein feeding in critically ill obese individuals.  Monitor:  Vent status, TF adequacy and tolerance, labs, weight status, I/Os  Reason for Assessment: Vemtilator  74 y.o. female  Admitting Dx: Acute on chronic respiratory failure  ASSESSMENT: 74 yo female h/o chf, copd, on 2 liters oxygen at home, OSA cpap dep qhs, CAD [facility] hypoxic and hypercapneic respiratory failure required bipap through the night. Was thought to be due to chf, trop peaked at 20. Cardiology was consulted, who recommended [facility], [facility] by [facility] team for NSTEMI. After they further reviewed her chart, felt pt resp failure was due to pulm issues rather than chf.  Pt discussed with RN, who stated that pt is being weaned off vent and is expected to be extubated tomorrow at the latest. Pt was being taken for catheterization at time of visit.  Pt is currently intubated on ventilator support but is awake and alert.   Labs reviewed: BUN 52, Cr 1.48, Glu 372 MVE: 10.3 ml Propofol: none Temp (24hrs), Avg:98.1 F (36.7 C), Min:97.5 F (36.4 C), Max:99.1 F (37.3 C)  Height: Ht Readings from Last 1 Encounters:  06/25/14 5\' 4"  (1.626 m)   Weight: Wt Readings from Last 1 Encounters:  06/25/14 226 lb 3.1  oz (102.6 kg)    Ideal Body Weight: 120 Lb  % Ideal Body Weight: 188%  Wt Readings from Last 10 Encounters:  06/25/14 226 lb 3.1 oz (102.6 kg)  05/21/14 233 lb (105.688 kg)  05/17/14 235 lb (106.595 kg)  05/04/14 235 lb 9.6 oz (106.867 kg)  04/02/14 228 lb (103.42 kg)  03/24/14 236 lb (107.049 kg)  03/15/14 233 lb (105.688 kg)  02/02/14 223 lb 3.2 oz (101.243 kg)  01/27/14 221 lb (100.245 kg)  01/08/14 221 lb (100.245 kg)    Usual Body Weight: 234  % Usual Body Weight: 96%  BMI:  Body mass index is 38.81 kg/(m^2). obese  Estimated Nutritional Needs: Kcal: 1128 - 1436 Protein: 109 g Fluid: per MD  Skin: WDL  Diet Order: Diet NPO time specified  EDUCATION NEEDS: -Education not appropriate at this time   Intake/Output Summary (Last 24 hours) at 06/25/14 1451 Last data filed at 06/25/14 1400  Gross per 24 hour  Intake  719.5 ml  Output   1925 ml  Net -1205.5 ml    Last BM: PTA   Labs:   Recent Labs Lab 06/23/14 2044 06/24/14 0935 06/25/14 0320  NA 133* 138 142  K 4.2 4.6 4.1  CL 91* 95* 98  CO2 32 28 31  BUN 47* 48* 52*  CREATININE 1.95* 1.65* 1.48*  CALCIUM 8.9 9.1 8.8  MG  --   --  2.3  PHOS  --   --  2.8  GLUCOSE 380* 453* 372*  CBG (last 3)   Recent Labs  06/24/14 2131 06/25/14 0748 06/25/14 1242  GLUCAP 341* 399* 349*    Scheduled Meds: . antiseptic oral rinse  7 mL Mouth Rinse q12n4p  . [START ON 06/27/2014] aspirin EC  81 mg Oral Daily  . atorvastatin  40 mg Oral q1800  . chlorhexidine  15 mL Mouth Rinse BID  . Chlorhexidine Gluconate Cloth  6 each Topical Q0600  . insulin aspart  0-20 Units Subcutaneous 6 times per day  . insulin aspart  0-5 Units Subcutaneous QHS  . insulin aspart  3 Units Subcutaneous TID WC  . insulin glargine  20 Units Subcutaneous BID  . ipratropium-albuterol  3 mL Nebulization Q6H  . [START ON 06/26/2014] isosorbide mononitrate  30 mg Oral Daily  . levofloxacin (LEVAQUIN) IV  750 mg Intravenous Q48H   . methylPREDNISolone (SOLU-MEDROL) injection  80 mg Intravenous Q6H  . metoprolol tartrate  25 mg Oral BID  . mupirocin ointment  1 application Nasal BID  . sodium chloride  3 mL Intravenous Q12H    Continuous Infusions: . [START ON 06/26/2014] sodium chloride    . heparin 750 Units/hr (06/25/14 0759)  . nitroGLYCERIN 40 mcg/min (06/25/14 1400)    Past Medical History  Diagnosis Date  . COPD (chronic obstructive pulmonary disease)     Oxygen prn ambulation  . Hyperlipidemia   . Essential hypertension, benign   . Anemia   . Anxiety   . Coronary atherosclerosis of native coronary artery     a. history of MI with PTCA/stent to LAD 07/1998. b. Repeat PTCA to this lesion in 02/1999. c. PTCA/brachytherapy to mLAD in 2001  . Osteoporosis   . Venous insufficiency   . Obesity   . Asthma   . Herpes zoster 06/2013  . Tobacco abuse   . Chronic respiratory failure   . GERD (gastroesophageal reflux disease)   . On home oxygen therapy     "2L all the time" (06/23/2014)  . Myocardial infarction 2000    2000  . Anginal pain     "light ones q now and then" (06/23/2014)  . Chronic bronchitis     "q fall and q spring" (06/23/2014)  . OSA (obstructive sleep apnea)     "does not wear CPAP"/daughters at bedside on 06/23/2014  . Type 2 diabetes mellitus   . History of blood transfusion X 1    "related to bleeding ulcers"  . History of bleeding ulcers   . Arthritis     "loaded w/it" (06/23/2014)  . DDD (degenerative disc disease), lumbar   . Osteoarthritis     Past Surgical History  Procedure Laterality Date  . Total abdominal hysterectomy      DUB (non-malignant reasons).  No hx of abnormal pap smears.  . Shoulder open rotator cuff repair Right   . Cataract extraction w/phaco Left 06/30/2012    Procedure: CATARACT EXTRACTION PHACO AND INTRAOCULAR LENS PLACEMENT (IOC);  Surgeon: Gemma Payor, MD;  Location: [facility];  Service: Ophthalmology;  Laterality: Left;  CDE:12.32  . Transthoracic  echocardiogram  06/2013    EF 40%, + WMA  . Alveoloplasty  10/1999    [name] 07/19/2010 Surgical extraction of root segments #21, 22 and 27 with alveoloplasty.  . Cardiac catheterization  07/2012    + progressive CAD but no lesions to intervene upon, EF 65%; med mgmt continued  . Coronary angioplasty with stent placement      PTCA/BMS LAD 2000, PTCA/brachytherapy LAD 2001, LVEF 55%   .  Coronary angioplasty with stent placement  2000; 2001; 2002    [name] 05/18/2010  . Tonsillectomy  1949  . Laparoscopic cholecystectomy    . Cataract extraction w/ intraocular lens implant Right 2015?    Alford Gamero A. [facility] Dietetic Intern Pager: [phone] 06/25/2014 3:04 PM

## 2014-06-26 DIAGNOSIS — I5021 Acute systolic (congestive) heart failure: Secondary | ICD-10-CM

## 2014-06-26 DIAGNOSIS — J9620 Acute and chronic respiratory failure, unspecified whether with hypoxia or hypercapnia: Secondary | ICD-10-CM

## 2014-06-26 LAB — CBC
HCT: 43.2 % (ref 36.0–46.0)
Hemoglobin: 13.7 g/dL (ref 12.0–15.0)
MCH: 30.5 pg (ref 26.0–34.0)
MCHC: 31.7 g/dL (ref 30.0–36.0)
MCV: 96.2 fL (ref 78.0–100.0)
Platelets: 167 10*3/uL (ref 150–400)
RBC: 4.49 MIL/uL (ref 3.87–5.11)
RDW: 12.5 % (ref 11.5–15.5)
WBC: 12.6 10*3/uL — AB (ref 4.0–10.5)

## 2014-06-26 LAB — GLUCOSE, CAPILLARY
GLUCOSE-CAPILLARY: 139 mg/dL — AB (ref 70–99)
GLUCOSE-CAPILLARY: 183 mg/dL — AB (ref 70–99)
GLUCOSE-CAPILLARY: 198 mg/dL — AB (ref 70–99)
GLUCOSE-CAPILLARY: 254 mg/dL — AB (ref 70–99)
Glucose-Capillary: 158 mg/dL — ABNORMAL HIGH (ref 70–99)
Glucose-Capillary: 211 mg/dL — ABNORMAL HIGH (ref 70–99)
Glucose-Capillary: 148 mg/dL — ABNORMAL HIGH (ref 70–99)

## 2014-06-26 LAB — HEPARIN LEVEL (UNFRACTIONATED): Heparin Unfractionated: <0.1 IU/mL — ABNORMAL LOW (ref 0.30–0.70)

## 2014-06-26 LAB — PROCALCITONIN: PROCALCITONIN: 0.3 ng/mL

## 2014-06-26 MED ORDER — FENTANYL CITRATE (PF) 100 MCG/2ML IJ SOLN
12.5000 ug | INTRAMUSCULAR | Status: DC | PRN
  Administered 2014-06-26: 12.5 ug via INTRAVENOUS

## 2014-06-26 MED ORDER — FUROSEMIDE 10 MG/ML IJ SOLN
80.0000 mg | Freq: Once | INTRAMUSCULAR | Status: AC
  Administered 2014-06-26: 80 mg via INTRAVENOUS

## 2014-06-26 MED ORDER — FENTANYL CITRATE (PF) 100 MCG/2ML IJ SOLN
INTRAMUSCULAR | Status: AC
  Filled 2014-06-26: qty 2

## 2014-06-26 MED ORDER — METHYLPREDNISOLONE SODIUM SUCC 125 MG IJ SOLR
80.0000 mg | Freq: Two times a day (BID) | INTRAMUSCULAR | Status: DC
  Administered 2014-06-26 – 2014-06-27 (×2): 80 mg via INTRAVENOUS
  Filled 2014-06-26 (×2): qty 1.28

## 2014-06-26 MED ORDER — FUROSEMIDE 40 MG PO TABS
40.0000 mg | ORAL_TABLET | Freq: Every day | ORAL | Status: DC
  Administered 2014-06-26: 40 mg via ORAL
  Filled 2014-06-26 (×2): qty 1

## 2014-06-26 MED ORDER — BISOPROLOL FUMARATE 5 MG PO TABS
2.5000 mg | ORAL_TABLET | Freq: Every day | ORAL | Status: DC
  Administered 2014-06-26 – 2014-06-29 (×4): 2.5 mg via ORAL
  Filled 2014-06-26 (×4): qty 0.5

## 2014-06-26 MED ORDER — FUROSEMIDE 10 MG/ML IJ SOLN
INTRAMUSCULAR | Status: AC
  Filled 2014-06-26: qty 8

## 2014-06-26 MED ORDER — CHLORHEXIDINE GLUCONATE 0.12 % MT SOLN: 15.0000 mL | Freq: Two times a day (BID) | OROMUCOSAL | Status: DC

## 2014-06-26 MED ORDER — CETYLPYRIDINIUM CHLORIDE 0.05 % MT LIQD: 7.0000 mL | Freq: Two times a day (BID) | OROMUCOSAL | Status: DC

## 2014-06-26 MED ORDER — LOSARTAN POTASSIUM 50 MG PO TABS
50.0000 mg | ORAL_TABLET | Freq: Every day | ORAL | Status: DC
  Administered 2014-06-26 – 2014-06-29 (×4): 50 mg via ORAL
  Filled 2014-06-26 (×4): qty 1

## 2014-06-26 NOTE — Progress Notes (Signed)
PULMONARY / CRITICAL CARE MEDICINE   Name: [name] MRN: [id number] DOB: [birth year]    ADMISSION DATE:  06/23/2014 CONSULTATION DATE:  4/21  REFERRING MD :  [facility]   CHIEF COMPLAINT:  Respiratory failure    BRIEF SUMMARY: 74 yo female former smoker with multiple medical problems including O2 dependent COPD, HTN, CHF, CAD, OSA (noncompliant with CPAP), and DM who was initially admitted to [facility] 4/20 with acute on chronic respiratory failure.  She was placed on bipap and treated with BD, steroids, NTG and diuresis.  She improved initially but troponins were elevated (0.38 -> 15.13 -> 20.81 -> 11.97) and she was tx 4/20 to [facility] for [facility]ology eval.  On 4/21 she developed acutely worsening SOB, HTN with SBP 190's and was placed back on bipap and PCCM consulted.   STUDIES:  4/21  2D echo >> mild LVH, systolic mod-severely reduced, EF 30-35%, trivial pericardial effusion, anterior/apical WMA 4/22  CXR >> ETT 7cm from carina   SIGNIFICANT EVENTS: 4/21  Emergently intubated due to acute respiratory failure with hypoxemia and hypercapnic 4/22  LHC >> stable 2 vessel CAD, rec's for medical management. 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  Hematuria noted overnight    VITAL SIGNS: Temp:  [96.5 F (35.8 C)-99 F (37.2 C)] 96.6 F (35.9 C) (04/23 1200) Pulse Rate:  [71-112] 85 (04/23 1147) Resp:  [17-24] 18 (04/23 1300) BP: (106-181)/(35-131) 156/65 mmHg (04/23 1300) SpO2:  [97 %-100 %] 100 % (04/23 1200) FiO2 (%):  [40 %-50 %] 40 % (04/23 1305) Weight:  [220 lb 14.4 oz (100.2 kg)] 220 lb 14.4 oz (100.2 kg) (04/23 0400)   HEMODYNAMICS:     VENTILATOR SETTINGS: Vent Mode:  [-] PRVC FiO2 (%):  [40 %-50 %] 40 % Set Rate:  [19 bmp] 19 bmp Vt Set:  [440 mL] 440 mL PEEP:  [5 cmH20] 5 cmH20 Pressure Support:  [5 cmH20] 5 cmH20 Plateau Pressure:  [11 cmH20-22 cmH20] 11 cmH20   INTAKE / OUTPUT:  Intake/Output Summary (Last 24 hours) at  06/26/14 1318 Last data filed at 06/26/14 1100  Gross per 24 hour  Intake 729.33 ml  Output   1350 ml  Net -620.67 ml    PHYSICAL EXAMINATION: General:  Obese, chronically ill appearing white female on vent in NAD Neuro:  Alert, following commands HEENT:  poor dentition, no JVD  Cardiovascular:  s1s2 distant, mild tachy, regular Lungs: even non-labored on vent, lungs bilaterally diminished   Abdomen:  Obese, soft, non tender  Musculoskeletal:  Warm and dry, trace BLE edema   LABS:  CBC  Recent Labs Lab 06/24/14 0935 06/25/14 0320 06/26/14 0746  WBC 15.5* 14.0* 12.6*  HGB 14.0 12.7 13.7  HCT 43.2 40.2 43.2  PLT 213 206 167   Coag's  Recent Labs Lab 06/25/14 1232 06/25/14 2137  APTT  --  25  INR 0.94 0.96   BMET  Recent Labs Lab 06/23/14 2044 06/24/14 0935 06/25/14 0320  NA 133* 138 142  K 4.2 4.6 4.1  CL 91* 95* 98  CO2 32 28 31  BUN 47* 48* 52*  CREATININE 1.95* 1.65* 1.48*  GLUCOSE 380* 453* 372*   Electrolytes  Recent Labs Lab 06/23/14 2044 06/24/14 0935 06/25/14 0320  CALCIUM 8.9 9.1 8.8  MG  --   --  2.3  PHOS  --   --  2.8   Sepsis Markers  Recent Labs Lab 06/24/14 0935 06/24/14 1000 06/25/14 0320 06/26/14  [id number]  LATICACIDVEN  --  1.8  --   --   PROCALCITON 2.25  --  1.08 0.30   ABG  Recent Labs Lab 06/24/14 2126 06/25/14 0333 06/25/14 1443  PHART 7.404 7.397 7.299*  PCO2ART 52.2* 51.5* 34.1*  PO2ART 93.0 109.0* 84.0   Liver Enzymes  Recent Labs Lab 06/24/14 0935  AST 39*  ALT 34  ALKPHOS 61  BILITOT 0.5  ALBUMIN 3.4*   Cardiac Enzymes  Recent Labs Lab 06/24/14 0935 06/24/14 1440 06/24/14 1951  TROPONINI 3.46* 5.26* 4.63*   Glucose  Recent Labs Lab 06/25/14 1242 06/25/14 1650 06/25/14 2039 06/25/14 2354 06/26/14 0403 06/26/14 0816  GLUCAP 349* 162* 161* 198* 158* 183*    Imaging Dg Chest Port 1 View  06/25/2014   CLINICAL DATA:  Repositioning of endotracheal tube.  EXAM: PORTABLE CHEST - 1  VIEW  COMPARISON:  Single view of the chest earlier this same day.  FINDINGS: Endotracheal tube is in place with the tip in good position 3.4 cm above the carina. NG tube courses into the stomach and below the inferior margin of film. The chest is hyperexpanded compatible with emphysema. Mild basilar atelectasis is noted. Heart size is upper normal.  IMPRESSION: ET tube projects in good position.  Pulmonary hyperexpansion compatible with emphysema with mild bi basilar atelectasis noted.   Electronically Signed   By: Drusilla Kannerhomas  Dalessio M.D.   On: 06/25/2014 13:29   Dg Chest Portable 1 View  06/25/2014   CLINICAL DATA:  Respiratory failure  EXAM: PORTABLE CHEST - 1 VIEW  COMPARISON:  06/24/2014  FINDINGS: Endotracheal tube is 7 cm above the carina. NG tube enters the stomach. Patchy lingular and right basilar airspace opacity, similar prior study. Increasing patchy right upper lobe opacity. No effusions. No acute bony abnormality.  IMPRESSION: Patchy bibasilar opacities are stable. Slight increased right upper lobe airspace opacity.  Endotracheal tube retracted, now 7 cm above the carina.   Electronically Signed   By: Charlett NoseKevin  Dover M.D.   On: 06/25/2014 04:42     ASSESSMENT / PLAN:  PULMONARY Acute on chronic respiratory failure - multifactorial in setting acute on chronic sCHF, NSTEMI, OSA and underlying severe COPD.  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   AECOPD  P:   Levaquin 4/20, D4/x>>  ENDOCRINE DM - poorly controlled  P:   Cont lantus, SSI  May need increase lantus - hold for now while intubated  NEUROLOGIC Anxiety  P:   Cont PRN xanax  Avoid oversedation    FAMILY  - Updates:  No family available 4/23.  Consider SBT with extubation.   Canary BrimBrandi Jorgen Wolfinger, NP-C [facility] Pgr: [phone] or [phone]    06/26/2014  1:18  PM

## 2014-06-26 NOTE — Procedures (Signed)
Extubation Procedure Note  Patient Details:   Name: Harold Hedgera F Corpuz DOB: 11/06/1940 MRN: [id number]   Pt extubated to BIPAP per MD order. Pt able to vocalize, no stridor noted, VS WNL. Pt tolerating well at this time. RT will continue to monitor.     Evaluation  O2 sats: stable throughout Complications: No apparent complications Patient did tolerate procedure well. Bilateral Breath Sounds: Diminished Suctioning: Airway Yes  Harley HallmarkKeen, Graeson Nouri Lyman 06/26/2014, 2:38 PM

## 2014-06-26 NOTE — Progress Notes (Signed)
SUBJECTIVE:  Remains intubated. Awake and agitated.   Cath yesterday with stable CAD. Echo 4/22 with EF 30-35% with anterior and apical WMA. 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 antiseptic oral rinse  7 mL Mouth Rinse q12n4p  . [START ON 06/27/2014] aspirin EC  81 mg Oral Daily  . atorvastatin  40 mg Oral q1800  . chlorhexidine  15 mL Mouth Rinse BID  . Chlorhexidine Gluconate Cloth  6 each Topical Q0600  . heparin  5,000 Units Subcutaneous 3 times per day  . insulin aspart  0-20 Units Subcutaneous 6 times per day  . insulin glargine  20 Units Subcutaneous BID  . ipratropium-albuterol  3 mL Nebulization Q6H  . isosorbide mononitrate  30 mg Oral Daily  . levofloxacin (LEVAQUIN) IV  750 mg Intravenous Q48H  . methylPREDNISolone (SOLU-MEDROL) injection  80 mg Intravenous Q6H  . metoprolol tartrate  25 mg Oral BID  . mupirocin ointment  1 application Nasal BID  . sodium chloride  3 mL Intravenous Q12H    PHYSICAL EXAM General: Intubated and awake. Agitated  Head: Eyes PERRLA, No xanthomas.   Normal cephalic and atramatic  Lungs:  Decreased BS throughout with no crackles or wheezes Heart:  Tachy regular S1 S2 Pulses are 2+ & equal. Abdomen: Bowel sounds are positive, abdomen soft and non-tender without masses Extremities:   No clubbing, cyanosis or edema.  DP +1  Foley in place Neuro: Alert and oriented X 3. Psych:  Good affect, responds appropriately   LABS: Basic Metabolic Panel:  Recent Labs  16/10/96 0935 06/25/14 0320  NA 138 142  K 4.6 4.1  CL 95* 98  CO2 28 31  GLUCOSE 453* 372*  BUN 48* 52*  CREATININE 1.65* 1.48*  CALCIUM 9.1 8.8  MG  --  2.3  PHOS  --  2.8   Liver Function Tests:  Recent Labs  06/24/14 0935  AST 39*  ALT 34  ALKPHOS 61  BILITOT 0.5  PROT 6.4  ALBUMIN 3.4*   No results for input(s): LIPASE, AMYLASE in the last 72 hours. CBC:  Recent Labs  06/23/14 2044  06/24/14 0935 06/25/14 0320  WBC 17.6*  < > 15.5* 14.0*  NEUTROABS 15.3*  --   --   --   HGB 13.4  < > 14.0 12.7  HCT 41.9  < > 43.2 40.2  MCV 93.5  < > 93.5 94.8  PLT 234  < > 213 206  < > = values in this interval not displayed. Cardiac Enzymes:  Recent Labs  06/24/14 0935 06/24/14 1440 06/24/14 1951  TROPONINI 3.46* 5.26* 4.63*   BNP: Invalid input(s): POCBNP D-Dimer: No results for input(s): DDIMER in the last 72 hours. Hemoglobin A1C: No results for input(s): HGBA1C in the last 72 hours. Fasting Lipid Panel: No results for input(s):  CHOL, HDL, LDLCALC, TRIG, CHOLHDL, LDLDIRECT in the last 72 hours. Thyroid Function Tests: No results for input(s): TSH, T4TOTAL, T3FREE, THYROIDAB in the last 72 hours.  Invalid input(s): FREET3 Anemia Panel: No results for input(s): VITAMINB12, FOLATE, FERRITIN, TIBC, IRON, RETICCTPCT in the last 72 hours. Coag Panel:   Lab Results  Component Value Date   INR 0.96 06/25/2014   INR 0.94 06/25/2014   INR 0.90 07/04/2012    RADIOLOGY: Dg Chest 2 View  06/23/2014   CLINICAL DATA:  74 year old female with a history of shortness of breath for 3 days  EXAM: CHEST - 2 VIEW  COMPARISON:  Plain film 08/29/2013, 06/22/2014  FINDINGS: Cardiomediastinal silhouette unchanged in size and contour. Coronary stents in place.  Similar appearance of mild interstitial and airspace opacities.  No pleural effusion or pneumothorax.  No displaced fracture.  Unremarkable appearance of the upper abdomen.  IMPRESSION: Mixed interstitial and airspace  opacities are nonspecific, potentially representing edema, atelectasis, and/ or atypical infection/consolidation.  Coronary artery disease.  Signed,  Yvone NeuJaime S. Loreta AveWagner, DO  [facility]  [facility]   Electronically Signed   By: Gilmer MorJaime  Wagner D.O.   On: 06/23/2014 22:03   Dg Chest Port 1 View  06/25/2014   CLINICAL DATA:  Repositioning of endotracheal tube.  EXAM: PORTABLE CHEST - 1 VIEW  COMPARISON:  Single view of the chest earlier this same day.  FINDINGS: Endotracheal tube is in place with the tip in good position 3.4 cm above the carina. NG tube courses into the stomach and below the inferior margin of film. The chest is hyperexpanded compatible with emphysema. Mild basilar atelectasis is noted. Heart size is upper normal.  IMPRESSION: ET tube projects in good position.  Pulmonary hyperexpansion compatible with emphysema with mild bi basilar atelectasis noted.   Electronically Signed   By: Drusilla Kannerhomas  Dalessio M.D.   On: 06/25/2014 13:29   Dg Chest Portable 1 View  06/25/2014   CLINICAL DATA:  Respiratory failure  EXAM: PORTABLE CHEST - 1 VIEW  COMPARISON:  06/24/2014  FINDINGS: Endotracheal tube is 7 cm above the carina. NG tube enters the stomach. Patchy lingular and right basilar airspace opacity, similar prior study. Increasing patchy right upper lobe opacity. No effusions. No acute bony abnormality.  IMPRESSION: Patchy bibasilar opacities are stable. Slight increased right upper lobe airspace opacity.  Endotracheal tube retracted, now 7 cm above the carina.   Electronically Signed   By: Charlett NoseKevin  Dover M.D.   On: 06/25/2014 04:42   Dg Chest Port 1 View  06/24/2014   CLINICAL DATA:  Endotracheal and enteric tube placement.  EXAM: PORTABLE CHEST - 1 VIEW  COMPARISON:  Earlier same day ; 06/23/2014 ; 08/29/2013  FINDINGS: Grossly unchanged borderline enlarged cardiac silhouette and mediastinal contours with atherosclerotic plaque within the thoracic aorta.  Endotracheal tube overlies the tracheal air column with tip superior to the carina. Enteric tube tip terminates below the left hemidiaphragm. No pneumothorax. Grossly unchanged bibasilar heterogeneous opacities. No new focal airspace opacities. No pleural effusion or pneumothorax. No evidence of edema. No acute osseus abnormalities.  IMPRESSION: 1. Appropriately positioned support apparatus as above. No pneumothorax. 2. Similar findings of lung hyperexpansion and bibasilar opacities, left greater than right, likely atelectasis or scar.   Electronically Signed   By: Simonne ComeJohn  Watts M.D.   On: 06/24/2014 18:12   Dg Chest Port 1 View  06/24/2014   CLINICAL DATA:  Shortness of breath.  EXAM: PORTABLE CHEST - 1 VIEW  COMPARISON:  June 24, 2014.  FINDINGS: Stable cardiomediastinal silhouette. No pneumothorax or pleural effusion is noted. Stable linear density is noted in lingular region consistent with subsegmental atelectasis. Possible cavitary abnormality is noted in right midlung. No significant abnormality seen in the skeleton.  IMPRESSION: Stable linear density seen in lingular region consistent with subsegmental atelectasis. Possible cavitary abnormality seen right mid lung. CT scan of the chest is recommended for further evaluation.   Electronically Signed   By: Lupita Raider, M.D.   On: 06/24/2014 12:27   Dg Chest Port 1 View  06/24/2014   CLINICAL DATA:  Acute onset shortness of breath and weakness  EXAM: PORTABLE CHEST - 1 VIEW  COMPARISON:  06/23/2014  FINDINGS: Stable cardiomegaly with improving bibasilar atelectasis. No current pneumonia, edema, collapse or consolidation. No effusion or pneumothorax. Trachea midline. Degenerative changes of the spine and shoulders.  IMPRESSION: cardiomegaly with improving bibasilar atelectasis.   Electronically Signed   By: Judie Petit.  Shick M.D.   On: 06/24/2014 08:40   ASSESSMENT AND PLAN:  Principal Problem:  1. Acute on chronic respiratory failure -  This appears to  be mainly COPD exacerbation complicated by acute diastolic CHF although BNP only mildly elevated and chest xray shows no edema.  Await echo results.   2. NSTEMI - Troponin at [facility] peak 20.81. 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 Acute systolic HF - Echo 4/22 - EF 30-35%. 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 COPD 5. Essential hypertension, benign - currently on NTG gtt at .  Will see what LVF looks like on echo.  If LVF reduced will add hydralazine.  If ok then consider adding amlodipine and try to wean NTG. 6. Obesity 7. DM 8. CKD, stage III - stable  Very agitated. Hopefully can be extubated today. 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 Will restart losartan. Switch metoprolol to bisoprolol. Start po lasix.   Arvilla Meres, MD  06/26/2014  8:36 AM

## 2014-06-27 ENCOUNTER — Inpatient Hospital Stay (HOSPITAL_COMMUNITY): Payer: PPO

## 2014-06-27 DIAGNOSIS — J9601 Acute respiratory failure with hypoxia: Secondary | ICD-10-CM

## 2014-06-27 DIAGNOSIS — J9600 Acute respiratory failure, unspecified whether with hypoxia or hypercapnia: Secondary | ICD-10-CM

## 2014-06-27 LAB — CBC
HCT: 47.2 % — ABNORMAL HIGH (ref 36.0–46.0)
Hemoglobin: 14.6 g/dL (ref 12.0–15.0)
MCH: 30.4 pg (ref 26.0–34.0)
MCHC: 30.9 g/dL (ref 30.0–36.0)
MCV: 98.1 fL (ref 78.0–100.0)
Platelets: 192 10*3/uL (ref 150–400)
RBC: 4.81 MIL/uL (ref 3.87–5.11)
RDW: 12.5 % (ref 11.5–15.5)
WBC: 14.5 10*3/uL — ABNORMAL HIGH (ref 4.0–10.5)

## 2014-06-27 LAB — GLUCOSE, CAPILLARY
GLUCOSE-CAPILLARY: 392 mg/dL — AB (ref 70–99)
GLUCOSE-CAPILLARY: 346 mg/dL — AB (ref 70–99)
Glucose-Capillary: 187 mg/dL — ABNORMAL HIGH (ref 70–99)
Glucose-Capillary: 230 mg/dL — ABNORMAL HIGH (ref 70–99)
Glucose-Capillary: 258 mg/dL — ABNORMAL HIGH (ref 70–99)

## 2014-06-27 LAB — BASIC METABOLIC PANEL
ANION GAP: 14 (ref 5–15)
BUN: 54 mg/dL — AB (ref 6–23)
CALCIUM: 9.3 mg/dL (ref 8.4–10.5)
CO2: 39 mmol/L — AB (ref 19–32)
Chloride: 100 mmol/L (ref 96–112)
Creatinine, Ser: 1.44 mg/dL — ABNORMAL HIGH (ref 0.50–1.10)
GFR calc non Af Amer: 35 mL/min — ABNORMAL LOW (ref >90)
GFR, EST AFRICAN AMERICAN: 41 mL/min — AB (ref >90)
GLUCOSE: 233 mg/dL — AB (ref 70–99)
POTASSIUM: 4 mmol/L (ref 3.5–5.1)
SODIUM: 153 mmol/L — AB (ref 135–145)

## 2014-06-27 LAB — CLOSTRIDIUM DIFFICILE BY PCR: Toxigenic C. Difficile by PCR: NEGATIVE

## 2014-06-27 LAB — BRAIN NATRIURETIC PEPTIDE: B NATRIURETIC PEPTIDE 5: 189.3 pg/mL — AB (ref 0.0–100.0)

## 2014-06-27 MED ORDER — LEVOFLOXACIN 500 MG PO TABS
500.0000 mg | ORAL_TABLET | Freq: Every day | ORAL | Status: AC
  Administered 2014-06-27 – 2014-06-28 (×2): 500 mg via ORAL
  Filled 2014-06-27 (×2): qty 1

## 2014-06-27 MED ORDER — PREDNISONE 20 MG PO TABS
40.0000 mg | ORAL_TABLET | Freq: Every day | ORAL | Status: DC
  Administered 2014-06-28 – 2014-06-29 (×2): 40 mg via ORAL
  Filled 2014-06-27 (×3): qty 2

## 2014-06-27 MED ORDER — BUDESONIDE 0.25 MG/2ML IN SUSP
0.2500 mg | Freq: Two times a day (BID) | RESPIRATORY_TRACT | Status: DC
  Administered 2014-06-27 – 2014-06-29 (×4): 0.25 mg via RESPIRATORY_TRACT
  Filled 2014-06-27 (×7): qty 2

## 2014-06-27 MED ORDER — IPRATROPIUM-ALBUTEROL 0.5-2.5 (3) MG/3ML IN SOLN
3.0000 mL | Freq: Four times a day (QID) | RESPIRATORY_TRACT | Status: DC
  Administered 2014-06-28 – 2014-06-29 (×6): 3 mL via RESPIRATORY_TRACT
  Filled 2014-06-27 (×6): qty 3

## 2014-06-27 MED ORDER — INSULIN ASPART 100 UNIT/ML ~~LOC~~ SOLN
0.0000 [IU] | Freq: Three times a day (TID) | SUBCUTANEOUS | Status: DC
  Administered 2014-06-27: 15 [IU] via SUBCUTANEOUS
  Administered 2014-06-27: 20 [IU] via SUBCUTANEOUS
  Administered 2014-06-28 (×2): 11 [IU] via SUBCUTANEOUS
  Administered 2014-06-29: 4 [IU] via SUBCUTANEOUS

## 2014-06-27 MED ORDER — ENOXAPARIN SODIUM 60 MG/0.6ML ~~LOC~~ SOLN
50.0000 mg | SUBCUTANEOUS | Status: DC
  Administered 2014-06-27 – 2014-06-29 (×3): 50 mg via SUBCUTANEOUS
  Filled 2014-06-27 (×3): qty 0.6

## 2014-06-27 MED ORDER — CETYLPYRIDINIUM CHLORIDE 0.05 % MT LIQD
7.0000 mL | Freq: Two times a day (BID) | OROMUCOSAL | Status: DC
  Administered 2014-06-28 – 2014-06-29 (×3): 7 mL via OROMUCOSAL

## 2014-06-27 MED ORDER — METHYLPREDNISOLONE SODIUM SUCC 125 MG IJ SOLR: 60.0000 mg | Freq: Two times a day (BID) | INTRAMUSCULAR | Status: DC

## 2014-06-27 NOTE — Progress Notes (Signed)
Patient refused CPAP tonight. There Is a machine in the room at this time. RN aware. Explained to Patient that if they changed their mind, to just have the RN call Respiratory and we would come set them up. 

## 2014-06-27 NOTE — Progress Notes (Signed)
SUBJECTIVE:   Extubated 4/23  Cath 4/22 with stable CAD. Echo 4/22 with EF 30-35% with anterior and apical WMA. 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 Losartan restarted yesterday. Lopressor switched to bisoprolol.   Sitting in chair. No complaints at rest. SOB with minimal activity. Off IV NTG/ SBP 120s this am  OBJECTIVE:   Vitals:   Filed Vitals:   06/27/14 0900 06/27/14 0903 06/27/14 0918 06/27/14 1000  BP: 180/78  121/70 128/43  Pulse: 110  73 108  Temp:      TempSrc:      Resp: Height:      Weight:      SpO2: 97% 98% 90%    I&O's:    Intake/Output Summary (Last 24 hours) at 06/27/14 1019 Last data filed at 06/27/14 1000  Gross per 24 hour  Intake    430 ml  Output   2575 ml  Net  -2145 ml   TELEMETRY: Reviewed telemetry pt in sinus/sinus tachcyardia:  . antiseptic oral rinse  7 mL Mouth Rinse q12n4p  . aspirin EC  81 mg Oral Daily  . atorvastatin  40 mg Oral q1800  . bisoprolol  2.5 mg Oral Daily  . chlorhexidine  15 mL Mouth Rinse BID  . Chlorhexidine Gluconate Cloth  6 each Topical Q0600  . furosemide  40 mg Oral Daily  . heparin  5,000 Units Subcutaneous 3 times per day  . insulin aspart  0-20 Units Subcutaneous 6 times per day  . insulin glargine  20 Units Subcutaneous BID  . ipratropium-albuterol  3 mL Nebulization Q6H  . isosorbide mononitrate  30 mg Oral Daily  . levofloxacin (LEVAQUIN) IV  750 mg Intravenous Q48H  . losartan  50 mg Oral Daily  . methylPREDNISolone (SOLU-MEDROL) injection  60 mg Intravenous Q12H  . mupirocin ointment  1 application Nasal BID    PHYSICAL EXAM General: Awake. Sitting in chair Head: Eyes PERRLA, No xanthomas.   Normal cephalic and atramatic  Lungs:  Decreased BS throughout with no crackles or wheezes Heart:  Distant  Tachy regular S1 S2 Pulses are 2+ & equal. Abdomen: Bowel sounds are positive, abdomen soft and non-tender without masses Extremities:   No clubbing, cyanosis or edema.  DP +1  Neuro: Alert and oriented X  3. Psych:  Good affect, responds appropriately   LABS: Basic Metabolic Panel:  Recent Labs  04/54/09 0320 06/27/14 0351  NA 142 153*  K 4.1 4.0  CL 98 100  CO2 31 39*  GLUCOSE 372* 233*  BUN 52* 54*  CREATININE 1.48* 1.44*  CALCIUM 8.8 9.3  MG 2.3  --   PHOS 2.8  --    Liver Function Tests: No results for input(s): AST, ALT, ALKPHOS, BILITOT, PROT, ALBUMIN in the last 72 hours. No results for input(s): LIPASE, AMYLASE in the last 72 hours. CBC:  Recent Labs  06/26/14 0746 06/27/14 0351  WBC 12.6* 14.5*  HGB 13.7 14.6  HCT 43.2 47.2*  MCV 96.2 98.1  PLT 167 192   Cardiac Enzymes:  Recent Labs  06/24/14 1440 06/24/14 1951  TROPONINI 5.26* 4.63*   BNP: Invalid input(s): POCBNP D-Dimer: No results for input(s): DDIMER in the last 72 hours. Hemoglobin A1C: No results for input(s): HGBA1C in the last 72 hours. Fasting Lipid Panel: No results for input(s): CHOL, HDL, LDLCALC, TRIG, CHOLHDL, LDLDIRECT in the last 72 hours. Thyroid Function Tests: No results for input(s): TSH, T4TOTAL, T3FREE, THYROIDAB in the last 72 hours.  Invalid input(s):  FREET3 Anemia Panel: No results for input(s): VITAMINB12, FOLATE, FERRITIN, TIBC, IRON, RETICCTPCT in the last 72 hours. Coag Panel:   Lab Results  Component Value Date   INR 0.96 06/25/2014   INR 0.94 06/25/2014   INR 0.90 07/04/2012    RADIOLOGY: Dg Chest 2 View  06/23/2014   CLINICAL DATA:  74 year old female with a history of shortness of breath for 3 days  EXAM: CHEST - 2 VIEW  COMPARISON:  Plain film 08/29/2013, 06/22/2014  FINDINGS: Cardiomediastinal silhouette unchanged in size and contour. Coronary stents in place.  Similar appearance of mild interstitial and airspace opacities.  No pleural effusion or pneumothorax.  No displaced fracture.  Unremarkable appearance of the upper abdomen.  IMPRESSION: Mixed interstitial and airspace opacities are nonspecific, potentially representing edema, atelectasis, and/ or  atypical infection/consolidation.  Coronary artery disease.  Signed,  Yvone NeuJaime S. Loreta AveWagner, DO  [facility]  [facility]   Electronically Signed   By: Gilmer MorJaime  Wagner D.O.   On: 06/23/2014 22:03   Dg Chest Port 1 View  06/27/2014   CLINICAL DATA:  Acute respiratory failure  EXAM: PORTABLE CHEST - 1 VIEW  COMPARISON:  06/25/2014  FINDINGS: Endotracheal tube has been removed. Heart size upper limits of normal. Patchy reticulonodular airspace opacities bilaterally are stable. No new focal airspace disease. Minimal eventration of the right hemidiaphragm reidentified, mimicking subdiaphragmatic free air. The patient has no recent history of abdominal surgery.  IMPRESSION: Stable patchy nonspecific reticulonodular airspace opacities. No new acute finding. Maintained aeration after extubation.   Electronically Signed   By: Christiana PellantGretchen  Green M.D.   On: 06/27/2014 09:28   Dg Chest Port 1 View  06/25/2014   CLINICAL DATA:  Repositioning of endotracheal tube.  EXAM: PORTABLE CHEST - 1 VIEW  COMPARISON:  Single view of the chest earlier this same day.  FINDINGS: Endotracheal tube is in place with the tip in good position 3.4 cm above the carina. NG tube courses into the stomach and below the inferior margin of film. The chest is hyperexpanded compatible with emphysema. Mild basilar atelectasis is noted. Heart size is upper normal.  IMPRESSION: ET tube projects in good position.  Pulmonary hyperexpansion compatible with emphysema with mild bi basilar atelectasis noted.   Electronically Signed   By: Drusilla Kannerhomas  Dalessio M.D.   On: 06/25/2014 13:29   Dg Chest Portable 1 View  06/25/2014   CLINICAL DATA:  Respiratory failure  EXAM: PORTABLE CHEST - 1 VIEW  COMPARISON:  06/24/2014  FINDINGS: Endotracheal tube is 7 cm above the carina. NG tube enters the stomach. Patchy lingular and right basilar airspace opacity, similar prior study. Increasing patchy right upper lobe opacity. No effusions.  No acute bony abnormality.  IMPRESSION: Patchy bibasilar opacities are stable. Slight increased right upper lobe airspace opacity.  Endotracheal tube retracted, now 7 cm above the carina.   Electronically Signed   By: Charlett NoseKevin  Dover M.D.   On: 06/25/2014 04:42   Dg Chest Port 1 View  06/24/2014   CLINICAL DATA:  Endotracheal and enteric tube placement.  EXAM: PORTABLE CHEST - 1 VIEW  COMPARISON:  Earlier same day ; 06/23/2014 ; 08/29/2013  FINDINGS: Grossly unchanged borderline enlarged cardiac silhouette and mediastinal contours with atherosclerotic plaque within the thoracic aorta. Endotracheal tube overlies the tracheal air column with tip superior to the carina. Enteric tube tip terminates below the left hemidiaphragm. No pneumothorax. Grossly unchanged bibasilar heterogeneous opacities. No new focal airspace opacities. No pleural effusion or pneumothorax. No  evidence of edema. No acute osseus abnormalities.  IMPRESSION: 1. Appropriately positioned support apparatus as above. No pneumothorax. 2. Similar findings of lung hyperexpansion and bibasilar opacities, left greater than right, likely atelectasis or scar.   Electronically Signed   By: Simonne Come M.D.   On: 06/24/2014 18:12   Dg Chest Port 1 View  06/24/2014   CLINICAL DATA:  Shortness of breath.  EXAM: PORTABLE CHEST - 1 VIEW  COMPARISON:  June 24, 2014.  FINDINGS: Stable cardiomediastinal silhouette. No pneumothorax or pleural effusion is noted. Stable linear density is noted in lingular region consistent with subsegmental atelectasis. Possible cavitary abnormality is noted in right midlung. No significant abnormality seen in the skeleton.  IMPRESSION: Stable linear density seen in lingular region consistent with subsegmental atelectasis. Possible cavitary abnormality seen right mid lung. CT scan of the chest is recommended for further evaluation.   Electronically Signed   By: Lupita Raider, M.D.   On: 06/24/2014 12:27   Dg Chest Port 1  View  06/24/2014   CLINICAL DATA:  Acute onset shortness of breath and weakness  EXAM: PORTABLE CHEST - 1 VIEW  COMPARISON:  06/23/2014  FINDINGS: Stable cardiomegaly with improving bibasilar atelectasis. No current pneumonia, edema, collapse or consolidation. No effusion or pneumothorax. Trachea midline. Degenerative changes of the spine and shoulders.  IMPRESSION: cardiomegaly with improving bibasilar atelectasis.   Electronically Signed   By: Judie Petit.  Shick M.D.   On: 06/24/2014 08:40   ASSESSMENT AND PLAN:  Principal Problem:  1. Acute on chronic respiratory failure -  This appears to be mainly COPD exacerbation complicated by acute CHF  2. NSTEMI - Troponin at [facility] peak 20.81. 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 Acute systolic HF - Echo 4/22 - EF 30-35%. 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 COPD 5. Essential hypertension, benign - BP improved. Off IV NTG 6. Obesity 7. DM 8. CKD, stage III - stable 9. Hypernatremia  Now extubated. Lasix switched to po. Hard to assess JVP given size but sodium is up this am so likely dry. Will hold lasix for now. Continue losartan and bisoprolol. She has very severe underlying COPD.   Arvilla Meres, MD  06/27/2014  10:19 AM

## 2014-06-27 NOTE — Progress Notes (Addendum)
PULMONARY / CRITICAL CARE MEDICINE   Name: [name] MRN: [id number] DOB: April 08, 1940    ADMISSION DATE:  06/23/2014 CONSULTATION DATE:  4/21  REFERRING MD :  [facility]   CHIEF COMPLAINT:  Respiratory failure    BRIEF SUMMARY: 74 yo female former smoker with multiple medical problems including O2 dependent COPD, HTN, CHF, CAD, OSA (noncompliant with CPAP), and DM who was initially admitted to [facility] 4/20 with acute on chronic respiratory failure.  She was placed on bipap and treated with BD, steroids, NTG and diuresis.  She improved initially but troponins were elevated (0.38 -> 15.13 -> 20.81 -> 11.97) and she was tx 4/20 [facility] for [facility]ology eval.  On 4/21 she developed acutely worsening SOB, HTN with SBP 190's and was placed back on bipap and PCCM consulted.   STUDIES:  4/22  2D echo >> mild LVH, systolic mod-severely reduced, EF 30-35%, trivial pericardial effusion, anterior/apical WMA 4/22  CXR >> ETT 7cm from carina, advanced 4cm, repeat CXR with ETT in correct position 4/23 CXR improvement of mild pulmonary edema   SIGNIFICANT EVENTS: 4/21  Emergently intubated due to acute respiratory failure with hypoxemia and hypercapnic 4/22  LHC >> stable 2 vessel CAD, rec's for medical management. 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 4/23 Extubated to BiPAP   SUBJECTIVE:  Hypertensive this am but asymptomatic Tolerating nasal cannula O2 supplementation with no increased SOB Denies productive cough, chest pain Mild tachycardia, mild drop in BP, and dizziness with ambulation this am   VITAL SIGNS: Temp:  [96 F (35.6 C)-97.7 F (36.5 C)] 97.7 F (36.5 C) (04/24 0800) Pulse Rate:  [83-112] 109 (04/24 0700) Resp:  [16-24] 22 (04/24 0800) BP: (130-185)/(45-92) 171/72 mmHg (04/24 0800) SpO2:  [95 %-100 %] 96 % (04/24 0800) FiO2 (%):  [40 %] 40 % (04/24 0400) Weight:  [215 lb 6.2 oz (97.7 kg)] 215 lb 6.2 oz (97.7 kg) (04/24 0540)    INTAKE / OUTPUT:  Intake/Output Summary (Last 24 hours)  at 06/27/14 0850 Last data filed at 06/27/14 0800  Gross per 24 hour  Intake     30 ml  Output   2475 ml  Net  -2445 ml    PHYSICAL EXAMINATION: General:  Obese, chronically ill appearing white female in NAD Neuro:  Alert, following commands, [place] in place HEENT:  poor dentition, no JVD  Cardiovascular:  s1s2 distant, mild tachy, regular Lungs: lungs bilaterally diminished, clear to auscultation bilaterally Abdomen:  Obese, soft, non tender  Musculoskeletal:  Warm and dry, no LE edema LABS:  CBC  Recent Labs Lab 06/25/14 0320 06/26/14 0746 06/27/14 0351  WBC 14.0* 12.6* 14.5*  HGB 12.7 13.7 14.6  HCT 40.2 43.2 47.2*  PLT 206 167 192   Coag's  Recent Labs Lab 06/25/14 1232 06/25/14 2137  APTT  --  25  INR 0.94 0.96   BMET  Recent Labs Lab 06/24/14 0935 06/25/14 0320 06/27/14 0351  NA 138 142 153*  K 4.6 4.1 4.0  CL 95* 98 100  CO2 28 31 39*  BUN 48* 52* 54*  CREATININE 1.65* 1.48* 1.44*  GLUCOSE 453* 372* 233*   Electrolytes  Recent Labs Lab 06/24/14 0935 06/25/14 0320 06/27/14 0351  CALCIUM 9.1 8.8 9.3  MG  --  2.3  --   PHOS  --  2.8  --    Sepsis Markers  Recent Labs Lab 06/24/14 0935 06/24/14 1000 06/25/14 0320 06/26/14 0746  LATICACIDVEN  --  1.8  --   --  PROCALCITON 2.25  --  1.08 0.30   ABG  Recent Labs Lab 06/24/14 2126 06/25/14 0333 06/25/14 1443  PHART 7.404 7.397 7.299*  PCO2ART 52.2* 51.5* 34.1*  PO2ART 93.0 109.0* 84.0   Liver Enzymes  Recent Labs Lab 06/24/14 0935  AST 39*  ALT 34  ALKPHOS 61  BILITOT 0.5  ALBUMIN 3.4*   Cardiac Enzymes  Recent Labs Lab 06/24/14 0935 06/24/14 1440 06/24/14 1951  TROPONINI 3.46* 5.26* 4.63*   Glucose  Recent Labs Lab 06/26/14 1244 06/26/14 1549 06/26/14 2055 06/26/14 2333 06/27/14 0355 06/27/14 0758  GLUCAP 254* 211* 148* 139* 187* 230*    Imaging No results found.   ASSESSMENT / PLAN:  PULMONARY Acute on chronic respiratory failure -  multifactorial in setting acute on chronic sCHF, NSTEMI, OSA and underlying severe COPD. Extubated to BiPAP now tolerating [place] well.  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 P:  Cardiology following appreciate input Off nitro drip Cont imdur, ASA, statin, Cozar 50mg  Started on bisoprolol per cards, may need hydralazine PO if BP persistently elevated Hydralazine IV PRN Lopressor IV PRN Nitro SL tab PRN  RENAL AKI - Cr improved, good UOP with diuresis, possible cardiorenal syndrome Hyponatremia- resolved Hypernatremia - 2/2 steroid tx v overdiuresis  P:   BMET in am  Oral hydration --was NPO overnight Hold am Lasix  GASTROINTESTINAL Morbid Obesity  P:   PPI  Carb mod, HH diet  HEMATOLOGIC No active issue  P:  Heparin SQ for DVT proph F/u cbc   INFECTIOUS Leukocytosis - mild.   AECOPD  P:   Levaquin 4/20>>  ENDOCRINE DM - poorly controlled  P:   Cont lantus, SSI  Carb mod/HH diet   NEUROLOGIC Anxiety  P:   Cont PRN xanax  Avoid oversedation    FAMILY  - Updates:  Daughter and son updated at bedside on 4/24  Summary: 74 yr old woman with PMH of sCHF, COPD, presenting with acute respiratory failure requiring intubation, with suspected Takotsubo per echo and cardiac cath. Improving with diuresis, IV steroids, and Abx. Extubated on 4/23, maintaining O2 sats with [place], BP mildly elevated in setting of high dose steroids. Mildly tachy and dizzy this am, Lasix held, resumed diet and oral hydration. If remains stable may be transferred to SDU  on 4/25.   Ky BarbanKennerly, Leeam Cedrone D, MD IMTS, PGY3  06/27/2014  8:50 AM

## 2014-06-27 NOTE — Progress Notes (Signed)
PT Cancellation Note  Patient Details Name: [name] F Fukuhara MRN: [id number] DOB: 04/30/1940   Cancelled Treatment:    Reason Eval/Treat Not Completed: Medical issues which prohibited therapy (pt just transferred OOB with RN, fatigued with decline for further mobility and HR up to 147 with transfer. Pt declines and HR limits further activity at this time. Will reattempt if time allows)   Toney Sangabor, Earnie Rockhold [facility] 06/27/2014, 9:21 AM Delaney MeigsMaija Tabor Asiana Benninger, PT [phone]

## 2014-06-28 ENCOUNTER — Telehealth: Payer: Self-pay | Admitting: Family Medicine

## 2014-06-28 DIAGNOSIS — E118 Type 2 diabetes mellitus with unspecified complications: Secondary | ICD-10-CM

## 2014-06-28 LAB — CBC
HCT: 45.2 % (ref 36.0–46.0)
Hemoglobin: 14.4 g/dL (ref 12.0–15.0)
MCH: 30.5 pg (ref 26.0–34.0)
MCHC: 31.9 g/dL (ref 30.0–36.0)
MCV: 95.8 fL (ref 78.0–100.0)
PLATELETS: 180 10*3/uL (ref 150–400)
RBC: 4.72 MIL/uL (ref 3.87–5.11)
RDW: 12.1 % (ref 11.5–15.5)
WBC: 13.6 10*3/uL — AB (ref 4.0–10.5)

## 2014-06-28 LAB — COMPREHENSIVE METABOLIC PANEL
ALT: 42 U/L — ABNORMAL HIGH (ref 0–35)
AST: 31 U/L (ref 0–37)
Albumin: 2.9 g/dL — ABNORMAL LOW (ref 3.5–5.2)
Alkaline Phosphatase: 50 U/L (ref 39–117)
Anion gap: 11 (ref 5–15)
BILIRUBIN TOTAL: 1.1 mg/dL (ref 0.3–1.2)
BUN: 45 mg/dL — ABNORMAL HIGH (ref 6–23)
CALCIUM: 9.1 mg/dL (ref 8.4–10.5)
CHLORIDE: 94 mmol/L — AB (ref 96–112)
CO2: 36 mmol/L — AB (ref 19–32)
CREATININE: 1.22 mg/dL — AB (ref 0.50–1.10)
GFR calc Af Amer: 50 mL/min — ABNORMAL LOW (ref >90)
GFR, EST NON AFRICAN AMERICAN: 43 mL/min — AB (ref >90)
GLUCOSE: 106 mg/dL — AB (ref 70–99)
POTASSIUM: 3.3 mmol/L — AB (ref 3.5–5.1)
Sodium: 141 mmol/L (ref 135–145)
TOTAL PROTEIN: 5.8 g/dL — AB (ref 6.0–8.3)

## 2014-06-28 LAB — GLUCOSE, CAPILLARY
GLUCOSE-CAPILLARY: 273 mg/dL — AB (ref 70–99)
GLUCOSE-CAPILLARY: 159 mg/dL — AB (ref 70–99)
Glucose-Capillary: 101 mg/dL — ABNORMAL HIGH (ref 70–99)
Glucose-Capillary: 257 mg/dL — ABNORMAL HIGH (ref 70–99)

## 2014-06-28 MED ORDER — POTASSIUM CHLORIDE CRYS ER 20 MEQ PO TBCR
40.0000 meq | EXTENDED_RELEASE_TABLET | Freq: Once | ORAL | Status: AC
  Administered 2014-06-28: 40 meq via ORAL
  Filled 2014-06-28: qty 2

## 2014-06-28 MED ORDER — POTASSIUM CHLORIDE CRYS ER 20 MEQ PO TBCR
20.0000 meq | EXTENDED_RELEASE_TABLET | Freq: Once | ORAL | Status: AC
  Administered 2014-06-28: 20 meq via ORAL
  Filled 2014-06-28: qty 1

## 2014-06-28 MED ORDER — INSULIN ASPART 100 UNIT/ML ~~LOC~~ SOLN
5.0000 [IU] | Freq: Three times a day (TID) | SUBCUTANEOUS | Status: DC
  Administered 2014-06-28 – 2014-06-29 (×3): 5 [IU] via SUBCUTANEOUS

## 2014-06-28 MED ORDER — INSULIN ASPART 100 UNIT/ML ~~LOC~~ SOLN: 3.0000 [IU] | Freq: Three times a day (TID) | SUBCUTANEOUS | Status: DC

## 2014-06-28 NOTE — Clinical Social Work Placement (Signed)
   CLINICAL SOCIAL WORK PLACEMENT  NOTE  Date:  06/28/2014  Patient Details  Name: Harold Hedgera F Tagliaferri MRN: [id number] Date of Birth: 03/04/1941  Clinical Social Work is seeking post-discharge placement for this patient at the Skilled  Nursing Facility level of care (*CSW will initial, date and re-position this form in  chart as items are completed):  Yes   Patient/family provided with [place] Clinical Social Work Department's list of facilities offering this level of care within the geographic area requested by the patient (or if unable, by the patient's family).  Yes   Patient/family informed of their freedom to choose among providers that offer the needed level of care, that participate in Medicare, Medicaid or managed care program needed by the patient, have an available bed and are willing to accept the patient.  Yes   Patient/family informed of [facility]'s ownership interest in [facility] and [facility], as well as of the fact that they are under no obligation to receive care at these facilities.  PASRR submitted to EDS on 06/28/14     PASRR number received on 06/28/14     Existing PASRR number confirmed on       FL2 transmitted to all facilities in geographic area requested by pt/family on 06/28/14     FL2 transmitted to all facilities within larger geographic area on       Patient informed that his/her managed care company has contracts with or will negotiate with certain facilities, including the following:  Other - please specify in the comment section below: ([facility])         Patient/family informed of bed offers received.  Patient chooses bed at       Physician recommends and patient chooses bed at      Patient to be transferred to   on  .  Patient to be transferred to facility by       Patient family notified on   of transfer.  Name of family member notified:        PHYSICIAN Please prepare priority  discharge summary, including medications, Please sign FL2, Please prepare prescriptions     Additional Comment:    _______________________________________________ Darylene Pricerowder, Basil Buffin T, LCSW 06/28/2014, 5:38 PM

## 2014-06-28 NOTE — Progress Notes (Signed)
CSW spoke with patient and her daughter Andrey CampanileDebbie Chilton to discuss current bed offers [facility].  Patient and daughter question "why can't I go home?"  CSW discussed that SNF rehab is the patient's choice and she needed to know her resources.  Patient and family to talk tonight and decide if they will seek SNF or go home.  CSW will follow up in the morning.  Lorri Frederickonna T. Jaci LazierCrowder, [place]CSW [phone]

## 2014-06-28 NOTE — Progress Notes (Signed)
BRIEF SUMMARY: 74 yo female former smoker with multiple medical problems including O2 dependent COPD, HTN, CHF, CAD, OSA (noncompliant with CPAP), and DM who was [facility] 4/20 with acute on chronic respiratory failure. She was placed on bipap and treated with BD, steroids, NTG and diuresis. She improved initially but troponins were elevated (0.38 -> 15.13 -> 20.81 -> 11.97) and she [facility]. On 4/21 she developed acutely worsening SOB, HTN with SBP 190's and was placed back on bipap and PCCM consulted. Then intubated. By 4/23 extubated.   Subjective: No chest pain no SOB, but has to stop and rest after going from lying to standing.  Yesterday HR up to 130 just sitting in the chair.  Objective: Vital signs in last 24 hours: Temp:  [97.1 F (36.2 C)-98.2 F (36.8 C)] 98.2 F (36.8 C) (04/25 0534) Pulse Rate:  [83-108] 83 (04/25 0534) Resp:  [17-22] 18 (04/25 0534) BP: (112-169)/(43-64) 130/53 mmHg (04/25 0534) SpO2:  [95 %-100 %] 100 % (04/25 0534) Weight:  [216 lb 2.9 oz (98.06 kg)-223 lb 12.3 oz (101.5 kg)] 223 lb 12.3 oz (101.5 kg) (04/25 0500) Weight change: 12.7 oz (0.36 kg) Last BM Date: 06/27/14 Intake/Output from previous day: +230  (since admit -5882) wt 223.12 down from 230 on admit 04/24 0701 - 04/25 0700 In: 780 [P.O.:780] Out: 550 [Urine:550] Intake/Output this shift: Total I/O In: -  Out: 150 [Urine:150]  PE: General:Pleasant affect, NAD Skin:Warm and dry, brisk capillary refill HEENT:normocephalic, sclera clear, mucus membranes moist Heart:S1S2 RRR without murmur, gallup, rub or click Lungs:clear to dimininshed without rales, rhonchi, or wheezes WUJ:WJXBAbd:soft, non tender, + BS, do not palpate liver spleen or masses Ext:no lower ext edema,  2+ radial pulses Neuro:alert and oriented X 3, MAE, follows commands, + facial symmetry Tele: SR was with Stach on admit now with freq PACs and some junct.  Runs.     Lab Results:  Recent Labs  06/27/14 0351 06/28/14 0452  WBC 14.5* 13.6*  HGB 14.6 14.4  HCT 47.2* 45.2  PLT 192 180   BMET  Recent Labs  06/27/14 0351 06/28/14 0452  NA 153* 141  K 4.0 3.3*  CL 100 94*  CO2 39* 36*  GLUCOSE 233* 106*  BUN 54* 45*  CREATININE 1.44* 1.22*  CALCIUM 9.3 9.1   No results for input(s): TROPONINI in the last 72 hours.  Invalid input(s): CK, MB  Lab Results  Component Value Date   CHOL 167 09/21/2013   HDL 36.20* 09/21/2013   LDLCALC 56 09/21/2013   LDLDIRECT 73.9 07/16/2012   TRIG 376.0* 09/21/2013   CHOLHDL 5 09/21/2013   Lab Results  Component Value Date   HGBA1C 7.9* 12/21/2013     Lab Results  Component Value Date   TSH 2.09 07/16/2012    Hepatic Function Panel  Recent Labs  06/28/14 0452  PROT 5.8*  ALBUMIN 2.9*  AST 31  ALT 42*  ALKPHOS 50  BILITOT 1.1   No results for input(s): CHOL in the last 72 hours. No results for input(s): PROTIME in the last 72 hours.     Studies/Results: Dg Chest Port 1 View  06/27/2014   CLINICAL DATA:  Acute respiratory failure  EXAM: PORTABLE CHEST - 1 VIEW  COMPARISON:  06/25/2014  FINDINGS: Endotracheal tube has been removed. Heart size upper limits of normal. Patchy reticulonodular airspace opacities bilaterally are stable. No new focal airspace disease. Minimal  eventration of the right hemidiaphragm reidentified, mimicking subdiaphragmatic free air. The patient has no recent history of abdominal surgery.  IMPRESSION: Stable patchy nonspecific reticulonodular airspace opacities. No new acute finding. Maintained aeration after extubation.   Electronically Signed   By: Christiana Pellant M.D.   On: 06/27/2014 09:28    Medications: I have reviewed the patient's current medications. Scheduled Meds: . antiseptic oral rinse  7 mL Mouth Rinse q12n4p  . aspirin EC  81 mg Oral Daily  . atorvastatin  40 mg Oral q1800  . bisoprolol  2.5 mg Oral Daily  . budesonide  0.25 mg  Nebulization BID  . chlorhexidine  15 mL Mouth Rinse BID  . Chlorhexidine Gluconate Cloth  6 each Topical Q0600  . enoxaparin (LOVENOX) injection  50 mg Subcutaneous Q24H  . insulin aspart  0-20 Units Subcutaneous TID WC  . insulin glargine  20 Units Subcutaneous BID  . ipratropium-albuterol  3 mL Nebulization QID  . isosorbide mononitrate  30 mg Oral Daily  . levofloxacin  500 mg Oral Daily  . losartan  50 mg Oral Daily  . mupirocin ointment  1 application Nasal BID  . predniSONE  40 mg Oral Q breakfast   Continuous Infusions:  PRN Meds:.albuterol, ALPRAZolam, guaiFENesin-dextromethorphan, hydrALAZINE, metoprolol, nitroGLYCERIN Assessment/Plan: 1. Acute on chronic respiratory failure - This appears to be mainly COPD exacerbation complicated by acute CHF --now on po lasix, (back to chronic SOB)  Tachycardia with exertion  2. NSTEMI- no further pain - Troponin at [facility] peak 20.81. 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 Acute systolic HF - Echo 4/22 - EF 30-35%. 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 COPD  5. Essential hypertension, benign - BP improved. Now stable 112/54 to 130/53  6. Obesity 7. DM-per IM 8. CKD, stage III - stable 9. Hypernatremia- resolved   10. Tachycardic yesterday to 147 unable to ambulate with PT  11. Hypokalemia add Kdur 20 meq today      LOS: 5 days   Time spent with pt. :15 minutes. [facility] R  Nurse Practitioner Certified Pager [phone] or after 5pm and on weekends call [phone] 06/28/2014, 9:25 AM  Patient seen, examined. Available data reviewed. Agree with findings, assessment, and plan as outlined by Nada Boozer, NP. The patient is independently interviewed and examined. There are family members at the bedside. Her exam is impressive for minimal air movement throughout her lung fields. Heart sounds are distant. I have reviewed telemetry which shows sinus rhythm with a heart rate in the 90s this morning. Would continue her current  medical program. Heart catheterization results reviewed (I performed her heart catheterization last week). 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 Hopeful that her cardiac function will improve, but agree with continuing oral diuretics to prevent further exacerbation of congestive heart failure. Patient's cardiac medications were reviewed and would continue the same.  Tonny Bollman, M.D. 06/28/2014 12:19 PM

## 2014-06-28 NOTE — Evaluation (Signed)
Physical Therapy Evaluation Patient Details Name: [name] MRN: [id number] DOB: 03/23/1940 Today's Date: 06/28/2014   History of Present Illness  74 yo female former smoker with multiple medical problems including O2 dependent COPD, HTN, CHF, CAD, OSA , and DM who was [facility] with acute on chronic respiratory failure. She improved initially but troponins were elevated and she was [facility]. On 4/21 she developed acutely worsening SOB ETT 4/21-4/23, NSTEMI  Clinical Impression  Pt pleasant sitting EOB on arrival and feeling that her breathing and function are better today. HR 111 at rest with rise to 156 with limited ambulation to door and return to 117 at rest. 94% on 4L with activity and reports 2/4 dyspnea with diaphoresis with ambulation. Pt does not have 24hr care and lives alone. Pt with decreased strength, mobility, gait and transfers who will benefit from acute therapy to maximize mobility, function and independence to decrease burden of care.     Follow Up Recommendations SNF;Supervision/Assistance - 24 hour    Equipment Recommendations  None recommended by PT    Recommendations for Other Services       Precautions / Restrictions Precautions Precautions: Fall Precaution Comments: watch HR      Mobility  Bed Mobility Overal bed mobility: Modified Independent                Transfers Overall transfer level: Needs assistance   Transfers: Sit to/from Stand Sit to Stand: Min guard         General transfer comment: cues for hand placement and safety  Ambulation/Gait Ambulation/Gait assistance: Min guard Ambulation Distance (Feet): 30 Feet Assistive device: Rolling walker (2 wheeled) Gait Pattern/deviations: Step-through pattern;Decreased stride length;Trunk flexed   Gait velocity interpretation: Below normal speed for age/gender General Gait Details: cues for posture, position in RW and  breathing technique  Stairs            Wheelchair Mobility    Modified Rankin (Stroke Patients Only)       Balance Overall balance assessment: Needs assistance   Sitting balance-Leahy Scale: Good       Standing balance-Leahy Scale: Poor                               Pertinent Vitals/Pain Pain Assessment: No/denies pain    Home Living Family/patient expects to be discharged to:: Private residence Living Arrangements: Alone Available Help at Discharge: Family;Available PRN/intermittently Type of Home: Apartment Home Access: Level entry     Home Layout: One level Home Equipment: Walker - 4 wheels;Bedside commode      Prior Function Level of Independence: Independent with assistive device(s)         Comments: pt has assist for housework and groceries from family. She performs all other ADLs for herself, walks with a rollator and has to take several seated rests from the car into her aparment     Hand Dominance        Extremity/Trunk Assessment   Upper Extremity Assessment: Generalized weakness           Lower Extremity Assessment: Generalized weakness      Cervical / Trunk Assessment: Kyphotic  Communication   Communication: No difficulties  Cognition Arousal/Alertness: Awake/alert Behavior During Therapy: WFL for tasks assessed/performed Overall Cognitive Status: Within Functional Limits for tasks assessed  General Comments      Exercises General Exercises - Lower Extremity Long Arc Quad: AROM;Seated;Both;10 reps Hip Flexion/Marching: AROM;Both;10 reps;Seated      Assessment/Plan    PT Assessment Patient needs continued PT services  PT Diagnosis Difficulty walking;Generalized weakness   PT Problem List Decreased strength;Decreased activity tolerance;Decreased balance;Decreased mobility;Cardiopulmonary status limiting activity;Decreased knowledge of use of DME  PT Treatment Interventions  Gait training;DME instruction;Functional mobility training;Therapeutic activities;Therapeutic exercise;Patient/family education   PT Goals (Current goals can be found in the Care Plan section) Acute Rehab PT Goals Patient Stated Goal: be able to return home PT Goal Formulation: With patient Time For Goal Achievement: 07/12/14 Potential to Achieve Goals: Good    Frequency Min 3X/week   Barriers to discharge Decreased caregiver support      Co-evaluation               End of Session Equipment Utilized During Treatment: Oxygen Activity Tolerance: Patient tolerated treatment well Patient left: in chair;with call bell/phone within reach;with family/visitor present Nurse Communication: Mobility status         Time: 1030-1101 PT Time Calculation (min) (ACUTE ONLY): 31 min   Charges:   PT Evaluation $Initial PT Evaluation Tier I: 1 Procedure PT Treatments $Therapeutic Activity: 8-22 mins   PT G CodesDelorse Lek 06/28/2014, 1:39 PM Delaney Meigs, PT [phone]

## 2014-06-28 NOTE — Progress Notes (Addendum)
[facility] PROGRESS NOTE  [name] ZOX:[id number] DOB: 07/03/1940 DOA: 06/23/2014 PCP: Jeoffrey MassedMCGOWEN,PHILIP H, MD  Brief Margaret GottronSummary  74 yo female former smoker with multiple medical problems including O2 dependent COPD (2L), HTN, CHF, CAD, OSA (noncompliant with CPAP), and DM type 2 who was [facility] with acute on chronic respiratory failure. She was placed on bipap and treated with BD, steroids, NTG and diuresis. She improved initially but troponins were elevated (0.38 -> 15.13 -> 20.81 -> 11.97) and she was [facility]. On 4/21 she developed acutely worsening SOB, HTN with SBP 190's and was placed back on bipap and PCCM consulted.  she required intubation on 4/21. She underwent cardiac catheterization on 4/22 by Dr. Excell Seltzerooper who found nonobstructive CAD.   Her echocardiogram demonstrated mild LVH, moderate severe reduction of left ventricular systolic function with an ejection fraction of 30-35% and severe hypokinesis of the mid apical inferior and apical myocardium. 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  She was also continued on treatment for COPD exacerbation.  She was extubated on 4/23.    Assessment/Plan  Acute on chronic respiratory failure due to acute on chronic sCHF, NSTEMI type 2, OSA and COPD exacerbation  -   Diuresis per cards as able, lasix 40 QD -   Ins and outs not strictly recorded, weight is up 3 kg since yesterday -   Continue to monitor ins and outs and daily weights carefully and if she continues to gain weight, we may need to increase her diuretics -   Transitioned to prednisone to complete a 12 day taper -   Completed 6 days of Levofloxacin, will discontinue -   Continue duo nebs -   Will need LABA/ICS/ACH at discharge along with SABA -   Continue CPAP daily at bedtime -   Appreciate cardiology and pulmonology assistance -  [facility] -  FU with Dr.  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 - peak troponin 20.8, trended down to 4.6,  -  Cont imdur, ASA, statin, BB, Cozaar 50mg ,  Probable cardiorenal syndrome, creatinine improving with diuresis -  Minimize nephrotoxins and renally dose medications  Morbid Obesity, healthy heart diet  Leukocytosis due to steroids, trending down   DM type 2 - poorly controlled  -  A1c -  AM CBG well controlled, meal-time are high -  Add aspart 5 units with meals  Anxiety, stable, cont PRN xanax   Hyponatremia due to heart failure, resolved Hypernatremia - due to free water restriction while intubated, resolved  Diet:   Diabetic, healthy heart Access:   PIV IVF:   off Proph:   Lovenox  Code Status:  Partial , just a CODE BLUE, note to CPR, yes intubation, gas BiPAP , yes ACLS medications, no  Cardioversion or defibrillation Family Communication:  Patient alone Disposition Plan:  To skilled nursing facility   Consultants:   cardiology   Pulmonary critical care  Procedures: 4/21 Emergently intubated due to acute respiratory failure with hypoxemia and hypercapnic 4/22 LHC >> stable 2 vessel CAD, rec's for medical management. Suspect Takotsubo 4/23 Extubated to BiPAP  Antibiotics:  levofloxacin from 4/20   HPI/Subjective:   patient states that she continues to feel [name] of breath but overall is improved. She continues to have considerable cough and wheeze.   She denies chest pains. She says she has a poor appetite.  Objective: Filed Vitals:   06/28/14 0534 06/28/14 0925 06/28/14 1017 06/28/14 1137  BP: 130/53  133/63   Pulse: 83 94 101 97  Temp: 98.2 F (36.8 C)  97.5 F (36.4 C)   TempSrc: Oral  Oral   Resp:  Height:      Weight:      SpO2: 100% 96% 93% 98%    Intake/Output Summary (Last 24 hours) at 06/28/14 1446 Last data filed at 06/28/14 1219  Gross per 24 hour  Intake    700 ml  Output    750 ml  Net    -50 ml   Filed Weights   06/27/14 0540 06/27/14 1601 06/28/14 0500  Weight: 97.7 kg (215 lb 6.2 oz) 98.06 kg (216 lb 2.9 oz) 101.5 kg (223 lb 12.3 oz)    Exam:   General:   Obese female,  Mild respiratory distress with SCM retractions, audible wheeze, forced expirations. She is able to complete a full sentence.  HEENT:  NCAT, MMM  Cardiovascular:  RRR, nl S1, S2 no mrg, 2+ pulses, warm extremities  Respiratory:   Very diminished bilateral breath sounds with  courserales at the bilateral bases  And rhonchi  Abdomen:   NABS, soft, NT/ND  MSK:   Normal tone and bulk, no LEE  Neuro:  Grossly intact  Data Reviewed: Basic Metabolic Panel:  Recent Labs Lab 06/23/14 2044 06/24/14 0935 06/25/14 0320 06/27/14 0351 06/28/14 0452  NA 133* 138 142 153* 141  K 4.2 4.6 4.1 4.0 3.3*  CL 91* 95* 98 100 94*  CO2 32 28 31 39* 36*  GLUCOSE 380* 453* 372* 233* 106*  BUN 47* 48* 52* 54* 45*  CREATININE 1.95* 1.65* 1.48* 1.44* 1.22*  CALCIUM 8.9 9.1 8.8 9.3 9.1  MG  --   --  2.3  --   --   PHOS  --   --  2.8  --   --    Liver Function Tests:  Recent Labs Lab 06/24/14 0935 06/28/14 0452  AST 39* 31  ALT 34 42*  ALKPHOS 61 50  BILITOT 0.5 1.1  PROT 6.4 5.8*  ALBUMIN 3.4* 2.9*   No results for input(s): LIPASE, AMYLASE in the last 168 hours. No results for input(s): AMMONIA in the last 168 hours. CBC:  Recent Labs Lab 06/23/14 2044  06/24/14 0935 06/25/14 0320 06/26/14 0746 06/27/14 0351 06/28/14 0452  WBC 17.6*  < > 15.5* 14.0* 12.6* 14.5* 13.6*  NEUTROABS 15.3*  --   --   --   --   --   --   HGB 13.4  < > 14.0 12.7 13.7 14.6 14.4  HCT 41.9  < > 43.2 40.2 43.2 47.2* 45.2  MCV 93.5  < > 93.5 94.8 96.2 98.1 95.8  PLT 234  < > 213 206 167 192 180   < > = values in this interval not displayed. Cardiac Enzymes:  Recent Labs Lab 06/23/14 2044 06/24/14 0401 06/24/14 0935 06/24/14 1440 06/24/14 1951  TROPONINI 9.89* 4.96* 3.46* 5.26* 4.63*   BNP (last 3 results)  Recent Labs  06/24/14 0935 06/25/14 0320 06/27/14 0351  BNP 250.9* 321.3* 189.3*    ProBNP (last 3 results) No results  for input(s): PROBNP in the last 8760 hours.  CBG:  Recent Labs Lab 06/27/14 1200 06/27/14 1823 06/27/14 2058 06/28/14 0550 06/28/14 1200  GLUCAP 392* 346* 258* 101* 273*    Recent Results (from the past 240 hour(s))  MRSA PCR Screening     Status: Abnormal   Collection Time: 06/23/14  5:23 PM  Result Value Ref Range Status   MRSA by PCR POSITIVE (A) NEGATIVE Final    Comment:        The GeneXpert MRSA Assay (FDA approved for NASAL specimens only), is one component of a comprehensive MRSA colonization surveillance program. It is not intended to diagnose MRSA infection nor to guide or monitor treatment for MRSA infections. RESULT CALLED TO, READ BACK BY AND VERIFIED WITH: Earmon Phoenix RN 2145 06/23/14 A BROWNING   Clostridium Difficile by PCR     Status: None   Collection Time: 06/27/14  5:59 PM  Result Value Ref Range Status   C difficile by pcr NEGATIVE NEGATIVE Final     Studies: Dg Chest Port 1 View  06/27/2014   CLINICAL DATA:  Acute respiratory failure  EXAM: PORTABLE CHEST - 1 VIEW  COMPARISON:  06/25/2014  FINDINGS: Endotracheal tube has been removed. Heart size upper limits of normal. Patchy reticulonodular airspace opacities bilaterally are stable. No new focal airspace disease. Minimal eventration of the right hemidiaphragm reidentified, mimicking subdiaphragmatic free air. The patient has no recent history of abdominal surgery.  IMPRESSION: Stable patchy nonspecific reticulonodular airspace opacities. No new acute finding. Maintained aeration after extubation.   Electronically Signed   By: Christiana Pellant M.D.   On:  06/27/2014 09:28    Scheduled Meds: . antiseptic oral rinse  7 mL Mouth Rinse q12n4p  . aspirin EC  81 mg Oral Daily  . atorvastatin  40 mg Oral q1800  . bisoprolol  2.5 mg Oral Daily  . budesonide  0.25 mg Nebulization BID  . chlorhexidine  15 mL Mouth Rinse BID  . Chlorhexidine Gluconate Cloth  6 each Topical Q0600  . enoxaparin (LOVENOX) injection  50 mg Subcutaneous Q24H  . insulin aspart  0-20 Units Subcutaneous TID WC  . insulin glargine  20 Units Subcutaneous BID  . ipratropium-albuterol  3 mL Nebulization QID  . isosorbide mononitrate  30 mg Oral Daily  . losartan  50 mg Oral Daily  . mupirocin ointment  1 application Nasal BID  . predniSONE  40 mg Oral Q breakfast   Continuous Infusions:   Principal Problem:   Acute on chronic respiratory failure Active Problems:   Obesity   Essential hypertension, benign   CORONARY ATHEROSCLEROSIS NATIVE CORONARY ARTERY   Cardiomyopathy, ischemic   Diabetes mellitus with complication   NSTEMI (non-ST elevated myocardial infarction)   Acute renal failure   COPD exacerbation   Leukocytosis   Acute respiratory failure    Time spent: 30 min    Rawn Quiroa, [facility]  [facility] Pager [phone]. If 7PM-7AM, please contact night-coverage at [email or web address], password [facility] 06/28/2014, 2:46 PM  LOS: 5 days

## 2014-06-28 NOTE — Telephone Encounter (Signed)
Patient dismissed from [facility] by Nicoletta BaPhilip McGowen, M.D. effective June 23, 2014. Dismissal letter sent out by certified / registered mail on June 28, 2014. [facility]

## 2014-06-28 NOTE — Progress Notes (Signed)
CARE MANAGEMENT NOTE 06/28/2014  Patient:  [name],[name]   Account Number:  [id number]  Date Initiated:  06/24/2014  Documentation initiated by:  Junius CreamerWELL,DEBBIE  Subjective/Objective Assessment:   adm w inc troponins, on bipap     Action/Plan:   lives in indep apt. chart states has home o2, pcp dr Horatio Pelphilip mcgowan   Anticipated DC Date:  06/29/2014   Anticipated DC Plan:  HOME W HOME HEALTH SERVICES      DC Planning Services  CM consult      [facility]   Choice offered to / List presented to:  C-4 Adult Children           Status of service:  Completed, signed off Medicare Important Message given?  YES (If response is "NO", the following Medicare IM given date fields will be blank) Date Medicare IM given:  06/28/2014 Medicare IM given by:  [facility]Margaret Jewel Date Additional Medicare IM given:   Additional Medicare IM given by:    Discharge Disposition:  HOME W HOME HEALTH SERVICES  Per UR Regulation:  Reviewed for med. necessity/level of care/duration of stay  If discussed at Long Length of Stay Meetings, dates discussed:    Comments:  06/28/2014 1600 NCM spoke to pt and gave permission to speak to dtr, [name] [phone]. Dtr states between her other siblings and pt's adult grandchildren they will arrange 24 hour care for pt at home. She currently lives alone and wants to go home. Refusing SNF-rehab. She has RW, 3n1 and shower seat at home. Will need HHPT and RN at dc. Provided HHA list and offered choice. Dtr states she will look the list over and provide choice to NCM on tomorrow. Dtr will bring her portable oxygen tank at dc. Pt has oxygen with [employer]. [facility] Margaret DonningAlesia Laraine Samet RN CCM Case Mgmt phone [phone]  06/27/14 08:50 CM placed [facility] on Pt's Discharge instructions to secure a new PCP.  No other CM needs were communicated.  Freddy JakschSarah Jeffries, BSN, CM [phone].

## 2014-06-29 ENCOUNTER — Telehealth: Payer: Self-pay | Admitting: Cardiology

## 2014-06-29 DIAGNOSIS — I5181 Takotsubo syndrome: Secondary | ICD-10-CM

## 2014-06-29 LAB — BASIC METABOLIC PANEL
Anion gap: 11 (ref 5–15)
BUN: 39 mg/dL — ABNORMAL HIGH (ref 6–23)
CO2: 34 mmol/L — ABNORMAL HIGH (ref 19–32)
CREATININE: 1.24 mg/dL — AB (ref 0.50–1.10)
Calcium: 9.6 mg/dL (ref 8.4–10.5)
Chloride: 94 mmol/L — ABNORMAL LOW (ref 96–112)
GFR calc Af Amer: 49 mL/min — ABNORMAL LOW (ref >90)
GFR, EST NON AFRICAN AMERICAN: 42 mL/min — AB (ref >90)
GLUCOSE: 72 mg/dL (ref 70–99)
Potassium: 3.9 mmol/L (ref 3.5–5.1)
Sodium: 139 mmol/L (ref 135–145)

## 2014-06-29 LAB — GLUCOSE, CAPILLARY
GLUCOSE-CAPILLARY: 66 mg/dL — AB (ref 70–99)
Glucose-Capillary: 79 mg/dL (ref 70–99)
Glucose-Capillary: 167 mg/dL — ABNORMAL HIGH (ref 70–99)

## 2014-06-29 MED ORDER — DIPHENOXYLATE-ATROPINE 2.5-0.025 MG PO TABS: 1.0000 | ORAL_TABLET | Freq: Four times a day (QID) | ORAL | Status: DC | PRN

## 2014-06-29 MED ORDER — ATORVASTATIN CALCIUM 40 MG PO TABS: 40.0000 mg | ORAL_TABLET | Freq: Every day | ORAL | Status: DC

## 2014-06-29 MED ORDER — FUROSEMIDE 40 MG PO TABS
60.0000 mg | ORAL_TABLET | Freq: Every day | ORAL | Status: DC
Start: 2014-06-29 — End: 2014-07-06

## 2014-06-29 MED ORDER — BISOPROLOL FUMARATE 5 MG PO TABS: 2.5000 mg | ORAL_TABLET | Freq: Every day | ORAL | Status: DC

## 2014-06-29 MED ORDER — INSULIN GLARGINE 100 UNIT/ML ~~LOC~~ SOLN
10.0000 [IU] | Freq: Two times a day (BID) | SUBCUTANEOUS | Status: DC
  Administered 2014-06-29: 10 [IU] via SUBCUTANEOUS
  Filled 2014-06-29 (×2): qty 0.1

## 2014-06-29 MED ORDER — SALMETEROL XINAFOATE 50 MCG/DOSE IN AEPB: 1.0000 | INHALATION_SPRAY | Freq: Two times a day (BID) | RESPIRATORY_TRACT | Status: DC

## 2014-06-29 MED ORDER — MOMETASONE FURO-FORMOTEROL FUM 200-5 MCG/ACT IN AERO: 2.0000 | INHALATION_SPRAY | Freq: Two times a day (BID) | RESPIRATORY_TRACT | Status: DC

## 2014-06-29 MED ORDER — ALPRAZOLAM 0.5 MG PO TABS: 0.5000 mg | ORAL_TABLET | Freq: Three times a day (TID) | ORAL | Status: AC

## 2014-06-29 MED ORDER — PREDNISONE 10 MG PO TABS: ORAL_TABLET | ORAL | Status: DC

## 2014-06-29 MED ORDER — POTASSIUM CHLORIDE ER 10 MEQ PO TBCR: 10.0000 meq | EXTENDED_RELEASE_TABLET | Freq: Every day | ORAL | Status: DC

## 2014-06-29 MED ORDER — INSULIN ASPART PROT & ASPART (70-30 MIX) 100 UNIT/ML PEN: 25.0000 [IU] | PEN_INJECTOR | Freq: Two times a day (BID) | SUBCUTANEOUS | Status: AC

## 2014-06-29 NOTE — Progress Notes (Signed)
Patient Name: [name] Date of Encounter: 06/29/2014     Principal Problem:   Acute on chronic respiratory failure Active Problems:   Cardiomyopathy, ischemic   NSTEMI (non-ST elevated myocardial infarction)   COPD exacerbation   Acute respiratory failure   Obesity   Essential hypertension, benign   CORONARY ATHEROSCLEROSIS NATIVE CORONARY ARTERY   Diabetes mellitus with complication   Acute renal failure   Hyperlipidemia   Leukocytosis    SUBJECTIVE  No chest pain.  Slept well.  Still dyspneic with minimal activity.  CURRENT MEDS . antiseptic oral rinse  7 mL Mouth Rinse q12n4p  . aspirin EC  81 mg Oral Daily  . atorvastatin  40 mg Oral q1800  . bisoprolol  2.5 mg Oral Daily  . budesonide  0.25 mg Nebulization BID  . chlorhexidine  15 mL Mouth Rinse BID  . enoxaparin (LOVENOX) injection  50 mg Subcutaneous Q24H  . insulin aspart  0-20 Units Subcutaneous TID WC  . insulin aspart  5 Units Subcutaneous TID WC  . insulin glargine  10 Units Subcutaneous BID  . ipratropium-albuterol  3 mL Nebulization QID  . isosorbide mononitrate  30 mg Oral Daily  . losartan  50 mg Oral Daily  . mupirocin ointment  1 application Nasal BID  . predniSONE  40 mg Oral Q breakfast    OBJECTIVE  Filed Vitals:   06/28/14 1531 06/28/14 2100 06/29/14 0340 06/29/14 0710  BP:  110/56 106/55   Pulse: 96 86 85   Temp:  97.5 F (36.4 C) 97.7 F (36.5 C)   TempSrc:  Oral Oral   Resp: Height:      Weight:   221 lb 5.5 oz (100.4 kg) 218 lb 3.2 oz (98.975 kg)  SpO2: 98% 95% 97%     Intake/Output Summary (Last 24 hours) at 06/29/14 0813 Last data filed at 06/29/14 0710  Gross per 24 hour  Intake   1024 ml  Output    951 ml  Net     73 ml   Filed Weights   06/28/14 0500 06/29/14 0340 06/29/14 0710  Weight: 223 lb 12.3 oz (101.5 kg) 221 lb 5.5 oz (100.4 kg) 218 lb 3.2 oz (98.975 kg)    PHYSICAL EXAM  General: Pleasant, NAD. Neuro: Alert and oriented X 3. Moves all  extremities spontaneously. Psych: Normal affect. HEENT:  Normal  Neck: Supple without bruits.  Obese - difficult to gauge jvp. Lungs:  Resp regular and unlabored, diminished breath sounds bilat with diffuse, faint, exp wheezing. Heart: RRR, distant, no s3, s4, or murmurs. Abdomen: Soft, non-tender, non-distended, BS + x 4.  Extremities: No clubbing, cyanosis or edema. DP/PT/Radials 2+ and equal bilaterally.  R wrist cath site is ecchymotic but w/o bleeding/bruit/hematoma.  Accessory Clinical Findings  CBC  Recent Labs  06/27/14 0351 06/28/14 0452  WBC 14.5* 13.6*  HGB 14.6 14.4  HCT 47.2* 45.2  MCV 98.1 95.8  PLT 192 180   Basic Metabolic Panel  Recent Labs  06/28/14 0452 06/29/14 0552  NA 141 139  K 3.3* 3.9  CL 94* 94*  CO2 36* 34*  GLUCOSE 106* 72  BUN 45* 39*  CREATININE 1.22* 1.24*  CALCIUM 9.1 9.6   Liver Function Tests  Recent Labs  06/28/14 0452  AST 31  ALT 42*  ALKPHOS 50  BILITOT 1.1  PROT 5.8*  ALBUMIN 2.9*   TELE  RSR  ASSESSMENT AND PLAN  1.  Acute on  chronic respiratory failure/COPD/Acute systolic CHF:  Multifactorial in setting of AECOPD and acute CHF in setting of presumed takotsubo CM (EF 30-35% with severe apical HK).  Breath sounds remain diminished with intermittent exp wheezing.  Steroids/inhalers per IM.  Euvolemic on exam this AM.  Minus 5.7L this admission with reduction in weight from 230 to 218 lbs this AM.  Cont bb, arb, nitrate.  No BP room to add hydralazine/spiro.  Plan f/u echo in 40 days to reassess LV fxn - but pt would not be candidate for ICD in setting of severe O2 dependent COPD.  2.  NSTEMI:  Peak trop of 9.89.  No chest pain.  Cath revealed non-obstructive dzs.  Cont med Rx - asa, statin, bb.  Consider addition of plavix in setting of medically managed ACS.  3.  HTN:  Stable.  4.  HL:  LDL 56 last July.  Mild ALT elevation this admission.  Cont statin.  5.  CKD III: creat stable.  6.  DM II:  Per IM.    7.   Morbid Obesity:  Would benefit from cardiac rehab.  Signed, Nicolasa Duckinghristopher Berge NP  Patient seen, examined. Available data reviewed. Agree with findings, assessment, and plan as outlined by Ward Givenshris Berge, NP. The patient has diminished lung sounds bilaterally. Heart sounds are distant. There is no peripheral edema. I have reviewed her cardiac medications which include a beta blocker, ARB, and nitrate. Agree that there is no room for hydralazine or Aldactone in the setting of low blood pressure. Would continue current therapies without change. Overall she now appears stable from a cardiac perspective. Her prognosis is poor in this relates to her severe O2 dependent COPD and multiple comorbid medical conditions.  Tonny BollmanMichael Rennie Rouch, M.D. 06/29/2014 10:45 AM

## 2014-06-29 NOTE — Evaluation (Signed)
Occupational Therapy Evaluation Patient Details Name: [name] MRN: [id number] DOB: 01/06/1941 Today's Date: 06/29/2014    History of Present Illness 74 yo female former smoker with multiple medical problems including O2 dependent COPD, HTN, CHF, CAD, OSA , and DM who [facility] with acute on chronic respiratory failure. She improved initially but troponins were elevated and she was tx 4/[facility]. On 4/21 she developed acutely worsening SOB ETT 4/21-4/23, NSTEMI   Clinical Impression   Pt was able to care for herself at home and had assist of her family for IADL.  She presents with poor activity tolerance and impaired balance interfering with ability to perform ADL and ADL transfers.  She has agreed to ST rehab at a SNF and per MD who arrived during session, pt is ready for discharge today.  Will defer further OT to SNF.    Follow Up Recommendations  SNF;Supervision/Assistance - 24 hour    Equipment Recommendations       Recommendations for Other Services       Precautions / Restrictions Precautions Precautions: Fall Precaution Comments: watch HR Restrictions Weight Bearing Restrictions: No      Mobility Bed Mobility Overal bed mobility: Modified Independent                Transfers Overall transfer level: Needs assistance Equipment used: Rolling walker (2 wheeled) Transfers: Sit to/from Stand Sit to Stand: Min guard         General transfer comment: cues for hand placement and safety    Balance     Sitting balance-Leahy Scale: Good       Standing balance-Leahy Scale: Poor                              ADL Overall ADL's : Needs assistance/impaired Eating/Feeding: Independent;Sitting   Grooming: Wash/dry hands;Standing;Min guard   Upper Body Bathing: Standing;Set up   Lower Body Bathing: Sit to/from stand;Minimal assistance   Upper Body Dressing : Set up;Sitting   Lower  Body Dressing: Sit to/from stand;Minimal assistance   Toilet Transfer: Ambulation;RW;Min guard;BSC   Toileting- Clothing Manipulation and Hygiene: Sit to/from stand;Minimal assistance       Functional mobility during ADLs: Rolling walker;Min guard General ADL Comments: Pt fatigues easily with minimal exertion.      Vision     Perception     Praxis      Pertinent Vitals/Pain Pain Assessment: No/denies pain     Hand Dominance Right   Extremity/Trunk Assessment Upper Extremity Assessment Upper Extremity Assessment: RUE deficits/detail;LUE deficits/detail RUE Deficits / Details: generalized weakness, full AROM LUE Deficits / Details: FF of shoulder to 80 degrees actively, full PROM LUE Coordination: decreased gross motor   Lower Extremity Assessment Lower Extremity Assessment: Defer to PT evaluation   Cervical / Trunk Assessment Cervical / Trunk Assessment: Kyphotic   Communication Communication Communication: No difficulties   Cognition Arousal/Alertness: Awake/alert Behavior During Therapy: WFL for tasks assessed/performed Overall Cognitive Status: Within Functional Limits for tasks assessed                     General Comments       Exercises       Shoulder Instructions      Home Living Family/patient expects to be discharged to:: Skilled nursing facility  Home Equipment: Walker - 4 wheels;Bedside commode          Prior Functioning/Environment Level of Independence: Independent with assistive device(s)        Comments: pt has assist for housework and groceries from family. She performs all other ADLs for herself, walks with a rollator and has to take several seated rests from the car into her aparment    OT Diagnosis: Generalized weakness   OT Problem List:     OT Treatment/Interventions:      OT Goals(Current goals can be found in the care plan section) Acute Rehab OT Goals Patient Stated  Goal: be able to return home  OT Frequency:     Barriers to D/C:            Co-evaluation              End of Session    Activity Tolerance: Patient limited by fatigue Patient left: in bed;with call bell/phone within reach;with nursing/sitter in room;with family/visitor present   Time: 1155-1210 OT Time Calculation (min): 15 min Charges:  OT General Charges $OT Visit: 1 Procedure OT Evaluation $Initial OT Evaluation Tier I: 1 Procedure G-Codes:    Evern Bio 06/29/2014, 12:17 PM  [phone]

## 2014-06-29 NOTE — Progress Notes (Signed)
Pt has orders to be discharged. Discharge instructions given and pt has no additional questions at this time. Medication regimen reviewed and pt educated. Pt verbalized understanding and has no additional questions. Telemetry box removed. IV removed and site in good condition. [facility]. Pt stable and waiting for transportation.   Jilda PandaBethany Dacoda Spallone RN

## 2014-06-29 NOTE — Progress Notes (Signed)
Patient refused CPAP tonight.  Patient aware to ask RN to Respiratory if she changes her mind.

## 2014-06-29 NOTE — Progress Notes (Signed)
Inpatient Diabetes Program Recommendations  AACE/ADA: New Consensus Statement on Inpatient Glycemic Control (2013)  Target Ranges:  Prepandial:   less than 140 mg/dL      Peak postprandial:   less than 180 mg/dL (1-2 hours)      Critically ill patients:  140 - 180 mg/dL   Noted Lantus dose reduced and Novolog meal coverage added. Consider reducing Novolog correction scale to moderate scale. Thank you  Piedad ClimesGina Mubarak Bevens BSN, RN,CDE Inpatient Diabetes Coordinator [phone] (team pager)

## 2014-06-29 NOTE — Consult Note (Signed)
   [facility]   06/29/2014  [name] [name] 12/22/1940 [id number] Referral received from inpatient RNCM. [facility].  [facility] because she currently states she does not have a primary MD. Explained the [facility].   Daughter is at bedside and requested and  given a brochure with [facility] information and explained that she can also check the website for [facility] primary care providers. Patient is planning to discharge to a skilled facility for rehab.  For any additional questions or referrals please contact: Charlesetta ShanksVictoria Laster Appling, RN BSN CCM [facility] Liaison  [phone] business mobile phone

## 2014-06-29 NOTE — Discharge Summary (Addendum)
Physician Discharge Summary  [name] ZOX:[id number] DOB: 03-12-1940 DOA: 06/23/2014  PCP: Jeoffrey Massed, MD  Admit date: 06/23/2014 Discharge date: 06/29/2014  Recommendations for Outpatient Follow-up:  1.  Follow-up with pulmonology within 1-2 weeks of discharge for COPD exacerbation 2.  Follow-up with cardiology within 2 weeks for NSTEMI, ischemic and nonischemic cardiomyopathy. Please repeat BMP to check creatinine and potassium.  Repeat ECHO in 40 days. 3. PCP  For ongoing management of diabetes. Please follow-up on Hemoccult and A1c. 4. Ongoing PT/OT 5. CPAP at night 6. Daily weights and if she gains more than 3-lbs in one day or 5-lbs in one week, please call supervising physician 7. Started on salmetrol and prednisone taper.  She may discontinue her salmeterol if it causes nervousness 8. Changed atenolol to bisoprolol and simvastatin to atorvastatin 9. Discontinued pioglitazone due to heart failure 10. Reduced her home insulin due to episode of hypoglycemia.  She will need her insulin dose adjusted over the next few days please based on her CBGs  Discharge Diagnoses:  Principal Problem:   COPD exacerbation Active Problems:   Hyperlipidemia   Obesity   Essential hypertension, benign   CORONARY ATHEROSCLEROSIS NATIVE CORONARY ARTERY   Cardiomyopathy, ischemic   Diabetes mellitus with complication   Acute on chronic respiratory failure   NSTEMI (non-ST elevated myocardial infarction)   Acute renal failure   Leukocytosis   Acute respiratory failure   Takotsubo cardiomyopathy   Discharge Condition:  Stable, improved  Diet recommendation:  Low-salt , diabetic  Wt Readings from Last 3 Encounters:  06/29/14 98.975 kg (218 lb 3.2 oz)  05/21/14 105.688 kg (233 lb)  05/17/14 106.595 kg (235 lb)    History of present illness /brief summary:   74 yo female former smoker with multiple medical problems including O2 dependent COPD (2L), HTN, CHF, CAD, OSA (noncompliant with  CPAP), and DM type 2 who was initially admitted to [facility] 4/20 with acute on chronic respiratory failure. She was placed on bipap and treated with BD, steroids, NTG and diuresis. She improved initially but troponins were elevated (0.38 -> 15.13 -> 20.81 -> 11.97) and she was tx 4/20 [facility] eval. On 4/21 she developed acutely worsening SOB, HTN with SBP 190's and was placed back on bipap and PCCM consulted.  she required intubation on 4/21. She underwent cardiac catheterization on 4/22 by Dr. Excell Seltzer who found nonobstructive CAD. Her echocardiogram demonstrated mild LVH, moderate severe reduction of left ventricular systolic function with an ejection fraction of 30-35% and severe hypokinesis of the mid apical inferior and apical myocardium. She was felt to have Takotsubo Cardiomyopathy and she was diuresed. She was also continued on treatment for COPD exacerbation. She was extubated on 4/23 and gradually improved.  Hospital Course:   Acute on chronic respiratory failure due to acute on chronic sCHF, NSTEMI type 2, OSA and COPD exacerbation  -   She was seen by pulmonology and critical care for acute respiratory failure requiring intubation and for her NSTEMI type II  With acute on chronic systolic heart failure exacerbation - Diuresis per cards as able, lasix 40 QD -  Weight at the time of discharge is 99 kg -  She should continue daily weights and if she gains 3 pounds in one day or 5 pounds from the time of discharge, she should call the cardiology office. - Transitioned to prednisone to complete a  long taper - Completed 6 days of Levofloxacin  In hospital. No further antibiotics  at discharge -  Given prescription for LABA/ICS/ACH at discharge along with SABA - Continue CPAP daily at bedtime - FU with Dr Alroy Dust in [place] PCCM - FU with Dr. Excell Seltzer in Cardiology clinic  HTN, Blood pressure low normal  Acute on chronic HFrEF -- EF 35% 2008,  repeat 2D echo with EF 35% with probable Takotsubo cardiomyopathy - Continue BB, ARB, lasix -  She was changed from atenolol to low dose bisoprolol due to COPD -   She is not on hydralazine/Imdur/spironolactone secondary to hypotension  NSTEMI type 2, Nonobstructive CAD, likely secondary to Takotsubo cardiomyopathy - peak troponin 20.8, trended down to 4.6,  - Continued imdur, ASA, BB, Cozaar  -  Changed to high dose statin  Probable cardiorenal syndrome, creatinine trended down from 1.9 to 1.2 with diuresis  Morbid Obesity, healthy heart diet  Leukocytosis due to steroids, trended down as steroids were tapered  DM type 2 - poorly controlled CBGs during hospitalization, in part due to stress from illness and steroids - A1c pending  Anxiety, stable, cont PRN xanax   Hyponatremia due to heart failure, resolved Hypernatremia - due to free water restriction while intubated, resolved  Consultants:  cardiology  Pulmonary critical care  Procedures: 4/21 Emergently intubated due to acute respiratory failure with hypoxemia and hypercapnic 4/22 LHC >> stable 2 vessel CAD, rec's for medical management. Suspect Takotsubo 4/23 Extubated to BiPAP  Antibiotics:  levofloxacin from 4/20 > 4/25  Discharge Exam: Filed Vitals:   06/29/14 0938  BP: 121/73  Pulse:   Temp: 98 F (36.7 C)  Resp: 20   Filed Vitals:   06/29/14 0710 06/29/14 0938 06/29/14 0953 06/29/14 1156  BP:  121/73    Pulse:      Temp:  98 F (36.7 C)    TempSrc:  Oral    Resp:  20    Height:      Weight: 98.975 kg (218 lb 3.2 oz)     SpO2:  93% 97% 94%    General: Obese female, NAD . She is able to complete a full sentence.  HEENT: NCAT, MMM  Cardiovascular: RRR, nl S1, S2 no mrg, 2+ pulses, warm extremities  Respiratory: Very diminished bilateral breath sounds, no obvious wheezes, rales, or rhonchi  Abdomen: NABS, soft, NT/ND  MSK: Normal tone and bulk, no LEE  Neuro:  Grossly intact  Discharge Instructions      Discharge Instructions    (HEART FAILURE PATIENTS) Call MD:  Anytime you have any of the following symptoms: 1) 3 pound weight gain in 24 hours or 5 pounds in 1 week 2) shortness of breath, with or without a dry hacking cough 3) swelling in the hands, feet or stomach 4) if you have to sleep on extra pillows at night in order to breathe.    Complete by:  As directed      Call MD for:  difficulty breathing, headache or visual disturbances    Complete by:  As directed      Call MD for:  extreme fatigue    Complete by:  As directed      Call MD for:  hives    Complete by:  As directed      Call MD for:  persistant dizziness or light-headedness    Complete by:  As directed      Call MD for:  persistant nausea and vomiting    Complete by:  As directed      Call MD for:  severe uncontrolled pain    Complete by:  As directed      Call MD for:  temperature >100.4    Complete by:  As directed      Diet - low sodium heart healthy    Complete by:  As directed      Diet Carb Modified    Complete by:  As directed      Increase activity slowly    Complete by:  As directed             Medication List    STOP taking these medications        atenolol 50 MG tablet  Commonly known as:  TENORMIN     azithromycin 250 MG tablet  Commonly known as:  ZITHROMAX     etodolac 400 MG tablet  Commonly known as:  LODINE     fluticasone 50 MCG/ACT nasal spray  Commonly known as:  FLONASE     gabapentin 300 MG capsule  Commonly known as:  NEURONTIN     HYDROcodone-acetaminophen 5-325 MG per tablet  Commonly known as:  NORCO/VICODIN     pioglitazone 15 MG tablet  Commonly known as:  ACTOS     simvastatin 80 MG tablet  Commonly known as:  ZOCOR     traMADol 50 MG tablet  Commonly known as:  ULTRAM      TAKE these medications        albuterol (2.5 MG/3ML) 0.083% nebulizer solution  Commonly known as:  PROVENTIL  Take 3 mLs (2.5 mg total) by  nebulization 4 (four) times daily.     ALPRAZolam 0.5 MG tablet  Commonly known as:  XANAX  Take 1 tablet (0.5 mg total) by mouth 3 (three) times daily.     aspirin 81 MG tablet  Take 81 mg by mouth daily.     atorvastatin 40 MG tablet  Commonly known as:  LIPITOR  Take 1 tablet (40 mg total) by mouth daily at 6 PM.     bisoprolol 5 MG tablet  Commonly known as:  ZEBETA  Take 0.5 tablets (2.5 mg total) by mouth daily.     budesonide 0.25 MG/2ML nebulizer solution  Commonly known as:  PULMICORT  Take 2 mLs (0.25 mg total) by nebulization 2 (two) times daily.     calcium-vitamin D 500-200 MG-UNIT per tablet  Commonly known as:  OSCAL WITH D  Take 1 tablet by mouth 2 (two) times daily.     dextromethorphan-guaiFENesin 30-600 MG per 12 hr tablet  Commonly known as:  MUCINEX DM  Take 2 tablets by mouth every 12 (twelve) hours.     diphenoxylate-atropine 2.5-0.025 MG per tablet  Commonly known as:  LOMOTIL  Take 1 tablet by mouth 4 (four) times daily as needed for diarrhea or loose stools.     ferrous sulfate 325 (65 FE) MG tablet  Take 325 mg by mouth daily with breakfast.     furosemide 40 MG tablet  Commonly known as:  LASIX  Take 1.5 tablets (60 mg total) by mouth daily.     glucose blood test strip  Commonly known as:  ONE TOUCH ULTRA TEST  TEST BLOOD SUGAR LEVELS THREE TIMES DAILY     ibandronate 150 MG tablet  Commonly known as:  BONIVA  TAKE 1 TABLET EVERY MONTH ON THE SAME DAY     insulin aspart protamine - aspart (70-30) 100 UNIT/ML FlexPen  Commonly known as:  NOVOLOG MIX 70/30 FLEXPEN  Inject  0.25 mLs (25 Units total) into the skin 2 (two) times daily with a meal. E11.8     ipratropium 0.02 % nebulizer solution  Commonly known as:  ATROVENT  Take 2.5 mLs (500 mcg total) by nebulization 4 (four) times daily.     isosorbide mononitrate 30 MG 24 hr tablet  Commonly known as:  IMDUR  TAKE 1 TABLET DAILY     losartan 50 MG tablet  Commonly known as:   COZAAR  TAKE ONE TABLET BY MOUTH ONCE DAILY     nitroGLYCERIN 0.4 MG SL tablet  Commonly known as:  NITROSTAT  Place 1 tablet (0.4 mg total) under the tongue every 5 (five) minutes as needed. May repeat x3     omeprazole 20 MG capsule  Commonly known as:  PRILOSEC  TAKE ONE CAPSULE BY MOUTH ONCE DAILY 30 MINUTES BEFORE A MEAL     ONE TOUCH LANCETS Misc  Check blood sugars two times daily     potassium chloride 10 MEQ tablet  Commonly known as:  K-DUR  Take 1 tablet (10 mEq total) by mouth daily.     predniSONE 10 MG tablet  Commonly known as:  DELTASONE  Take 4 tabs daily for 3 days, 3 tabs daily x 3 days, 2 tabs daily for three days, 1 tab daily for three days, then stop     PROBIOTIC DAILY PO  Take 1 tablet by mouth daily.     salmeterol 50 MCG/DOSE diskus inhaler  Commonly known as:  SEREVENT  Inhale 1 puff into the lungs 2 (two) times daily.     sodium chloride 0.65 % Soln nasal spray  Commonly known as:  OCEAN  Place 1 spray into both nostrils 2 (two) times daily as needed for congestion.     TRUEPLUS INSULIN SYRINGE 30G X 5/16" 0.5 ML Misc  Generic drug:  Insulin Syringe-Needle U-100  USE 3 TIMES A DAY AS DIRECTED     vitamin B-12 500 MCG tablet  Commonly known as:  CYANOCOBALAMIN  Take 500 mcg by mouth daily.     vitamin C 500 MG tablet  Commonly known as:  ASCORBIC ACID  Take 500 mg by mouth daily.     Vitamin D3 1000 UNITS Caps  Take 1 capsule by mouth daily.       Follow-up Information    Follow up with [facility].   Why:  Call this number to get a new Primary Care Physician   Contact information:   [phone]      Follow up with NADEL,SCOTT M, MD. Schedule an appointment as soon as possible for a visit in 2 weeks.   Specialty:  Pulmonary Disease   Contact information:   [address] [place] [address] [phone]       Follow up with Tonny Bollman, MD. Schedule an appointment as soon as possible for a visit in 2 weeks.    Specialty:  Cardiology   Contact information:   [address] [place] [address] [phone]        The results of significant diagnostics from this hospitalization (including imaging, microbiology, ancillary and laboratory) are listed below for reference.    Significant Diagnostic Studies: Dg Chest 2 View  06/23/2014   CLINICAL DATA:  74 year old female with a history of shortness of breath for 3 days  EXAM: CHEST - 2 VIEW  COMPARISON:  Plain film 08/29/2013, 06/22/2014  FINDINGS: Cardiomediastinal silhouette unchanged in size and contour. Coronary stents  in place.  Similar appearance of mild interstitial and airspace opacities.  No pleural effusion or pneumothorax.  No displaced fracture.  Unremarkable appearance of the upper abdomen.  IMPRESSION: Mixed interstitial and airspace opacities are nonspecific, potentially representing edema, atelectasis, and/ or atypical infection/consolidation.  Coronary artery disease.  Signed,  Yvone Neu. Loreta Ave, DO  [facility]  [facility]   Electronically Signed   By: Gilmer Mor D.O.   On: 06/23/2014 22:03   Dg Chest Port 1 View  06/27/2014   CLINICAL DATA:  Acute respiratory failure  EXAM: PORTABLE CHEST - 1 VIEW  COMPARISON:  06/25/2014  FINDINGS: Endotracheal tube has been removed. Heart size upper limits of normal. Patchy reticulonodular airspace opacities bilaterally are stable. No new focal airspace disease. Minimal eventration of the right hemidiaphragm reidentified, mimicking subdiaphragmatic free air. The patient has no recent history of abdominal surgery.  IMPRESSION: Stable patchy nonspecific reticulonodular airspace opacities. No new acute finding. Maintained aeration after extubation.   Electronically Signed   By: Christiana Pellant M.D.   On: 06/27/2014 09:28   Dg Chest Port 1 View  06/25/2014   CLINICAL DATA:  Repositioning of endotracheal tube.  EXAM: PORTABLE CHEST - 1 VIEW   COMPARISON:  Single view of the chest earlier this same day.  FINDINGS: Endotracheal tube is in place with the tip in good position 3.4 cm above the carina. NG tube courses into the stomach and below the inferior margin of film. The chest is hyperexpanded compatible with emphysema. Mild basilar atelectasis is noted. Heart size is upper normal.  IMPRESSION: ET tube projects in good position.  Pulmonary hyperexpansion compatible with emphysema with mild bi basilar atelectasis noted.   Electronically Signed   By: Drusilla Kanner M.D.   On: 06/25/2014 13:29   Dg Chest Portable 1 View  06/25/2014   CLINICAL DATA:  Respiratory failure  EXAM: PORTABLE CHEST - 1 VIEW  COMPARISON:  06/24/2014  FINDINGS: Endotracheal tube is 7 cm above the carina. NG tube enters the stomach. Patchy lingular and right basilar airspace opacity, similar prior study. Increasing patchy right upper lobe opacity. No effusions. No acute bony abnormality.  IMPRESSION: Patchy bibasilar opacities are stable. Slight increased right upper lobe airspace opacity.  Endotracheal tube retracted, now 7 cm above the carina.   Electronically Signed   By: Charlett Nose M.D.   On: 06/25/2014 04:42   Dg Chest Port 1 View  06/24/2014   CLINICAL DATA:  Endotracheal and enteric tube placement.  EXAM: PORTABLE CHEST - 1 VIEW  COMPARISON:  Earlier same day ; 06/23/2014 ; 08/29/2013  FINDINGS: Grossly unchanged borderline enlarged cardiac silhouette and mediastinal contours with atherosclerotic plaque within the thoracic aorta. Endotracheal tube overlies the tracheal air column with tip superior to the carina. Enteric tube tip terminates below the left hemidiaphragm. No pneumothorax. Grossly unchanged bibasilar heterogeneous opacities. No new focal airspace opacities. No pleural effusion or pneumothorax. No evidence of edema. No acute osseus abnormalities.  IMPRESSION: 1. Appropriately positioned support apparatus as above. No pneumothorax. 2. Similar findings of  lung hyperexpansion and bibasilar opacities, left greater than right, likely atelectasis or scar.   Electronically Signed   By: Simonne Come M.D.   On: 06/24/2014 18:12   Dg Chest Port 1 View  06/24/2014   CLINICAL DATA:  Shortness of breath.  EXAM: PORTABLE CHEST - 1 VIEW  COMPARISON:  June 24, 2014.  FINDINGS: Stable cardiomediastinal silhouette. No pneumothorax or pleural effusion is noted. Stable  linear density is noted in lingular region consistent with subsegmental atelectasis. Possible cavitary abnormality is noted in right midlung. No significant abnormality seen in the skeleton.  IMPRESSION: Stable linear density seen in lingular region consistent with subsegmental atelectasis. Possible cavitary abnormality seen right mid lung. CT scan of the chest is recommended for further evaluation.   Electronically Signed   By: Lupita Raider, M.D.   On: 06/24/2014 12:27   Dg Chest Port 1 View  06/24/2014   CLINICAL DATA:  Acute onset shortness of breath and weakness  EXAM: PORTABLE CHEST - 1 VIEW  COMPARISON:  06/23/2014  FINDINGS: Stable cardiomegaly with improving bibasilar atelectasis. No current pneumonia, edema, collapse or consolidation. No effusion or pneumothorax. Trachea midline. Degenerative changes of the spine and shoulders.  IMPRESSION: cardiomegaly with improving bibasilar atelectasis.   Electronically Signed   By: Judie Petit.  Shick M.D.   On: 06/24/2014 08:40    Microbiology: Recent Results (from the past 240 hour(s))  MRSA PCR Screening     Status: Abnormal   Collection Time: 06/23/14  5:23 PM  Result Value Ref Range Status   MRSA by PCR POSITIVE (A) NEGATIVE Final    Comment:        The GeneXpert MRSA Assay (FDA approved for NASAL specimens only), is one component of a comprehensive MRSA colonization surveillance program. It is not intended to diagnose MRSA infection nor to guide or monitor treatment for MRSA infections. RESULT CALLED TO, READ BACK BY AND VERIFIED WITH: S COOPER RN  2145 06/23/14 A BROWNING   Clostridium Difficile by PCR     Status: None   Collection Time: 06/27/14  5:59 PM  Result Value Ref Range Status   C difficile by pcr NEGATIVE NEGATIVE Final     Labs: Basic Metabolic Panel:  Recent Labs Lab 06/24/14 0935 06/25/14 0320 06/27/14 0351 06/28/14 0452 06/29/14 0552  NA 138 142 153* 141 139  K 4.6 4.1 4.0 3.3* 3.9  CL 95* 98 100 94* 94*  CO2 28 31 39* 36* 34*  GLUCOSE 453* 372* 233* 106* 72  BUN 48* 52* 54* 45* 39*  CREATININE 1.65* 1.48* 1.44* 1.22* 1.24*  CALCIUM 9.1 8.8 9.3 9.1 9.6  MG  --  2.3  --   --   --   PHOS  --  2.8  --   --   --    Liver Function Tests:  Recent Labs Lab 06/24/14 0935 06/28/14 0452  AST 39* 31  ALT 34 42*  ALKPHOS 61 50  BILITOT 0.5 1.1  PROT 6.4 5.8*  ALBUMIN 3.4* 2.9*   No results for input(s): LIPASE, AMYLASE in the last 168 hours. No results for input(s): AMMONIA in the last 168 hours. CBC:  Recent Labs Lab 06/23/14 2044  06/24/14 0935 06/25/14 0320 06/26/14 0746 06/27/14 0351 06/28/14 0452  WBC 17.6*  < > 15.5* 14.0* 12.6* 14.5* 13.6*  NEUTROABS 15.3*  --   --   --   --   --   --   HGB 13.4  < > 14.0 12.7 13.7 14.6 14.4  HCT 41.9  < > 43.2 40.2 43.2 47.2* 45.2  MCV 93.5  < > 93.5 94.8 96.2 98.1 95.8  PLT 234  < > 213 206 167 192 180  < > = values in this interval not displayed. Cardiac Enzymes:  Recent Labs Lab 06/23/14 2044 06/24/14 0401 06/24/14 0935 06/24/14 1440 06/24/14 1951  TROPONINI 9.89* 4.96* 3.46* 5.26* 4.63*   BNP: BNP (last  3 results)  Recent Labs  06/24/14 0935 06/25/14 0320 06/27/14 0351  BNP 250.9* 321.3* 189.3*    ProBNP (last 3 results) No results for input(s): PROBNP in the last 8760 hours.  CBG:  Recent Labs Lab 06/28/14 1622 06/28/14 2128 06/29/14 0542 06/29/14 0628 06/29/14 1149  GLUCAP 257* 159* 66* 79 167*    Time coordinating discharge: 60 minutes  Signed:  Brylee Mcgreal  [facility] 06/29/2014, 12:22  PM

## 2014-06-29 NOTE — Progress Notes (Signed)
Hypoglycemic Event  CBG: 66  Treatment: 15 GM carbohydrate snack  Symptoms: None  Follow-up CBG: Time:79 CBG Result: 0628  Possible Reasons for Event: Unknown  Comments/MD notified: Sliding scale insulin held this AM, pt to receive meal coverage as well, will notify day shift RN of CBG.     Penni Bombardosenberger, Ivania Teagarden A  Remember to initiate Hypoglycemia Order Set & complete

## 2014-06-29 NOTE — Telephone Encounter (Signed)
New message      [facility]  Not sure if it is a TCM because a nurse named Arna Mediciora made the appt instead of our PA/NP.  However, she needed it within 1 week

## 2014-06-30 ENCOUNTER — Telehealth: Payer: Self-pay | Admitting: Pulmonary Disease

## 2014-06-30 LAB — HEMOGLOBIN A1C
Hgb A1c MFr Bld: 10.2 % — ABNORMAL HIGH (ref 4.8–5.6)
Mean Plasma Glucose: 246 mg/dL

## 2014-06-30 LAB — BLOOD GAS, ARTERIAL
ACID-BASE EXCESS: 7.5 mmol/L — AB (ref 0.0–2.0)
Bicarbonate: 34.5 mEq/L — ABNORMAL HIGH (ref 20.0–24.0)
DRAWN BY: 43752
FIO2: 1 %
LHR: 16 {breaths}/min
O2 Saturation: 99.4 %
PATIENT TEMPERATURE: 98.6
PEEP: 5 cmH2O
TCO2: 37 mmol/L (ref 0–100)
VT: 440 mL
pCO2 arterial: 80.5 mmHg (ref 35.0–45.0)
pH, Arterial: 7.255 — ABNORMAL LOW (ref 7.350–7.450)
pO2, Arterial: 300 mmHg — ABNORMAL HIGH (ref 80.0–100.0)

## 2014-06-30 NOTE — Telephone Encounter (Signed)
Left message to call back  

## 2014-06-30 NOTE — Clinical Social Work Placement (Signed)
   CLINICAL SOCIAL WORK PLACEMENT  NOTE  Date:  06/30/2014  Patient Details  Name: [name] MRN: [id number] Date of Birth: 03/31/1940  Clinical Social Work is seeking post-discharge placement for this patient at the Skilled  Nursing Facility level of care (*CSW will initial, date and re-position this form in  chart as items are completed):  Yes   Patient/family provided with [place] Clinical Social Work Department's list of facilities offering this level of care within the geographic area requested by the patient (or if unable, by the patient's family).  Yes   Patient/family informed of their freedom to choose among providers that offer the needed level of care, that participate in Medicare, Medicaid or managed care program needed by the patient, have an available bed and are willing to accept the patient.  Yes   Patient/family informed of [place]'s ownership interest in [facility] and [facility], as well as of the fact that they are under no obligation to receive care at these facilities.  PASRR submitted to EDS on 06/28/14     PASRR number received on 06/28/14     Existing PASRR number confirmed on       FL2 transmitted to all facilities in geographic area requested by pt/family on 06/28/14     FL2 transmitted to all facilities within larger geographic area on 06/28/14     Patient informed that his/her managed care company has contracts with or will negotiate with certain facilities, including the following:  Other - please specify in the comment section below: ([facility])     Yes   Patient/family informed of bed offers received.  [facility]     Physician recommends and patient chooses bed at      [facility] on 06/29/14.  Patient to be transferred to facility by Ambulance  [facility])     Patient family  notified on 06/29/14 of transfer.  Name of family member notified:  Sandi MealyDebbie Chilton [phone]     PHYSICIAN Please prepare priority discharge summary, including medications, Please sign FL2, Please prepare prescriptions     Additional Comment:    _______________________________________________ Darylene Pricerowder, [place] Spees T, LCSW 06/30/2014, 8:27 AM

## 2014-07-01 NOTE — Telephone Encounter (Signed)
Left message to call back  

## 2014-07-02 NOTE — Telephone Encounter (Signed)
Left message to call back  

## 2014-07-05 NOTE — Telephone Encounter (Signed)
Attempted to call patient on 4/27, 4/28, 4/29 and 5/2. LM on VM each time. Unable to contact patient. Daughter's number is disconnected. Will close encounter at this time.

## 2014-07-05 NOTE — Telephone Encounter (Signed)
LMTCB 5/2 @ 2:10 pm.

## 2014-07-06 ENCOUNTER — Ambulatory Visit (INDEPENDENT_AMBULATORY_CARE_PROVIDER_SITE_OTHER): Payer: PPO | Admitting: Cardiology

## 2014-07-06 ENCOUNTER — Encounter: Payer: Self-pay | Admitting: Cardiology

## 2014-07-06 VITALS — BP 121/58 | HR 125 | Ht 64.0 in | Wt 217.0 lb

## 2014-07-06 DIAGNOSIS — N183 Chronic kidney disease, stage 3 unspecified: Secondary | ICD-10-CM

## 2014-07-06 DIAGNOSIS — Z9861 Coronary angioplasty status: Secondary | ICD-10-CM

## 2014-07-06 DIAGNOSIS — I2510 Atherosclerotic heart disease of native coronary artery without angina pectoris: Secondary | ICD-10-CM

## 2014-07-06 DIAGNOSIS — M6249 Contracture of muscle, multiple sites: Secondary | ICD-10-CM

## 2014-07-06 DIAGNOSIS — I214 Non-ST elevation (NSTEMI) myocardial infarction: Secondary | ICD-10-CM

## 2014-07-06 DIAGNOSIS — I255 Ischemic cardiomyopathy: Secondary | ICD-10-CM

## 2014-07-06 DIAGNOSIS — N289 Disorder of kidney and ureter, unspecified: Secondary | ICD-10-CM

## 2014-07-06 DIAGNOSIS — M6240 Contracture of muscle, unspecified site: Secondary | ICD-10-CM

## 2014-07-06 DIAGNOSIS — J9621 Acute and chronic respiratory failure with hypoxia: Secondary | ICD-10-CM

## 2014-07-06 DIAGNOSIS — J441 Chronic obstructive pulmonary disease with (acute) exacerbation: Secondary | ICD-10-CM

## 2014-07-06 LAB — COMPREHENSIVE METABOLIC PANEL
ALT: 25 U/L (ref 0–35)
AST: 15 U/L (ref 0–37)
Albumin: 3.1 g/dL — ABNORMAL LOW (ref 3.5–5.2)
Alkaline Phosphatase: 63 U/L (ref 39–117)
BUN: 31 mg/dL — ABNORMAL HIGH (ref 6–23)
CO2: 35 mEq/L — ABNORMAL HIGH (ref 19–32)
Calcium: 9.4 mg/dL (ref 8.4–10.5)
Chloride: 90 mEq/L — ABNORMAL LOW (ref 96–112)
Creatinine, Ser: 1.31 mg/dL — ABNORMAL HIGH (ref 0.40–1.20)
GFR: 42.19 mL/min — ABNORMAL LOW (ref >60.00)
Glucose, Bld: 338 mg/dL — ABNORMAL HIGH (ref 70–99)
Potassium: 3.8 mEq/L (ref 3.5–5.1)
Sodium: 132 mEq/L — ABNORMAL LOW (ref 135–145)
Total Bilirubin: 0.5 mg/dL (ref 0.2–1.2)
Total Protein: 6 g/dL (ref 6.0–8.3)

## 2014-07-06 LAB — MAGNESIUM: Magnesium: 1.5 mg/dL (ref 1.5–2.5)

## 2014-07-06 NOTE — Assessment & Plan Note (Signed)
LAD stent 07/1998. 02/1999, brachy Rx 2001, cath 06/25/14- patent stents, medical Rx

## 2014-07-06 NOTE — Assessment & Plan Note (Signed)
EF 30-35% with apical AK- suspected Takotsubo event

## 2014-07-06 NOTE — Assessment & Plan Note (Signed)
GFR 42 

## 2014-07-06 NOTE — Assessment & Plan Note (Signed)
During recent hospitalization. Chronic baseline COPD on continuous O2

## 2014-07-06 NOTE — Patient Instructions (Signed)
Medication Instructions:  Your physician recommends that you continue on your current medications as directed. Please refer to the Current Medication list given to you today.   Labwork: CMET AND MAGNESIUM   Testing/Procedures: NONE TODAY   Follow-Up: WITH DR MCDOWELL AT THE [facility] OFFICE 2 TO 3 MONTHS  Any Other Special Instructions Will Be Listed Below (If Applicable).  REFERRAL TO [facility] FOR INVOLUNTARY MUSCLE MOVEMENT IN ARM

## 2014-07-06 NOTE — Progress Notes (Signed)
07/06/2014 [name]   [birth year]  [id number]  Primary Physician Jeoffrey Massed, MD Primary Cardiologist: Dr Diona Browner  HPI:  74 y/o F with CAD (history of MI with PTCA/stent to LAD 07/1998 with repeat PTCA to this lesion in 02/1999, PTCA/brachytherapy to mLAD in 2001), COPD/severe refractory asthma with chronic respiratory failure on home O2, HTN, HLD, anemia, DM, OSA, anxiety, polypharmacy, chronic systolic CHF EF 35-40% who presented to [facility] 06/22/14 with acute worsening of SOB. She ruled in for an MI with a Troponin of 20 at [facility]. She was transferred to [facility]. She required Bi Pap. Echo showed an EF of 30% with apical AK. Cath 06/25/14 revealed patent stents with residual distal disease. 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 Plan is for medical Rx. She has severe COPD and is on chronic O2. She is in a skilled facility for rehab. Her daughter accompanied her today. Apparently her daughter got into an argument with the staff at her PCP's office and the pt no longer has a PCP.   Current Outpatient Prescriptions  Medication Sig Dispense Refill  . acetaminophen (TYLENOL) 650 MG suppository Place 650 mg rectally every 4 (four) hours as needed.    Marland Kitchen albuterol (PROVENTIL) (2.5 MG/3ML) 0.083% nebulizer solution Take 3 mLs (2.5 mg total) by nebulization 4 (four) times daily. 75 mL 11  . ALPRAZolam (XANAX) 0.5 MG tablet Take 1 tablet (0.5 mg total) by mouth 3 (three) times daily. 90 tablet 0  . aspirin 81 MG tablet Take 81 mg by mouth daily.    Marland Kitchen atorvastatin (LIPITOR) 40 MG tablet Take 1 tablet (40 mg total) by mouth daily at 6 PM. 30 tablet 0  . bisoprolol (ZEBETA) 5 MG tablet Take 0.5 tablets (2.5 mg total) by mouth daily. 45 tablet 0  . budesonide (PULMICORT) 0.25 MG/2ML nebulizer solution Take 2 mLs (0.25 mg total) by nebulization 2 (two) times daily. 60 mL 11  . calcium-vitamin D (OSCAL WITH D) 500-200 MG-UNIT per tablet Take 1 tablet by mouth 2 (two) times daily.      .  Cholecalciferol (VITAMIN D3) 1000 UNITS CAPS Take 1 capsule by mouth daily.     Marland Kitchen dextromethorphan-guaiFENesin (MUCINEX DM) 30-600 MG per 12 hr tablet Take 2 tablets by mouth every 12 (twelve) hours.      . diphenoxylate-atropine (LOMOTIL) 2.5-0.025 MG per tablet Take 1 tablet by mouth 4 (four) times daily as needed for diarrhea or loose stools. 30 tablet 0  . ferrous sulfate 325 (65 FE) MG tablet Take 325 mg by mouth daily with breakfast.     . glucose blood (ONE TOUCH ULTRA TEST) test strip TEST BLOOD SUGAR LEVELS THREE TIMES DAILY 100 each 5  . ibandronate (BONIVA) 150 MG tablet TAKE 1 TABLET EVERY MONTH ON THE SAME DAY 1 tablet 1  . insulin aspart protamine - aspart (NOVOLOG MIX 70/30 FLEXPEN) (70-30) 100 UNIT/ML FlexPen Inject 0.25 mLs (25 Units total) into the skin 2 (two) times daily with a meal. E11.8 15 mL 11  . Insulin Lispro, Human, (HUMALOG ) Inject into the skin.    Marland Kitchen ipratropium (ATROVENT) 0.02 % nebulizer solution Take 2.5 mLs (500 mcg total) by nebulization 4 (four) times daily. 75 mL 11  . isosorbide mononitrate (IMDUR) 30 MG 24 hr tablet TAKE 1 TABLET DAILY 90 tablet 1  . losartan (COZAAR) 50 MG tablet TAKE ONE TABLET BY MOUTH ONCE DAILY 30 tablet 3  . Multiple Vitamin (MULTIVITAMIN) tablet Take 1 tablet  by mouth daily.    . nitroGLYCERIN (NITROSTAT) 0.4 MG SL tablet Place 1 tablet (0.4 mg total) under the tongue every 5 (five) minutes as needed. May repeat x3 25 tablet 6  . omeprazole (PRILOSEC) 20 MG capsule TAKE ONE CAPSULE BY MOUTH ONCE DAILY 30 MINUTES BEFORE A MEAL 30 capsule 3  . ONE TOUCH LANCETS MISC Check blood sugars two times daily 100 each 6  . potassium chloride (K-DUR) 10 MEQ tablet Take 1 tablet (10 mEq total) by mouth daily. 30 tablet 0  . predniSONE (DELTASONE) 10 MG tablet Take 4 tabs daily for 3 days, 3 tabs daily x 3 days, 2 tabs daily for three days, 1 tab daily for three days, then stop 30 tablet 0  . Probiotic Product (PROBIOTIC DAILY PO) Take 1 tablet by  mouth daily.     . salmeterol (SEREVENT) 50 MCG/DOSE diskus inhaler Inhale 1 puff into the lungs 2 (two) times daily. 1 Inhaler 0  . sodium chloride (OCEAN) 0.65 % SOLN nasal spray Place 1 spray into both nostrils 2 (two) times daily as needed for congestion.    . Torsemide (DEMADEX PO) Take 40 mg by mouth 2 (two) times daily.    Lauris Poag INSULIN SYRINGE 30G X 5/16" 0.5 ML MISC USE 3 TIMES A DAY AS DIRECTED 100 each 6  . vitamin B-12 (CYANOCOBALAMIN) 500 MCG tablet Take 500 mcg by mouth daily.     . vitamin C (ASCORBIC ACID) 500 MG tablet Take 500 mg by mouth daily.     No current facility-administered medications for this visit.    Allergies  Allergen Reactions  . Codeine Nausea And Vomiting    vomiting  . Terbutaline Sulfate     REACTION: INTOL to terbutaline    History   Social History  . Marital Status: Widowed    Spouse Name: N/A  . Number of Children: N/A  . Years of Education: N/A   Occupational History  . Not on file.   Social History Main Topics  . Smoking status: Former Smoker -- 1.00 packs/day for 30 years    Types: Cigarettes    Quit date: 07/04/1998  . Smokeless tobacco: Never Used  . Alcohol Use: No  . Drug Use: No  . Sexual Activity: No   Other Topics Concern  . Not on file   Social History Narrative   Widow, lives alone x 17 yrs, in [place].   One son lives near her, daughter Eunice Blase lives in [place].  She drives.   Worked in Design[employer], fashion/clothing until about around age 49--due to chronic right shoulder pain/lung problems.   Tobacco 60 pack-yr hx, quit 2002 when she had an MI.   No alcohol.  No drugs.                 Review of Systems: General: negative for chills, fever, night sweats or weight changes.  Cardiovascular: negative for chest pain, dyspnea on exertion, edema, orthopnea, palpitations, paroxysmal nocturnal dyspnea or shortness of breath Dermatological: negative for rash Respiratory: negative for cough or wheezing Urologic: negative for  hematuria Abdominal: negative for nausea, vomiting, diarrhea, bright red blood per rectum, melena, or hematemesis Neurologic: negative for visual changes, syncope, or dizziness All other systems reviewed and are otherwise negative except as noted above.    Blood pressure 121/58, pulse 125, height  (1.626 m), weight 217 lb (98.431 kg).  General appearance: alert, cooperative, mild distress, mildly obese and on O2, in wheel chair Neck: no carotid  bruit and no JVD Lungs: rhonchi, wheezing Heart: regular rate and rhythm and decreased heart sounds Abdomen: non tender Pulses: diminnished Skin: cool, pale, dry Neurologic: Grossly normal  Pt complained of intermittent, involuntary tonic clonic movement in her Lt arm- I did observe this in the office, lasted 1 minute  EKG NSR, ST, frequent PACsIVCD  ASSESSMENT AND PLAN:   NSTEMI (non-ST elevated myocardial infarction) 06/22/14- transferred from [facility]. Pk Troponin there was 20   Acute on chronic respiratory failure On BiPap during her hospitaliuzation   COPD exacerbation During recent hospitalization. 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 02/1999, brachy Rx 2001, cath 06/25/14- patent stents, medical Rx    PLAN  She looks to be quit frail. I suspect she won't do well at home when discharged.   The pt had an event where her Lt arm began tonic clonic movement and contracted. This appeared to be a real event. The pt was unable to control it. Reviewed with Dr Patty SermonsBrackbill, will refer to Neurology. Check lytes, Ca++.   [facility] LLCKILROY,Latausha Flamm KPA-C 07/06/2014 4:29 PM

## 2014-07-06 NOTE — Assessment & Plan Note (Signed)
On BiPap during her hospitaliuzation

## 2014-07-06 NOTE — Assessment & Plan Note (Signed)
06/22/14- transferred from [facility]. Pk Troponin there was 20

## 2014-07-08 NOTE — Telephone Encounter (Signed)
Fleet ContrasRachel has forms been received? thanks

## 2014-07-09 NOTE — Telephone Encounter (Signed)
Fleet ContrasRachel - has this been done yet?  Please advise.

## 2014-07-09 NOTE — Telephone Encounter (Signed)
Spoke with pt's daughter. She stated that she dropped off paperwork in medical records to be filled out. Informed daughter that I do not have the forms but that i would call medical records to check on the status of the forms. Left voicemail with medical records to call me back about the forms. Waiting on return call.

## 2014-07-12 ENCOUNTER — Encounter: Payer: Self-pay | Admitting: Adult Health

## 2014-07-12 ENCOUNTER — Ambulatory Visit (INDEPENDENT_AMBULATORY_CARE_PROVIDER_SITE_OTHER): Payer: PPO | Admitting: Adult Health

## 2014-07-12 ENCOUNTER — Ambulatory Visit (INDEPENDENT_AMBULATORY_CARE_PROVIDER_SITE_OTHER)
Admission: RE | Admit: 2014-07-12 | Discharge: 2014-07-12 | Disposition: A | Discharge status: Home / Self Care | Payer: PPO | Source: Ambulatory Visit | Attending: Adult Health | Admitting: Adult Health

## 2014-07-12 VITALS — BP 128/98 | HR 93 | Temp 97.7°F | Ht 64.0 in | Wt 219.0 lb

## 2014-07-12 DIAGNOSIS — J189 Pneumonia, unspecified organism: Secondary | ICD-10-CM

## 2014-07-12 DIAGNOSIS — J9611 Chronic respiratory failure with hypoxia: Secondary | ICD-10-CM | POA: Diagnosis not present

## 2014-07-12 DIAGNOSIS — J441 Chronic obstructive pulmonary disease with (acute) exacerbation: Secondary | ICD-10-CM | POA: Diagnosis not present

## 2014-07-12 DIAGNOSIS — I214 Non-ST elevation (NSTEMI) myocardial infarction: Secondary | ICD-10-CM | POA: Diagnosis not present

## 2014-07-12 MED ORDER — PREDNISONE 10 MG PO TABS: ORAL_TABLET | ORAL | Status: DC

## 2014-07-12 MED ORDER — ARFORMOTEROL TARTRATE 15 MCG/2ML IN NEBU: 15.0000 ug | INHALATION_SOLUTION | Freq: Two times a day (BID) | RESPIRATORY_TRACT | Status: DC

## 2014-07-12 NOTE — Assessment & Plan Note (Signed)
Slow to resolve COPD exacerbation  Check chest x-ray today   Plan Stop Serevent  Beign Brovana Neb Twice daily  .  Stop Boniva for now.  Mucinex DM Twice daily  As needed  Cough /congestion  Prednisone taper over next week.  Please contact office for sooner follow up if symptoms do not improve or worsen or seek emergency care  follow up Dr. Kriste BasqueNadel  In 4 weeks and As needed   Chest xray today .

## 2014-07-12 NOTE — Progress Notes (Signed)
Subjective:    Patient ID: [name], female    DOB: Aug 29, 1940, 74 y.o.   MRN: [id number]  HPI73 y/o WF here for a follow up visit... Kellene has 13 grandchildren & 18 great-grands!  She also has mult med problems including severe COPD; HBP; CAD w/prev MI & PTCA followed by DrMcDowell; VI; Hyperlipidemia; DM on insulin; Obesity; GERD/ constipation; DJD/ osteoporosis; Anxiety...   ~  SEE PREV EPIC NOTES FOR OLDER DATA >>   ~  Jul 16, 2012:  4-85mo ROV & post hosp check> [name] was eval recently by Cards for CP & an abn Myoview- Cath 5/14 revealed mod diffuse LAD disease w/ 50%proxLAD stenosis w/ a 60-70% in-stent restenosis in the midLAD; RCA was small w/ a 75% prox stenosis; LCx was dominant w/o focal stenosis but w/ mod diffuse calcium; HK of inferoapical wall w/ EF= 35-40% and mild-mod MR (Rec was for continued medical management)... We reviewed the following medical problems during today's office visit >>     HxSinusitis> on MMW, Flonase, OTC antihist prn; c/o chr congestion, no recent infection...    COPD> on O2, NEBS w/ Duoneb/ Budes, Mucinex2Bid; stable w/o exac & denies ch in cough/ sput/ SOB, etc...    HBP> on Aten50, Losar50, Lasix40; labs 5/14 w/ normal lytes & renal; BP= 126/82 & she denies CP, palpit, ch in SOB, edema, etc...    CAD> on ASA81, NTG prn; followed by DrMcDowell- seen 5/14 w/ abn Myoview & subseq Cath- AS ABOVE, med rx...    VI> stable on low sodium diet, Lasix40, elevation, support hose...    Hyperlipid> on Simva80; FLP 5/14 shows TChol 134, TG 227, HDL 33, LDL 74... Advised better low fat diet, exercise, & get wt down...    DM> on 70/30 insulin 30Bid, Metform500Bid; doing well she says; labs today showed BS=160, A1c=7.3 (sl improved- continue same + better diet)...    Obesity> wt is 201# which is down 6# from last visit; we reviewed diet, exercise, wt reduction strategies...    GI- GERD, Constip> on Omep20 & Probiotic; hx gastritis & prepyloric ulcers on EGD 3/12, rec to stay on  the PPI Rx; last colon 2007 by DrJacobs was neg...    DJD> on Lodine400Bid prn, Tramadol50Tid prn, folowed by [facility]...    Osteoporosis> on Boniva150, calcium, MVI, VitD1000; labs 8/13 showed VitD=38; last BMD 5/13 w/ TScore -1.6 in hips...    Neuropathy> on Neurontin300Qhs...    Anxiety> on Xanax0.5mg  prn...    Hx Borderline Anemia> on Fe, B12 1062mcg/d; labs 5/14 showed Hg=13.2, MCV=90, last Fe was 8/13 = 59 (18%sat),  We reviewed prob list, meds, xrays and labs> see below for updates >>   LABS 5/14:  FLP- not at goals w/ TG=227, HDL=33;  Chems- ok x BS=160, A1c=7.3;  CBC- wnl;  TSH=2.09...  ~  November 14, 2012:  75mo ROV & [name] is added-on for URI w/ cough, thick yellow sput, feeling poorly, SOB, wheezing, etc;  We called in Augmentin & rec nasal saline- min improvement but persist symptoms;  Exam shows decr BS 7 end exp wheezing/ rhonchi;  She has underlying COPD, ex-smoker, hx sinusitis, on NEBS, Mucinex;  Rec- Levaquin x10d, Depo80, Dosepak, incr Mucinex to 2Bid + fluids...     BP remains controlled on Aten, Losar, Lasix & measures 134/82;  She denies CP, palpit, edema;  Chol has been stable on Simva80 & she doesn't want to change;  DM controlled w/ 70/30 insulin on 30uBid, hasn't been able to  lose weight stating "I can't starve myself"    We reviewed prob list, meds, xrays and labs> see below for updates >>   ~  February 02, 2013:  2-57mo ROV & [name] has recovered from her bronchitis exac & now back to baseline; she is concerned about her memory & notes that she is under a lot of stress...    Breathing is now stable on her NEBS w/ Albut/ Ipratrop/ Budes, Mucinex, Flonase, MMW...    BP is controlled on Aten50, Losar50, Lasix40 & measures 140/76 today... She denies CP, palpit, ch in SOB, etc on her ASA81 & Imdur30...     Chol is regulated w/ Simva40 Qhs- last FLP 5/14 showed sl elev TG & she is reminded to follow a strict low fat diet 7 work on weight reduction...    DM control is fair on her  Metform500Bid and 70/30 insulin taking 30u Bid; Labs today showed BS=203, A1c= 7.6 & we rec that she incr the insulin to 35u Bid...    She remains on Prilosec20, [id number], Neurontin, Tramadol, Alprazolam as well...  We reviewed prob list, meds, xrays and labs> see below for updates >> she refuses the 2014 Flu vaccine...  LABS 12/14:  chems- ok x BS=203, A1c=7.6 on her Metform500Bid & 70/30 insulin 30uBid=> Rec to incr to 35uBid + diet & exercise...  ~  August 03, 2013:  37mo ROV & [name] has established Primary Care w/ Dr. Nicoletta Ba [facility];  She had right T10 Shingles 5/15- resolving;  Here for pulm follow up of her severe COPD >>     HxSinusitis> on MMW, Flonase, OTC antihist prn; c/o chr congestion, no recent infection...    COPD (GOLD Stage4)> on O2 (Helios system- 2L/min continuously), NEBS w/ DuonebQid/ BudesBid, Mucinex2Bid; stable w/o exac but at baseline has cough, thick beige sput, no hemoptysis, & chr dyspnea- ok w/ ADLs in general but SOB w/ chores/ housework/ walking in the house, etc...  We reviewed prob list, meds, xrays (3/15 CXR) and labs (3/15)> see below for updates >> REC to continue O2, NEBS, Mucinex & call promptly for any infectious exac etc...  CXR 3/15 showed cardiomeg, COPD/ atelectasis in lingula & LLL, DJD in Tspine & shoulders, NAD...  PFT 6/15 showed FVC=0.81 (28%), FEV1=0.43 (20%), %1sec=53%, mid-flows= 9% predicted... c/w GOLD Stage4 COPD.   ~  February 02, 2014:  37mo ROV & [name] has seen DrMcGowen several times- most recently 01/27/14 for a sick visit & felt to have a COPD exac- treaqted w/ Depo80, Pred taper, & Levaquin;  She states improved , breathing is better w/ min cough, whitish sput, & no f/c/s; she has baseline DOE- no better due to 17# wt gain & she admits to not dieting & not exercising! We reviewed diet, exercise & wt reduction strategies;  We walked her in the office on her O2 at 2L/min (see below);  We reviewed the following medical problems during  today's office visit >>     HxSinusitis> on MMW, Flonase, OTC antihist prn; c/o chr congestion, & recent infection treated by DrMcGowen w/ Levaquin...    COPD (GOLD Stage4)> on O2 (Helios system- 2L/min continuously), NEBS w/ DuonebQid/ BudesBid, Mucinex2Bid; +recent exac as noted & improved on Levaquin & Pred taper; chr dyspnea- ok w/ ADLs in general but SOB w/ chores/ housework/ walking in the house, etc; she has gained wt, not dieting & not exercising- we read her the riot act!    BP is controlled on Aten50,  Losar50, Lasix40 & measures 138/82 today... She denies CP, palpit, ch in SOB, etc on her ASA81 & Imdur30...     Chol is regulated w/ Simva80 Qhs- last FLP 7/15 showed elev TG & she is reminded to follow a strict low fat diet & work on weight reduction...    DM control is fair on her Actos15 and 70/30 insulin taking 30u Bid per Primary Care; Labs 10/15 showed BS=156, A1c= 7.9; she will f/u w/ Primary care soon    She remains on Prilosec20, Boniva150, Neurontin, Lodine/Tramadol, Vicodin, Alprazolam as well...  We reviewed prob list, meds, xrays and labs> see below for updates >> she continues to REFUSE Flu shots and Pneumonia vaccine...  Oxygen saturation on 2L/min at rest= 95% w/ pulse=64/min;  After walking 3 laps her O2sat=93% w/ pulse=84/min... Rec to continue same.     07/12/2014 Post Hospital follow up   patient presents for a post hospital follow-up  She was admitted April 20 through April 26 4 an acute COPD exacerbation, complicated by acute on chronic congestive heart failure  And NSTEMI   She did require intubation initially and was transitioned over to BiPAP support  She was treated with aggressive IV diuresis.  She underwent a cardiac catheterization and was found to have nonobstructive coronary disease.  Echo showed an EF of 30-35% with severe hypokinesis of the mid apical inferior and apical myocardium.  She was treated with antibiotics and steroids  She is supposed to be on  nocturnal C Pap at home. However, patient says she is intolerant of this. She was changed from atenolol to bisoprolol  She was discharged to a rehabilitation center.  Patient says since discharge she is very weak.  Over the last few days has had increased cough and wheezing  She denies any leg swelling  She denies any hemoptysis, discolored mucus, fever, chest pain, nausea, vomiting, diarrhea.  She has recently  Finished her prednisone and feels that now she is worse.  She was started on Serevent at discharge  She remains on budesonide  Nebulizer twice daily along with Atrovent  Nebulizer 4 times daily.           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 Rx'd w/ MUCINEX, Nasal Saline, FLONASE...   ~  2/11:  c/o sinusitis & AUGMENTIN perscribed. ~  9/13:  ER visit w/ jaw pain- had [place]13 [place]; CT showed socket left by right mandib molar extraction w/ inflamm; given PenVK, Vicodin, told f/u w/ dentist...  COPD (ICD-496) - she has COPD and is an ex-smoker... controlled on NEBULIZER w/ Duoneb Qid + Budesonide Bid, MUCINEX, etc... she has end stage disease w/ severe dyspnea w/ ADLs and no change over the last yr... ~  baseline CXR = COPD, incr heart size, NAD. ~ CT Chest 2/02 showed COPD, Atx, sl scarring, NAD. ~  bronchoscopy 3/02 w/ mucous plugging, no endobr lesions. ~  PFT's in 12/07 showed FVC=0.81 (27%), FEV1=0.54 (22%), %1sec=67, mid-flows=13%pred. ~  CXR 1/10 showed chr changes, cardiomeg, lingular atx. ~  CXR 2/11 showed cardiomeg, bibasilar scarring, NAD. ~  CXR 2/12 showed cardiomeg, COPD,scarring left base, NAD. ~  CXR 5/14 showed cardiomeg, mild basilar atx/ flattened diaph/ NAD, DJD in Tspine, s/p chole... ~  9/14:  Presents w/ URI & AB exac- unresponsive to Augmentin called in, & rec Levaquin, Depo, Dosepak, Mucinex, Fluids, etc... ~  CXR 3/15 showed  cardiomeg, COPD/  atelectasis in lingula & LLL, DJD in Tspine & shoulders, NAD... ~  6/15: on O2 (Helios- 2L/min continuously), NEBS w/ DuonebQid/ BudesBid, Mucinex2Bid; stable w/o exac but at baseline has cough, thick beige sput, no hemoptysis, & chr dyspnea- ok w/ ADLs in general but SOB w/ chores/ housework/ walking in the house, etc...  ~  PFT 6/15 showed FVC=0.81 (28%), FEV1=0.43 (20%), %1sec=53%, mid-flows= 9% predicted... c/w GOLD Stage4 COPD ~  12/15: she had a recent infectious exac treated by DrMcGowen w/ Levaquin & Pred; she is improved & approaching baseline, however she has gained 17# & not on diet & not exercising; we discussed need for diet, exercise & wt reduction; she will finish the Levaquin & Pred taper and continue the O2, NEBS, Mucinex, etc...  ~  Oxygen saturation on 2L/min at rest= 95% w/ pulse=64/min;  After walking 3 laps her O2sat=93% w/ pulse=84/min... Rec to continue same.    SHE HAS ESTABL[facility] PR[facility] CARE [facility]- DrMcGowen >>   HYPERTENSION (ICD-401.9) - on ATENOLOL 50mg /d, LOSARTAN 50mg /d, LASIX 40mg /d... BP=126/80 and tol well... denies HA, visual changes, CP, palipit, dizziness, syncope, change in dyspnea, edema, etc.  CORONARY ARTERY DISEASE (ICD-414.00) - on ASA 81mg /d & Imdur30... hx of CAD w/ prev MI in 2000, and subseq PTCA's in 2000, & 2001...  VENOUS INSUFFICIENCY (ICD-459.81) - low sodium diet, elevation, & Lasix 40-80mg /d... states she "allergic" to TED hose! and refuses to wear them...   HYPERLIPIDEMIA (ICD-272.4) - on ZOCOR 80mg /d + FISH OIL 1000mg /d ... she has been counselled on diet & exercise but unsuccessful...  DM (ICD-250.00) - on Actos15 + HUMULIN 70/30- now taking 30uAM & 30uPM she says...  OBESITY (ICD-278.00) - weight = 223# (BMI=37); we discussed diet + exercise program required to lose weight!  GERD (ICD-530.81) - on PPI therapy (changed 2/10 per request to OMEPRAZOLE20mg /d)... ~  [facility] & was anemic w/ heme pos stool &  GIB; EGD by DrJacobs w/ gastritis & prepyloric ulcers, +HPylori- treated & improved.  CONSTIPATION (ICD-564.00) - last colonoscopy was 11/07 by DrJacobs and negative...  DEGENERATIVE JOINT DISEASE (ICD-715.90) - on Vicodin, Trmaadol, Neurontin; prev CSpine films w/ spondy and biforaminal narrowing... LSSpine films w/ DDD and osteopenia...   OSTEOPOROSIS (ICD-733.00) - on BONIVA 150mg /mo, Calcium, Vits...   ANXIETY (ICD-300.00) - uses Alprazolam 0.5mg  as needed...  Hx ANEMIA, MILD (ICD-285.9)  Health Maintenance - she hasn't seen GYN in yrs and is asked to re-establish... prev refused Mammograms due to the discomfort & bruising- she will discuss w/ Gyn & get a follow up BMD as well...   Past Surgical History  Procedure Laterality Date  . Total abdominal hysterectomy      DUB (non-malignant reasons).  No hx of abnormal pap smears.  . Shoulder open rotator cuff repair Right   . Cataract extraction w/phaco Left 06/30/2012    Procedure: CATARACT EXTRACTION PHACO AND INTRAOCULAR LENS PLACEMENT (IOC);  Surgeon: Gemma PayorKerry Hunt, MD;  Location: [facility];  Service: Ophthalmology;  Laterality: Left;  CDE:12.32  . Transthoracic echocardiogram  06/2013    EF 40%, + WMA  . Alveoloplasty  10/1999    [name]/notes 07/19/2010 Surgical extraction of root segments #21, 22 and 27 with alveoloplasty.  . Cardiac catheterization  07/2012    + progressive CAD but no lesions to intervene upon, EF 65%; med mgmt continued  . Coronary angioplasty with stent placement      PTCA/BMS LAD 2000, PTCA/brachytherapy LAD 2001, LVEF 55%   .  Coronary angioplasty with stent placement  2000; 2001; 2002    [name] 05/18/2010  . Tonsillectomy  1949  . Laparoscopic cholecystectomy    . Cataract extraction w/ intraocular lens implant Right 2015?  Marland Kitchen Left heart catheterization with coronary angiogram N/A 06/25/2014    Procedure: LEFT HEART CATHETERIZATION WITH CORONARY ANGIOGRAM;  Surgeon: Lyn Records, MD;  Location: [facility] CATH LAB;  Service:  Cardiovascular;  Laterality: N/A;    Outpatient Encounter Prescriptions as of 07/12/2014  Medication Sig  . acetaminophen (TYLENOL) 650 MG suppository Place 650 mg rectally every 4 (four) hours as needed.  Marland Kitchen albuterol (PROVENTIL) (2.5 MG/3ML) 0.083% nebulizer solution Take 3 mLs (2.5 mg total) by nebulization 4 (four) times daily.  Marland Kitchen ALPRAZolam (XANAX) 0.5 MG tablet Take 1 tablet (0.5 mg total) by mouth 3 (three) times daily.  Marland Kitchen aspirin 81 MG tablet Take 81 mg by mouth daily.  Marland Kitchen atorvastatin (LIPITOR) 40 MG tablet Take 1 tablet (40 mg total) by mouth daily at 6 PM.  . bisoprolol (ZEBETA) 5 MG tablet Take 0.5 tablets (2.5 mg total) by mouth daily.  . budesonide (PULMICORT) 0.25 MG/2ML nebulizer solution Take 2 mLs (0.25 mg total) by nebulization 2 (two) times daily.  . calcium-vitamin D (OSCAL WITH D) 500-200 MG-UNIT per tablet Take 1 tablet by mouth 2 (two) times daily.    . Cholecalciferol (VITAMIN D3) 1000 UNITS CAPS Take 1 capsule by mouth daily.   Marland Kitchen dextromethorphan-guaiFENesin (MUCINEX DM) 30-600 MG per 12 hr tablet Take 2 tablets by mouth every 12 (twelve) hours.    . diphenoxylate-atropine (LOMOTIL) 2.5-0.025 MG per tablet Take 1 tablet by mouth 4 (four) times daily as needed for diarrhea or loose stools.  . ferrous sulfate 325 (65 FE) MG tablet Take 325 mg by mouth daily with breakfast.   . glucose blood (ONE TOUCH ULTRA TEST) test strip TEST BLOOD SUGAR LEVELS THREE TIMES DAILY  . ibandronate (BONIVA) 150 MG tablet TAKE 1 TABLET EVERY MONTH ON THE SAME DAY  . insulin aspart protamine - aspart (NOVOLOG MIX 70/30 FLEXPEN) (70-30) 100 UNIT/ML FlexPen Inject 0.25 mLs (25 Units total) into the skin 2 (two) times daily with a meal. E11.8  . Insulin Lispro, Human, (HUMALOG [place]) Inject into the skin.  Marland Kitchen ipratropium (ATROVENT) 0.02 % nebulizer solution Take 2.5 mLs (500 mcg total) by nebulization 4 (four) times daily.  . isosorbide mononitrate (IMDUR) 30 MG 24 hr tablet TAKE 1 TABLET DAILY  .  losartan (COZAAR) 50 MG tablet TAKE ONE TABLET BY MOUTH ONCE DAILY  . Multiple Vitamin (MULTIVITAMIN) tablet Take 1 tablet by mouth daily.  . nitroGLYCERIN (NITROSTAT) 0.4 MG SL tablet Place 1 tablet (0.4 mg total) under the tongue every 5 (five) minutes as needed. May repeat x3  . omeprazole (PRILOSEC) 20 MG capsule TAKE ONE CAPSULE BY MOUTH ONCE DAILY 30 MINUTES BEFORE A MEAL  . ONE TOUCH LANCETS MISC Check blood sugars two times daily  . potassium chloride (K-DUR) 10 MEQ tablet Take 1 tablet (10 mEq total) by mouth daily.  . predniSONE (DELTASONE) 10 MG tablet Take 4 tabs daily for 3 days, 3 tabs daily x 3 days, 2 tabs daily for three days, 1 tab daily for three days, then stop  . Probiotic Product (PROBIOTIC DAILY PO) Take 1 tablet by mouth daily.   . salmeterol (SEREVENT) 50 MCG/DOSE diskus inhaler Inhale 1 puff into the lungs 2 (two) times daily.  . sodium chloride (OCEAN) 0.65 % SOLN nasal spray Place 1 spray  into both nostrils 2 (two) times daily as needed for congestion.  . Torsemide (DEMADEX PO) Take 40 mg by mouth 2 (two) times daily.  Lauris Poag INSULIN SYRINGE 30G X 5/16" 0.5 ML MISC USE 3 TIMES A DAY AS DIRECTED  . vitamin B-12 (CYANOCOBALAMIN) 500 MCG tablet Take 500 mcg by mouth daily.   . vitamin C (ASCORBIC ACID) 500 MG tablet Take 500 mg by mouth daily.  Marland Kitchen arformoterol (BROVANA) 15 MCG/2ML NEBU Take 2 mLs (15 mcg total) by nebulization 2 (two) times daily.  . predniSONE (DELTASONE) 10 MG tablet 4 tabs for 2 days, then 3 tabs for 2 days, 2 tabs for 2 days, then 1 tab for 2 days, then stop   No facility-administered encounter medications on file as of 07/12/2014.     Allergies  Allergen Reactions  . Codeine Nausea And Vomiting    vomiting  . Terbutaline Sulfate     REACTION: INTOL to terbutaline    Current Medications, Allergies, Past Medical History, Past Surgical History, Family History, and Social History were reviewed in [employer] record.     Review of Systems        See HPI - all other systems neg except as noted...   The patient complains of dyspnea on exertion, The patient denies anorexia, fever, weight loss, weight gain, vision loss, decreased hearing, hoarseness, chest pain, syncope, prolonged cough, headaches, hemoptysis, abdominal pain, melena, hematochezia, severe indigestion/heartburn, hematuria, incontinence, suspicious skin lesions, transient blindness, depression, unusual weight change, abnormal bleeding, enlarged lymph nodes, and angioedema.    Objective:   Physical Exam   WD, Obese, 74 y/o WF in NAD... Morbidly obese GENERAL:  Alert & oriented; pleasant & cooperative...  HEENT:  [place]/AT, EOM-wnl, PERRLA, EACs-clear, TMs-wnl, NOSE-clear, THROAT-clear & wnl. NECK:  Supple w/ fairROM; no JVD; normal carotid impulses w/o bruits; no thyromegaly or nodules palpated; no lymphadenopathy. CHEST:  Decr BS bilat, few scat rhonchi, without wheezing, rales, or rubs...upper airway psuedo-wheezing HEART:  Regular Rhythm;  gr 1/6 SEM without rubs or gallops detected... ABDOMEN:  Obese w/ panniculus, soft & nontender; normal bowel sounds; no organomegaly or masses detected. EXT: without deformities, mild arthritic changes; no varicose veins/ +venous insuffic/ no edema.  NEURO:no focal neuro deficits... DERM:  no lesion seen...   Assessment & Plan:

## 2014-07-12 NOTE — Assessment & Plan Note (Signed)
Compensated on oxygen  Patient does have underlying sleep apnea and needs C Pap. However, she declines.

## 2014-07-12 NOTE — Assessment & Plan Note (Signed)
Recent MI with cardiomyopathy  Continue follow-up with cardiology as planned

## 2014-07-12 NOTE — Patient Instructions (Addendum)
Stop Serevent  Beign Brovana Neb Twice daily  .  Stop Boniva for now.  Mucinex DM Twice daily  As needed  Cough /congestion  Prednisone taper over next week.  Please contact office for sooner follow up if symptoms do not improve or worsen or seek emergency care  follow up Dr. Kriste BasqueNadel  In 4 weeks and As needed   Chest xray today .

## 2014-07-12 NOTE — Progress Notes (Signed)
Quick Note:  No documented facility in pt's chart  Called spoke with patient's daughter [name] PoundDeborah > advised of results and informed that [facility] is [facility] [address] [place] [facility] and spoke with patient's nurse Burna MortimerWanda, gave verbal order for the Levaquin 500mg  #7, once daily, no refills Burna MortimerWanda unable to schedule appts - was transferred to [place] whom took a message for transportation to call back to schedule 1-2 wk appt with SN w/ cxr ______

## 2014-07-13 ENCOUNTER — Telehealth: Payer: Self-pay | Admitting: Pulmonary Disease

## 2014-07-13 NOTE — Telephone Encounter (Signed)
Called and spoke to pt's daughter, Stanton KidneyDebra. Debra requesting update on FMLA forms. Phone note from 4/27 addressed the same issue but note was closed. Advised Debra call down to medical records as [facility]   [facility]. Thanks.

## 2014-07-21 NOTE — Telephone Encounter (Signed)
Per daughter, paperwork has been received. Nothing further is needed.

## 2014-07-21 NOTE — Telephone Encounter (Signed)
Certified dismissal letter returned on Jul 21, 2014 as undeliverable, unclaimed, return to sender after three attempts by [employer]. Letter placed in another envelope and resent on Jul 21, 2014 as 1st class mail which does not require a signature. DJC

## 2014-07-21 NOTE — Progress Notes (Signed)
Quick Note:  Called and spoke to nurse at facility [phone]). The nurse that schedules appts is out for lunch. Advised we call back to schedule pt appt with SN. Pt currently has appt on 6/1 will see if pt can come earlier. ______

## 2014-07-27 ENCOUNTER — Telehealth: Payer: Self-pay | Admitting: Cardiology

## 2014-07-27 NOTE — Telephone Encounter (Signed)
Referral was sent to [facility].   They called the patient on 07-09-14/07-12-14 5-13 and 07-21-14  and no answer. We have be unable to contact patient.  Thanks you for the referral.   This is the note the referral.  The referral has been closed.

## 2014-07-29 ENCOUNTER — Telehealth: Payer: Self-pay | Admitting: Pulmonary Disease

## 2014-07-29 NOTE — Telephone Encounter (Signed)
Spoke with Mearl LatinSerina from [facility]. Informed her of the message below. She stated that she would schedule pt for a sleep study at [facility] and have pt follow one of their sleep specialist. Nothing further is needed at this time.

## 2014-07-29 NOTE — Telephone Encounter (Signed)
Spoke with Blaine HamperSarina, case manager at [facility]  States that while pt was at the facility she was on bipap 12-7.  Blaine HamperSarina is wanting to get this set up for pt's home, but [facility] was told that a [facility] would need to be done.  States they can either set up a [facility] or can be re-evaluated at her ov on 6/1 to see if she needs bipap.    Dr. Kriste BasqueNadel please advise on how to proceed.  Thanks!

## 2014-07-29 NOTE — Telephone Encounter (Signed)
LMTCB x 1 for Sonic AutomotiveSerina

## 2014-07-29 NOTE — Telephone Encounter (Signed)
Per SN,  1. He needs all records from [facility] to make further recommendations of what pt needs. Call placed and records requested by writer. Formal wriiten request was faxed to facility per facility request. 2. SN states that if pt doesn't care of insurance doesn't pay he can write for a bi-pap but it will not be covered through insurance and pt and pt family will have to pick up the cost.  3. If she wants her insurance to pay she will need to have a sleep study done. Unfortunately, it is over a month waiting time here to get sleep study done. It would benefit pt better to get sleep study near her home and f/u with a sleep specialist there.  Call was placed by writer to case manager, Mearl LatinSerina, asking her to call back to discuss this. Waiting on return call.

## 2014-08-04 ENCOUNTER — Ambulatory Visit (INDEPENDENT_AMBULATORY_CARE_PROVIDER_SITE_OTHER): Payer: PPO | Admitting: Pulmonary Disease

## 2014-08-04 ENCOUNTER — Encounter: Payer: Self-pay | Admitting: Pulmonary Disease

## 2014-08-04 VITALS — BP 104/58 | HR 84 | Temp 97.7°F | Ht 64.0 in | Wt 219.6 lb

## 2014-08-04 DIAGNOSIS — I10 Essential (primary) hypertension: Secondary | ICD-10-CM

## 2014-08-04 DIAGNOSIS — I2510 Atherosclerotic heart disease of native coronary artery without angina pectoris: Secondary | ICD-10-CM

## 2014-08-04 DIAGNOSIS — J9611 Chronic respiratory failure with hypoxia: Secondary | ICD-10-CM

## 2014-08-04 DIAGNOSIS — J449 Chronic obstructive pulmonary disease, unspecified: Secondary | ICD-10-CM | POA: Diagnosis not present

## 2014-08-04 DIAGNOSIS — I5181 Takotsubo syndrome: Secondary | ICD-10-CM

## 2014-08-04 DIAGNOSIS — J4489 Other specified chronic obstructive pulmonary disease: Secondary | ICD-10-CM

## 2014-08-04 DIAGNOSIS — Z9861 Coronary angioplasty status: Secondary | ICD-10-CM

## 2014-08-04 DIAGNOSIS — I214 Non-ST elevation (NSTEMI) myocardial infarction: Secondary | ICD-10-CM

## 2014-08-04 NOTE — Progress Notes (Signed)
Subjective:    Patient ID: [name], female    DOB: 04/10/1940, 74 y.o.   MRN: [id number]  HPI 74 y/o WF here for a follow up visit... [name] has 13 grandchildren & 18 great-grands!  She also has mult med problems including severe COPD; HBP; CAD w/prev MI & PTCA followed by DrMcDowell; VI; Hyperlipidemia; DM on insulin; Obesity; GERD/ constipation; DJD/ osteoporosis; Anxiety...   ~  SEE PREV EPIC NOTES FOR OLDER DATA >>   ~  Jul 16, 2012:  4-5566mo ROV & post hosp check> [name] was eval recently by Cards for CP & an abn Myoview- Cath 5/14 revealed mod diffuse LAD disease w/ 50%proxLAD stenosis w/ a 60-70% in-stent restenosis in the midLAD; RCA was small w/ a 75% prox stenosis; LCx was dominant w/o focal stenosis but w/ mod diffuse calcium; HK of inferoapical wall w/ EF= 35-40% and mild-mod MR (Rec was for continued medical management)... We reviewed the following medical problems during today's office visit >>     HxSinusitis> on MMW, Flonase, OTC antihist prn; c/o chr congestion, no recent infection...    COPD> on O2, NEBS w/ Duoneb/ Budes, Mucinex2Bid; stable w/o exac & denies ch in cough/ sput/ SOB, etc...    HBP> on Aten50, Losar50, Lasix40; labs 5/14 w/ normal lytes & renal; BP= 126/82 & she denies CP, palpit, ch in SOB, edema, etc...    CAD> on ASA81, NTG prn; followed by DrMcDowell- seen 5/14 w/ abn Myoview & subseq Cath- AS ABOVE, med rx...    VI> stable on low sodium diet, Lasix40, elevation, support hose...    Hyperlipid> on Simva80; FLP 5/14 shows TChol 134, TG 227, HDL 33, LDL 74... Advised better low fat diet, exercise, & get wt down...    DM> on 70/30 insulin 30Bid, Metform500Bid; doing well she says; labs today showed BS=160, A1c=7.3 (sl improved- continue same + better diet)...    Obesity> wt is 201# which is down 6# from last visit; we reviewed diet, exercise, wt reduction strategies...    GI- GERD, Constip> on Omep20 & Probiotic; hx gastritis & prepyloric ulcers on EGD 3/12, rec to stay  on the PPI Rx; last colon 2007 by DrJacobs was neg...    DJD> on Lodine400Bid prn, Tramadol50Tid prn, folowed by [facility]...    Osteoporosis> on Boniva150, calcium, MVI, VitD1000; labs 8/13 showed VitD=38; last BMD 5/13 w/ TScore -1.6 in hips...    Neuropathy> on Neurontin300Qhs...    Anxiety> on Xanax0.5mg  prn...    Hx Borderline Anemia> on Fe, B12 103000mcg/d; labs 5/14 showed Hg=13.2, MCV=90, last Fe was 8/13 = 59 (18%sat),  We reviewed prob list, meds, xrays and labs> see below for updates >>   LABS 5/14:  FLP- not at goals w/ TG=227, HDL=33;  Chems- ok x BS=160, A1c=7.3;  CBC- wnl;  TSH=2.09...  ~  November 14, 2012:  19mo ROV & [name] is added-on for URI w/ cough, thick yellow sput, feeling poorly, SOB, wheezing, etc;  We called in Augmentin & rec nasal saline- min improvement but persist symptoms;  Exam shows decr BS 7 end exp wheezing/ rhonchi;  She has underlying COPD, ex-smoker, hx sinusitis, on NEBS, Mucinex;  Rec- Levaquin x10d, Depo80, Dosepak, incr Mucinex to 2Bid + fluids...     BP remains controlled on Aten, Losar, Lasix & measures 134/82;  She denies CP, palpit, edema;  Chol has been stable on Simva80 & she doesn't want to change;  DM controlled w/ 70/30 insulin on 30uBid, hasn't been able  to lose weight stating "I can't starve myself"    We reviewed prob list, meds, xrays and labs> see below for updates >>   ~  February 02, 2013:  2-47mo ROV & [name] has recovered from her bronchitis exac & now back to baseline; she is concerned about her memory & notes that she is under a lot of stress...    Breathing is now stable on her NEBS w/ Albut/ Ipratrop/ Budes, Mucinex, Flonase, MMW...    BP is controlled on Aten50, Losar50, Lasix40 & measures 140/76 today... She denies CP, palpit, ch in SOB, etc on her ASA81 & Imdur30...     Chol is regulated w/ Simva40 Qhs- last FLP 5/14 showed sl elev TG & she is reminded to follow a strict low fat diet 7 work on weight reduction...    DM control is fair on  her Metform500Bid and 70/30 insulin taking 30u Bid; Labs today showed BS=203, A1c= 7.6 & we rec that she incr the insulin to 35u Bid...    She remains on Prilosec20, [id number], Neurontin, Tramadol, Alprazolam as well...  We reviewed prob list, meds, xrays and labs> see below for updates >> she refuses the 2014 Flu vaccine...  LABS 12/14:  chems- ok x BS=203, A1c=7.6 on her Metform500Bid & 70/30 insulin 30uBid=> Rec to incr to 35uBid + diet & exercise...  ~  August 03, 2013:  27mo ROV & [name] has established Primary Care w/ Dr. Nicoletta Ba at [facility];  She had right T10 Shingles 5/15- resolving;  Here for pulm follow up of her severe COPD >>     HxSinusitis> on MMW, Flonase, OTC antihist prn; c/o chr congestion, no recent infection...    COPD (GOLD Stage4)> on O2 (Helios system- 2L/min continuously), NEBS w/ DuonebQid/ BudesBid, Mucinex2Bid; stable w/o exac but at baseline has cough, thick beige sput, no hemoptysis, & chr dyspnea- ok w/ ADLs in general but SOB w/ chores/ housework/ walking in the house, etc...  We reviewed prob list, meds, xrays (3/15 CXR) and labs (3/15)> see below for updates >> REC to continue O2, NEBS, Mucinex & call promptly for any infectious exac etc...  CXR 3/15 showed cardiomeg, COPD/ atelectasis in lingula & LLL, DJD in Tspine & shoulders, NAD...  PFT 6/15 showed FVC=0.81 (28%), FEV1=0.43 (20%), %1sec=53%, mid-flows= 9% predicted... c/w GOLD Stage4 COPD.  ~  February 02, 2014:  27mo ROV & [name] has seen DrMcGowen several times- most recently 01/27/14 for a sick visit & felt to have a COPD exac- treaqted w/ Depo80, Pred taper, & Levaquin;  She states improved , breathing is better w/ min cough, whitish sput, & no f/c/s; she has baseline DOE- no better due to 17# wt gain & she admits to not dieting & not exercising! We reviewed diet, exercise & wt reduction strategies;  We walked her in the office on her O2 at 2L/min (see below);  We reviewed the following medical problems during  today's office visit >>     HxSinusitis> on MMW, Flonase, OTC antihist prn; c/o chr congestion, & recent infection treated by DrMcGowen w/ Levaquin...    COPD (GOLD Stage4)> on O2 (Helios system- 2L/min continuously), NEBS w/ DuonebQid/ BudesBid, Mucinex2Bid; +recent exac as noted & improved on Levaquin & Pred taper; chr dyspnea- ok w/ ADLs in general but SOB w/ chores/ housework/ walking in the house, etc; she has gained wt, not dieting & not exercising- we read her the riot act!    BP is controlled on Aten50,  Losar50, Lasix40 & measures 138/82 today... She denies CP, palpit, ch in SOB, etc on her ASA81 & Imdur30...     Chol is regulated w/ Simva80 Qhs- last FLP 7/15 showed elev TG & she is reminded to follow a strict low fat diet & work on weight reduction...    DM control is fair on her Actos15 and 70/30 insulin taking 30u Bid per Primary Care; Labs 10/15 showed BS=156, A1c= 7.9; she will f/u w/ Primary care soon    She remains on Prilosec20, Boniva150, Neurontin, Lodine/Tramadol, Vicodin, Alprazolam as well...  We reviewed prob list, meds, xrays and labs> see below for updates >> she continues to REFUSE Flu shots and Pneumonia vaccine...  Oxygen saturation on 2L/min at rest= 95% w/ pulse=64/min;  After walking 3 laps her O2sat=93% w/ pulse=84/min... Rec to continue same.    ~  August 04, 2014:  26mo ROV & [name] was [facility] 4/20 - 06/29/14 by [facility] "I just got sick- a little bit of everything"> DCSummary says COPD exac w/ acute on chr resp failure, OSA on CPAP, NSTEMI, cardiomyopathy, DM, HL, RI, etc... 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  We  reviewed the following medical problems during today's office visit >>     OSA> on CPAP but she wasn't using this prior to adm, she will use it regularly & bring machine in for a download at next visit, I cannot find initial sleep study or Sleep Med notes in epic...    HxSinusitis> on MMW, Flonase, OTC antihist prn; c/o chr congestion, & prev infection treated by DrMcGowen w/ Levaquin...    COPD (GOLD Stage4)> on O2 (Helios system- 2L/min continuously), NEBS w/ DuonebQid/ BudesBid, Mucinex2Bid; +recent Hosp w/ acute on chr resp failure as noted & improved on Levaquin & Pred taper (off now); chr dyspnea- ok w/ ADLs in general but SOB w/ chores/ housework/ walking in the house, etc; pCO2 in the 50 range but up to 80 prior to intubation 4/16; she has gained wt, not dieting & not exercising- we read her the riot act!    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    BP is controlled on above adjusted meds & measures 104/58 today... She denies CP, palpit, ch in SOB, etc on her ASA81 & Imdur30...     Chol is regulated w/ Atorva40 now- last FLP 7/15 showed elev TG & she is reminded to follow a strict low fat diet & work on weight reduction, & f/u w/ PCP & Cards...    DM control is poor on her 70/30 insulin taking 25u Bid per Primary Care; Labs 4/16 showed BS=72-338, A1c= 10.2; she will f/u w/ her PCP soon...    Obesity> she diureses to 98 Kg (216#) during her Hosp; now weighs 219.6 (BMI=37) and she desperately needs to lose the weight.Marland KitchenMarland Kitchen  She remains on Prilosec20, [id number], Alprazolam; but off prev Neurontin, Lodine/Tramadol, Vicodin... We reviewed prob list, meds, xrays and  labs> [facility] reviewed...  Uses [employer] for neb meds and supplies, ?CPAP as well? PLAN>>  Continue current regimen, bring all med bottles to every visit, needs to use CPAP nightly & bring machine for check & download if poss...          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 Rx'd w/ MUCINEX, Nasal Saline, FLONASE...   ~  2/11:  c/o sinusitis & AUGMENTIN perscribed. ~  9/13:  ER visit w/ jaw pain- had wisdom tooth extracted 6/13 by DrLewis in [place]; CT showed socket left by right mandib molar extraction w/ inflamm; given PenVK, Vicodin, told f/u w/ dentist...  COPD (ICD-496) - she has COPD and is an ex-smoker... controlled on NEBULIZER w/ Duoneb Qid + Budesonide Bid, MUCINEX, etc... she has end stage disease w/ severe dyspnea w/ ADLs and no change over the last yr... ~  baseline CXR = COPD, incr heart size, NAD. ~ CT Chest 2/02 showed COPD, Atx, sl scarring, NAD. ~  bronchoscopy 3/02 w/ mucous plugging, no endobr lesions. ~  PFT's in 12/07 showed FVC=0.81 (27%), FEV1=0.54 (22%), %1sec=67, mid-flows=13%pred. ~  CXR 1/10 showed chr changes, cardiomeg, lingular atx. ~  CXR 2/11 showed cardiomeg, bibasilar scarring, NAD. ~  CXR 2/12 showed cardiomeg, COPD,scarring left base, NAD. ~  CXR 5/14 showed cardiomeg, mild basilar atx/ flattened diaph/ NAD, DJD in Tspine, s/p chole... ~  9/14:  Presents w/ URI & AB exac- unresponsive to Augmentin called in, & rec Levaquin, Depo, Dosepak, Mucinex, Fluids, etc... ~  CXR 3/15 showed cardiomeg, COPD/ atelectasis in lingula & LLL, DJD in Tspine & shoulders, NAD... ~  6/15: on O2 (Helios- 2L/min continuously), NEBS w/ DuonebQid/ BudesBid, Mucinex2Bid; stable w/o exac but at baseline has cough, thick beige sput, no hemoptysis, & chr dyspnea- ok w/ ADLs in general but SOB w/ chores/ housework/ walking in the house, etc...  ~  PFT 6/15 showed FVC=0.81 (28%), FEV1=0.43 (20%),  %1sec=53%, mid-flows= 9% predicted... c/w GOLD Stage4 COPD ~  12/15: she had a recent infectious exac treated by DrMcGowen w/ Levaquin & Pred; she is improved & approaching baseline, however she has gained 17# & not on diet & not exercising; we discussed need for diet, exercise & wt reduction; she will finish the Levaquin & Pred taper and continue the O2, NEBS, Mucinex, etc...  ~  12/15> Oxygen saturation on 2L/min at rest= 95% w/ pulse=64/min;  After walking 3 laps her O2sat=93% w/ pulse=84/min... Rec to continue same. 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     SHE HAS ESTABLISHED PRIMARY CARE AT [facility]- DrMcGowen >>   HYPERTENSION (ICD-401.9) - on ATENOLOL 50mg /d, LOSARTAN 50mg /d, LASIX 40mg /d... BP=126/80 and tol well... denies HA, visual changes, CP, palipit, dizziness, syncope, change in dyspnea, edema, etc.  CORONARY ARTERY DISEASE (ICD-414.00) - on ASA 81mg /d &  Imdur30... hx of CAD w/ prev MI in 2000, and subseq PTCA's in 2000, & 2001... 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  VENOUS INSUFFICIENCY (ICD-459.81) - low sodium diet, elevation, & Lasix 40-80mg /d... states she "allergic" to TED hose! and refuses to wear them...   HYPERLIPIDEMIA (ICD-272.4) - ZOCOR 80 changed to  ATORVA40 in hosp 4/16 + FISH OIL /d ... she has been counselled on diet & exercise but unsuccessful...  DM (ICD-250.00) - HUMULIN 70/30- now taking 25uAM & 25uPM she says (off Actos due to cardiomyopathy) but control is poor w/ A1c>10; she has f/u w/ PCP soon...  OBESITY (ICD-278.00) - weight = 219# (BMI=37); we discussed diet + exercise program required to lose weight!  GERD (ICD-530.81) - on PPI therapy (changed 2/10 per request to OMEPRAZOLE20mg /d)... ~  [facility] 3/12 for COPD exac & was anemic w/ heme pos stool & GIB; EGD by DrJacobs w/ gastritis & prepyloric ulcers, +HPylori- treated & improved.  CONSTIPATION (ICD-564.00) - last colonoscopy was 11/07 by DrJacobs and negative...  DEGENERATIVE JOINT DISEASE (ICD-715.90) - on Vicodin, Trmaadol, Neurontin; prev CSpine films w/ spondy and biforaminal narrowing... LSSpine films w/ DDD and osteopenia...   OSTEOPOROSIS (ICD-733.00) - on BONIVA /mo, Calcium, Vits...   ANXIETY (ICD-300.00) - uses Alprazolam 0.5mg  as needed...  Hx ANEMIA, MILD (ICD-285.9)  Health Maintenance - she hasn't seen GYN in yrs and is asked to re-establish... prev refused Mammograms due to the discomfort & bruising- she will discuss w/ Gyn & get a follow up BMD as well...   Past Surgical History  Procedure Laterality Date  . Total abdominal hysterectomy      DUB (non-malignant reasons).  No hx of abnormal pap smears.  . Shoulder open rotator cuff repair Right   . Cataract extraction w/phaco Left 06/30/2012    Procedure: CATARACT EXTRACTION PHACO AND INTRAOCULAR LENS PLACEMENT (IOC);  Surgeon: Gemma Payor, MD;  Location: [facility];  Service: Ophthalmology;  Laterality: Left;  CDE:12.32  . Transthoracic echocardiogram  06/2013    EF 40%, + WMA  . Alveoloplasty  10/1999    [name] 07/19/2010 Surgical extraction of root segments #21, 22 and 27 with alveoloplasty.  . Cardiac catheterization  07/2012    + progressive CAD but no lesions to intervene upon, EF 65%; med mgmt  continued  . Coronary angioplasty with stent placement      PTCA/BMS LAD 2000, PTCA/brachytherapy LAD 2001, LVEF 55%   . Coronary angioplasty with stent placement  2000; 2001; 2002    [name] 05/18/2010  . Tonsillectomy  1949  . Laparoscopic cholecystectomy    . Cataract extraction w/ intraocular lens implant Right 2015?  Marland Kitchen Left heart catheterization with coronary angiogram N/A 06/25/2014    Procedure: LEFT HEART CATHETERIZATION WITH CORONARY ANGIOGRAM;  Surgeon: Lyn Records, MD;  Location: [facility] CATH LAB;  Service: Cardiovascular;  Laterality: N/A;    Outpatient Encounter Prescriptions as of 08/04/2014  Medication Sig  . acetaminophen (TYLENOL) 650 MG suppository Place 650 mg rectally every 4 (four) hours as needed.  Marland Kitchen albuterol (PROVENTIL) (2.5 MG/3ML) 0.083% nebulizer solution Take 3 mLs (2.5 mg total) by nebulization 4 (four) times daily.  Marland Kitchen ALPRAZolam (XANAX) 0.5 MG tablet Take 1 tablet (0.5 mg total) by mouth 3 (three) times daily.  Marland Kitchen arformoterol (BROVANA) 15 MCG/2ML  NEBU Take 2 mLs (15 mcg total) by nebulization 2 (two) times daily.  Marland Kitchen aspirin 81 MG tablet Take 81 mg by mouth daily.  Marland Kitchen atorvastatin (LIPITOR) 40 MG tablet Take 1 tablet (40 mg total) by mouth daily at 6 PM.  . bisoprolol (ZEBETA) 5 MG tablet Take 0.5 tablets (2.5 mg total) by mouth daily.  . budesonide (PULMICORT) 0.25 MG/2ML nebulizer solution Take 2 mLs (0.25 mg total) by nebulization 2 (two) times daily.  . calcium-vitamin D (OSCAL WITH D) 500-200 MG-UNIT per tablet Take 1 tablet by mouth 2 (two) times daily.    . Cholecalciferol (VITAMIN D3) 1000 UNITS CAPS Take 1 capsule by mouth daily.   Marland Kitchen dextromethorphan-guaiFENesin (MUCINEX DM) 30-600 MG per 12 hr tablet Take 2 tablets by mouth every 12 (twelve) hours.    . diphenoxylate-atropine (LOMOTIL) 2.5-0.025 MG per tablet Take 1 tablet by mouth 4 (four) times daily as needed for diarrhea or loose stools.  Marland Kitchen doxycycline (VIBRAMYCIN) 100 MG capsule Take 100 mg by mouth 2  (two) times daily.  . ferrous sulfate 325 (65 FE) MG tablet Take 325 mg by mouth daily with breakfast.   . glucose blood (ONE TOUCH ULTRA TEST) test strip TEST BLOOD SUGAR LEVELS THREE TIMES DAILY  . ibandronate (BONIVA) 150 MG tablet TAKE 1 TABLET EVERY MONTH ON THE SAME DAY  . insulin aspart protamine - aspart (NOVOLOG MIX 70/30 FLEXPEN) (70-30) 100 UNIT/ML FlexPen Inject 0.25 mLs (25 Units total) into the skin 2 (two) times daily with a meal. E11.8  . Insulin Lispro, Human, (HUMALOG [place]) Inject into the skin.  Marland Kitchen ipratropium (ATROVENT) 0.02 % nebulizer solution Take 2.5 mLs (500 mcg total) by nebulization 4 (four) times daily.  . isosorbide mononitrate (IMDUR) 30 MG 24 hr tablet TAKE 1 TABLET DAILY  . losartan (COZAAR) 50 MG tablet TAKE ONE TABLET BY MOUTH ONCE DAILY  . Multiple Vitamin (MULTIVITAMIN) tablet Take 1 tablet by mouth daily.  . nitroGLYCERIN (NITROSTAT) 0.4 MG SL tablet Place 1 tablet (0.4 mg total) under the tongue every 5 (five) minutes as needed. May repeat x3  . omeprazole (PRILOSEC) 20 MG capsule TAKE ONE CAPSULE BY MOUTH ONCE DAILY 30 MINUTES BEFORE A MEAL  . ONE TOUCH LANCETS MISC Check blood sugars two times daily  . potassium chloride (K-DUR) 10 MEQ tablet Take 1 tablet (10 mEq total) by mouth daily.  . Probiotic Product (PROBIOTIC DAILY PO) Take 1 tablet by mouth daily.   . sodium chloride (OCEAN) 0.65 % SOLN nasal spray Place 1 spray into both nostrils 2 (two) times daily as needed for congestion.  . Torsemide (DEMADEX PO) Take 40 mg by mouth 2 (two) times daily.  Lauris Poag INSULIN SYRINGE 30G X 5/16" 0.5 ML MISC USE 3 TIMES A DAY AS DIRECTED  . vitamin B-12 (CYANOCOBALAMIN) 500 MCG tablet Take 500 mcg by mouth daily.   . vitamin C (ASCORBIC ACID) 500 MG tablet Take 500 mg by mouth daily.  . [DISCONTINUED] predniSONE (DELTASONE) 10 MG tablet Take 4 tabs daily for 3 days, 3 tabs daily x 3 days, 2 tabs daily for three days, 1 tab daily for three days, then stop  .  [DISCONTINUED] predniSONE (DELTASONE) 10 MG tablet 4 tabs for 2 days, then 3 tabs for 2 days, 2 tabs for 2 days, then 1 tab for 2 days, then stop   No facility-administered encounter medications on file as of 08/04/2014.     Allergies  Allergen Reactions  . Codeine Nausea And  Vomiting    vomiting  . Terbutaline Sulfate     REACTION: INTOL to terbutaline    Current Medications, Allergies, Past Medical History, Past Surgical History, Family History, and Social History were reviewed in [employer] record.    Review of Systems         See HPI - all other systems neg except as noted...   The patient complains of dyspnea on exertion, peripheral edema, muscle weakness, and difficulty walking.  The patient denies anorexia, fever, weight loss, weight gain, vision loss, decreased hearing, hoarseness, chest pain, syncope, prolonged cough, headaches, hemoptysis, abdominal pain, melena, hematochezia, severe indigestion/heartburn, hematuria, incontinence, suspicious skin lesions, transient blindness, depression, unusual weight change, abnormal bleeding, enlarged lymph nodes, and angioedema.    Objective:   Physical Exam    WD, Obese, 74 y/o WF in NAD... GENERAL:  Alert & oriented; pleasant & cooperative...  HEENT:  [place]/AT, EOM-wnl, PERRLA, EACs-clear, TMs-wnl, NOSE-clear, THROAT-clear & wnl. NECK:  Supple w/ fairROM; no JVD; normal carotid impulses w/o bruits; no thyromegaly or nodules palpated; no lymphadenopathy. CHEST:  Decr BS bilat, few scat rhonchi, without wheezing, rales, or rubs...upper airway psuedo-wheezing HEART:  Regular Rhythm;  gr 1/6 SEM without rubs or gallops detected... ABDOMEN:  Obese w/ panniculus, soft & nontender; normal bowel sounds; no organomegaly or masses detected. EXT: without deformities, mild arthritic changes; no varicose veins/ +venous insuffic/ tr edema. Sl tender on palpation over lumbar area & siatic notch... NEURO:  CN's intact; no focal  neuro deficits... DERM:  no lesion seen...  RADIOLOGY DATA:  Reviewed in the EPIC EMR & discussed w/ the patient...  LABORATORY DATA:  Reviewed in the EPIC EMR & discussed w/ the patient...   Assessment & Plan:    COPD>  continues home Oxygen, Nebulizer, Mucinex, & Fluids;  Recent Hosp as above, now back on maint meds... OSA>  Can't find old data, sleep med notes, any download data, etc... She was not using CPAP for yrs prior to 4/16 [facility]... She will bring machine, may need to start over...  HBP>  Good control on Atenolol50, Losartan50, Furosemide40; discussed diet, must get wt down, no sodium... CAD>  Followed by DrMcDowell in [place] office- recent cath reviewed, no option for surg or intervention, continue medical management... Takotsubo Cardiomyopathy> see above (4/16 Hosp)  HYPERLIPID>  On Atorva40 now + Fish Oil; needs better diet & get wt down!  DM>  Control remains poor as she has been unable to diet effectively, lose weight, etc; now on 70/30 insulin taking 30u Bid & A1c is 7.6 therefore rec incr to 35u Bid...  OBESITY>  Weight reduction is key to improvement in all areas...  GI> GERD, Ulcers, +HPylori, Constip>  Improved after HPylori Rx & back on Omep 20mg /d...  DJD, LBP, etc>  Lumbar XRay reviewed, trial Tramadol, Robaxin, rest/ heat/ etc... Refer to Ortho for further eval & Rx...  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  Anxiety>  Alprazolam for prn use- refilled...  Anemia>  On Fe daily and Hg is improved (s/p hosp w/ GIB)...   Patient's Medications  New Prescriptions   No medications on file  Previous Medications   ACETAMINOPHEN (TYLENOL) 650 MG SUPPOSITORY    Place 650 mg rectally every 4 (four) hours as needed.   ALBUTEROL (PROVENTIL) (2.5 MG/3ML) 0.083% NEBULIZER SOLUTION    Take 3 mLs (2.5 mg total) by nebulization 4 (four) times daily.   ALPRAZOLAM (XANAX) 0.5  MG TABLET    Take 1 tablet (0.5 mg total) by mouth 3  (three) times daily.   ARFORMOTEROL (BROVANA) 15 MCG/2ML NEBU    Take 2 mLs (15 mcg total) by nebulization 2 (two) times daily.   ASPIRIN 81 MG TABLET    Take 81 mg by mouth daily.   ATORVASTATIN (LIPITOR) 40 MG TABLET    Take 1 tablet (40 mg total) by mouth daily at 6 PM.   BISOPROLOL (ZEBETA) 5 MG TABLET    Take 0.5 tablets (2.5 mg total) by mouth daily.   BUDESONIDE (PULMICORT) 0.25 MG/2ML NEBULIZER SOLUTION    Take 2 mLs (0.25 mg total) by nebulization 2 (two) times daily.   CALCIUM-VITAMIN D (OSCAL WITH D) 500-200 MG-UNIT PER TABLET    Take 1 tablet by mouth 2 (two) times daily.     CHOLECALCIFEROL (VITAMIN D3) 1000 UNITS CAPS    Take 1 capsule by mouth daily.    DEXTROMETHORPHAN-GUAIFENESIN (MUCINEX DM) 30-600 MG PER 12 HR TABLET    Take 2 tablets by mouth every 12 (twelve) hours.     DIPHENOXYLATE-ATROPINE (LOMOTIL) 2.5-0.025 MG PER TABLET    Take 1 tablet by mouth 4 (four) times daily as needed for diarrhea or loose stools.   DOXYCYCLINE (VIBRAMYCIN) 100 MG CAPSULE    Take 100 mg by mouth 2 (two) times daily.   FERROUS SULFATE 325 (65 FE) MG TABLET    Take 325 mg by mouth daily with breakfast.    GLUCOSE BLOOD (ONE TOUCH ULTRA TEST) TEST STRIP    TEST BLOOD SUGAR LEVELS THREE TIMES DAILY   IBANDRONATE (BONIVA) 150 MG TABLET    TAKE 1 TABLET EVERY MONTH ON THE SAME DAY   INSULIN ASPART PROTAMINE - ASPART (NOVOLOG MIX 70/30 FLEXPEN) (70-30) 100 UNIT/ML FLEXPEN    Inject 0.25 mLs (25 Units total) into the skin 2 (two) times daily with a meal. E11.8   INSULIN LISPRO, HUMAN, (HUMALOG [place])    Inject into the skin.   IPRATROPIUM (ATROVENT) 0.02 % NEBULIZER SOLUTION    Take 2.5 mLs (500 mcg total) by nebulization 4 (four) times daily.   ISOSORBIDE MONONITRATE (IMDUR) 30 MG 24 HR TABLET    TAKE 1 TABLET DAILY   LOSARTAN (COZAAR) 50 MG TABLET    TAKE ONE TABLET BY MOUTH ONCE DAILY   MULTIPLE VITAMIN (MULTIVITAMIN) TABLET    Take 1 tablet by mouth daily.   NITROGLYCERIN (NITROSTAT) 0.4 MG SL TABLET     Place 1 tablet (0.4 mg total) under the tongue every 5 (five) minutes as needed. May repeat x3   OMEPRAZOLE (PRILOSEC) 20 MG CAPSULE    TAKE ONE CAPSULE BY MOUTH ONCE DAILY 30 MINUTES BEFORE A MEAL   ONE TOUCH LANCETS MISC    Check blood sugars two times daily   POTASSIUM CHLORIDE (K-DUR) 10 MEQ TABLET    Take 1 tablet (10 mEq total) by mouth daily.   PROBIOTIC PRODUCT (PROBIOTIC DAILY PO)    Take 1 tablet by mouth daily.    SODIUM CHLORIDE (OCEAN) 0.65 % SOLN NASAL SPRAY    Place 1 spray into both nostrils 2 (two) times daily as needed for congestion.   TORSEMIDE (DEMADEX PO)    Take 40 mg by mouth 2 (two) times daily.   TRUEPLUS INSULIN SYRINGE 30G X 5/16" 0.5 ML MISC    USE 3 TIMES A DAY AS DIRECTED   VITAMIN B-12 (CYANOCOBALAMIN) 500 MCG TABLET    Take 500 mcg by mouth daily.  VITAMIN C (ASCORBIC ACID) 500 MG TABLET    Take 500 mg by mouth daily.  Modified Medications   No medications on file  Discontinued Medications   PREDNISONE (DELTASONE) 10 MG TABLET    Take 4 tabs daily for 3 days, 3 tabs daily x 3 days, 2 tabs daily for three days, 1 tab daily for three days, then stop   PREDNISONE (DELTASONE) 10 MG TABLET    4 tabs for 2 days, then 3 tabs for 2 days, 2 tabs for 2 days, then 1 tab for 2 days, then stop

## 2014-08-04 NOTE — Patient Instructions (Signed)
Today we updated your med list in our EPIC system...    Continue your current medications the same...  Start your home physical therapy rehab program...  Continue same meds and breathing treatments...  Concentrate on good deep breaths...  Call for any questions...  Let's plan a follow up visit in 1 month.Marland Kitchen..Marland Kitchen

## 2014-08-05 ENCOUNTER — Encounter: Payer: Self-pay | Admitting: *Deleted

## 2014-08-06 NOTE — Progress Notes (Signed)
Quick Note:  Pt was seen by SN on /. Nothing further needed at this time. Will sign off. ______

## 2014-08-24 ENCOUNTER — Other Ambulatory Visit: Payer: Self-pay | Admitting: Family Medicine

## 2014-08-31 ENCOUNTER — Ambulatory Visit (INDEPENDENT_AMBULATORY_CARE_PROVIDER_SITE_OTHER): Payer: PPO | Admitting: Pulmonary Disease

## 2014-08-31 ENCOUNTER — Encounter: Payer: Self-pay | Admitting: Pulmonary Disease

## 2014-08-31 VITALS — BP 146/80 | HR 78 | Temp 97.7°F | Ht 64.0 in | Wt 220.4 lb

## 2014-08-31 DIAGNOSIS — G4733 Obstructive sleep apnea (adult) (pediatric): Secondary | ICD-10-CM | POA: Insufficient documentation

## 2014-08-31 DIAGNOSIS — J449 Chronic obstructive pulmonary disease, unspecified: Secondary | ICD-10-CM

## 2014-08-31 DIAGNOSIS — I5181 Takotsubo syndrome: Secondary | ICD-10-CM

## 2014-08-31 DIAGNOSIS — J9611 Chronic respiratory failure with hypoxia: Secondary | ICD-10-CM | POA: Diagnosis not present

## 2014-08-31 DIAGNOSIS — E669 Obesity, unspecified: Secondary | ICD-10-CM

## 2014-08-31 DIAGNOSIS — I214 Non-ST elevation (NSTEMI) myocardial infarction: Secondary | ICD-10-CM

## 2014-08-31 DIAGNOSIS — I10 Essential (primary) hypertension: Secondary | ICD-10-CM | POA: Diagnosis not present

## 2014-08-31 DIAGNOSIS — Z9861 Coronary angioplasty status: Secondary | ICD-10-CM

## 2014-08-31 DIAGNOSIS — I2510 Atherosclerotic heart disease of native coronary artery without angina pectoris: Secondary | ICD-10-CM

## 2014-08-31 NOTE — Patient Instructions (Signed)
Today we updated your med list in our EPIC system...    Continue your current medications the same...  Be sure to continue your breathing treatments regularly every day....  Call DrMcDowell's office for a cardiac follow up in July...  Let's plan a follow up visit in 6-8weeks, sooner if needed for breathing problems.Marland Kitchen..Marland Kitchen

## 2014-08-31 NOTE — Progress Notes (Signed)
Subjective:    Patient ID: [name], female    DOB: 08/03/1940, 74 y.o.   MRN: [id number]  HPI 74 y/o WF here for a follow up visit... [name] has 13 grandchildren & 18 great-grands!  She also has mult med problems including severe COPD; HBP; CAD w/prev MI & PTCA followed by DrMcDowell; VI; Hyperlipidemia; DM on insulin; Obesity; GERD/ constipation; DJD/ osteoporosis; Anxiety...   ~  SEE PREV EPIC NOTES FOR OLDER DATA >>   ~  Jul 16, 2012:  4-4429mo ROV & post hosp check> [name] was eval recently by Cards for CP & an abn Myoview- Cath 5/14 revealed mod diffuse LAD disease w/ 50%proxLAD stenosis w/ a 60-70% in-stent restenosis in the midLAD; RCA was small w/ a 75% prox stenosis; LCx was dominant w/o focal stenosis but w/ mod diffuse calcium; HK of inferoapical wall w/ EF= 35-40% and mild-mod MR (Rec was for continued medical management)... We reviewed the following medical problems during today's office visit >>     HxSinusitis> on MMW, Flonase, OTC antihist prn; c/o chr congestion, no recent infection...    COPD> on O2, NEBS w/ Duoneb/ Budes, Mucinex2Bid; stable w/o exac & denies ch in cough/ sput/ SOB, etc...    HBP> on Aten50, Losar50, Lasix40; labs 5/14 w/ normal lytes & renal; BP= 126/82 & she denies CP, palpit, ch in SOB, edema, etc...    CAD> on ASA81, NTG prn; followed by DrMcDowell- seen 5/14 w/ abn Myoview & subseq Cath- AS ABOVE, med rx...    VI> stable on low sodium diet, Lasix40, elevation, support hose...    Hyperlipid> on Simva80; FLP 5/14 shows TChol 134, TG 227, HDL 33, LDL 74... Advised better low fat diet, exercise, & get wt down...    DM> on 70/30 insulin 30Bid, Metform500Bid; doing well she says; labs today showed BS=160, A1c=7.3 (sl improved- continue same + better diet)...    Obesity> wt is 201# which is down 6# from last visit; we reviewed diet, exercise, wt reduction strategies...    GI- GERD, Constip> on Omep20 & Probiotic; hx gastritis & prepyloric ulcers on EGD 3/12, rec to stay  on the PPI Rx; last colon 2007 by DrJacobs was neg...    DJD> on Lodine400Bid prn, Tramadol50Tid prn, folowed by [facility]...    Osteoporosis> on Boniva150, calcium, MVI, VitD1000; labs 8/13 showed VitD=38; last BMD 5/13 w/ TScore -1.6 in hips...    Neuropathy> on Neurontin300Qhs...    Anxiety> on Xanax0.5mg  prn...    Hx Borderline Anemia> on Fe, B12 104900mcg/d; labs 5/14 showed Hg=13.2, MCV=90, last Fe was 8/13 = 59 (18%sat),  We reviewed prob list, meds, xrays and labs> see below for updates >>   LABS 5/14:  FLP- not at goals w/ TG=227, HDL=33;  Chems- ok x BS=160, A1c=7.3;  CBC- wnl;  TSH=2.09...  ~  November 14, 2012:  34mo ROV & [name] is added-on for URI w/ cough, thick yellow sput, feeling poorly, SOB, wheezing, etc;  We called in Augmentin & rec nasal saline- min improvement but persist symptoms;  Exam shows decr BS 7 end exp wheezing/ rhonchi;  She has underlying COPD, ex-smoker, hx sinusitis, on NEBS, Mucinex;  Rec- Levaquin x10d, Depo80, Dosepak, incr Mucinex to 2Bid + fluids...     BP remains controlled on Aten, Losar, Lasix & measures 134/82;  She denies CP, palpit, edema;  Chol has been stable on Simva80 & she doesn't want to change;  DM controlled w/ 70/30 insulin on 30uBid, hasn't been able  to lose weight stating "I can't starve myself"    We reviewed prob list, meds, xrays and labs> see below for updates >>   ~  February 02, 2013:  2-47mo ROV & [name] has recovered from her bronchitis exac & now back to baseline; she is concerned about her memory & notes that she is under a lot of stress...    Breathing is now stable on her NEBS w/ Albut/ Ipratrop/ Budes, Mucinex, Flonase, MMW...    BP is controlled on Aten50, Losar50, Lasix40 & measures 140/76 today... She denies CP, palpit, ch in SOB, etc on her ASA81 & Imdur30...     Chol is regulated w/ Simva40 Qhs- last FLP 5/14 showed sl elev TG & she is reminded to follow a strict low fat diet 7 work on weight reduction...    DM control is fair on  her Metform500Bid and 70/30 insulin taking 30u Bid; Labs today showed BS=203, A1c= 7.6 & we rec that she incr the insulin to 35u Bid...    She remains on Prilosec20, [id number], Neurontin, Tramadol, Alprazolam as well...  We reviewed prob list, meds, xrays and labs> see below for updates >> she refuses the 2014 Flu vaccine...  LABS 12/14:  chems- ok x BS=203, A1c=7.6 on her Metform500Bid & 70/30 insulin 30uBid=> Rec to incr to 35uBid + diet & exercise...  ~  August 03, 2013:  27mo ROV & [name] has established Primary Care w/ Dr. Nicoletta Ba at [facility];  She had right T10 Shingles 5/15- resolving;  Here for pulm follow up of her severe COPD >>     HxSinusitis> on MMW, Flonase, OTC antihist prn; c/o chr congestion, no recent infection...    COPD (GOLD Stage4)> on O2 (Helios system- 2L/min continuously), NEBS w/ DuonebQid/ BudesBid, Mucinex2Bid; stable w/o exac but at baseline has cough, thick beige sput, no hemoptysis, & chr dyspnea- ok w/ ADLs in general but SOB w/ chores/ housework/ walking in the house, etc...  We reviewed prob list, meds, xrays (3/15 CXR) and labs (3/15)> see below for updates >> REC to continue O2, NEBS, Mucinex & call promptly for any infectious exac etc...  CXR 3/15 showed cardiomeg, COPD/ atelectasis in lingula & LLL, DJD in Tspine & shoulders, NAD...  PFT 6/15 showed FVC=0.81 (28%), FEV1=0.43 (20%), %1sec=53%, mid-flows= 9% predicted... c/w GOLD Stage4 COPD.  ~  February 02, 2014:  27mo ROV & [name] has seen DrMcGowen several times- most recently 01/27/14 for a sick visit & felt to have a COPD exac- treaqted w/ Depo80, Pred taper, & Levaquin;  She states improved , breathing is better w/ min cough, whitish sput, & no f/c/s; she has baseline DOE- no better due to 17# wt gain & she admits to not dieting & not exercising! We reviewed diet, exercise & wt reduction strategies;  We walked her in the office on her O2 at 2L/min (see below);  We reviewed the following medical problems during  today's office visit >>     HxSinusitis> on MMW, Flonase, OTC antihist prn; c/o chr congestion, & recent infection treated by DrMcGowen w/ Levaquin...    COPD (GOLD Stage4)> on O2 (Helios system- 2L/min continuously), NEBS w/ DuonebQid/ BudesBid, Mucinex2Bid; +recent exac as noted & improved on Levaquin & Pred taper; chr dyspnea- ok w/ ADLs in general but SOB w/ chores/ housework/ walking in the house, etc; she has gained wt, not dieting & not exercising- we read her the riot act!    BP is controlled on Aten50,  Losar50, Lasix40 & measures 138/82 today... She denies CP, palpit, ch in SOB, etc on her ASA81 & Imdur30...     Chol is regulated w/ Simva80 Qhs- last FLP 7/15 showed elev TG & she is reminded to follow a strict low fat diet & work on weight reduction...    DM control is fair on her Actos15 and 70/30 insulin taking 30u Bid per Primary Care; Labs 10/15 showed BS=156, A1c= 7.9; she will f/u w/ Primary care soon    She remains on Prilosec20, Boniva150, Neurontin, Lodine/Tramadol, Vicodin, Alprazolam as well...  We reviewed prob list, meds, xrays and labs> see below for updates >> she continues to REFUSE Flu shots and Pneumonia vaccine...  Oxygen saturation on 2L/min at rest= 95% w/ pulse=64/min;  After walking 3 laps her O2sat=93% w/ pulse=84/min... Rec to continue same.    ~  August 04, 2014:  26mo ROV & [name] was [facility] 4/20 - 06/29/14 by [facility] "I just got sick- a little bit of everything"> DCSummary says COPD exac w/ acute on chr resp failure, OSA on CPAP, NSTEMI, cardiomyopathy, DM, HL, RI, etc... 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  We  reviewed the following medical problems during today's office visit >>     OSA> on CPAP but she wasn't using this prior to adm, she will use it regularly & bring machine in for a download at next visit, I cannot find initial sleep study or Sleep Med notes in epic...    HxSinusitis> on MMW, Flonase, OTC antihist prn; c/o chr congestion, & prev infection treated by DrMcGowen w/ Levaquin...    COPD (GOLD Stage4)> on O2 (Helios system- 2L/min continuously), NEBS w/ DuonebQid/ BudesBid, Mucinex2Bid; +recent Hosp w/ acute on chr resp failure as noted & improved on Levaquin & Pred taper (off now); chr dyspnea- ok w/ ADLs in general but SOB w/ chores/ housework/ walking in the house, etc; pCO2 in the 50 range but up to 80 prior to intubation 4/16; she has gained wt, not dieting & not exercising- we read her the riot act!    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    BP is controlled on above adjusted meds & measures 104/58 today... She denies CP, palpit, ch in SOB, etc on her ASA81 & Imdur30...     Chol is regulated w/ Atorva40 now- last FLP 7/15 showed elev TG & she is reminded to follow a strict low fat diet & work on weight reduction, & f/u w/ PCP & Cards...    DM control is poor on her 70/30 insulin taking 25u Bid per Primary Care; Labs 4/16 showed BS=72-338, A1c= 10.2; she will f/u w/ her PCP soon...    Obesity> she diureses to 98 Kg (216#) during her Hosp; now weighs 219.6 (BMI=37) and she desperately needs to lose the weight.Marland KitchenMarland Kitchen  She remains on Prilosec20, [id number], Alprazolam; but off prev Neurontin, Lodine/Tramadol, Vicodin... We reviewed prob list, meds, xrays and  labs> [facility] reviewed...  Uses [employer] for neb meds and supplies, ?CPAP as well? PLAN>>  Continue current regimen, bring all med bottles to every visit, needs to use CPAP nightly & bring machine for check & download if poss...   ~  August 31, 2014:  4wk ROV & [name] did not bring med list or bottles to today's visit; furthermore she tells me that she refused the CPAP- doesn't like the mask/ machine/ etc and refuses to work w/ Sleep Med to help tolerabliity/ interface problems, etc... She doesn't know her meds and is again asked to keep accurate med list handy w/ any changes from her diff providers...    OSA> she refuses CPAP or Sleep Med consult to help w/ tolerability...    HxSinusitis> on MMW, Flonase, OTC antihist prn; c/o chr congestion, & prev infection treated by DrMcGowen w/ Levaquin...    COPD (GOLD Stage4)> on O2 (Helios system- 2L/min continuously), NEBS w/ DuonebQid/ BudesBid, Mucinex2Bid; +recent Hosp w/ acute on chr resp failure as noted & improved on Levaquin & Pred taper (off now); chr dyspnea- ok w/ ADLs in general but SOB w/ chores/ housework/ walking in the house, etc; pCO2 in the 50 range but up to 80 prior to intubation 4/16; she has gained wt up to 220#, not dieting & not exercising- we begged her to get on diet, increase exercise, and get wt down...    She is overdue for [facility] f/u & she will call DrMcDowell's office to set this up; her PCP changed Demadex to Furosemide 40mg /d... We reviewed prob list, meds, xrays and labs>  PLAN>>  Continue current regimen & call for questions; reminded to bring all med bottles to each & every doctor visit; needs to incr her exercise program & get wt down! she will call for [facility] f/u appt & see me back in 6-8wks...         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 Rx'd w/ MUCINEX, Nasal Saline, FLONASE...   ~  2/11:  c/o sinusitis & AUGMENTIN perscribed. ~  9/13:  ER visit  w/ jaw pain- had wisdom tooth extracted 6/13 by DrLewis in [place]; CT showed socket left by right mandib molar extraction w/ inflamm; given PenVK, Vicodin, told f/u w/ dentist...  COPD (ICD-496) - she has COPD and is an ex-smoker... controlled on NEBULIZER w/ Duoneb Qid + Budesonide Bid, MUCINEX, etc... she has end stage disease w/ severe dyspnea w/ ADLs and no change over the last yr... ~  baseline CXR = COPD, incr heart size, NAD. ~ CT Chest 2/02 showed COPD, Atx, sl scarring, NAD. ~  bronchoscopy 3/02 w/ mucous plugging, no endobr lesions. ~  PFT's in 12/07 showed FVC=0.81 (27%), FEV1=0.54 (22%), %1sec=67, mid-flows=13%pred. ~  CXR 1/10 showed chr changes, cardiomeg, lingular atx. ~  CXR 2/11 showed cardiomeg, bibasilar scarring, NAD. ~  CXR 2/12 showed cardiomeg, COPD,scarring left base, NAD. ~  CXR 5/14 showed cardiomeg, mild basilar atx/ flattened diaph/ NAD, DJD in Tspine, s/p chole... ~  9/14:  Presents w/ URI & AB exac- unresponsive to Augmentin called in, & rec Levaquin, Depo, Dosepak, Mucinex, Fluids, etc... ~  CXR 3/15 showed cardiomeg, COPD/ atelectasis in lingula & LLL, DJD in Tspine & shoulders, NAD... ~  6/15: on O2 (Helios- 2L/min continuously), NEBS w/ DuonebQid/ BudesBid, Mucinex2Bid;  stable w/o exac but at baseline has cough, thick beige sput, no hemoptysis, & chr dyspnea- ok w/ ADLs in general but SOB w/ chores/ housework/ walking in the house, etc...  ~  PFT 6/15 showed FVC=0.81 (28%), FEV1=0.43 (20%), %1sec=53%, mid-flows= 9% predicted... c/w GOLD Stage4 COPD ~  12/15: she had a recent infectious exac treated by DrMcGowen w/ Levaquin & Pred; she is improved & approaching baseline, however she has gained 17# & not on diet & not exercising; we discussed need for diet, exercise & wt reduction; she will finish the Levaquin & Pred taper and continue the O2, NEBS, Mucinex, etc...  ~  12/15> Oxygen saturation on 2L/min at rest= 95% w/ pulse=64/min;  After walking 3 laps her  O2sat=93% w/ pulse=84/min... Rec to continue same. 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     SHE HAS ESTABLISHED PRIMARY CARE AT [facility]- DrMcGowen >>   HYPERTENSION (ICD-401.9) - on ATENOLOL 50mg /d, LOSARTAN 50mg /d, LASIX 40mg /d... BP=126/80 and tol well... denies HA, visual changes, CP, palipit, dizziness, syncope, change in dyspnea, edema, etc.  CORONARY ARTERY DISEASE (ICD-414.00) - on ASA 81mg /d & Imdur30... hx of CAD w/ prev MI in 2000, and subseq PTCA's in 2000, & 2001... 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  VENOUS INSUFFICIENCY (ICD-459.81) - low sodium diet, elevation, & Lasix 40-80mg /d... states she "allergic" to TED hose! and refuses to wear them...   HYPERLIPIDEMIA (ICD-272.4) - ZOCOR 80 changed to ATORVA40 in hosp 4/16 + FISH OIL 1000mg /d ... she has been counselled on diet & exercise but unsuccessful...  DM (ICD-250.00) - HUMULIN 70/30- now taking 25uAM & 25uPM she says (off Actos due to cardiomyopathy) but control is poor w/ A1c>10; she has f/u w/ PCP soon...  OBESITY (ICD-278.00) - weight = 219# (BMI=37); we discussed diet + exercise program required to lose weight!  GERD (ICD-530.81) - on PPI therapy (changed 2/10 per request to OMEPRAZOLE20mg /d)... ~  [facility]  3/12 for COPD exac & was anemic w/ heme pos stool & GIB; EGD by DrJacobs w/ gastritis & prepyloric ulcers, +HPylori- treated & improved.  CONSTIPATION (ICD-564.00) - last colonoscopy was 11/07 by DrJacobs and negative...  DEGENERATIVE JOINT DISEASE (ICD-715.90) - on Vicodin, Trmaadol, Neurontin; prev CSpine films w/ spondy and biforaminal narrowing... LSSpine films w/ DDD and osteopenia...   OSTEOPOROSIS (ICD-733.00) - on BONIVA 150mg /mo, Calcium, Vits...   ANXIETY (ICD-300.00) - uses Alprazolam 0.5mg  as needed...  Hx ANEMIA, MILD (ICD-285.9)  Health Maintenance - she hasn't seen GYN in yrs and is asked to re-establish... prev refused Mammograms due to the discomfort & bruising- she will discuss w/ Gyn & get a follow up BMD as well...   Past Surgical History  Procedure Laterality Date  . Total abdominal hysterectomy      DUB (non-malignant reasons).  No hx of abnormal pap smears.  . Shoulder open rotator cuff repair Right   . Cataract extraction w/phaco Left 06/30/2012    Procedure: CATARACT EXTRACTION PHACO AND INTRAOCULAR LENS PLACEMENT (IOC);  Surgeon: Gemma Payor, MD;  Location: [facility];  Service: Ophthalmology;  Laterality: Left;  CDE:12.32  . Transthoracic echocardiogram  06/2013    EF 40%, + WMA  . Alveoloplasty  10/1999    [name] 07/19/2010 Surgical extraction of root segments #21, 22 and 27 with alveoloplasty.  . Cardiac catheterization  07/2012    + progressive CAD but no lesions to intervene upon, EF 65%; med mgmt continued  . Coronary angioplasty with stent placement      PTCA/BMS LAD 2000, PTCA/brachytherapy LAD 2001, LVEF 55%   . Coronary angioplasty with stent placement  2000; 2001; 2002    [name] 05/18/2010  . Tonsillectomy  1949  . Laparoscopic cholecystectomy    . Cataract extraction w/ intraocular lens implant Right 2015?  Marland Kitchen Left heart catheterization with coronary angiogram N/A 06/25/2014    Procedure: LEFT HEART CATHETERIZATION WITH CORONARY ANGIOGRAM;  Surgeon: Lyn Records, MD;  Location: [facility] CATH LAB;  Service: Cardiovascular;  Laterality: N/A;    Outpatient Encounter Prescriptions as of 08/31/2014  Medication Sig  . acetaminophen (TYLENOL) 650 MG suppository Place 650 mg rectally every 4 (four) hours as needed.  Marland Kitchen albuterol (PROVENTIL) (2.5 MG/3ML) 0.083% nebulizer solution Take 3 mLs (2.5 mg total) by nebulization 4 (four) times daily.  Marland Kitchen ALPRAZolam (XANAX) 0.5 MG tablet Take 1 tablet (0.5 mg total) by mouth 3 (three) times daily.  Marland Kitchen arformoterol (BROVANA) 15 MCG/2ML NEBU Take 2 mLs (15 mcg total) by nebulization 2 (two) times daily.  Marland Kitchen aspirin 81 MG tablet Take 81 mg by mouth daily.  Marland Kitchen atorvastatin (LIPITOR) 40 MG tablet Take 1 tablet (40 mg total) by mouth daily at 6 PM.  . bisoprolol (ZEBETA) 5 MG tablet Take 0.5 tablets (2.5 mg total) by mouth daily.  . budesonide (PULMICORT) 0.25 MG/2ML nebulizer solution Take 2 mLs (0.25 mg total) by nebulization 2 (two) times daily.  . calcium-vitamin D (OSCAL WITH D) 500-200 MG-UNIT per tablet Take 1 tablet by mouth 2 (two) times daily.    . Cholecalciferol (VITAMIN D3) 1000 UNITS CAPS Take 1 capsule by mouth daily.   Marland Kitchen dextromethorphan-guaiFENesin (MUCINEX DM) 30-600 MG per 12 hr tablet Take 2 tablets by mouth every 12 (twelve) hours.    . diphenoxylate-atropine (LOMOTIL) 2.5-0.025 MG per tablet Take 1 tablet by mouth 4 (four) times daily as needed for diarrhea or loose stools.  Marland Kitchen doxycycline (VIBRAMYCIN) 100 MG capsule Take 100 mg by mouth 2 (two) times daily.  . ferrous sulfate 325 (65 FE) MG tablet Take 325 mg by mouth daily with breakfast.   . furosemide (LASIX) 40 MG tablet Take 40 mg by mouth daily.  Marland Kitchen glucose blood (ONE TOUCH ULTRA TEST) test strip TEST BLOOD SUGAR LEVELS THREE TIMES DAILY  . ibandronate (BONIVA) 150 MG tablet TAKE 1 TABLET EVERY MONTH ON THE SAME DAY  . insulin aspart protamine - aspart (NOVOLOG MIX 70/30 FLEXPEN) (70-30) 100 UNIT/ML FlexPen Inject 0.25 mLs (25 Units total) into the skin  2 (two) times daily with a meal. E11.8  . Insulin Lispro, Human, (HUMALOG [place]) Inject into the skin.  Marland Kitchen ipratropium (ATROVENT) 0.02 % nebulizer solution Take 2.5 mLs (500 mcg total) by nebulization 4 (four) times daily.  . isosorbide mononitrate (IMDUR) 30 MG 24 hr tablet TAKE 1 TABLET DAILY  .  losartan (COZAAR) 50 MG tablet TAKE ONE TABLET BY MOUTH ONCE DAILY  . Multiple Vitamin (MULTIVITAMIN) tablet Take 1 tablet by mouth daily.  . nitroGLYCERIN (NITROSTAT) 0.4 MG SL tablet Place 1 tablet (0.4 mg total) under the tongue every 5 (five) minutes as needed. May repeat x3  . omeprazole (PRILOSEC) 20 MG capsule TAKE ONE CAPSULE BY MOUTH ONCE DAILY 30 MINUTES BEFORE A MEAL  . ONE TOUCH LANCETS MISC Check blood sugars two times daily  . potassium chloride (K-DUR) 10 MEQ tablet TAKE 1 TABLET DAILY  . Probiotic Product (PROBIOTIC DAILY PO) Take 1 tablet by mouth daily.   . sodium chloride (OCEAN) 0.65 % SOLN nasal spray Place 1 spray into both nostrils 2 (two) times daily as needed for congestion.  Lauris Poag INSULIN SYRINGE 30G X 5/16" 0.5 ML MISC USE 3 TIMES A DAY AS DIRECTED  . vitamin B-12 (CYANOCOBALAMIN) 500 MCG tablet Take 500 mcg by mouth daily.   . vitamin C (ASCORBIC ACID) 500 MG tablet Take 500 mg by mouth daily.  . [DISCONTINUED] Torsemide (DEMADEX PO) Take 40 mg by mouth 2 (two) times daily.   No facility-administered encounter medications on file as of 08/31/2014.     Allergies  Allergen Reactions  . Codeine Nausea And Vomiting    vomiting  . Terbutaline Sulfate     REACTION: INTOL to terbutaline    Current Medications, Allergies, Past Medical History, Past Surgical History, Family History, and Social History were reviewed in [employer] record.    Review of Systems         See HPI - all other systems neg except as noted...   The patient complains of dyspnea on exertion, peripheral edema, muscle weakness, and difficulty walking.  The patient denies  anorexia, fever, weight loss, weight gain, vision loss, decreased hearing, hoarseness, chest pain, syncope, prolonged cough, headaches, hemoptysis, abdominal pain, melena, hematochezia, severe indigestion/heartburn, hematuria, incontinence, suspicious skin lesions, transient blindness, depression, unusual weight change, abnormal bleeding, enlarged lymph nodes, and angioedema.    Objective:   Physical Exam    WD, Obese, 74 y/o WF in NAD... GENERAL:  Alert & oriented; pleasant & cooperative...  HEENT:  [place]/AT, EOM-wnl, PERRLA, EACs-clear, TMs-wnl, NOSE-clear, THROAT-clear & wnl. NECK:  Supple w/ fairROM; no JVD; normal carotid impulses w/o bruits; no thyromegaly or nodules palpated; no lymphadenopathy. CHEST:  Decr BS bilat, few scat rhonchi, without wheezing, rales, or rubs...upper airway psuedo-wheezing HEART:  Regular Rhythm;  gr 1/6 SEM without rubs or gallops detected... ABDOMEN:  Obese w/ panniculus, soft & nontender; normal bowel sounds; no organomegaly or masses detected. EXT: without deformities, mild arthritic changes; no varicose veins/ +venous insuffic/ tr edema. Sl tender on palpation over lumbar area & siatic notch... NEURO:  CN's intact; no focal neuro deficits... DERM:  no lesion seen...  RADIOLOGY DATA:  Reviewed in the EPIC EMR & discussed w/ the patient...  LABORATORY DATA:  Reviewed in the EPIC EMR & discussed w/ the patient...   Assessment & Plan:    COPD>  continues home Oxygen, Nebulizer, Mucinex, & Fluids;  Recent Hosp as above, now back on maint meds... OSA>  Can't find old data, sleep med notes, any download data, etc... She was not using CPAP for yrs prior to 4/16 [facility]... She will bring machine, may need to start over (addendum- she flat out refuses to use the CPAP).  HBP>  Good control on Atenolol50, Losartan50, Furosemide40; discussed diet, must get wt down, no sodium... CAD>  Followed by DrMcDowell in [place] office- recent cath reviewed, no option for surg  or intervention, continue medical management... Takotsubo Cardiomyopathy> see above (4/16 Hosp)  HYPERLIPID>  On Atorva40 now + Fish Oil; needs better diet & get wt down!  DM>  Control remains poor as she has been unable to diet effectively, lose weight, etc; now on 70/30 insulin taking 30u Bid & A1c is 7.6 therefore rec incr to 35u Bid...  OBESITY>  Weight reduction is key to improvement in all areas...  GI> GERD, Ulcers, +HPylori, Constip>  Improved after HPylori Rx & back on Omep 20mg /d...  DJD, LBP, etc>  Lumbar XRay reviewed, trial Tramadol, Robaxin, rest/ heat/ etc... Refer to Ortho for further eval & Rx...  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  Anxiety>  Alprazolam for prn use- refilled...  Anemia>  On Fe daily and Hg is improved (s/p hosp w/ GIB)...   Patient's Medications  New Prescriptions   No medications on file  Previous Medications   ACETAMINOPHEN (TYLENOL) 650 MG SUPPOSITORY    Place 650 mg rectally every 4 (four) hours as needed.   ALBUTEROL (PROVENTIL) (2.5 MG/3ML) 0.083% NEBULIZER SOLUTION    Take 3 mLs (2.5 mg total) by nebulization 4 (four) times daily.   ALPRAZOLAM (XANAX) 0.5 MG TABLET    Take 1 tablet (0.5 mg total) by mouth 3 (three) times daily.   ARFORMOTEROL (BROVANA) 15 MCG/2ML NEBU    Take 2 mLs (15 mcg total) by nebulization 2 (two) times daily.   ASPIRIN 81 MG TABLET    Take 81 mg by mouth daily.   ATORVASTATIN (LIPITOR) 40 MG TABLET    Take 1 tablet (40 mg total) by mouth daily at 6 PM.   BISOPROLOL (ZEBETA) 5 MG TABLET    Take 0.5 tablets (2.5 mg total) by mouth daily.   BUDESONIDE (PULMICORT) 0.25 MG/2ML NEBULIZER SOLUTION    Take 2 mLs (0.25 mg total) by nebulization 2 (two) times daily.   CALCIUM-VITAMIN D (OSCAL WITH D) 500-200 MG-UNIT PER TABLET    Take 1 tablet by mouth 2 (two) times daily.     CHOLECALCIFEROL (VITAMIN D3) 1000 UNITS CAPS    Take 1 capsule by mouth daily.     DEXTROMETHORPHAN-GUAIFENESIN (MUCINEX DM) 30-600 MG PER 12 HR TABLET    Take 2 tablets by mouth every 12 (twelve) hours.     DIPHENOXYLATE-ATROPINE (LOMOTIL) 2.5-0.025 MG PER TABLET    Take 1 tablet by mouth 4 (four) times daily as needed for diarrhea or loose stools.   DOXYCYCLINE (VIBRAMYCIN) 100 MG CAPSULE    Take 100 mg by mouth 2 (two) times daily.   FERROUS SULFATE 325 (65 FE) MG TABLET    Take 325 mg by mouth daily with breakfast.    FUROSEMIDE (LASIX) 40 MG TABLET    Take 40 mg by mouth daily.   GLUCOSE BLOOD (ONE TOUCH ULTRA TEST) TEST STRIP    TEST BLOOD SUGAR LEVELS THREE TIMES DAILY   IBANDRONATE (BONIVA) 150 MG TABLET    TAKE 1 TABLET EVERY MONTH ON THE SAME DAY   INSULIN ASPART PROTAMINE - ASPART (NOVOLOG MIX 70/30 FLEXPEN) (70-30) 100 UNIT/ML FLEXPEN    Inject 0.25 mLs (25 Units total) into the skin 2 (two) times daily with a meal. E11.8   INSULIN LISPRO, HUMAN, (HUMALOG [place])    Inject into the skin.   IPRATROPIUM (ATROVENT) 0.02 % NEBULIZER SOLUTION    Take 2.5 mLs (  500 mcg total) by nebulization 4 (four) times daily.   ISOSORBIDE MONONITRATE (IMDUR) 30 MG 24 HR TABLET    TAKE 1 TABLET DAILY   LOSARTAN (COZAAR) 50 MG TABLET    TAKE ONE TABLET BY MOUTH ONCE DAILY   MULTIPLE VITAMIN (MULTIVITAMIN) TABLET    Take 1 tablet by mouth daily.   NITROGLYCERIN (NITROSTAT) 0.4 MG SL TABLET    Place 1 tablet (0.4 mg total) under the tongue every 5 (five) minutes as needed. May repeat x3   OMEPRAZOLE (PRILOSEC) 20 MG CAPSULE    TAKE ONE CAPSULE BY MOUTH ONCE DAILY 30 MINUTES BEFORE A MEAL   ONE TOUCH LANCETS MISC    Check blood sugars two times daily   POTASSIUM CHLORIDE (K-DUR) 10 MEQ TABLET    TAKE 1 TABLET DAILY   PROBIOTIC PRODUCT (PROBIOTIC DAILY PO)    Take 1 tablet by mouth daily.    SODIUM CHLORIDE (OCEAN) 0.65 % SOLN NASAL SPRAY    Place 1 spray into both nostrils 2 (two) times daily as needed for congestion.   TRUEPLUS INSULIN SYRINGE 30G X 5/16" 0.5 ML MISC    USE 3 TIMES A DAY AS  DIRECTED   VITAMIN B-12 (CYANOCOBALAMIN) 500 MCG TABLET    Take 500 mcg by mouth daily.    VITAMIN C (ASCORBIC ACID) 500 MG TABLET    Take 500 mg by mouth daily.  Modified Medications   No medications on file  Discontinued Medications   TORSEMIDE (DEMADEX PO)    Take 40 mg by mouth 2 (two) times daily.

## 2014-09-03 ENCOUNTER — Ambulatory Visit: Payer: PPO | Admitting: Pulmonary Disease

## 2014-09-22 ENCOUNTER — Ambulatory Visit (INDEPENDENT_AMBULATORY_CARE_PROVIDER_SITE_OTHER): Payer: PPO | Admitting: Cardiology

## 2014-09-22 ENCOUNTER — Encounter: Payer: Self-pay | Admitting: Cardiology

## 2014-09-22 VITALS — BP 130/60 | HR 73 | Ht 64.0 in | Wt 217.4 lb

## 2014-09-22 DIAGNOSIS — J449 Chronic obstructive pulmonary disease, unspecified: Secondary | ICD-10-CM

## 2014-09-22 DIAGNOSIS — N183 Chronic kidney disease, stage 3 unspecified: Secondary | ICD-10-CM

## 2014-09-22 DIAGNOSIS — I10 Essential (primary) hypertension: Secondary | ICD-10-CM | POA: Diagnosis not present

## 2014-09-22 DIAGNOSIS — I2510 Atherosclerotic heart disease of native coronary artery without angina pectoris: Secondary | ICD-10-CM | POA: Diagnosis not present

## 2014-09-22 DIAGNOSIS — I429 Cardiomyopathy, unspecified: Secondary | ICD-10-CM

## 2014-09-22 NOTE — Progress Notes (Signed)
Cardiology Office Note  Date: 09/22/2014   ID: [name], [name] [birth year], MRN [id number]  PCP: Toma Deiters, MD  Primary Cardiologist: Nona Dell, MD   Chief Complaint  Patient presents with  . Coronary Artery Disease  . Hyperlipidemia    History of Present Illness: [name] is a 74 y.o. female last seen in March. Interval records reviewed. 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 Cardiac catheterization performed by Dr. Excell Seltzer at that time as outlined below, she was managed medically.  She is here today with her daughter for a follow-up visit. Fortunately, has been relatively stable, no recurrent hospitalizations. She uses oxygen continuously, is able to ablate her about her house for basic ADLs. She denies any angina symptoms. She has mild ankle edema and complains of neuropathy symptoms as well. Weight is down significantly compared to March, and relatively stable over the last month.  I reviewed her cardiac medications as well as her echocardiogram from April. Her most recent lab work is also outlined below.   Past Medical History  Diagnosis Date  . COPD (chronic obstructive pulmonary disease)   . Hyperlipidemia   . Essential hypertension, benign   . Anemia   . Anxiety   . Coronary atherosclerosis of native coronary artery     a. history of MI with PTCA/stent to LAD 07/1998. b. Repeat PTCA to this lesion in 02/1999. c. PTCA/brachytherapy to mLAD in 2001  . Osteoporosis   . Venous insufficiency   . Obesity   . Asthma   . Herpes zoster 06/2013  . Chronic respiratory failure   . GERD (gastroesophageal reflux disease)   . On home oxygen therapy   . Myocardial infarction 2000  . OSA (obstructive sleep apnea)   . Type 2 diabetes mellitus   . History of bleeding ulcers   . DDD (degenerative disc disease), lumbar     Past Surgical  History  Procedure Laterality Date  . Total abdominal hysterectomy      DUB (non-malignant reasons).  No hx of abnormal pap smears.  . Shoulder open rotator cuff repair Right   . Cataract extraction w/phaco Left 06/30/2012    Procedure: CATARACT EXTRACTION PHACO AND INTRAOCULAR LENS PLACEMENT (IOC);  Surgeon: Gemma Payor, MD;  Location: [facility];  Service: Ophthalmology;  Laterality: Left;  CDE:12.32  . Transthoracic echocardiogram  06/2013    EF 40%, + WMA  . Alveoloplasty  10/1999    [name] 07/19/2010 Surgical extraction of root segments #21, 22 and 27 with alveoloplasty.  . Cardiac catheterization  07/2012    + progressive CAD but no lesions to intervene upon, EF 65%; med mgmt continued  . Coronary angioplasty with stent placement      PTCA/BMS LAD 2000, PTCA/brachytherapy LAD 2001, LVEF 55%   . Coronary angioplasty with stent placement  2000; 2001; 2002    [name] 05/18/2010  . Tonsillectomy  1949  . Laparoscopic cholecystectomy    . Cataract extraction w/ intraocular lens implant Right 2015?  Marland Kitchen Left heart catheterization with coronary angiogram N/A 06/25/2014    Procedure: LEFT HEART CATHETERIZATION WITH CORONARY ANGIOGRAM;  Surgeon: Lyn Records, MD;  Location: [facility] CATH LAB;  Service: Cardiovascular;  Laterality: N/A;    Current Outpatient Prescriptions  Medication Sig Dispense Refill  . acetaminophen (TYLENOL) 650 MG suppository Place 650 mg rectally every 4 (four) hours as needed.    Marland Kitchen  albuterol (PROVENTIL) (2.5 MG/3ML) 0.083% nebulizer solution Take 3 mLs (2.5 mg total) by nebulization 4 (four) times daily. 75 mL 11  . ALPRAZolam (XANAX) 0.5 MG tablet Take 1 tablet (0.5 mg total) by mouth 3 (three) times daily. 90 tablet 0  . arformoterol (BROVANA) 15 MCG/2ML NEBU Take 2 mLs (15 mcg total) by nebulization 2 (two) times daily. 120 mL 5  . aspirin 81 MG tablet Take 81 mg by mouth daily.    Marland Kitchen atorvastatin (LIPITOR) 40 MG tablet Take 1 tablet (40 mg total) by mouth daily at 6 PM. 30 tablet 0   . bisoprolol (ZEBETA) 5 MG tablet Take 0.5 tablets (2.5 mg total) by mouth daily. 45 tablet 0  . budesonide (PULMICORT) 0.25 MG/2ML nebulizer solution Take 2 mLs (0.25 mg total) by nebulization 2 (two) times daily. 60 mL 11  . calcium-vitamin D (OSCAL WITH D) 500-200 MG-UNIT per tablet Take 1 tablet by mouth 2 (two) times daily.      . Cholecalciferol (VITAMIN D3) 1000 UNITS CAPS Take 1 capsule by mouth daily.     Marland Kitchen dextromethorphan-guaiFENesin (MUCINEX DM) 30-600 MG per 12 hr tablet Take 2 tablets by mouth every 12 (twelve) hours.      . diphenoxylate-atropine (LOMOTIL) 2.5-0.025 MG per tablet Take 1 tablet by mouth 4 (four) times daily as needed for diarrhea or loose stools. 30 tablet 0  . ferrous sulfate 325 (65 FE) MG tablet Take 325 mg by mouth daily with breakfast.     . furosemide (LASIX) 40 MG tablet Take 40 mg by mouth daily.    Marland Kitchen glucose blood (ONE TOUCH ULTRA TEST) test strip TEST BLOOD SUGAR LEVELS THREE TIMES DAILY 100 each 5  . ibandronate (BONIVA) 150 MG tablet TAKE 1 TABLET EVERY MONTH ON THE SAME DAY 1 tablet 1  . insulin aspart protamine - aspart (NOVOLOG MIX 70/30 FLEXPEN) (70-30) 100 UNIT/ML FlexPen Inject 0.25 mLs (25 Units total) into the skin 2 (two) times daily with a meal. E11.8 15 mL 11  . Insulin Lispro, Human, (HUMALOG ) Inject into the skin.    Marland Kitchen ipratropium (ATROVENT) 0.02 % nebulizer solution Take 2.5 mLs (500 mcg total) by nebulization 4 (four) times daily. 75 mL 11  . isosorbide mononitrate (IMDUR) 30 MG 24 hr tablet TAKE 1 TABLET DAILY 90 tablet 1  . losartan (COZAAR) 50 MG tablet TAKE ONE TABLET BY MOUTH ONCE DAILY 30 tablet 3  . Multiple Vitamin (MULTIVITAMIN) tablet Take 1 tablet by mouth daily.    . nitroGLYCERIN (NITROSTAT) 0.4 MG SL tablet Place 1 tablet (0.4 mg total) under the tongue every 5 (five) minutes as needed. May repeat x3 25 tablet 6  . omeprazole (PRILOSEC) 20 MG capsule TAKE ONE CAPSULE BY MOUTH ONCE DAILY 30 MINUTES BEFORE A MEAL 30 capsule 3   . ONE TOUCH LANCETS MISC Check blood sugars two times daily 100 each 6  . potassium chloride (K-DUR) 10 MEQ tablet TAKE 1 TABLET DAILY 30 tablet 0  . Probiotic Product (PROBIOTIC DAILY PO) Take 1 tablet by mouth daily.     . sodium chloride (OCEAN) 0.65 % SOLN nasal spray Place 1 spray into both nostrils 2 (two) times daily as needed for congestion.    Lauris Poag INSULIN SYRINGE 30G X 5/16" 0.5 ML MISC USE 3 TIMES A DAY AS DIRECTED 100 each 6  . vitamin B-12 (CYANOCOBALAMIN) 500 MCG tablet Take 500 mcg by mouth daily.     . vitamin C (ASCORBIC ACID) 500 MG  tablet Take 500 mg by mouth daily.     No current facility-administered medications for this visit.    Allergies:  Codeine and Terbutaline sulfate   Social History: The patient  reports that she quit smoking about 16 years ago. Her smoking use included Cigarettes. She has a 30 pack-year smoking history. She has never used smokeless tobacco. She reports that she does not drink alcohol or use illicit drugs.   ROS:  Please see the history of present illness. Otherwise, complete review of systems is positive for chronic dyspnea on exertion.  All other systems are reviewed and negative.   Physical Exam: VS:  BP 130/60 mmHg  Pulse 73  Ht  (1.626 m)  Wt 217 lb 6.4 oz (98.612 kg)  BMI 37.30 kg/m2  SpO2 96%, BMI Body mass index is 37.3 kg/(m^2).  Wt Readings from Last 3 Encounters:  09/22/14 217 lb 6.4 oz (98.612 kg)  08/31/14 220 lb 6.4 oz (99.973 kg)  08/04/14 219 lb 9.6 oz (99.61 kg)    Chronically ill-appearing woman in no distress. Wearing oxygen via nasal cannula.  HEENT: Conjunctiva and lids normal, oropharynx clear.  Neck: Supple, no elevated JVP or bruits.  Lungs: Diminished breath sounds throughout, prolonged expiratory phase, nonlabored.  Cardiac: Regular rate and rhythm, no S3 gallop or rub.  Abdomen: Nontender, bowel sounds present.  Skin: Warm and dry.  Extremities: Trace ankle edema  bilaterally. Musculoskeletal: No kyphosis. Neuropsychiatric: Alert and oriented 3, affect appropriate.   ECG: ECG is not ordered today.  Recent Labwork: 06/27/2014: B Natriuretic Peptide 189.3* 06/28/2014: Hemoglobin 14.4; Platelets 180 07/06/2014: ALT 25; AST 15; BUN 31*; Creatinine, Ser 1.31*; Magnesium 1.5; Potassium 3.8; Sodium 132*     Component Value Date/Time   CHOL 167 09/21/2013 0951   TRIG 376.0* 09/21/2013 0951   HDL 36.20* 09/21/2013 0951   CHOLHDL 5 09/21/2013 0951   VLDL 75.2* 09/21/2013 0951   LDLCALC 56 09/21/2013 0951   LDLDIRECT 73.9 07/16/2012 1054    Other Studies Reviewed Today:  1. Cardiac catheterization 06/25/2014: Procedural Findings: Hemodynamics: AO 85/52 with a mean of 63 LV 81/14  Coronary angiography: Coronary dominance: right  Left mainstem: The left main is patent with mild 20-30% distal left mainstem stenosis. The vessel divides into the LAD and left circumflex.  Left anterior descending (LAD): The LAD is patent. There is moderate proximal LAD stenosis just before the stented segment with associated calcification. I would estimate this at 60%. There is also moderate in-stent restenosis of 50-60% in the proximal portion of the stented segment in the mid LAD. The remainder of the stent has mild diffuse 30% in-stent restenosis. The mid and distal LAD have diffuse disease with at most 75% stenosis. Diagonal branches are patent.  Left circumflex (LCx): The left circumflex is patent. There is a small intermediate branch. The obtuse marginal branches are patent without significant stenoses. There is mild irregularity throughout the circumflex distribution. The left circumflex is dominant.  Right coronary artery (RCA): Small, nondominant vessel. There is an 80% stenosis in the proximal RCA which is unchanged from the previous study. This vessel only supplies the RV marginal branches.  Left ventriculography: Deferred   Estimated Blood Loss:  Minimal  Final Conclusions:  Stable 2 vessel coronary artery disease compared to previous cardiac catheterization from 2014. There is severe stenosis of a small nondominant right coronary artery which should be managed medically, and moderate stenosis of the LAD with no progression of disease compared to the previous study  Recommendations: Will discontinue IV heparin and transition her to DVT prophylaxis dose heparin. Otherwise recommend medical management of her coronary artery disease. 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  2. Echocardiogram 06/25/2014: Study Conclusions  - Left ventricle: The cavity size was normal. Wall thickness was increased in a pattern of mild LVH. Systolic function was moderately to severely reduced. The estimated ejection fraction was in the range of 30% to 35%. There is severe hypokinesis of the mid-apicalinferior and apical myocardium. Due to tachycardia, there was fusion of early and atrial contributions to ventricular filling. - Pericardium, extracardiac: A trivial pericardial effusion was identified. Features were not consistent with tamponade physiology.   ASSESSMENT AND PLAN:  1. 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 She has been clinically stable on medical therapy since then, weight also stable. Plan to follow-up echocardiogram to reassess LVEF.  2. Symptomatically stable CAD, cardiac catheterization from April of this year noted above. Plan to continue medical therapy.  3. CKD stage 2-3. Creatinine 1.3.  4. Severe, oxygen dependent COPD.  5. Essential hypertension, blood pressure is reasonably well controlled today.  Current medicines were reviewed at length with the patient today.   Orders Placed This Encounter  Procedures  . Echocardiogram    Disposition: FU with me in 3 months.   Signed, Jonelle SidleSamuel G.  Josephine Rudnick, MD, [facility]FACC 09/22/2014 9:41 AM    [facility] [address], [place], [place] [address] Phone: [phone]; Fax: [phone]

## 2014-09-22 NOTE — Patient Instructions (Signed)
Your physician recommends that you schedule a follow-up appointment in: 3 MONTHS [facility]  Your physician has requested that you have an echocardiogram. Echocardiography is a painless test that uses sound waves to create images of your heart. It provides your doctor with information about the size and shape of your heart and how well your heart's chambers and valves are working. This procedure takes approximately one hour. There are no restrictions for this procedure.  Your physician recommends that you continue on your current medications as directed. Please refer to the Current Medication list given to you today.   Thanks for choosing [facility]!!!

## 2014-09-24 ENCOUNTER — Other Ambulatory Visit: Payer: Self-pay | Admitting: Family Medicine

## 2014-09-24 NOTE — Telephone Encounter (Signed)
RF request for potassium.  LOV: 05/21/14 acute visit Next ov: None Last written: 08/24/14 #30 w/ 0RF Please advise. Thanks.

## 2014-09-27 ENCOUNTER — Ambulatory Visit (HOSPITAL_COMMUNITY): Payer: PPO

## 2014-09-28 ENCOUNTER — Other Ambulatory Visit: Payer: Self-pay | Admitting: Family Medicine

## 2014-09-28 NOTE — Telephone Encounter (Signed)
RF request for potassium.  LOV: 09/22/14 Next ov: None Last written: 08/24/14 #30 w/ 1OX

## 2014-10-06 ENCOUNTER — Ambulatory Visit (HOSPITAL_COMMUNITY): Payer: PPO

## 2014-10-18 ENCOUNTER — Other Ambulatory Visit: Payer: Self-pay | Admitting: Family Medicine

## 2014-10-26 ENCOUNTER — Encounter: Payer: Self-pay | Admitting: Pulmonary Disease

## 2014-10-26 ENCOUNTER — Other Ambulatory Visit (INDEPENDENT_AMBULATORY_CARE_PROVIDER_SITE_OTHER): Payer: PPO

## 2014-10-26 ENCOUNTER — Ambulatory Visit (INDEPENDENT_AMBULATORY_CARE_PROVIDER_SITE_OTHER): Payer: PPO | Admitting: Pulmonary Disease

## 2014-10-26 VITALS — BP 120/72 | HR 74 | Temp 97.6°F | Wt 215.8 lb

## 2014-10-26 DIAGNOSIS — I10 Essential (primary) hypertension: Secondary | ICD-10-CM | POA: Diagnosis not present

## 2014-10-26 DIAGNOSIS — G4733 Obstructive sleep apnea (adult) (pediatric): Secondary | ICD-10-CM | POA: Diagnosis not present

## 2014-10-26 DIAGNOSIS — I214 Non-ST elevation (NSTEMI) myocardial infarction: Secondary | ICD-10-CM

## 2014-10-26 DIAGNOSIS — I5181 Takotsubo syndrome: Secondary | ICD-10-CM

## 2014-10-26 DIAGNOSIS — E669 Obesity, unspecified: Secondary | ICD-10-CM

## 2014-10-26 DIAGNOSIS — J441 Chronic obstructive pulmonary disease with (acute) exacerbation: Secondary | ICD-10-CM

## 2014-10-26 DIAGNOSIS — Z9861 Coronary angioplasty status: Secondary | ICD-10-CM

## 2014-10-26 DIAGNOSIS — J9621 Acute and chronic respiratory failure with hypoxia: Secondary | ICD-10-CM | POA: Diagnosis not present

## 2014-10-26 DIAGNOSIS — I2510 Atherosclerotic heart disease of native coronary artery without angina pectoris: Secondary | ICD-10-CM

## 2014-10-26 DIAGNOSIS — E118 Type 2 diabetes mellitus with unspecified complications: Secondary | ICD-10-CM

## 2014-10-26 LAB — BASIC METABOLIC PANEL
BUN: 23 mg/dL (ref 6–23)
CALCIUM: 9.9 mg/dL (ref 8.4–10.5)
CO2: 33 mEq/L — ABNORMAL HIGH (ref 19–32)
Chloride: 102 mEq/L (ref 96–112)
Creatinine, Ser: 1.28 mg/dL — ABNORMAL HIGH (ref 0.40–1.20)
GFR: 43.3 mL/min — ABNORMAL LOW (ref >60.00)
Glucose, Bld: 147 mg/dL — ABNORMAL HIGH (ref 70–99)
POTASSIUM: 3.8 meq/L (ref 3.5–5.1)
Sodium: 142 mEq/L (ref 135–145)

## 2014-10-26 NOTE — Progress Notes (Deleted)
Subjective:     Patient ID: [name], female   DOB: [birth year], 74 y.o.   MRN: [id number]  HPI   Review of Systems     Objective:   Physical Exam     Assessment:     ***    Plan:     ***

## 2014-10-26 NOTE — Progress Notes (Signed)
Subjective:    Patient ID: [name], female    DOB: Jul 24, 1940, 74 y.o.   MRN: [id number]  HPI 74 y/o WF here for a follow up visit... [name] has 13 grandchildren & 18 great-grands!  She also has mult med problems including severe COPD; HBP; CAD w/prev MI & PTCA followed by DrMcDowell; VI; Hyperlipidemia; DM on insulin; Obesity; GERD/ constipation; DJD/ osteoporosis; Anxiety...   ~  SEE PREV EPIC NOTES FOR OLDER DATA >>    LABS 5/14:  FLP- not at goals w/ TG=227, HDL=33;  Chems- ok x BS=160, A1c=7.3;  CBC- wnl;  TSH=2.09...  LABS 12/14:  chems- ok x BS=203, A1c=7.6 on her Metform500Bid & 70/30 insulin 30uBid=> Rec to incr to 35uBid + diet & exercise...  CXR 3/15 showed cardiomeg, COPD/ atelectasis in lingula & LLL, DJD in Tspine & shoulders, NAD...  PFT 6/15 showed FVC=0.81 (28%), FEV1=0.43 (20%), %1sec=53%, mid-flows= 9% predicted... c/w GOLD Stage4 COPD.  ~  February 02, 2014:  662mo ROV & [name] has seen DrMcGowen several times- most recently 01/27/14 for a sick visit & felt to have a COPD exac- treaqted w/ Depo80, Pred taper, & Levaquin;  She states improved , breathing is better w/ min cough, whitish sput, & no f/c/s; she has baseline DOE- no better due to 17# wt gain & she admits to not dieting & not exercising! We reviewed diet, exercise & wt reduction strategies;  We walked her in the office on her O2 at 2L/min (see below);  We reviewed the following medical problems during today's office visit >>     HxSinusitis> on MMW, Flonase, OTC antihist prn; c/o chr congestion, & recent infection treated by DrMcGowen w/ Levaquin...    COPD (GOLD Stage4)> on O2 (Helios system- 2L/min continuously), NEBS w/ DuonebQid/ BudesBid, Mucinex2Bid; +recent exac as noted & improved on Levaquin & Pred taper; chr dyspnea- ok w/ ADLs in general but SOB w/ chores/ housework/ walking in the house, etc; she has gained wt, not dieting & not exercising- we read her the riot act!    BP is controlled on Aten50, Losar50,  Lasix40 & measures 138/82 today... She denies CP, palpit, ch in SOB, etc on her ASA81 & Imdur30...     Chol is regulated w/ Simva80 Qhs- last FLP 7/15 showed elev TG & she is reminded to follow a strict low fat diet & work on weight reduction...    DM control is fair on her Actos15 and 70/30 insulin taking 30u Bid per Primary Care; Labs 10/15 showed BS=156, A1c= 7.9; she will f/u w/ Primary care soon    She remains on Prilosec20, Boniva150, Neurontin, Lodine/Tramadol, Vicodin, Alprazolam as well...  We reviewed prob list, meds, xrays and labs> see below for updates >> she continues to REFUSE Flu shots and Pneumonia vaccine...  Oxygen saturation on 2L/min at rest= 95% w/ pulse=64/min;  After walking 3 laps her O2sat=93% w/ pulse=84/min... Rec to continue same.    ~  August 04, 2014:  662mo ROV & [name] was [facility] 4/20 - 06/29/14 by [facility] "I just got sick- a little bit of everything"> DCSummary says COPD exac w/ acute on chr resp failure, OSA on CPAP, NSTEMI, cardiomyopathy, DM, HL, RI, etc... 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  We reviewed the following medical problems during today's office visit >>     OSA> on CPAP but she wasn't using this prior to adm, she will use it regularly & bring machine in for a download at next visit, I cannot find initial sleep study or Sleep Med notes in epic...    HxSinusitis> on MMW, Flonase, OTC antihist prn; c/o chr congestion, & prev infection treated by DrMcGowen w/ Levaquin...    COPD (GOLD Stage4)> on O2 (Helios system- 2L/min continuously), NEBS w/ DuonebQid/ BudesBid, Mucinex2Bid; +recent Hosp w/ acute on chr  resp failure as noted & improved on Levaquin & Pred taper (off now); chr dyspnea- ok w/ ADLs in general but SOB w/ chores/ housework/ walking in the house, etc; pCO2 in the 50 range but up to 80 prior to intubation 4/16; she has gained wt, not dieting & not exercising- we read her the riot act!    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    BP is controlled on above adjusted meds & measures 104/58 today... She denies CP, palpit, ch in SOB, etc on her ASA81 & Imdur30...     Chol is regulated w/ Atorva40 now- last FLP 7/15 showed elev TG & she is reminded to follow a strict low fat diet & work on weight reduction, & f/u w/ PCP & Cards...    DM control is poor on her 70/30 insulin taking 25u Bid per Primary Care; Labs 4/16 showed BS=72-338, A1c= 10.2; she will f/u w/ her PCP soon...    Obesity> she diureses to 98 Kg (216#) during her Hosp; now weighs 219.6 (BMI=37) and she desperately needs to lose the weight...    She remains on Prilosec20, [id number], Alprazolam; but off prev Neurontin, Lodine/Tramadol, Vicodin... We reviewed prob list, meds, xrays and labs> [facility] reviewed...  Uses [facility] for neb meds and supplies, ?CPAP as well?  CXR 5/16 showed cardiomeg, bibasilar atx vs infiltrate-- treated w/ Levaquin PLAN>>  Continue current regimen, bring all med bottles to every visit, needs to use CPAP nightly & bring machine for check & download if poss...   ~  August 31, 2014:  4wk ROV & [name] did not bring med list or bottles to today's visit; furthermore she tells me that she refused the CPAP- doesn't like the mask/ machine/ etc and refuses to work w/ Sleep Med to help  tolerabliity/ interface problems, etc... She doesn't know her meds and is again asked to keep accurate med list handy w/ any changes from her diff providers...    OSA> she refuses CPAP or Sleep Med consult to help w/ tolerability...    HxSinusitis> on MMW, Flonase, OTC antihist prn; c/o chr congestion, & prev infection treated by DrMcGowen w/ Levaquin...    COPD (GOLD Stage4)> on O2 (Helios system- 2L/min continuously), NEBS w/ DuonebQid/ BudesBid, Mucinex2Bid; +recent Hosp w/ acute on chr resp failure as noted & improved on Levaquin & Pred taper (off now); chr dyspnea- ok w/ ADLs in general but SOB w/ chores/ housework/ walking in the house, etc; pCO2 in the  50 range but up to 80 prior to intubation 4/16; she has gained wt up to 220#, not dieting & not exercising- we begged her to get on diet, increase exercise, and get wt down...    She is overdue for CARDS f/u & she will call DrMcDowell's office to set this up; her PCP changed Demadex to Furosemide 40mg /d... We reviewed prob list, meds, xrays and labs>  PLAN>>  Continue current regimen & call for questions; reminded to bring all med bottles to each & every doctor visit; needs to incr her exercise program & get wt down! she will call for CARDS f/u appt & see me back in 6-8wks...  ~  October 26, 2014:  427mo ROV & [name] indicates "I'm-a-doin" but notes that she was [facility] 7/31 - 10/06/14 with a "mini-stroke" she says; they had stopped her ASA prior to the event & it was restarted after the episode;  She notes that her breathing is OK & she says they told her thast her CXR was fine- on NEBS w/ Brovana & Pulmicort BID, plus Albuterol&Atrovent nebs & Mucinex prn... She remains on Demadex20Bid & Diamox250Bid (see BMet below)... She is followed by DrMcDowell in the [place] [facility] office- his note from 09/22/14 is reviewed...  EXAM shows Afeb, VSS, O2sat=95% on 2-3L by [place];  HEENT- neg, mallampati4;  Chest- decr BS at bases, few scat rhonchi, w/o  w/r/consolidation;  Abd- obese, soft, neg;  Ext- VI, tr edema, no c/c;  Neuro- no focal neuro deficits...  LABS 10/2014:  Chems-  HCO3=33, K=3.8,  BUN=23, Cr=1.28 -- stable, continue same meds...  IMP/PLAN>>  She is stable from the pulmonary standpoint, continue same meds, encouraged incr activity/ exercise; ROV in  27mo sooner if needed...         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 Rx'd w/ MUCINEX, Nasal Saline, FLONASE...   ~  2/11:  c/o sinusitis & AUGMENTIN perscribed. ~  9/13:  ER visit w/ jaw pain- had wisdom tooth extracted 6/13 by DrLewis in [place]; CT showed socket left by right mandib molar extraction w/ inflamm; given PenVK, Vicodin, told f/u w/ dentist...  COPD (ICD-496) - she has COPD and is an ex-smoker... controlled on NEBULIZER w/ Duoneb Qid + Budesonide Bid, MUCINEX, etc... she has end stage disease w/ severe dyspnea w/ ADLs and no change over the last yr... ~  baseline CXR = COPD, incr heart size, NAD. ~ CT Chest 2/02 showed COPD, Atx, sl scarring, NAD. ~  bronchoscopy 3/02 w/ mucous plugging, no endobr lesions. ~  PFT's in 12/07 showed FVC=0.81 (27%), FEV1=0.54 (22%), %1sec=67, mid-flows=13%pred. ~  CXR 1/10 showed chr changes, cardiomeg, lingular atx. ~  CXR 2/11 showed cardiomeg, bibasilar scarring, NAD. ~  CXR 2/12 showed cardiomeg, COPD,scarring left base, NAD. ~  CXR 5/14 showed cardiomeg, mild basilar atx/ flattened diaph/ NAD, DJD in Tspine, s/p chole... ~  9/14:  Presents w/ URI & AB exac- unresponsive to Augmentin called in, & rec Levaquin, Depo, Dosepak, Mucinex, Fluids, etc... ~  CXR 3/15 showed cardiomeg, COPD/ atelectasis in lingula & LLL, DJD in Tspine & shoulders, NAD... ~  6/15: on O2 (Helios- 2L/min continuously), NEBS w/ DuonebQid/ BudesBid, Mucinex2Bid; stable w/o exac but at baseline has cough, thick beige sput, no hemoptysis, & chr dyspnea- ok w/ ADLs in general but SOB w/  chores/ housework/ walking in the house, etc...  ~  PFT 6/15 showed FVC=0.81 (28%),  FEV1=0.43 (20%), %1sec=53%, mid-flows= 9% predicted... c/w GOLD Stage4 COPD ~  12/15: she had a recent infectious exac treated by DrMcGowen w/ Levaquin & Pred; she is improved & approaching baseline, however she has gained 17# & not on diet & not exercising; we discussed need for diet, exercise & wt reduction; she will finish the Levaquin & Pred taper and continue the O2, NEBS, Mucinex, etc...  ~  12/15> Oxygen saturation on 2L/min at rest= 95% w/ pulse=64/min;  After walking 3 laps her O2sat=93% w/ pulse=84/min... Rec to continue same. 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     SHE HAS ESTABLISHED PRIMARY CARE AT [facility]- DrMcGowen >>   HYPERTENSION (ICD-401.9) - on ATENOLOL 50mg /d, LOSARTAN 50mg /d, LASIX 40mg /d... BP=126/80 and tol well... denies HA, visual changes, CP, palipit, dizziness, syncope, change in dyspnea, edema, etc.  CORONARY ARTERY DISEASE (ICD-414.00) - on ASA 81mg /d & Imdur30... hx of CAD w/ prev MI in 2000, and subseq PTCA's in 2000, & 2001... 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  VENOUS INSUFFICIENCY (ICD-459.81) - low sodium diet, elevation, & Lasix 40-80mg /d... states she  "allergic" to TED hose! and refuses to wear them...   HYPERLIPIDEMIA (ICD-272.4) - ZOCOR 80 changed to ATORVA40 in hosp 4/16 + FISH OIL 1000mg /d ... she has been counselled on diet & exercise but unsuccessful...  DM (ICD-250.00) - HUMULIN 70/30- now taking 25uAM & 25uPM she says (off Actos due to cardiomyopathy) but control is poor w/ A1c>10; she has f/u w/ PCP soon...  OBESITY (ICD-278.00) - weight = 219# (BMI=37); we discussed diet + exercise program required to lose weight!  GERD (ICD-530.81) - on PPI therapy (changed 2/10 per request to OMEPRAZOLE20mg /d)... ~  [facility] 3/12 for COPD exac & was anemic w/ heme pos stool & GIB; EGD by DrJacobs w/ gastritis & prepyloric ulcers, +HPylori- treated & improved.  CONSTIPATION (ICD-564.00) - last colonoscopy was 11/07 by DrJacobs and negative...  DEGENERATIVE JOINT DISEASE (ICD-715.90) - on Vicodin, Trmaadol, Neurontin; prev CSpine films w/ spondy and biforaminal narrowing... LSSpine films w/ DDD and osteopenia...   OSTEOPOROSIS (ICD-733.00) - on BONIVA 150mg /mo, Calcium, Vits...   ANXIETY (ICD-300.00) - uses Alprazolam 0.5mg  as needed...  Hx ANEMIA, MILD (ICD-285.9)  Health Maintenance - she hasn't seen GYN in yrs and is asked to re-establish... prev refused Mammograms due to the discomfort & bruising- she will discuss w/ Gyn & get a follow up BMD as well...   Past Surgical History  Procedure Laterality Date  . Total abdominal hysterectomy      DUB (non-malignant reasons).  No hx of abnormal pap smears.  . Shoulder open rotator cuff repair Right   . Cataract extraction w/phaco Left 06/30/2012    Procedure: CATARACT EXTRACTION PHACO AND INTRAOCULAR LENS PLACEMENT (IOC);  Surgeon: Gemma Payor, MD;  Location: [facility];  Service: Ophthalmology;  Laterality: Left;  CDE:12.32  . Transthoracic echocardiogram  06/2013    EF 40%, + WMA  . Alveoloplasty  10/1999    [name] 07/19/2010 Surgical extraction of root segments #21, 22 and 27 with alveoloplasty.  .  Cardiac catheterization  07/2012    + progressive CAD but no lesions to intervene upon, EF 65%; med mgmt continued  . Coronary angioplasty with stent placement      PTCA/BMS LAD 2000, PTCA/brachytherapy LAD 2001, LVEF 55%   . Coronary angioplasty with stent placement  2000; 2001; 2002    [name] 05/18/2010  . Tonsillectomy  1949  . Laparoscopic cholecystectomy    . Cataract extraction w/ intraocular lens implant Right 2015?  Marland Kitchen Left heart catheterization with coronary angiogram N/A 06/25/2014    Procedure: LEFT HEART CATHETERIZATION WITH CORONARY ANGIOGRAM;  Surgeon: Lyn Records, MD;  Location: [facility] CATH LAB;  Service: Cardiovascular;  Laterality: N/A;    Outpatient Encounter Prescriptions as of 10/26/2014  Medication Sig  . acetaZOLAMIDE (DIAMOX) 250 MG tablet Take 250 mg by mouth 2 (two) times daily.  Marland Kitchen albuterol (PROVENTIL) (2.5 MG/3ML) 0.083% nebulizer solution Take 3 mLs (2.5 mg total) by nebulization 4 (four) times daily.  Marland Kitchen ALPRAZolam (XANAX) 0.5 MG tablet Take 1 tablet (0.5 mg total) by mouth 3 (three) times daily.  Marland Kitchen arformoterol (BROVANA) 15 MCG/2ML NEBU Take 2 mLs (15 mcg total) by nebulization 2 (two) times daily.  Marland Kitchen aspirin 81 MG tablet Take 81 mg by mouth daily.  . bisoprolol (ZEBETA) 5 MG tablet Take 0.5 tablets (2.5 mg total) by mouth daily.  . budesonide (PULMICORT) 0.25 MG/2ML nebulizer solution Take 2 mLs (0.25 mg total) by nebulization 2 (two) times daily.  . calcium-vitamin D (OSCAL WITH D) 500-200 MG-UNIT per tablet Take 1 tablet by mouth 2 (two) times daily.    . Cholecalciferol (VITAMIN D3) 1000 UNITS CAPS Take 1 capsule by mouth daily.   Marland Kitchen dextromethorphan-guaiFENesin (MUCINEX DM) 30-600 MG per 12 hr tablet Take 2 tablets by mouth every 12 (twelve) hours.    . ferrous sulfate 325 (65 FE) MG tablet Take 325 mg by mouth daily with breakfast.   . glucose blood (ONE TOUCH ULTRA TEST) test strip TEST BLOOD SUGAR LEVELS THREE TIMES DAILY  . HYDROcodone-acetaminophen  (NORCO/VICODIN) 5-325 MG per tablet Take 1 tablet by mouth 2 (two) times daily as needed for moderate pain.  Marland Kitchen ibandronate (BONIVA) 150 MG tablet TAKE 1 TABLET EVERY MONTH ON THE SAME DAY  . insulin aspart protamine - aspart (NOVOLOG MIX 70/30 FLEXPEN) (70-30) 100 UNIT/ML FlexPen Inject 0.25 mLs (25 Units total) into the skin 2 (two) times daily with a meal. E11.8  . Insulin Lispro, Human, (HUMALOG [place]) Inject into the skin.  Marland Kitchen ipratropium (ATROVENT) 0.02 % nebulizer solution Take 2.5 mLs (500 mcg total) by nebulization 4 (four) times daily.  . isosorbide mononitrate (IMDUR) 30 MG 24 hr tablet TAKE 1 TABLET DAILY  . losartan (COZAAR) 50 MG tablet TAKE ONE TABLET BY MOUTH ONCE DAILY  . Multiple Vitamin (MULTIVITAMIN) tablet Take 1 tablet by mouth daily.  . nitroGLYCERIN (NITROSTAT) 0.4 MG SL tablet Place 1 tablet (0.4 mg total) under the tongue every 5 (five) minutes as needed. May repeat x3  . omeprazole (PRILOSEC) 20 MG capsule TAKE ONE CAPSULE BY MOUTH ONCE DAILY 30 MINUTES BEFORE A MEAL  . ONE TOUCH LANCETS MISC Check blood sugars two times daily  . potassium chloride (K-DUR,KLOR-CON) 10 MEQ tablet  TAKE 1 TABLET DAILY  . simvastatin (ZOCOR) 80 MG tablet Take 80 mg by mouth daily.  . sodium chloride (OCEAN) 0.65 % SOLN nasal spray Place 1 spray into both nostrils 2 (two) times daily as needed for congestion.  . torsemide (DEMADEX) 20 MG tablet Take 20 mg by mouth daily.  Lauris Poag INSULIN SYRINGE 30G X 5/16" 0.5 ML MISC USE 3 TIMES A DAY AS DIRECTED  . vitamin B-12 (CYANOCOBALAMIN) 500 MCG tablet Take 500 mcg by mouth daily.   . vitamin C (ASCORBIC ACID) 500 MG tablet Take 500 mg by mouth daily.  Marland Kitchen acetaminophen (TYLENOL) 650 MG suppository Place 650 mg rectally every 4 (four) hours as needed.  Marland Kitchen atorvastatin (LIPITOR) 40 MG tablet Take 1 tablet (40 mg total) by mouth daily at 6 PM. (Patient not taking: Reported on 10/26/2014)  . diphenoxylate-atropine (LOMOTIL) 2.5-0.025 MG per tablet Take 1  tablet by mouth 4 (four) times daily as needed for diarrhea or loose stools. (Patient not taking: Reported on 10/26/2014)  . furosemide (LASIX) 40 MG tablet Take 40 mg by mouth daily.  . Probiotic Product (PROBIOTIC DAILY PO) Take 1 tablet by mouth daily.    No facility-administered encounter medications on file as of 10/26/2014.     Allergies  Allergen Reactions  . Codeine Nausea And Vomiting    vomiting  . Terbutaline Sulfate     REACTION: INTOL to terbutaline    Current Medications, Allergies, Past Medical History, Past Surgical History, Family History, and Social History were reviewed in [employer] record.    Review of Systems         See HPI - all other systems neg except as noted...   The patient complains of dyspnea on exertion, peripheral edema, muscle weakness, and difficulty walking.  The patient denies anorexia, fever, weight loss, weight gain, vision loss, decreased hearing, hoarseness, chest pain, syncope, prolonged cough, headaches, hemoptysis, abdominal pain, melena, hematochezia, severe indigestion/heartburn, hematuria, incontinence, suspicious skin lesions, transient blindness, depression, unusual weight change, abnormal bleeding, enlarged lymph nodes, and angioedema.    Objective:   Physical Exam    WD, Obese, 74 y/o WF in NAD... GENERAL:  Alert & oriented; pleasant & cooperative...  HEENT:  [place]/AT, EOM-wnl, PERRLA, EACs-clear, TMs-wnl, NOSE-clear, THROAT-clear & wnl. NECK:  Supple w/ fairROM; no JVD; normal carotid impulses w/o bruits; no thyromegaly or nodules palpated; no lymphadenopathy. CHEST:  Decr BS bilat, few scat rhonchi, without wheezing, rales, or rubs...upper airway psuedo-wheezing HEART:  Regular Rhythm;  gr 1/6 SEM without rubs or gallops detected... ABDOMEN:  Obese w/ panniculus, soft & nontender; normal bowel sounds; no organomegaly or masses detected. EXT: without deformities, mild arthritic changes; no varicose veins/ +venous  insuffic/ tr edema. Sl tender on palpation over lumbar area & siatic notch... NEURO:  CN's intact; no focal neuro deficits... DERM:  no lesion seen...  RADIOLOGY DATA:  Reviewed in the EPIC EMR & discussed w/ the patient...  LABORATORY DATA:  Reviewed in the EPIC EMR & discussed w/ the patient...   Assessment & Plan:    COPD>  continues home Oxygen, Nebulizer, Mucinex, & Fluids;  Recent Hosp as above, now back on maint meds... OSA>  Can't find old data, sleep med notes, any download data, etc... She was not using CPAP for yrs prior to 4/16 [facility]... She will bring machine, may need to start over (addendum- she flat out refuses to use the CPAP).   HBP>  Good control on Atenolol50, Losartan50, Furosemide40; discussed  diet, must get wt down, no sodium... CAD>  Followed by DrMcDowell in [place] office- recent cath reviewed, no option for surg or intervention, continue medical management... Takotsubo Cardiomyopathy> see above (4/16 Hosp)   HYPERLIPID>  On Atorva40 now + Fish Oil; needs better diet & get wt down!  DM>  Control remains poor as she has been unable to diet effectively, lose weight, etc; now on 70/30 insulin taking 30u Bid & A1c is 7.6 therefore rec incr to 35u Bid...  OBESITY>  Weight reduction is key to improvement in all areas...  GI> GERD, Ulcers, +HPylori, Constip>  Improved after HPylori Rx & back on Omep 20mg /d...  DJD, LBP, etc>  Lumbar XRay reviewed, trial Tramadol, Robaxin, rest/ heat/ etc... Refer to Ortho for further eval & Rx...  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  Anxiety>  Alprazolam for prn use- refilled...  Anemia>  On Fe daily and Hg is improved (s/p hosp w/ GIB)...   Patient's Medications  New Prescriptions   No medications on file  Previous Medications   ACETAMINOPHEN (TYLENOL) 650 MG SUPPOSITORY    Place 650 mg rectally every 4 (four) hours as needed.   ACETAZOLAMIDE (DIAMOX) 250 MG TABLET    Take  250 mg by mouth 2 (two) times daily.   ALBUTEROL (PROVENTIL) (2.5 MG/3ML) 0.083% NEBULIZER SOLUTION    Take 3 mLs (2.5 mg total) by nebulization 4 (four) times daily.   ALPRAZOLAM (XANAX) 0.5 MG TABLET    Take 1 tablet (0.5 mg total) by mouth 3 (three) times daily.   ARFORMOTEROL (BROVANA) 15 MCG/2ML NEBU    Take 2 mLs (15 mcg total) by nebulization 2 (two) times daily.   ASPIRIN 81 MG TABLET    Take 81 mg by mouth daily.   ATORVASTATIN (LIPITOR) 40 MG TABLET    Take 1 tablet (40 mg total) by mouth daily at 6 PM.   BISOPROLOL (ZEBETA) 5 MG TABLET    Take 0.5 tablets (2.5 mg total) by mouth daily.   BUDESONIDE (PULMICORT) 0.25 MG/2ML NEBULIZER SOLUTION    Take 2 mLs (0.25 mg total) by nebulization 2 (two) times daily.   CALCIUM-VITAMIN D (OSCAL WITH D) 500-200 MG-UNIT PER TABLET    Take 1 tablet by mouth 2 (two) times daily.     CHOLECALCIFEROL (VITAMIN D3) 1000 UNITS CAPS    Take 1 capsule by mouth daily.    DEXTROMETHORPHAN-GUAIFENESIN (MUCINEX DM) 30-600 MG PER 12 HR TABLET    Take 2 tablets by mouth every 12 (twelve) hours.     DIPHENOXYLATE-ATROPINE (LOMOTIL) 2.5-0.025 MG PER TABLET    Take 1 tablet by mouth 4 (four) times daily as needed for diarrhea or loose stools.   FERROUS SULFATE 325 (65 FE) MG TABLET    Take 325 mg by mouth daily with breakfast.    FUROSEMIDE (LASIX) 40 MG TABLET    Take 40 mg by mouth daily.   GLUCOSE BLOOD (ONE TOUCH ULTRA TEST) TEST STRIP    TEST BLOOD SUGAR LEVELS THREE TIMES DAILY   HYDROCODONE-ACETAMINOPHEN (NORCO/VICODIN) 5-325 MG PER TABLET    Take 1 tablet by mouth 2 (two) times daily as needed for moderate pain.   IBANDRONATE (BONIVA) 150 MG TABLET    TAKE 1 TABLET EVERY MONTH ON THE SAME DAY   INSULIN ASPART PROTAMINE - ASPART (NOVOLOG MIX 70/30 FLEXPEN) (70-30) 100 UNIT/ML FLEXPEN    Inject 0.25 mLs (25 Units total) into the skin 2 (two)  times daily with a meal. E11.8   INSULIN LISPRO, HUMAN, (HUMALOG [place])    Inject into the skin.   IPRATROPIUM (ATROVENT)  0.02 % NEBULIZER SOLUTION    Take 2.5 mLs (500 mcg total) by nebulization 4 (four) times daily.   ISOSORBIDE MONONITRATE (IMDUR) 30 MG 24 HR TABLET    TAKE 1 TABLET DAILY   LOSARTAN (COZAAR) 50 MG TABLET    TAKE ONE TABLET BY MOUTH ONCE DAILY   MULTIPLE VITAMIN (MULTIVITAMIN) TABLET    Take 1 tablet by mouth daily.   NITROGLYCERIN (NITROSTAT) 0.4 MG SL TABLET    Place 1 tablet (0.4 mg total) under the tongue every 5 (five) minutes as needed. May repeat x3   OMEPRAZOLE (PRILOSEC) 20 MG CAPSULE    TAKE ONE CAPSULE BY MOUTH ONCE DAILY 30 MINUTES BEFORE A MEAL   ONE TOUCH LANCETS MISC    Check blood sugars two times daily   POTASSIUM CHLORIDE (K-DUR,KLOR-CON) 10 MEQ TABLET    TAKE 1 TABLET DAILY   PROBIOTIC PRODUCT (PROBIOTIC DAILY PO)    Take 1 tablet by mouth daily.    SIMVASTATIN (ZOCOR) 80 MG TABLET    Take 80 mg by mouth daily.   SODIUM CHLORIDE (OCEAN) 0.65 % SOLN NASAL SPRAY    Place 1 spray into both nostrils 2 (two) times daily as needed for congestion.   TORSEMIDE (DEMADEX) 20 MG TABLET    Take 20 mg by mouth daily.   TRUEPLUS INSULIN SYRINGE 30G X 5/16" 0.5 ML MISC    USE 3 TIMES A DAY AS DIRECTED   VITAMIN B-12 (CYANOCOBALAMIN) 500 MCG TABLET    Take 500 mcg by mouth daily.    VITAMIN C (ASCORBIC ACID) 500 MG TABLET    Take 500 mg by mouth daily.  Modified Medications   No medications on file  Discontinued Medications   No medications on file

## 2014-10-26 NOTE — Patient Instructions (Addendum)
Today we updated your med list in our EPIC system...    Continue your current medications the same...  Today we checked your metabolic panel (blood work)...    We will contact you w/ the results when available...   Call for any questions...  Let's plan a follow up visit in 64mo, sooner if needed for problems.Marland KitchenMarland Kitchen

## 2014-11-05 ENCOUNTER — Other Ambulatory Visit: Payer: Self-pay | Admitting: Family Medicine

## 2014-11-11 ENCOUNTER — Other Ambulatory Visit: Payer: Self-pay | Admitting: Family Medicine

## 2014-11-11 NOTE — Progress Notes (Signed)
Quick Note:  Called and spoke with pt's daughter. Reviewed results and recs. Pt voiced understanding and had no further questions. ______ 

## 2014-11-12 ENCOUNTER — Other Ambulatory Visit: Payer: Self-pay | Admitting: Pulmonary Disease

## 2014-11-18 ENCOUNTER — Other Ambulatory Visit: Payer: Self-pay | Admitting: Family Medicine

## 2014-12-20 ENCOUNTER — Other Ambulatory Visit: Payer: Self-pay | Admitting: Family Medicine

## 2014-12-28 ENCOUNTER — Telehealth: Payer: Self-pay

## 2014-12-28 NOTE — Telephone Encounter (Signed)
Called pt trying to get her to have her echo done before her appointment with Dr. Diona BrownerMcDowell on Thursday. She did not answer, I left a message fore her to call me back.

## 2014-12-30 ENCOUNTER — Encounter: Payer: Self-pay | Admitting: Cardiology

## 2014-12-30 ENCOUNTER — Ambulatory Visit (INDEPENDENT_AMBULATORY_CARE_PROVIDER_SITE_OTHER): Payer: PPO | Admitting: Cardiology

## 2014-12-30 ENCOUNTER — Other Ambulatory Visit: Payer: Self-pay

## 2014-12-30 VITALS — BP 126/64 | HR 75 | Ht 64.0 in | Wt 207.0 lb

## 2014-12-30 DIAGNOSIS — I2510 Atherosclerotic heart disease of native coronary artery without angina pectoris: Secondary | ICD-10-CM | POA: Diagnosis not present

## 2014-12-30 DIAGNOSIS — I5181 Takotsubo syndrome: Secondary | ICD-10-CM

## 2014-12-30 NOTE — Progress Notes (Signed)
Cardiology Office Note  Date: 12/30/2014   ID: [name], [name] [birth year], MRN [id number]  PCP: Toma Deiters, MD  Primary Cardiologist: Nona Dell, MD   Chief Complaint  Patient presents with  . Coronary Artery Disease  . Cardiomyopathy    History of Present Illness: [name] is a 74 y.o. female last seen in July. She is here today with her daughter. States that she feels "good," has had no chest pain or increasing shortness of breath recently over baseline. She continues with supplemental oxygen. No interval hospitalizations noted.  I recommended a follow-up echocardiogram at the last visit, however this has not yet been obtained. We talked about this today, and she is willing to get it arranged. My concern was to follow up on LVEF after her Takotsubo cardiomyopathy documented earlier this year.  She continues to follow with Dr. Kriste Basque and Dr. Olena Leatherwood.   Past Medical History  Diagnosis Date  . COPD (chronic obstructive pulmonary disease) (HCC)   . Hyperlipidemia   . Essential hypertension, benign   . Anemia   . Anxiety   . Coronary atherosclerosis of native coronary artery     a. history of MI with PTCA/stent to LAD 07/1998. b. Repeat PTCA to this lesion in 02/1999. c. PTCA/brachytherapy to mLAD in 2001  . Osteoporosis   . Venous insufficiency   . Obesity   . Asthma   . Herpes zoster 06/2013  . Chronic respiratory failure (HCC)   . GERD (gastroesophageal reflux disease)   . On home oxygen therapy   . Myocardial infarction (HCC) 2000  . OSA (obstructive sleep apnea)   . Type 2 diabetes mellitus (HCC)   . History of bleeding ulcers   . DDD (degenerative disc disease), lumbar   . Takotsubo cardiomyopathy     April 2016    Current Outpatient Prescriptions  Medication Sig Dispense Refill  . acetaminophen (TYLENOL) 650 MG suppository Place 650 mg rectally every 4 (four) hours as needed.    Marland Kitchen acetaZOLAMIDE (DIAMOX) 250 MG tablet Take 250 mg by mouth 2 (two)  times daily.    Marland Kitchen albuterol (PROVENTIL) (2.5 MG/3ML) 0.083% nebulizer solution Take 3 mLs (2.5 mg total) by nebulization 4 (four) times daily. 75 mL 11  . ALPRAZolam (XANAX) 0.5 MG tablet Take 1 tablet (0.5 mg total) by mouth 3 (three) times daily. 90 tablet 0  . arformoterol (BROVANA) 15 MCG/2ML NEBU Take 2 mLs (15 mcg total) by nebulization 2 (two) times daily. 120 mL 5  . aspirin 81 MG tablet Take 81 mg by mouth daily.    Marland Kitchen atorvastatin (LIPITOR) 40 MG tablet Take 1 tablet (40 mg total) by mouth daily at 6 PM. 30 tablet 0  . bisoprolol (ZEBETA) 5 MG tablet Take 0.5 tablets (2.5 mg total) by mouth daily. 45 tablet 0  . budesonide (PULMICORT) 0.25 MG/2ML nebulizer solution Take 2 mLs (0.25 mg total) by nebulization 2 (two) times daily. 60 mL 11  . calcium-vitamin D (OSCAL WITH D) 500-200 MG-UNIT per tablet Take 1 tablet by mouth 2 (two) times daily.      . Cholecalciferol (VITAMIN D3) 1000 UNITS CAPS Take 1 capsule by mouth daily.     Marland Kitchen dextromethorphan-guaiFENesin (MUCINEX DM) 30-600 MG per 12 hr tablet Take 2 tablets by mouth every 12 (twelve) hours.      . diphenoxylate-atropine (LOMOTIL) 2.5-0.025 MG per tablet Take 1 tablet by mouth 4 (four) times daily as needed for diarrhea or loose stools. 30  tablet 0  . ferrous sulfate 325 (65 FE) MG tablet Take 325 mg by mouth daily with breakfast.     . furosemide (LASIX) 40 MG tablet Take 40 mg by mouth daily.    Marland Kitchen glucose blood (ONE TOUCH ULTRA TEST) test strip TEST BLOOD SUGAR LEVELS THREE TIMES DAILY 100 each 5  . HYDROcodone-acetaminophen (NORCO/VICODIN) 5-325 MG per tablet Take 1 tablet by mouth 2 (two) times daily as needed for moderate pain.    Marland Kitchen ibandronate (BONIVA) 150 MG tablet TAKE 1 TABLET EVERY MONTH ON THE SAME DAY 1 tablet 1  . insulin aspart protamine - aspart (NOVOLOG MIX 70/30 FLEXPEN) (70-30) 100 UNIT/ML FlexPen Inject 0.25 mLs (25 Units total) into the skin 2 (two) times daily with a meal. E11.8 15 mL 11  . Insulin Lispro, Human,  (HUMALOG [place]) Inject into the skin.    Marland Kitchen ipratropium (ATROVENT) 0.02 % nebulizer solution Take 2.5 mLs (500 mcg total) by nebulization 4 (four) times daily. 75 mL 11  . isosorbide mononitrate (IMDUR) 30 MG 24 hr tablet TAKE 1 TABLET DAILY 90 tablet 1  . losartan (COZAAR) 50 MG tablet TAKE ONE TABLET BY MOUTH ONCE DAILY 30 tablet 3  . Multiple Vitamin (MULTIVITAMIN) tablet Take 1 tablet by mouth daily.    . nitroGLYCERIN (NITROSTAT) 0.4 MG SL tablet Place 1 tablet (0.4 mg total) under the tongue every 5 (five) minutes as needed. May repeat x3 25 tablet 6  . omeprazole (PRILOSEC) 20 MG capsule TAKE ONE CAPSULE BY MOUTH ONCE DAILY 30 MINUTES BEFORE A MEAL 30 capsule 3  . ONE TOUCH LANCETS MISC Check blood sugars two times daily 100 each 6  . potassium chloride (K-DUR,KLOR-CON) 10 MEQ tablet TAKE 1 TABLET DAILY 30 tablet 3  . Probiotic Product (PROBIOTIC DAILY PO) Take 1 tablet by mouth daily.     . simvastatin (ZOCOR) 80 MG tablet Take 80 mg by mouth daily.    . sodium chloride (OCEAN) 0.65 % SOLN nasal spray Place 1 spray into both nostrils 2 (two) times daily as needed for congestion.    . torsemide (DEMADEX) 20 MG tablet Take 20 mg by mouth daily.    Lauris Poag INSULIN SYRINGE 30G X 5/16" 0.5 ML MISC USE 3 TIMES A DAY AS DIRECTED 100 each 6  . vitamin B-12 (CYANOCOBALAMIN) 500 MCG tablet Take 500 mcg by mouth daily.     . vitamin C (ASCORBIC ACID) 500 MG tablet Take 500 mg by mouth daily.     No current facility-administered medications for this visit.    Allergies:  Codeine and Terbutaline sulfate   Social History: The patient  reports that she quit smoking about 16 years ago. Her smoking use included Cigarettes. She has a 30 pack-year smoking history. She has never used smokeless tobacco. She reports that she does not drink alcohol or use illicit drugs.   ROS:  Please see the history of present illness. Otherwise, complete review of systems is positive for intermittent chest congestion.  All  other systems are reviewed and negative.   Physical Exam: VS:  BP 126/64 mmHg  Pulse 75  Ht  (1.626 m)  Wt 207 lb (93.895 kg)  BMI 35.51 kg/m2  SpO2 96%, BMI Body mass index is 35.51 kg/(m^2).  Wt Readings from Last 3 Encounters:  12/30/14 207 lb (93.895 kg)  10/26/14 215 lb 12.8 oz (97.886 kg)  09/22/14 217 lb 6.4 oz (98.612 kg)     Chronically ill-appearing woman in no distress.  Wearing oxygen via nasal cannula.  HEENT: Conjunctiva and lids normal, oropharynx clear.  Neck: Supple, no elevated JVP or bruits.  Lungs: Diminished breath sounds throughout, prolonged expiratory phase, nonlabored.  Cardiac: Regular rate and rhythm, no S3 gallop or rub.  Abdomen: Nontender, bowel sounds present.  Skin: Warm and dry.  Extremities: Trace ankle edema bilaterally.  ECG: ECG is not ordered today.  Recent Labwork: 06/27/2014: B Natriuretic Peptide 189.3* 06/28/2014: Hemoglobin 14.4; Platelets 180 07/06/2014: ALT 25; AST 15; Magnesium 1.5 10/26/2014: BUN 23; Creatinine, Ser 1.28*; Potassium 3.8; Sodium 142   Other Studies Reviewed Today:  Echocardiogram 06/25/2014: Study Conclusions  - Left ventricle: The cavity size was normal. Wall thickness was increased in a pattern of mild LVH. Systolic function was moderately to severely reduced. The estimated ejection fraction was in the range of 30% to 35%. There is severe hypokinesis of the mid-apicalinferior and apical myocardium. Due to tachycardia, there was fusion of early and atrial contributions to ventricular filling. - Pericardium, extracardiac: A trivial pericardial effusion was identified. Features were not consistent with tamponade physiology.   ASSESSMENT AND PLAN:  1. Symptomatically stable CAD status post previous LAD interventions as outlined above. We'll continue on medical therapy including aspirin, bisoprolol, Lipitor, Cozaar, and Imdur.  2. History of Takotsubo cardiomyopathy documented in  April of this year with LVEF 30-35% at that time. She has been clinically stable, follow-up echocardiogram will be obtained to reassess LVEF which has hopefully normalized.  Current medicines were reviewed at length with the patient today.  Disposition: FU with me in 6 months.   Signed, Jonelle SidleSamuel G. Fredna Stricker, MD, [facility] 12/30/2014 11:20 AM    [facility] [address], [place], [place] [address] Phone: [phone]; Fax: [phone]

## 2014-12-30 NOTE — Patient Instructions (Signed)
Your physician wants you to follow-up in: 6 months with Dr Randa SpikeMcdowell You will receive a reminder letter in the mail two months in advance. If you don't receive a letter, please call our office to schedule the follow-up appointment.   Please reschedule your echocardiogram   Your physician recommends that you continue on your current medications as directed. Please refer to the Current Medication list given to you today.   If you need a refill on your cardiac medications before your next appointment, please call your pharmacy.    Thank you for choosing [facility] !

## 2015-01-03 ENCOUNTER — Ambulatory Visit (HOSPITAL_COMMUNITY)
Admission: RE | Admit: 2015-01-03 | Discharge: 2015-01-03 | Disposition: A | Discharge status: Home / Self Care | Payer: PPO | Source: Ambulatory Visit | Attending: Cardiology | Admitting: Cardiology

## 2015-01-03 DIAGNOSIS — E119 Type 2 diabetes mellitus without complications: Secondary | ICD-10-CM | POA: Insufficient documentation

## 2015-01-03 DIAGNOSIS — E785 Hyperlipidemia, unspecified: Secondary | ICD-10-CM | POA: Insufficient documentation

## 2015-01-03 DIAGNOSIS — I429 Cardiomyopathy, unspecified: Secondary | ICD-10-CM | POA: Diagnosis not present

## 2015-01-03 DIAGNOSIS — I10 Essential (primary) hypertension: Secondary | ICD-10-CM | POA: Insufficient documentation

## 2015-01-04 ENCOUNTER — Other Ambulatory Visit: Payer: Self-pay

## 2015-01-04 DIAGNOSIS — I255 Ischemic cardiomyopathy: Secondary | ICD-10-CM

## 2015-01-07 ENCOUNTER — Other Ambulatory Visit: Payer: Self-pay | Admitting: Cardiology

## 2015-01-07 ENCOUNTER — Ambulatory Visit (HOSPITAL_COMMUNITY)
Admission: RE | Admit: 2015-01-07 | Discharge: 2015-01-07 | Disposition: A | Discharge status: Home / Self Care | Payer: PPO | Source: Ambulatory Visit | Attending: Cardiology | Admitting: Cardiology

## 2015-01-07 DIAGNOSIS — E119 Type 2 diabetes mellitus without complications: Secondary | ICD-10-CM | POA: Diagnosis not present

## 2015-01-07 DIAGNOSIS — E785 Hyperlipidemia, unspecified: Secondary | ICD-10-CM | POA: Diagnosis not present

## 2015-01-07 DIAGNOSIS — I255 Ischemic cardiomyopathy: Secondary | ICD-10-CM | POA: Insufficient documentation

## 2015-01-07 MED ORDER — PERFLUTREN LIPID MICROSPHERE
1.0000 mL | INTRAVENOUS | Status: AC | PRN
  Administered 2015-01-07: 3 mL via INTRAVENOUS
  Filled 2015-01-07: qty 10

## 2015-01-07 NOTE — Progress Notes (Signed)
*  PRELIMINARY RESULTS* Echocardiogram 2D Echocardiogram limited with definity has been performed.  [name], [name] 01/07/2015, 2:37 PM

## 2015-01-11 ENCOUNTER — Telehealth: Payer: Self-pay | Admitting: Pulmonary Disease

## 2015-01-11 DIAGNOSIS — J449 Chronic obstructive pulmonary disease, unspecified: Secondary | ICD-10-CM

## 2015-01-11 NOTE — Telephone Encounter (Signed)
Pt needing new nebulizer machine - unable to get a new machine through [facility].  Pt states that she has been having trouble getting her medications through [facility] as well and is wanting to switch DME. Please advise [name] if you know of another way to get this patient a nebulizer.

## 2015-01-12 NOTE — Telephone Encounter (Signed)
Called spoke w/ pt. She reports her nebulizer is not working and won't even cut on. She reports she called [facility] and was told she received a new machine and pt does not recall this. I advised will call [facility] to see what is going on. Carol NA until 12/1. I spoke with Jonny RuizJohn from [facility]. He reports they have replaced pt machine twice now since 07/2013. They can supply pt wil a refurbished machine but pt will have to bring her neb machine to the office.  Jonny RuizJohn is going to call pt right now to explain this to her. Will wait and give pt a call back to ensure this was f/u on

## 2015-01-12 NOTE — Telephone Encounter (Signed)
[name] calling back please fax over a script pt does qualify for new neb   Fax # [phone]  Phone # [phone]

## 2015-01-12 NOTE — Telephone Encounter (Signed)
Order entered for new Nebulizer. Patient notified that we received call back from [name] at [facility] and that he advised us that she does qualify for a new Nebulizer.  Advised patient that order has been entered for the new nebulizer machine and that she should be getting a call from [facility] soon to get the new machine.  Patient grateful.  Nothing further needed. Closing encounter

## 2015-01-12 NOTE — Telephone Encounter (Signed)
Rx for Neb machine faxed to the number given to [name]'s attn Pt aware and nothing further needed

## 2015-01-12 NOTE — Patient Outreach (Signed)
[facility]N) Care Management  01/12/2015  [name] 10/08/1940 [id number]   Referral from HTA tier 4 list, assigned to [name], RN for patient outreach.  [name] [name], AAS [facility] Care Management Assistant

## 2015-01-19 ENCOUNTER — Other Ambulatory Visit: Payer: Self-pay | Admitting: *Deleted

## 2015-01-19 NOTE — Patient Outreach (Signed)
[facility]N) Care Management  01/19/2015  [name] 09/04/1940 [id number]  [employer] Tier 4 referral: Telephone call to patient; no answer and unable to leave message.   Plan: will follow up.  Colleen CanLinda Aubreana Cornacchia, RN BSN CCM Care Management Coordinator [facility] Care [facility]  [phone]

## 2015-01-20 ENCOUNTER — Other Ambulatory Visit: Payer: PPO | Admitting: *Deleted

## 2015-01-20 NOTE — Patient Outreach (Signed)
[facility](THN) Care Management  01/20/2015  [name] 04/04/1940 [id number]   Telephone call to patient who gave HIPPA verification.  Patient was advised of reason for call and of [facility] care [facility] services. Subjective:  Patient voices that health issue includes COPD that requires oxygen therapy around the clock, diabetes requiring insulin, kidney failure, CAD with 6 stents in place, hx mini-strokes, arthritis and neuropathy. States she sees primary care doctor, lung specialist(Dr. Kriste BasqueNadel), and cardiologist for medical care. States no problems with transportation or making appointments. States daughters provide support as needed and provide transportation to doctors' appointments.   Patient voices that she manages medications and is consistent with taking medications as prescribed by her doctors. Patient voices knowledge of why she is taking medications. Currently taking over 10 medications.  Patient voices that she checks blood sugar 4 times daily and self administers insulin. Has no knowledge of  hemoglobin A1c values. States she knows what to do if her blood sugar drops too low.    States she has no [employer] insurance or insurance for eye hardware so has not purchased eyeglasses or lower dentures. States she did not qualify for medicaid several years ago. Also states hard of hearing and unable to purchase hearing aid.  Patient consents to [facility] care management services.  Objectives; See listed medications in chart. See screening assessment as noted.  Assessment.: Knowledge deficit regarding diabetes. Taking over 10 medications daily. Need community resources as it relates to hearing aid, eyeglasses and dentures.  Plan: Send to care management assistant to refer to health coach, social worker and pharmacist.  Refer to refer to health Coach for disease management for diabetes. Refer to pharmacy to review for medications over 10. Refer to Child psychotherapistsocial worker for [employer]  resources for eyeglasses, dentures and hearing aid. Telephonic signing off.   Colleen CanLinda Aylana Hirschfeld, RN BSN CCM Care Management Coordinator [facility] Care Management  [phone]

## 2015-01-25 ENCOUNTER — Other Ambulatory Visit: Payer: Self-pay | Admitting: Licensed Clinical Social Worker

## 2015-01-25 ENCOUNTER — Encounter: Payer: Self-pay | Admitting: Licensed Clinical Social Worker

## 2015-01-25 NOTE — Patient Outreach (Signed)
Assessment:  CSW received referral on Indianna F. Nilsson. CSW completed chart review on client on 01/25/15.  CSW called Ria ClockMichael Reeves at [facility] office on 01/25/15 and asked Casimiro NeedleMichael to please mail a [facility] consent form to Albirta F. Bourcier for her to review.  Casimiro NeedleMichael said he would mail [name] consent form to Fantasha F. Chavana on 01/26/15.  CSW then called client home phone number and spoke via phone with client on 01/25/15.  CSW verified identity of client.  Client gave CSW verbal permission on 01/25/15 for CSW to speak with client about current needs of client.  Client said she takes numerous prescribed medications and sees several doctors who are providing medical care for client. She said her daughters transport her to and from her scheduled medical appointments.  She said she has her prescribed medications. CSW informed client that [facility] consent form would be mailed to client for her to review/read.  She said she would read/reivew [facility] consent form and said she would complete [facility] consent form and mail completed form to [facility] office in [place], [place]. She said she has ongoing pain issues and arthritis issues.  She said she has appointment with Dr. Olena LeatherwoodHasanaj on 02/11/15 and plans to talk with Dr. Horris LatinoHasanaji at 02/11/15 appointment about pain issues for client. She and CSW completed needed [facility] assessments for client. Client and CSW spoke of care plan for client and developed a care plan goal for client on 01/25/15.  CSW informed client that she had been referred also to [facility] Health Coach and [facility] pharmacist for support. Shekina said she would be glad to speak with [facility] Health Coach and would be glad to speak with [facility] pharmacist.  She said:  " I am on so many medications, I hope someone can help be figure all of them out."  She said she has financial challenges at present. She lives alone at her apartment. She said she uses oxygen at 3 liters via nasal canula continuously. She said she has nebulizer and has 4 nebulizer treatments each day. CSW gave client  the [facility] CSW phone number of [phone] and encouraged Jadence to call CSW as needed to discuss social work needs of client.  CSW thanked client for phone call with CSW on 01/25/15. Client was appreciative of phone call from CSW on 01/25/15.  Plan: Client to read and review [facility] consent form, complete [facility] consent form, and return completed [facility] consent form to [facility] office in [place], [place] . CSW to call client in one week to further assess needs of client at that time.  Kelton PillarMichael S.Braileigh Landenberger MSW, LCSW Licensed Clinical Social Worker [facility] Care Management [phone].

## 2015-01-26 ENCOUNTER — Encounter: Payer: Self-pay | Admitting: Pulmonary Disease

## 2015-01-26 ENCOUNTER — Ambulatory Visit (INDEPENDENT_AMBULATORY_CARE_PROVIDER_SITE_OTHER): Payer: PPO | Admitting: Pulmonary Disease

## 2015-01-26 ENCOUNTER — Encounter: Payer: Self-pay | Admitting: Licensed Clinical Social Worker

## 2015-01-26 VITALS — BP 134/70 | HR 75 | Temp 98.2°F | Wt 208.4 lb

## 2015-01-26 DIAGNOSIS — I10 Essential (primary) hypertension: Secondary | ICD-10-CM | POA: Diagnosis not present

## 2015-01-26 DIAGNOSIS — E118 Type 2 diabetes mellitus with unspecified complications: Secondary | ICD-10-CM

## 2015-01-26 DIAGNOSIS — G4733 Obstructive sleep apnea (adult) (pediatric): Secondary | ICD-10-CM

## 2015-01-26 DIAGNOSIS — E669 Obesity, unspecified: Secondary | ICD-10-CM

## 2015-01-26 DIAGNOSIS — Z9861 Coronary angioplasty status: Secondary | ICD-10-CM

## 2015-01-26 DIAGNOSIS — J9611 Chronic respiratory failure with hypoxia: Secondary | ICD-10-CM | POA: Diagnosis not present

## 2015-01-26 DIAGNOSIS — I2510 Atherosclerotic heart disease of native coronary artery without angina pectoris: Secondary | ICD-10-CM

## 2015-01-26 DIAGNOSIS — M150 Primary generalized (osteo)arthritis: Secondary | ICD-10-CM

## 2015-01-26 DIAGNOSIS — J4489 Other specified chronic obstructive pulmonary disease: Secondary | ICD-10-CM

## 2015-01-26 DIAGNOSIS — I214 Non-ST elevation (NSTEMI) myocardial infarction: Secondary | ICD-10-CM

## 2015-01-26 DIAGNOSIS — J449 Chronic obstructive pulmonary disease, unspecified: Secondary | ICD-10-CM | POA: Diagnosis not present

## 2015-01-26 DIAGNOSIS — I5181 Takotsubo syndrome: Secondary | ICD-10-CM

## 2015-01-26 DIAGNOSIS — M159 Polyosteoarthritis, unspecified: Secondary | ICD-10-CM

## 2015-01-26 MED ORDER — METHYLPREDNISOLONE ACETATE 80 MG/ML IJ SUSP: 80.0000 mg | Freq: Once | INTRAMUSCULAR | Status: DC

## 2015-01-26 MED ORDER — ALBUTEROL SULFATE HFA 108 (90 BASE) MCG/ACT IN AERS: 1.0000 | INHALATION_SPRAY | Freq: Four times a day (QID) | RESPIRATORY_TRACT | Status: AC | PRN

## 2015-01-26 MED ORDER — METHYLPREDNISOLONE 4 MG PO TBPK: ORAL_TABLET | ORAL | Status: DC

## 2015-01-26 NOTE — Patient Instructions (Signed)
Today we updated your med list in our EPIC system...    Continue your current medications the same...  We wrote for a "rescue inhaler" PROAIR-HFA take 1-2 inhalations every 6H as needed for wheezing...  Today we gave you a Depo shot & a new prescription for a MEDROL DOSEPAK to take as directed for the congestion...  Have a Happy Thanksgiving, Merry Christmas, and a Healthy New Year!!!  Call for any questions...  Let's plan a follow up visit in 74mo, sooner if needed for problems.Marland Kitchen..Marland Kitchen

## 2015-01-30 ENCOUNTER — Encounter: Payer: Self-pay | Admitting: Pulmonary Disease

## 2015-01-30 NOTE — Progress Notes (Signed)
Subjective:    Patient ID: [name], female    DOB: 1940/08/29, 74 y.o.   MRN: [id number]  HPI 74 y/o WF here for a follow up visit... [name] has 13 grandchildren & 18 great-grands!  She also has mult med problems including severe COPD; HBP; CAD w/prev MI & PTCA followed by DrMcDowell; VI; Hyperlipidemia; DM on insulin; Obesity; GERD/ constipation; DJD/ osteoporosis; Anxiety...   ~  SEE PREV EPIC NOTES FOR OLDER DATA >>    LABS 5/14:  FLP- not at goals w/ TG=227, HDL=33;  Chems- ok x BS=160, A1c=7.3;  CBC- wnl;  TSH=2.09...  LABS 12/14:  chems- ok x BS=203, A1c=7.6 on her Metform500Bid & 70/30 insulin 30uBid=> Rec to incr to 35uBid + diet & exercise...  CXR 3/15 showed cardiomeg, COPD/ atelectasis in lingula & LLL, DJD in Tspine & shoulders, NAD...  PFT 6/15 showed FVC=0.81 (28%), FEV1=0.43 (20%), %1sec=53%, mid-flows= 9% predicted... c/w GOLD Stage4 COPD.  ~  February 02, 2014:  17mo ROV & [name] has seen DrMcGowen several times- most recently 01/27/14 for a sick visit & felt to have a COPD exac- treaqted w/ Depo80, Pred taper, & Levaquin;  She states improved , breathing is better w/ min cough, whitish sput, & no f/c/s; she has baseline DOE- no better due to 17# wt gain & she admits to not dieting & not exercising! We reviewed diet, exercise & wt reduction strategies;  We walked her in the office on her O2 at 2L/min (see below);  We reviewed the following medical problems during today's office visit >>     HxSinusitis> on MMW, Flonase, OTC antihist prn; c/o chr congestion, & recent infection treated by DrMcGowen w/ Levaquin...    COPD (GOLD Stage4)> on O2 (Helios system- 2L/min continuously), NEBS w/ DuonebQid/ BudesBid, Mucinex2Bid; +recent exac as noted & improved on Levaquin & Pred taper; chr dyspnea- ok w/ ADLs in general but SOB w/ chores/ housework/ walking in the house, etc; she has gained wt, not dieting & not exercising- we read her the riot act!    BP is controlled on Aten50, Losar50,  Lasix40 & measures 138/82 today... She denies CP, palpit, ch in SOB, etc on her ASA81 & Imdur30...     Chol is regulated w/ Simva80 Qhs- last FLP 7/15 showed elev TG & she is reminded to follow a strict low fat diet & work on weight reduction...    DM control is fair on her Actos15 and 70/30 insulin taking 30u Bid per Primary Care; Labs 10/15 showed BS=156, A1c= 7.9; she will f/u w/ Primary care soon    She remains on Prilosec20, Boniva150, Neurontin, Lodine/Tramadol, Vicodin, Alprazolam as well...  We reviewed prob list, meds, xrays and labs> see below for updates >> she continues to REFUSE Flu shots and Pneumonia vaccine...  Oxygen saturation on 2L/min at rest= 95% w/ pulse=64/min;  After walking 3 laps her O2sat=93% w/ pulse=84/min... Rec to continue same.    ~  August 04, 2014:  17mo ROV & [name] was [facility] 4/20 - 06/29/14 by [facility] "I just got sick- a little bit of everything"> DCSummary says COPD exac w/ acute on chr resp failure, OSA on CPAP, NSTEMI, cardiomyopathy, DM, HL, RI, etc... 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  We reviewed the following medical problems during today's office visit >>     OSA> on CPAP but she wasn't using this prior to adm, she will use it regularly & bring machine in for a download at next visit, I cannot find initial sleep study or Sleep Med notes in epic...    HxSinusitis> on MMW, Flonase, OTC antihist prn; c/o chr congestion, & prev infection treated by DrMcGowen w/ Levaquin...    COPD (GOLD Stage4)> on O2 (Helios system- 2L/min continuously), NEBS w/ DuonebQid/ BudesBid, Mucinex2Bid; +recent Hosp w/ acute on chr  resp failure as noted & improved on Levaquin & Pred taper (off now); chr dyspnea- ok w/ ADLs in general but SOB w/ chores/ housework/ walking in the house, etc; pCO2 in the 50 range but up to 80 prior to intubation 4/16; she has gained wt, not dieting & not exercising- we read her the riot act!    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    BP is controlled on above adjusted meds & measures 104/58 today... She denies CP, palpit, ch in SOB, etc on her ASA81 & Imdur30...     Chol is regulated w/ Atorva40 now- last FLP 7/15 showed elev TG & she is reminded to follow a strict low fat diet & work on weight reduction, & f/u w/ PCP & Cards...    DM control is poor on her 70/30 insulin taking 25u Bid per Primary Care; Labs 4/16 showed BS=72-338, A1c= 10.2; she will f/u w/ her PCP soon...    Obesity> she diureses to 98 Kg (216#) during her Hosp; now weighs 219.6 (BMI=37) and she desperately needs to lose the weight...    She remains on Prilosec20, [id number], Alprazolam; but off prev Neurontin, Lodine/Tramadol, Vicodin... We reviewed prob list, meds, xrays and labs> [facility] reviewed...  Uses [employer] for neb meds and supplies, ?CPAP as well?  CXR 5/16 showed cardiomeg, bibasilar atx vs infiltrate-- treated w/ Levaquin PLAN>>  Continue current regimen, bring all med bottles to every visit, needs to use CPAP nightly & bring machine for check & download if poss...   ~  August 31, 2014:  4wk ROV & [name] did not bring med list or bottles to today's visit; furthermore she tells me that she refused the CPAP- doesn't like the mask/ machine/ etc and refuses to work w/ Sleep Med to help  tolerabliity/ interface problems, etc... She doesn't know her meds and is again asked to keep accurate med list handy w/ any changes from her diff providers...    OSA> she refuses CPAP or Sleep Med consult to help w/ tolerability...    HxSinusitis> on MMW, Flonase, OTC antihist prn; c/o chr congestion, & prev infection treated by DrMcGowen w/ Levaquin...    COPD (GOLD Stage4)> on O2 (Helios system- 2L/min continuously), NEBS w/ DuonebQid/ BudesBid, Mucinex2Bid; +recent Hosp w/ acute on chr resp failure as noted & improved on Levaquin & Pred taper (off now); chr dyspnea- ok w/ ADLs in general but SOB w/ chores/ housework/ walking in the house, etc; pCO2 in the  50 range but up to 80 prior to intubation 4/16; she has gained wt up to 220#, not dieting & not exercising- we begged her to get on diet, increase exercise, and get wt down...    She is overdue for CARDS f/u & she will call DrMcDowell's office to set this up; her PCP changed Demadex to Furosemide 40mg /d... We reviewed prob list, meds, xrays and labs>  PLAN>>  Continue current regimen & call for questions; reminded to bring all med bottles to each & every doctor visit; needs to incr her exercise program & get wt down! she will call for CARDS f/u appt & see me back in 6-8wks...  ~  October 26, 2014:  8mo ROV & [name] indicates "I'm-a-doin" but notes that she was [facility] at [facility] 7/31 - 10/06/14 with a "mini-stroke" she says; they had stopped her ASA prior to the event & it was restarted after the episode;  She notes that her breathing is OK & she says they told her that her CXR was fine- on NEBS w/ Brovana & Pulmicort BID, plus Albuterol&Atrovent nebs & Mucinex prn... She remains on Demadex20Bid & Diamox250Bid (see BMet below)... She is followed by DrMcDowell in the [place] [facility] office- his note from 09/22/14 is reviewed...  EXAM shows Afeb, VSS, O2sat=95% on 2-3L by [place];  HEENT- neg, mallampati4;  Chest- decr BS at bases, few scat rhonchi, w/o  w/r/consolidation;  Abd- obese, soft, neg;  Ext- VI, tr edema, no c/c;  Neuro- no focal neuro deficits...  LABS 10/2014:  Chems-  HCO3=33, K=3.8,  BUN=23, Cr=1.28 -- stable, continue same meds...  IMP/PLAN>>  She is stable from the pulmonary standpoint, continue same meds, encouraged incr activity/ exercise; ROV in  25mo sooner if needed...  ADDENDUM>>  10/30/14 CT Chest, Abd, Pelvis after a fall showed mild cardiomeg, aortic & coronary calcif, bibasilar atx vs fibrosis. NAD; s/p GB, calcif Ao, bilat renal stones, subxiphoid abd wall hernia containing fat, s/p Hyst, norm appendix, DJD spine w/ ant compression of T2 & L4...   ~  January 26, 2015:  25mo ROV & [name] is about the same but c/o incr congestion despite the Mucinex 2Bid & fluids, it is worst in the AM & she is reminded to keep a NEB rx handy to use when she wakes up & continue all meds regularly etc;  We decided to treat w/ Depo80, Medrol dosepak, & refill her Proair for prn use when out & about... Epic records indicate aot of THN activity;  Her PCP is DrHusain...    OSA> she refuses CPAP or Sleep Med consult to help w/ interface tolerability; states she is sleeping OK...    HxSinusitis> on MMW, Flonase, OTC antihist prn; c/o chr congestion, & prev infection treated w/ Levaquin...    COPD (GOLD Stage4- FEV1=20%> on O2 (Helios system- 2L/min continuously), NEBS w/ DuonebQid/ BudesBid, Mucinex2Bid; prev Hosp (4/16) w/ acute on chr resp failure as noted & improved on Levaquin & Pred taper (off now); chr dyspnea- ok w/ ADLs in general but SOB w/ chores/ housework/ walking in the house, etc; pCO2 in the 50 range but up to 80 prior to intubation 4/16; wt is down to 208#, but not dieting & not exercising- we begged her to get on diet, increase exercise, and continue to work on weight reduction...    She saw DrMcDowell for Cards 10/27> CAD w/ prev LAD intervention, hx takotsubo cardiomyopathy w/ prev EF=30-35%; she denied CP, SOB w/o change, feeling well;  repeat  contrast enhanced 2DEcho 11/4 showed improved LVF w/ EF=50-55% & normal wall motion... EXAM shows Afeb, VSS, O2sat=98% on 3L;  HEENT- neg, mallampati4;  Chest- decr BS bilat, end exp wheeze w/o consolidation;  Heart- RR gr1/6 SEM w/o r/g;  Abd- obese, soft, neg;  Ext- VI w/o c/c/e...  2DEcho 01/03/15 showed mild LVH, reduced LVF w/ EF=40-45%, Gr1DD, there was a regional wall motion abn w/ mod HK in apex & mild HK in other areas, septal dysynergy...  Contrast enhanced 2DEcho 01/07/15 showed NORMAL LVF w/ EF=50-55% & no regional wall motion abnormalities... IMP/PLAN>>  [name] has some increased congestion & we discussed Rx w/ Depo80 + Medrol dosepak;  LVF is improved, breathing is stable, medical management from [facility] (her PCP)... We will continue w/ 109mo ROVs.         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 Rx'd w/ MUCINEX, Nasal Saline, FLONASE...   ~  2/11:  c/o sinusitis & AUGMENTIN perscribed. ~  9/13:  ER visit w/ jaw pain- had wisdom tooth extracted 6/13 by DrLewis in [place]; CT showed socket left by right mandib molar extraction w/ inflamm; given PenVK, Vicodin, told f/u w/ dentist...  COPD (ICD-496) - she has COPD and is an ex-smoker... controlled on NEBULIZER w/ Duoneb Qid + Budesonide Bid, MUCINEX, etc... she has end stage disease w/ severe dyspnea w/ ADLs and no change over the last yr... ~  baseline CXR = COPD, incr heart size, NAD. ~ CT Chest 2/02 showed COPD, Atx, sl scarring, NAD. ~  bronchoscopy 3/02 w/ mucous plugging, no endobr lesions. ~  PFT's in 12/07 showed FVC=0.81 (27%), FEV1=0.54 (22%), %1sec=67, mid-flows=13%pred. ~  CXR 1/10 showed chr changes, cardiomeg, lingular atx. ~  CXR 2/11 showed cardiomeg, bibasilar scarring, NAD. ~  CXR 2/12 showed cardiomeg, COPD,scarring left base, NAD. ~  CXR 5/14 showed cardiomeg, mild basilar atx/ flattened diaph/ NAD, DJD in Tspine, s/p chole... ~   9/14:  Presents w/ URI & AB exac- unresponsive to Augmentin called in, & rec Levaquin, Depo, Dosepak, Mucinex, Fluids, etc... ~  CXR 3/15 showed cardiomeg, COPD/ atelectasis in lingula & LLL, DJD in Tspine & shoulders, NAD... ~  6/15: on O2 (Helios- 2L/min continuously), NEBS w/ DuonebQid/ BudesBid, Mucinex2Bid; stable w/o exac but at baseline has cough, thick beige sput, no hemoptysis, & chr dyspnea- ok w/ ADLs in general but SOB w/ chores/ housework/ walking in the house, etc...  ~  PFT 6/15 showed FVC=0.81 (28%), FEV1=0.43 (20%), %1sec=53%, mid-flows= 9% predicted... c/w GOLD Stage4 COPD ~  12/15: she had a recent infectious exac treated by DrMcGowen w/ Levaquin & Pred; she is improved & approaching baseline, however she has gained 17# & not on diet & not exercising; we discussed need for diet, exercise & wt reduction; she will finish the Levaquin & Pred taper and continue the O2, NEBS, Mucinex, etc...  ~  12/15> Oxygen saturation on 2L/min at rest= 95% w/ pulse=64/min;  After walking 3 laps her O2sat=93% w/ pulse=84/min... Rec to continue same. 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  ~  Fall2016> she is stable on rx w/ on O2 (Helios system- 2L/min continuously), NEBS w/ DuonebQid/ BudesBid, Mucinex2Bid, etc...   SHE HAS ESTABLISHED PRIMARY CARE w/ Dr. Annette Stable A. Hasanaj in [place] & CARDIOLOGY w/ DrMcDowell >>   HYPERTENSION (ICD-401.9) - on ATENOLOL /d, LOSARTAN /d, LASIX /d... BP=126/80 and tol well... denies HA, visual  changes, CP, palipit, dizziness, syncope, change in dyspnea, edema, etc.  CORONARY ARTERY DISEASE (ICD-414.00) - on ASA /d & Imdur30... hx of CAD w/ prev MI in 2000, and subseq PTCA's in 2000, & 2001... 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 ~  She saw DrMcDowell for Cards 10/27> CAD w/ prev LAD intervention, hx takotsubo cardiomyopathy w/ prev EF=30-35%; she denied CP, SOB w/o change, feeling well; repeat contrast enhanced 2DEcho 11/4 showed improved LVF w/ EF=50-55% & normal wall motion.  VENOUS INSUFFICIENCY (ICD-459.81) - low sodium diet, elevation, & Lasix 40-80mg /d... states she "allergic" to TED hose! and refuses to wear them...   HYPERLIPIDEMIA (ICD-272.4) - ZOCOR 80 changed to ATORVA40 in hosp 4/16 + FISH OIL /d ... she has been counselled on diet & exercise but unsuccessful...  DM (ICD-250.00) - HUMULIN 70/30- now taking 25uAM & 25uPM she says (off Actos due to cardiomyopathy) but control is poor w/ A1c>10; she has f/u w/ PCP soon...  OBESITY (ICD-278.00) - weight = 219# (BMI=37); we discussed diet + exercise program required to lose weight!  GERD (ICD-530.81) - on PPI therapy (changed 2/10 per request to OMEPRAZOLE20mg /d)... ~  [facility] 3/12 for COPD exac & was anemic w/ heme pos stool & GIB; EGD by DrJacobs w/ gastritis & prepyloric ulcers, +HPylori- treated & improved.  CONSTIPATION (ICD-564.00) - last colonoscopy was 11/07 by DrJacobs and negative...  DEGENERATIVE JOINT DISEASE (ICD-715.90) - on Vicodin, Trmaadol, Neurontin; prev CSpine films w/ spondy and biforaminal narrowing... LSSpine films w/ DDD and osteopenia...   OSTEOPOROSIS (ICD-733.00) - on BONIVA /mo, Calcium, Vits...   ANXIETY (ICD-300.00) - uses Alprazolam 0.5mg  as  needed...  Hx ANEMIA, MILD (ICD-285.9)  Health Maintenance - she hasn't seen GYN in yrs and is asked to re-establish... prev refused Mammograms due to the discomfort & bruising- she will discuss w/ Gyn & get a follow up BMD as well...   Past Surgical History  Procedure Laterality Date  . Total abdominal hysterectomy      DUB (non-malignant reasons).  No hx of abnormal pap smears.  . Shoulder open rotator cuff repair Right   . Cataract extraction w/phaco Left 06/30/2012    Procedure: CATARACT EXTRACTION PHACO AND INTRAOCULAR LENS PLACEMENT (IOC);  Surgeon: Gemma Payor, MD;  Location: [facility];  Service: Ophthalmology;  Laterality: Left;  CDE:12.32  . Transthoracic echocardiogram  06/2013    EF 40%, + WMA  . Alveoloplasty  10/1999    [name] 07/19/2010 Surgical extraction of root segments #21, 22 and 27 with alveoloplasty.  . Cardiac catheterization  07/2012    + progressive CAD but no lesions to intervene upon, EF 65%; med mgmt continued  . Coronary angioplasty with stent placement      PTCA/BMS LAD 2000, PTCA/brachytherapy LAD 2001, LVEF 55%   . Coronary angioplasty with stent placement  2000; 2001;  2002    [name] 05/18/2010  . Tonsillectomy  1949  . Laparoscopic cholecystectomy    . Cataract extraction w/ intraocular lens implant Right 2015?  Marland Kitchen Left heart catheterization with coronary angiogram N/A 06/25/2014    Procedure: LEFT HEART CATHETERIZATION WITH CORONARY ANGIOGRAM;  Surgeon: Lyn Records, MD;  Location: [facility] CATH LAB;  Service: Cardiovascular;  Laterality: N/A;    Outpatient Encounter Prescriptions as of 01/26/2015  Medication Sig  . acetaminophen (TYLENOL) 650 MG suppository Place 650 mg rectally every 4 (four) hours as needed.  Marland Kitchen acetaZOLAMIDE (DIAMOX) 250 MG tablet Take 250 mg by mouth 2 (two) times daily.  Marland Kitchen albuterol (PROVENTIL) (2.5 MG/3ML) 0.083% nebulizer solution Take 3 mLs (2.5 mg total) by nebulization 4 (four) times daily.  Marland Kitchen ALPRAZolam (XANAX) 0.5 MG tablet Take 1  tablet (0.5 mg total) by mouth 3 (three) times daily.  Marland Kitchen arformoterol (BROVANA) 15 MCG/2ML NEBU Take 2 mLs (15 mcg total) by nebulization 2 (two) times daily.  Marland Kitchen aspirin 81 MG tablet Take 81 mg by mouth daily.  . bisoprolol (ZEBETA) 5 MG tablet Take 0.5 tablets (2.5 mg total) by mouth daily.  . budesonide (PULMICORT) 0.25 MG/2ML nebulizer solution Take 2 mLs (0.25 mg total) by nebulization 2 (two) times daily.  . calcium-vitamin D (OSCAL WITH D) 500-200 MG-UNIT per tablet Take 1 tablet by mouth 2 (two) times daily.    . Cholecalciferol (VITAMIN D3) 1000 UNITS CAPS Take 1 capsule by mouth daily.   Marland Kitchen dextromethorphan-guaiFENesin (MUCINEX DM) 30-600 MG per 12 hr tablet Take 2 tablets by mouth every 12 (twelve) hours.    . diphenoxylate-atropine (LOMOTIL) 2.5-0.025 MG per tablet Take 1 tablet by mouth 4 (four) times daily as needed for diarrhea or loose stools.  . ferrous sulfate 325 (65 FE) MG tablet Take 325 mg by mouth daily with breakfast.   . glucose blood (ONE TOUCH ULTRA TEST) test strip TEST BLOOD SUGAR LEVELS THREE TIMES DAILY  . HYDROcodone-acetaminophen (NORCO/VICODIN) 5-325 MG per tablet Take 1 tablet by mouth 2 (two) times daily as needed for moderate pain.  Marland Kitchen ibandronate (BONIVA) 150 MG tablet TAKE 1 TABLET EVERY MONTH ON THE SAME DAY  . insulin aspart protamine - aspart (NOVOLOG MIX 70/30 FLEXPEN) (70-30) 100 UNIT/ML FlexPen Inject 0.25 mLs (25 Units total) into the skin 2 (two) times daily with a meal. E11.8  . Insulin Lispro, Human, (HUMALOG [place]) Inject into the skin.  Marland Kitchen ipratropium (ATROVENT) 0.02 % nebulizer solution Take 2.5 mLs (500 mcg total) by nebulization 4 (four) times daily.  . isosorbide mononitrate (IMDUR) 30 MG 24 hr tablet TAKE 1 TABLET DAILY  . losartan (COZAAR) 50 MG tablet TAKE ONE TABLET BY MOUTH ONCE DAILY  . Multiple Vitamin (MULTIVITAMIN) tablet Take 1 tablet by mouth daily.  . nitroGLYCERIN (NITROSTAT) 0.4 MG SL tablet Place 1 tablet (0.4 mg total) under the  tongue every 5 (five) minutes as needed. May repeat x3  . omeprazole (PRILOSEC) 20 MG capsule TAKE ONE CAPSULE BY MOUTH ONCE DAILY 30 MINUTES BEFORE A MEAL  . ONE TOUCH LANCETS MISC Check blood sugars two times daily  . potassium chloride (K-DUR,KLOR-CON) 10 MEQ tablet TAKE 1 TABLET DAILY  . Probiotic Product (PROBIOTIC DAILY PO) Take 1 tablet by mouth daily.   . simvastatin (ZOCOR) 80 MG tablet Take 80 mg by mouth daily.  . sodium chloride (OCEAN) 0.65 % SOLN nasal spray Place 1 spray into both nostrils 2 (two) times daily as needed for congestion.  . torsemide (  DEMADEX) 20 MG tablet Take 20 mg by mouth daily.  Lauris Poag INSULIN SYRINGE 30G X 5/16" 0.5 ML MISC USE 3 TIMES A DAY AS DIRECTED  . vitamin B-12 (CYANOCOBALAMIN) 500 MCG tablet Take 500 mcg by mouth daily.   . vitamin C (ASCORBIC ACID) 500 MG tablet Take 500 mg by mouth daily.     Allergies  Allergen Reactions  . Codeine Nausea And Vomiting    vomiting  . Terbutaline Sulfate     REACTION: INTOL to terbutaline    Current Medications, Allergies, Past Medical History, Past Surgical History, Family History, and Social History were reviewed in [employer] record.    Review of Systems         See HPI - all other systems neg except as noted...   The patient complains of dyspnea on exertion, peripheral edema, muscle weakness, and difficulty walking.  The patient denies anorexia, fever, weight loss, weight gain, vision loss, decreased hearing, hoarseness, chest pain, syncope, prolonged cough, headaches, hemoptysis, abdominal pain, melena, hematochezia, severe indigestion/heartburn, hematuria, incontinence, suspicious skin lesions, transient blindness, depression, unusual weight change, abnormal bleeding, enlarged lymph nodes, and angioedema.    Objective:   Physical Exam    WD, Obese, 74 y/o WF in NAD... GENERAL:  Alert & oriented; pleasant & cooperative...  HEENT:  [place]/AT, EOM-wnl, PERRLA, EACs-clear,  TMs-wnl, NOSE-clear, THROAT-clear & wnl. NECK:  Supple w/ fairROM; no JVD; normal carotid impulses w/o bruits; no thyromegaly or nodules palpated; no lymphadenopathy. CHEST:  Decr BS bilat, few scat rhonchi, without wheezing, rales, or rubs...upper airway psuedo-wheezing HEART:  Regular Rhythm;  gr 1/6 SEM without rubs or gallops detected... ABDOMEN:  Obese w/ panniculus, soft & nontender; normal bowel sounds; no organomegaly or masses detected. EXT: without deformities, mild arthritic changes; no varicose veins/ +venous insuffic/ tr edema. Sl tender on palpation over lumbar area & siatic notch... NEURO:  CN's intact; no focal neuro deficits... DERM:  no lesion seen...  RADIOLOGY DATA:  Reviewed in the EPIC EMR & discussed w/ the patient...  LABORATORY DATA:  Reviewed in the EPIC EMR & discussed w/ the patient...   Assessment & Plan:    COPD>  continues home Oxygen, Nebulizer, Mucinex, & Fluids;  Recent Hosp as above, now back on maint meds... OSA>  Can't find old data, sleep med notes, any download data, etc... She was not using CPAP for yrs... She will bring machine, may need to start over (addendum- she flat out refuses to use the CPAP).   HBP>  Good control on Atenolol50, Losartan50, Furosemide40; discussed diet, must get wt down, no sodium... CAD>  Followed by DrMcDowell in [place] office- recent cath reviewed, no option for surg or intervention, continue medical management... Takotsubo Cardiomyopathy> see above (4/16 Hosp)   HYPERLIPID>  On Simva80 now + Fish Oil; needs better diet & get wt down!  DM>  Control remains poor as she has been unable to diet effectively, lose weight, etc; now on 70/30 insulin taking 30u Bid & A1c is 7.6 therefore rec incr to 35u Bid...  OBESITY>  Weight reduction is key to improvement in all areas...  GI> GERD, Ulcers, +HPylori, Constip>  Improved after HPylori Rx & back on Omep 20mg /d...  DJD, LBP, etc>  Lumbar XRay reviewed, trial Tramadol,  Robaxin, rest/ heat/ etc... Refer to Ortho for further eval & Rx...  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  Anxiety>  Alprazolam for prn use- refilled.Marland KitchenMarland Kitchen  Anemia>  On Fe daily and Hg is improved (s/p hosp w/ GIB)...   Patient's Medications  New Prescriptions   ALBUTEROL (PROAIR HFA) 108 (90 BASE) MCG/ACT INHALER    Inhale 1-2 puffs into the lungs every 6 (six) hours as needed for wheezing or shortness of breath.   METHYLPREDNISOLONE (MEDROL DOSEPAK) 4 MG TBPK TABLET    Use as directed  Previous Medications   ACETAMINOPHEN (TYLENOL) 650 MG SUPPOSITORY    Place 650 mg rectally every 4 (four) hours as needed.   ACETAZOLAMIDE (DIAMOX) 250 MG TABLET    Take 250 mg by mouth 2 (two) times daily.   ALBUTEROL (PROVENTIL) (2.5 MG/3ML) 0.083% NEBULIZER SOLUTION    Take 3 mLs (2.5 mg total) by nebulization 4 (four) times daily.   ALPRAZOLAM (XANAX) 0.5 MG TABLET    Take 1 tablet (0.5 mg total) by mouth 3 (three) times daily.   ARFORMOTEROL (BROVANA) 15 MCG/2ML NEBU    Take 2 mLs (15 mcg total) by nebulization 2 (two) times daily.   ASPIRIN 81 MG TABLET    Take 81 mg by mouth daily.   BISOPROLOL (ZEBETA) 5 MG TABLET    Take 0.5 tablets (2.5 mg total) by mouth daily.   BUDESONIDE (PULMICORT) 0.25 MG/2ML NEBULIZER SOLUTION    Take 2 mLs (0.25 mg total) by nebulization 2 (two) times daily.   CALCIUM-VITAMIN D (OSCAL WITH D) 500-200 MG-UNIT PER TABLET    Take 1 tablet by mouth 2 (two) times daily.     CHOLECALCIFEROL (VITAMIN D3) 1000 UNITS CAPS    Take 1 capsule by mouth daily.    DEXTROMETHORPHAN-GUAIFENESIN (MUCINEX DM) 30-600 MG PER 12 HR TABLET    Take 2 tablets by mouth every 12 (twelve) hours.     DIPHENOXYLATE-ATROPINE (LOMOTIL) 2.5-0.025 MG PER TABLET    Take 1 tablet by mouth 4 (four) times daily as needed for diarrhea or loose stools.   FERROUS SULFATE 325 (65 FE) MG TABLET    Take 325 mg by mouth daily with breakfast.    FUROSEMIDE (LASIX) 40 MG TABLET     Take 40 mg by mouth daily.   GLUCOSE BLOOD (ONE TOUCH ULTRA TEST) TEST STRIP    TEST BLOOD SUGAR LEVELS THREE TIMES DAILY   HYDROCODONE-ACETAMINOPHEN (NORCO/VICODIN) 5-325 MG PER TABLET    Take 1 tablet by mouth 2 (two) times daily as needed for moderate pain.   IBANDRONATE (BONIVA) 150 MG TABLET    TAKE 1 TABLET EVERY MONTH ON THE SAME DAY   INSULIN ASPART PROTAMINE - ASPART (NOVOLOG MIX 70/30 FLEXPEN) (70-30) 100 UNIT/ML FLEXPEN    Inject 0.25 mLs (25 Units total) into the skin 2 (two) times daily with a meal. E11.8   INSULIN LISPRO, HUMAN, (HUMALOG [place])    Inject into the skin.   IPRATROPIUM (ATROVENT) 0.02 % NEBULIZER SOLUTION    Take 2.5 mLs (500 mcg total) by nebulization 4 (four) times daily.   ISOSORBIDE MONONITRATE (IMDUR) 30 MG 24 HR TABLET    TAKE 1 TABLET DAILY   LOSARTAN (COZAAR) 50 MG TABLET    TAKE ONE TABLET BY MOUTH ONCE DAILY   MULTIPLE VITAMIN (MULTIVITAMIN) TABLET    Take 1 tablet by mouth daily.   NITROGLYCERIN (NITROSTAT) 0.4 MG SL TABLET    Place 1 tablet (0.4 mg total) under the tongue every 5 (five) minutes as needed. May repeat x3   OMEPRAZOLE (PRILOSEC) 20 MG CAPSULE    TAKE ONE CAPSULE BY MOUTH ONCE DAILY 30 MINUTES BEFORE  A MEAL   ONE TOUCH LANCETS MISC    Check blood sugars two times daily   POTASSIUM CHLORIDE (K-DUR,KLOR-CON) 10 MEQ TABLET    TAKE 1 TABLET DAILY   PROBIOTIC PRODUCT (PROBIOTIC DAILY PO)    Take 1 tablet by mouth daily.    SIMVASTATIN (ZOCOR) 80 MG TABLET    Take 80 mg by mouth daily.   SODIUM CHLORIDE (OCEAN) 0.65 % SOLN NASAL SPRAY    Place 1 spray into both nostrils 2 (two) times daily as needed for congestion.   TORSEMIDE (DEMADEX) 20 MG TABLET    Take 20 mg by mouth daily.   TRUEPLUS INSULIN SYRINGE 30G X 5/16" 0.5 ML MISC    USE 3 TIMES A DAY AS DIRECTED   VITAMIN B-12 (CYANOCOBALAMIN) 500 MCG TABLET    Take 500 mcg by mouth daily.    VITAMIN C (ASCORBIC ACID) 500 MG TABLET    Take 500 mg by mouth daily.  Modified Medications   No  medications on file  Discontinued Medications   ATORVASTATIN (LIPITOR) 40 MG TABLET    Take 1 tablet (40 mg total) by mouth daily at 6 PM.

## 2015-02-01 ENCOUNTER — Other Ambulatory Visit: Payer: Self-pay | Admitting: Licensed Clinical Social Worker

## 2015-02-01 ENCOUNTER — Encounter: Payer: Self-pay | Admitting: Licensed Clinical Social Worker

## 2015-02-01 NOTE — Patient Outreach (Signed)
Assessment:  CSW called client on 02/01/15 and spoke via phone with client on 02/01/15. CSW verified identity of client. Princesa and CSW spoke of client needs.  She said she has appointment with Dr. Olena LeatherwoodHasanaj  on 02/11/15. She said her daughters transport her to and from medical appointments. She said she has adeuate food suppy and that her church helps her with needed food. She said she has her medications and is taking medications as prescribed.She said she is taking xanax as prescribed and that this medication is helping her manage her anxiety symptoms.  She said she does get anxious periodically. She said she is getting calls from debt collectors and this is stressful to her. She said she has limited finances and has difficulty paying her monthly bills.  She said she likes to relax by taking a walk, taking a nap, watching TV, or by talking via phone with family and friends. CSW encouraged Tevis to continue using her relaxation techniques to help her manage anxiety and stress symptoms. She said she has difficulty walking. She does use a cane to help her ambulate. CSW gave client information about [employer] [phone]) and encouraged her to call [employer] to request application for eye glasses assistance be mailed to her home.  She said she would call number for [employer] to request application for eye frame assistance be mailed to her home. CSW gave her information about [employer] ([phone]) and encouraged her to call that agency to speak with representative about dental assistance for client.  Client said she several teeth needing extraction.  She wrote down information about [employer] and about [employer].  She said her medical doctor had encouraged her to reduce stress,and try to not get in stressful situations since she has had history of heart problems and strokes.  She and CSW spoke of her care plan goal of attending all scheduled medical appointments for  client in next 30 days.  CSW gave client [facility] CSW number of [phone]. Client also wrote down phone number of CSW. CSW informed client that a [facility] Health Coach had been referred for client assistance and support. Ramina said she would be glad to speak with [facility] Health Coach assigned. CSW spoke with Alleta about [facility] consent form. This form was recently mailed to client. She said she would complete [facility] consent form and mail it back to [facility] in [place], [place].   Plan: Client to attend scheduled medical appointments for client in next 30 days.   CSW to call client in three weeks to assess client needs.   Kelton PillarMichael S.Rudi Knippenberg MSW, LCSW Licensed Clinical Social Worker [facility] Care Management [phone]  Plan:

## 2015-02-02 ENCOUNTER — Other Ambulatory Visit: Payer: Self-pay

## 2015-02-02 NOTE — Patient Outreach (Signed)
[facility](THN) Care Management  02/02/2015  Rogenia Maryln GottronF Whitmyer 11/13/1940 [id number]   Telephone call for Initial Health Coach outreach.  Patient reports she is cooking her breakfast and would like a call later.  Advised patient that health coach would call her later.  She verbalized understanding.    Plan: RN Health Coach will attempt patient later.  Bary Lericheionne J Raylyn Speckman, RN, MSN [facility] Care Management RN Telephonic Health Coach [phone]

## 2015-02-02 NOTE — Patient Outreach (Signed)
[facility]) Care Management  [facility] Care Manager  02/02/2015   [name] Jan 25, 1941 [id number]  Subjective: Telephone call to patient for initial health coach outreach.  Patient states she is doing so-so.  Patient lives alone but daughters call to check on her frequently.  Patient independent with activities of daily living but she states it takes her a while.  Patient reports her daughters take her to her appointments.  Next Primary appointment 02-11-15.  Patient not prepared to review medications.  Advised patient we would review on next call.  She verbalized understanding.   Diabetes: Patient reports recently her sugars have been elevated as she was on steroids. Last blood sugar was 327 at lunch time.  Discussed with patient the affect steroids have on her blood sugar.  Also discussed with patient the importance of limiting carbohydrates in order to control blood sugar.  She verbalized understanding.    Objective:   Current Medications:  Current Outpatient Prescriptions  Medication Sig Dispense Refill  . acetaminophen (TYLENOL) 650 MG suppository Place 650 mg rectally every 4 (four) hours as needed.    Marland Kitchen acetaZOLAMIDE (DIAMOX) 250 MG tablet Take 250 mg by mouth 2 (two) times daily.    Marland Kitchen albuterol (PROAIR HFA) 108 (90 BASE) MCG/ACT inhaler Inhale 1-2 puffs into the lungs every 6 (six) hours as needed for wheezing or shortness of breath. 18 g 2  . albuterol (PROVENTIL) (2.5 MG/3ML) 0.083% nebulizer solution Take 3 mLs (2.5 mg total) by nebulization 4 (four) times daily. 75 mL 11  . ALPRAZolam (XANAX) 0.5 MG tablet Take 1 tablet (0.5 mg total) by mouth 3 (three) times daily. 90 tablet 0  . arformoterol (BROVANA) 15 MCG/2ML NEBU Take 2 mLs (15 mcg total) by nebulization 2 (two) times daily. 120 mL 5  . aspirin 81 MG tablet Take 81 mg by mouth daily.    . bisoprolol (ZEBETA) 5 MG tablet Take 0.5 tablets (2.5 mg total) by mouth daily. 45 tablet 0  . budesonide (PULMICORT) 0.25  MG/2ML nebulizer solution Take 2 mLs (0.25 mg total) by nebulization 2 (two) times daily. 60 mL 11  . calcium-vitamin D (OSCAL WITH D) 500-200 MG-UNIT per tablet Take 1 tablet by mouth 2 (two) times daily.      . Cholecalciferol (VITAMIN D3) 1000 UNITS CAPS Take 1 capsule by mouth daily.     Marland Kitchen dextromethorphan-guaiFENesin (MUCINEX DM) 30-600 MG per 12 hr tablet Take 2 tablets by mouth every 12 (twelve) hours.      . diphenoxylate-atropine (LOMOTIL) 2.5-0.025 MG per tablet Take 1 tablet by mouth 4 (four) times daily as needed for diarrhea or loose stools. 30 tablet 0  . ferrous sulfate 325 (65 FE) MG tablet Take 325 mg by mouth daily with breakfast.     . furosemide (LASIX) 40 MG tablet Take 40 mg by mouth daily.    Marland Kitchen glucose blood (ONE TOUCH ULTRA TEST) test strip TEST BLOOD SUGAR LEVELS THREE TIMES DAILY 100 each 5  . HYDROcodone-acetaminophen (NORCO/VICODIN) 5-325 MG per tablet Take 1 tablet by mouth 2 (two) times daily as needed for moderate pain.    Marland Kitchen ibandronate (BONIVA) 150 MG tablet TAKE 1 TABLET EVERY MONTH ON THE SAME DAY 1 tablet 1  . insulin aspart protamine - aspart (NOVOLOG MIX 70/30 FLEXPEN) (70-30) 100 UNIT/ML FlexPen Inject 0.25 mLs (25 Units total) into the skin 2 (two) times daily with a meal. E11.8 15 mL 11  . Insulin Lispro, Human, (HUMALOG [place]) Inject into  the skin.    Marland Kitchen ipratropium (ATROVENT) 0.02 % nebulizer solution Take 2.5 mLs (500 mcg total) by nebulization 4 (four) times daily. 75 mL 11  . isosorbide mononitrate (IMDUR) 30 MG 24 hr tablet TAKE 1 TABLET DAILY 90 tablet 1  . losartan (COZAAR) 50 MG tablet TAKE ONE TABLET BY MOUTH ONCE DAILY 30 tablet 3  . methylPREDNISolone (MEDROL DOSEPAK) 4 MG TBPK tablet Use as directed 21 tablet 0  . Multiple Vitamin (MULTIVITAMIN) tablet Take 1 tablet by mouth daily.    . nitroGLYCERIN (NITROSTAT) 0.4 MG SL tablet Place 1 tablet (0.4 mg total) under the tongue every 5 (five) minutes as needed. May repeat x3 25 tablet 6  . omeprazole  (PRILOSEC) 20 MG capsule TAKE ONE CAPSULE BY MOUTH ONCE DAILY 30 MINUTES BEFORE A MEAL 30 capsule 3  . ONE TOUCH LANCETS MISC Check blood sugars two times daily 100 each 6  . potassium chloride (K-DUR,KLOR-CON) 10 MEQ tablet TAKE 1 TABLET DAILY 30 tablet 3  . Probiotic Product (PROBIOTIC DAILY PO) Take 1 tablet by mouth daily.     . simvastatin (ZOCOR) 80 MG tablet Take 80 mg by mouth daily.    . sodium chloride (OCEAN) 0.65 % SOLN nasal spray Place 1 spray into both nostrils 2 (two) times daily as needed for congestion.    . torsemide (DEMADEX) 20 MG tablet Take 20 mg by mouth daily.    Lauris Poag INSULIN SYRINGE 30G X 5/16" 0.5 ML MISC USE 3 TIMES A DAY AS DIRECTED 100 each 6  . vitamin B-12 (CYANOCOBALAMIN) 500 MCG tablet Take 500 mcg by mouth daily.     . vitamin C (ASCORBIC ACID) 500 MG tablet Take 500 mg by mouth daily.     No current facility-administered medications for this visit.    Functional Status:  In your present state of health, do you have any difficulty performing the following activities: 02/02/2015 02/01/2015  Hearing? [name]  Vision? Y Y  Difficulty concentrating or making decisions? [name]  Walking or climbing stairs? Y Y  Dressing or bathing? Y Y  Doing errands, shopping? Y Y  Preparing Food and eating ? - N  Using the Toilet? Y Y  In the past six months, have you accidently leaked urine? Y Y  Do you have problems with loss of bowel control? N N  Managing your Medications? N N  Managing your Finances? N N  Housekeeping or managing your Housekeeping? [name]    Fall/Depression Screening: PHQ 2/9 Scores 02/02/2015 01/25/2015 01/20/2015 02/02/2013  PHQ - 2 Score 2 2 0 1  PHQ- 9 Score 5 5 - -    Assessment: Patient has some knowledge deficit related to diabetes.  Patient will benefit from health coach outreach for disease management and support for self-care.    Plan:  [facility] CM Care Plan Problem One        Most Recent Value   Care Plan Problem One  Knowledge deficit  diabetes   Role Documenting the Problem One  Health Coach   Care Plan for Problem One  Active   THN Long Term Goal (31-90 days)  Patient will report blood sugars less than 300 within the next 90 days   THN Long Term Goal Start Date  02/02/15   Interventions for Problem One Long Term Goal  RN Health Coach discussed with patient maintain a low carbohydrate diet.  Discussed with patient how steroids affect blood sugar.   THN CM Short Term  Goal #1 (0-30 days)  Using teach back method patient wil be able to name at least 2 high carbohydrate foods within the next 30 days.   THN CM Short Term Goal #1 Start Date  02/02/15   Interventions for Short Term Goal #1  RN Health Coach reviewed with patient high carbohydrate foods.     THN CM Short Term Goal #2 (0-30 days)  Patient will be able to verbalize 2 symptoms of hyperglycemia within the next 30 days.   THN CM Short Term Goal #2 Start Date  02/02/15   Interventions for Short Term Goal #2  RN Health Coach reviewed signs and symtpoms of hyperglycemia.      RN Health Coach will provide ongoing education for patient on diabetes through phone calls and sending printed information to patient for further discussion.  RN Health Coach will send welcome packet  to patient as well as printed information on diabetes.  RN Health Coach will send initial barriers letter, assessment, and care plan to primary care physician.  RN Health Coach will contact patient within one month and patient agrees to next contact.  Bary Lericheionne J Naquan Garman, RN, MSN [facility] Care Management RN Telephonic Health Coach [phone]

## 2015-02-10 ENCOUNTER — Ambulatory Visit: Payer: Self-pay

## 2015-02-10 ENCOUNTER — Other Ambulatory Visit: Payer: Self-pay

## 2015-02-10 NOTE — Patient Outreach (Signed)
[facility](THN) Care Management  02/10/2015  [name] 06/25/1940 [id number]   Telephone call to patient.  No answer.  HIPAA compliant voice message left.    Plan: RN Health Coach will contact patient within 1-2 weeks.  Bary Lericheionne J Perris Tripathi, RN, MSN [facility] Care Management RN Telephonic Health Coach [phone]

## 2015-02-16 ENCOUNTER — Other Ambulatory Visit: Payer: Self-pay

## 2015-02-16 ENCOUNTER — Ambulatory Visit: Payer: Self-pay

## 2015-02-16 NOTE — Patient Outreach (Signed)
[facility](THN) Care Management  02/16/2015  Lezli Maryln GottronF [place]cal[place] 06/13/1940 [id number]   2nd telephone call to patient for monthly outreach.  No answer.  Unable to leave a message.   Plan: RN Health Coach will contact patient within 1-2 weeks.  Bary Lericheionne J Stepfanie Yott, RN, MSN [facility] Care Management RN Telephonic Health Coach [phone]

## 2015-02-22 ENCOUNTER — Encounter: Payer: Self-pay | Admitting: Licensed Clinical Social Worker

## 2015-02-22 ENCOUNTER — Other Ambulatory Visit: Payer: Self-pay | Admitting: Licensed Clinical Social Worker

## 2015-02-22 NOTE — Patient Outreach (Signed)
Assessment:  CSW called client home phone number on 02/22/15 and spoke via phone with client on 02/22/15. CSW verified client identity. CSW and client spoke of client needs. Client said that her daughters help transport her to and from client's scheduled medical appointments.  Client said she sees Dr. Olena LeatherwoodHasanaj as her primary care doctor. She said she had appointment with Dr. Olena LeatherwoodHasanaj on 02/11/15.  She said she has her prescribed medications and is taking medications as prescribed. She said she has adequate food supply. Client recently completed [facility] consent [facility] and mailed completed [facility] to [facility] in [place], [place]. Client said she has periods of anxiety. She is taking xanax, as prescribed, to help her manage anxiety and stress. She also uses relaxation techniques to help her manage anxiety/stress. She likes to take a walk, take a nap, watch favorite TV shows or speak with a friend via phone as ways to relax.  Client and CSW spoke of client care plan. CSW  encouraged client to continue using relaxation techniques to help her manage anxiety and stress. Client said she has financial challenges . She said she is receiving bills from hospitals and medical providers.  She said she uses oxygen 24/7 to help her breathe. She said she uses C-Pap to help her breathe at night.  She said she uses a nebulizer to help her with her breathing treatments.  She said she is on [employer] insurance at present.  She said she is [employer] insurance and plans to begin receiving insurance coverage with [facility] on 03/06/15.  She said she  is interested in working with [employer] insurance. regarding her prescribed medications.  CSW thanked client for phone call with CSW on 02/22/15.   Plan: Client to use relaxation techniques to help client manage anxiety and stress symptoms. CSW to call client in three weeks to assess needs of client at that time.  Kelton PillarMichael S.Eloyse Causey MSW, LCSW Licensed Clinical Social Worker [facility]  Care Management [phone]

## 2015-02-24 ENCOUNTER — Ambulatory Visit: Payer: Self-pay

## 2015-02-25 ENCOUNTER — Other Ambulatory Visit: Payer: Self-pay

## 2015-02-25 ENCOUNTER — Ambulatory Visit: Payer: Self-pay

## 2015-02-25 NOTE — Patient Outreach (Signed)
[facility](THN) Care Management  02/25/2015  [name] 11/16/1940 [id number]   3rd Telephone call to patient for outreach.  No answer.    Plan: RN Health Coach will send letter for attempted outreach. RN Health Coach will plan to close if no response after 10 business days of letter being sent.    Margaret Lericheionne J Dhruva Orndoff, RN, MSN [facility] Care Management RN Telephonic Health Coach [phone]

## 2015-03-08 ENCOUNTER — Other Ambulatory Visit: Payer: Self-pay

## 2015-03-08 NOTE — Patient Outreach (Addendum)
I attempted to call Ms. Propst to verify her medications so I could complete a medication review.  I was not able to get an answer or leave a message.  The phone just continued to ring.  I will try again in a couple of days.  This was my first attempt in trying to reach her.    Steve Rattlerawn Korayma Hagwood, PharmD, [employer] [facility] Pharmacy Manager [phone]

## 2015-03-14 ENCOUNTER — Other Ambulatory Visit: Payer: Self-pay

## 2015-03-14 NOTE — Patient Outreach (Signed)
[facility](THN) Care Management  03/14/2015  [name] 01/06/1941 [id number]   Nursing case closure of case as patient has not responded after calls and outreach letter.    Plan: RN Health Coach will send in basket message to pharmacy and social work notifying of closure. RN Health Coach will send discipline closure letter to physician.    [name], RN, MSN [facility] Care Management RN Telephonic Health Coach [phone]

## 2015-03-15 ENCOUNTER — Other Ambulatory Visit: Payer: Self-pay

## 2015-03-15 ENCOUNTER — Encounter: Payer: Self-pay | Admitting: Licensed Clinical Social Worker

## 2015-03-15 ENCOUNTER — Other Ambulatory Visit: Payer: Self-pay | Admitting: Licensed Clinical Social Worker

## 2015-03-15 NOTE — Patient Outreach (Signed)
Assessment:  CSW called home phone number of client on 03/15/15. CSW spoke via phone with client on 03/15/15.  CSW verified client identity.  CSW and client spoke of client care plan.  CSW encouraged client to continue to use relaxation techniques to help client manage stress/anxiety symptoms.  CSW also encouraged client to attend scheduled client medical appointments in next 30 days. Client said her daughters help transport her to and from her medical appointments. She said she has adequate food supply. She said she recently switched from [employer] to [employer].  She said she has prescribed medications and is taking medications as prescribed. She is concerned that her medication costs under her new insurance may be higher than she previously paid for these prescribed medications.  She said she has ongoing back pain issues.  She said she is ambulating well in her home and does not have to use a cane or walker to ambulate.  She said she had an appointment with Dr.Hasanaj on 02/11/15.  She said she thinks she has an appointment in coming weeks with a pulmonologist.  Client said she would like to speak with [facility] pharmac[facility] to discuss medication costs and medication questions.  [facility] has already received referral on client and is trying to contact client.  CSW gave client the [facility] number for Dr. Steve Rattlerawn Pettus, pharmacist, and encouraged client to speak via phone with Dr. Linward FosterPettus about client medication questions. Dr. Linward FosterPettus has previously called client home number and has been trying to speak via phone with Cherrill F. Frieze.  CSW thanked client for phone call with CSW on 03/15/15.  Plan: Client to use relaxation techniques to help client manage stress/anxiety symptoms. Client to attend all scheduled client medical appointments. CSW to communicate with Dr. Linward FosterPettus regarding medication issues for client. CSW to call client in 3 weeks to assess client needs.  Kelton PillarMichael S.Chaunte Hornbeck MSW,  LCSW Licensed Clinical Social Worker [facility] Care Management [phone]

## 2015-03-15 NOTE — Patient Outreach (Addendum)
I attempted to call Ms. Collard to verify her medications so I could complete a medication review and was not able to get an answer or leave a message.  The phone just continued to ring.  I will try again in a couple of days.  This was my second attempt in trying to reach her.   Steve Rattlerawn Auriana Scalia, PharmD, [employer] [facility] Pharmacy Manager [phone]

## 2015-03-17 ENCOUNTER — Other Ambulatory Visit: Payer: Self-pay

## 2015-03-17 NOTE — Patient Outreach (Signed)
I called [name] to set up an appointment to review her medications.  I will make a home visit on 03/24/15 at 10 am.    Steve Rattlerawn Yenni Carra, PharmD, [facility] Pharmacy Manager [phone]

## 2015-03-18 ENCOUNTER — Telehealth: Payer: Self-pay | Admitting: Pulmonary Disease

## 2015-03-18 MED ORDER — BUDESONIDE 0.25 MG/2ML IN SUSP: 0.2500 mg | Freq: Two times a day (BID) | RESPIRATORY_TRACT | Status: DC

## 2015-03-18 MED ORDER — ALBUTEROL SULFATE (2.5 MG/3ML) 0.083% IN NEBU: 2.5000 mg | INHALATION_SOLUTION | Freq: Four times a day (QID) | RESPIRATORY_TRACT | Status: AC

## 2015-03-18 MED ORDER — IPRATROPIUM BROMIDE 0.02 % IN SOLN: 500.0000 ug | Freq: Four times a day (QID) | RESPIRATORY_TRACT | Status: DC

## 2015-03-18 NOTE — Telephone Encounter (Signed)
Patient called again and is anxious to speak to someone as soon as possible as she needs med today.  CB [phone].

## 2015-03-18 NOTE — Telephone Encounter (Signed)
Spoke with pt, states she's changed insurances, states she needs her rx's changed to 90 day rx for ipratropium bromide, budesonide, and albuterol neb.   [facility].  This has been sent.  Nothing further needed.

## 2015-03-24 ENCOUNTER — Other Ambulatory Visit: Payer: Self-pay

## 2015-03-27 NOTE — Patient Outreach (Signed)
[facility]) Care Management  [facility] CM Pharmacy   03/27/2015 This is a late entry for a visit on 03/24/15 [name] 1941/01/14 [id number]  Subjective: [name] is a 75 year old female who was referred for a medication review. I tried to complete the medication review over the phone and she was unable to answer my questions so I decided to make a home visit.  She has all of her medications in her home and medication list in EPIC did not match.  I was able to clean up her EPIC medication list to match what she was taking.  She was able to tell me exactly how she takes all her medications correctly and understood what each was for.  She stated she was had one day of albuterol nebulizing solution left and she was waiting for [facility] to deliver it to her.  I called [facility] and determined that it was not going to be delivered until she paid $287 for albuterol, ipratropium and budesonide.  She stated she just switched to [facility] and she had never had to pay this amount previously when she got these medications from [place].  I asked [facility] is they would send just the albuterol since it was just $8 and they said they could not.  I asked them to reverse the fill so that she could get the albuterol filled locally.  They stated they would.  I had Dr. Olena Leatherwood send in a prescription for albuterol nebulizing solution to [facility].    Objective:   Current Medications: Current Outpatient Prescriptions  Medication Sig Dispense Refill  . albuterol (PROAIR HFA) 108 (90 BASE) MCG/ACT inhaler Inhale 1-2 puffs into the lungs every 6 (six) hours as needed for wheezing or shortness of breath. 18 g 2  . albuterol (PROVENTIL) (2.5 MG/3ML) 0.083% nebulizer solution Take 3 mLs (2.5 mg total) by nebulization 4 (four) times daily. 1080 mL 3  . ALPRAZolam (XANAX) 0.5 MG tablet Take 1 tablet (0.5 mg total) by mouth 3 (three) times daily. 90 tablet 0  . aspirin 81 MG  tablet Take 81 mg by mouth daily.    . bisoprolol (ZEBETA) 5 MG tablet Take 0.5 tablets (2.5 mg total) by mouth daily. 45 tablet 0  . budesonide (PULMICORT) 0.25 MG/2ML nebulizer solution Take 2 mLs (0.25 mg total) by nebulization 2 (two) times daily. 180 mL 3  . calcium-vitamin D (OSCAL WITH D) 500-200 MG-UNIT per tablet Take 1 tablet by mouth 2 (two) times daily.      . citalopram (CELEXA) 10 MG tablet Take 10 mg by mouth daily.    Marland Kitchen dextromethorphan-guaiFENesin (MUCINEX DM) 30-600 MG per 12 hr tablet Take 2 tablets by mouth every 12 (twelve) hours.      . ferrous sulfate 325 (65 FE) MG tablet Take 325 mg by mouth daily with breakfast.     . fluticasone (FLONASE) 50 MCG/ACT nasal spray Place 1 spray into both nostrils 2 times daily at 12 noon and 4 pm.    . glucose blood (ONE TOUCH ULTRA TEST) test strip TEST BLOOD SUGAR LEVELS THREE TIMES DAILY 100 each 5  . HYDROcodone-acetaminophen (NORCO/VICODIN) 5-325 MG per tablet Take 1 tablet by mouth 2 (two) times daily as needed for moderate pain.    Marland Kitchen ibandronate (BONIVA) 150 MG tablet TAKE 1 TABLET EVERY MONTH ON THE SAME DAY 1 tablet 1  . insulin aspart protamine - aspart (NOVOLOG MIX 70/30 FLEXPEN) (70-30) 100 UNIT/ML  FlexPen Inject 0.25 mLs (25 Units total) into the skin 2 (two) times daily with a meal. E11.8 15 mL 11  . Insulin Lispro, Human, (HUMALOG Joanna) Inject into the skin.    Marland Kitchen ipratropium (ATROVENT) 0.02 % nebulizer solution Take 2.5 mLs (500 mcg total) by nebulization 4 (four) times daily. 1080 mL 3  . isosorbide mononitrate (IMDUR) 30 MG 24 hr tablet TAKE 1 TABLET DAILY 90 tablet 1  . losartan (COZAAR) 50 MG tablet TAKE ONE TABLET BY MOUTH ONCE DAILY 30 tablet 3  . Menthol, Topical Analgesic, (BENGAY PAIN RELIEF + MASSAGE) 2.5 % GEL Apply topically. Apply a small amount to affected area as needed    . metFORMIN (GLUCOPHAGE) 1000 MG tablet Take 1,000 mg by mouth 2 (two) times daily with a meal.    . Multiple Vitamin (MULTIVITAMIN) tablet  Take 1 tablet by mouth daily.    . nitroGLYCERIN (NITROSTAT) 0.4 MG SL tablet Place 1 tablet (0.4 mg total) under the tongue every 5 (five) minutes as needed. May repeat x3 25 tablet 6  . omeprazole (PRILOSEC) 20 MG capsule TAKE ONE CAPSULE BY MOUTH ONCE DAILY 30 MINUTES BEFORE A MEAL 30 capsule 3  . ONE TOUCH LANCETS MISC Check blood sugars two times daily 100 each 6  . potassium chloride (K-DUR,KLOR-CON) 10 MEQ tablet TAKE 1 TABLET DAILY 30 tablet 3  . Probiotic Product (PROBIOTIC DAILY PO) Take 1 tablet by mouth daily.     . sennosides-docusate sodium (SENOKOT-S) 8.6-50 MG tablet Take 1 tablet by mouth daily as needed for constipation.    . simvastatin (ZOCOR) 80 MG tablet Take 80 mg by mouth daily.    . sodium chloride (OCEAN) 0.65 % SOLN nasal spray Place 1 spray into both nostrils 2 (two) times daily as needed for congestion.    . torsemide (DEMADEX) 20 MG tablet Take 20 mg by mouth daily.    Lauris Poag INSULIN SYRINGE 30G X 5/16" 0.5 ML MISC USE 3 TIMES A DAY AS DIRECTED 100 each 6  . vitamin B-12 (CYANOCOBALAMIN) 1000 MCG tablet Take 1,000 mcg by mouth daily.    . vitamin C (ASCORBIC ACID) 500 MG tablet Take 500 mg by mouth daily.     No current facility-administered medications for this visit.    Functional Status: In your present state of health, do you have any difficulty performing the following activities: 03/15/2015 02/02/2015  Hearing? [name]  Vision? Y Y  Difficulty concentrating or making decisions? [name]  Walking or climbing stairs? Y Y  Dressing or bathing? Y Y  Doing errands, shopping? [name]  Preparing Food and eating ? N -  Using the Toilet? Y Y  In the past six months, have you accidently leaked urine? Y Y  Do you have problems with loss of bowel control? N N  Managing your Medications? N N  Managing your Finances? N N  Housekeeping or managing your Housekeeping? [name]    Fall/Depression Screening: [facility] 2/9 Scores 03/15/2015 02/02/2015 01/25/2015 01/20/2015 02/02/2013   PHQ - 2 Score 0 1  PHQ- 9 Score - -    Assessment:  Drugs sorted by system:  Neurologic/Psychologic: alprazolam, citalopram  Cardiovascular: aspirin, bisoprolol, isosorbide mononitrate, losartan, nitrostat, simvastatin, torsemide  Pulmonary/Allergy: albuterol, ipratropium, budesonide, mucinex dm, fluticasone  Gastrointestinal: omeprazole, senna-s  Endocrine: ibandronate, Novolog 70/30, Novolog, metformin  Renal:  Topical: Bengay menthol   Pain: hydrocodone-acetaminophen  Vitamins/Minerals: vitamin C, vitamin B12, Calcium plus vitamin D,  ferrous sulfate, multivitamin, potassium,   Infectious Diseases: none   Miscellaneous: probiotic, ocean nasal saline   Duplications in therapy: none Gaps in therapy: none Medications to avoid in the elderly: Omeprazole (increase risk of fracture and C. Diff) Drug interactions: none Other issues noted: about to run out of albuterol   Albuterol:  She is about to about to run out of albuterol due to [facility] not delivering it.  She will have to get it from a local pharmacy.   Plan: 1.  No major drug interactions or problems noted.  She is on Omeprazole which places her at high risk for fractures and C. Diff.  She is on ibandronate for osteoporosis.  She does remember being on zantac previously and it not working as well as omeprazole.   2.  I called Dr. Bartholomew Crews office and asked for albuterol nebulizing solution to [facility].  I will follow up to make sure that it was called in.  3.  I will follow up with [employer] to see how much it will cost for Ms. Sprenkle to get the nebulizing solutions from them. 4. I will follow up with Ms. Rattigan next week with my findings.   Steve Rattler, PharmD, [employer] [phone]

## 2015-03-29 ENCOUNTER — Other Ambulatory Visit: Payer: Self-pay

## 2015-03-29 NOTE — Patient Outreach (Signed)
I called Ms. Wood's daughter, Rowland Lathe, to let her know that I called [employer] and determined that Budesonide would cost $59.44 per month and Ipratropium would cost $5.70 per month.  She stated she would let her mom know.  I will close her case to pharmacy.  I have dealt with all the pharmacy needs.  I am happy to assist with further pharmacy needs as they arise.   Steve Rattler, PharmD, [employer] [phone]

## 2015-04-05 ENCOUNTER — Other Ambulatory Visit: Payer: Self-pay | Admitting: Licensed Clinical Social Worker

## 2015-04-05 NOTE — Patient Outreach (Signed)
Assessment:  CSW called client home phone number on 04/05/15 and spoke via phone with client. CSW verified client identity. CSW and Serrita spoke client needs.  Client and CSW spoke of medication issues for client.  She said she has her prescribed medications.  She said she receives oxygen from [place]. She is obtaining some of her prescribed medications from [facility].  She has [facility].  She said her daughter transports her to and from her scheduled medical appointments.  She said she has an appointment with Dr. Olena Leatherwood in February of 2017.  She said she is eating well.  She and CSW spoke of client care plan.  CSW encouraged client to attend all scheduled client medical appointments in next 30 days.  She said she will try to attend her scheduled medical appointments in next 30 days. She said she is able to pay her monthly bills at present.  She has support from her daughter.  Her daughter helps her with food procurement. Her daughter helps client with home cleaning and maintenance.  Her daughter helps client with client transport needs.  Client said she is walking well in the home.  She continues to watch TV to relax or  calls family or friends via phone to talk to help her relax.  CSW encouraged client to call CSW at [phone] to discuss social work needs of client. CSW thanked client for phone call with CSW on 04/05/15.   Plan: Client to attend all scheduled client medical appointments in next 30 days. CSW to call client in 4 weeks to assess client needs.  [name] MSW, LCSW Licensed Clinical Social Worker [facility] Care Management [phone]

## 2015-04-19 ENCOUNTER — Other Ambulatory Visit: Payer: Self-pay | Admitting: Family Medicine

## 2015-04-28 ENCOUNTER — Ambulatory Visit: Payer: PPO | Admitting: Pulmonary Disease

## 2015-05-02 ENCOUNTER — Other Ambulatory Visit: Payer: Self-pay | Admitting: Emergency Medicine

## 2015-05-02 ENCOUNTER — Other Ambulatory Visit: Payer: Self-pay | Admitting: Licensed Clinical Social Worker

## 2015-05-02 MED ORDER — IPRATROPIUM BROMIDE 0.02 % IN SOLN: 500.0000 ug | Freq: Four times a day (QID) | RESPIRATORY_TRACT | Status: AC

## 2015-05-02 MED ORDER — BUDESONIDE 0.25 MG/2ML IN SUSP: 0.2500 mg | Freq: Two times a day (BID) | RESPIRATORY_TRACT | Status: AC

## 2015-05-02 NOTE — Patient Outreach (Signed)
Assessment:  CSW called client home phone number on 05/02/15 and spoke via phone with client. CSW verified client identity. Client and CSW spoke of client needs. [facility] provides oxygen for client.  Client uses nebulizer as prescribed daily.  Client uses [employer]  for her prescptions.   She sees Dr. Olena Leatherwood as her primary doctor. She sees a lung specialist in [place], [place]; and, she also sees a heart specialist in [place], [place].  Paying for the copay amounts at specialist visits is expensive for client.  She has adequate food supply. She is sleeping well.  She has a daughter who visits and calls client often.  She has transport assistance from her daughters to help her go to and from her scheduled medical appointments.  She is trying to get her prescribed medications through [facility].  She does well with her walking.  She gets short of breath walking a short distance.  She uses her oxygen as prescribed 24/7.  She has an appointment in a few months with Dr. Olena Leatherwood.  She did not report any pain issues. She does become fatigued often.  She gets up several times per night to go to the bathroom due to fluid buildup.  She takes lasix (generic as prescribed).  She has taken diurectic for several years. She said her primary care doctor is trying to prevent swelling in client's legs and to prevent fluid buildup around her heart.  She prepares her own meals and uses fast prepared meals.  She said she feels that her weight is stable.  She has family support. CSW and client spoke of client care plan. CSW encouraged client to attend all scheduled client medical appointments in next 30 days. Client said she would try to attend all her scheduled medical appointments in next 30 days. CSW thanked client for phone call on 05/02/15. CSW encouraged client to call CSW at [phone] as needed to discuss social work needs of client.   Plan: Client to attend all scheduled client medical appointments in  next 30 days. CSW to call client in 4 weeks to assess client needs at that time.   Kelton Pillar.Alissia Lory MSW, LCSW Licensed Clinical Social Worker [facility] Care Management [phone]

## 2015-05-09 ENCOUNTER — Telehealth: Payer: Self-pay | Admitting: Pulmonary Disease

## 2015-05-09 MED ORDER — AMOXICILLIN-POT CLAVULANATE 875-125 MG PO TABS: 1.0000 | ORAL_TABLET | Freq: Two times a day (BID) | ORAL | Status: DC

## 2015-05-09 NOTE — Telephone Encounter (Signed)
Per SN: Augmentin 875, take 1 po BID, #14 Take OTC Align once daily

## 2015-05-09 NOTE — Telephone Encounter (Signed)
Pt daughter calling to f/u on this message [phone]

## 2015-05-09 NOTE — Telephone Encounter (Signed)
Patient says that she has a sinus infection that is draining down her throat causing her to cough a lot.  She has pain in head, down back of neck.  She is afraid of getting pneumonia or bronchitis.  She says she has an appointment with Dr. Kriste BasqueNadel this week and she would like something called into her pharmacy to last her until her appointment.  [facility]  Allergies  Allergen Reactions  . Codeine Nausea And Vomiting    vomiting  . Terbutaline Sulfate     REACTION: INTOL to terbutaline   Current Outpatient Prescriptions on File Prior to Visit  Medication Sig Dispense Refill  . albuterol (PROAIR HFA) 108 (90 BASE) MCG/ACT inhaler Inhale 1-2 puffs into the lungs every 6 (six) hours as needed for wheezing or shortness of breath. 18 g 2  . albuterol (PROVENTIL) (2.5 MG/3ML) 0.083% nebulizer solution Take 3 mLs (2.5 mg total) by nebulization 4 (four) times daily. 1080 mL 3  . ALPRAZolam (XANAX) 0.5 MG tablet Take 1 tablet (0.5 mg total) by mouth 3 (three) times daily. 90 tablet 0  . aspirin 81 MG tablet Take 81 mg by mouth daily.    . bisoprolol (ZEBETA) 5 MG tablet Take 0.5 tablets (2.5 mg total) by mouth daily. 45 tablet 0  . budesonide (PULMICORT) 0.25 MG/2ML nebulizer solution Take 2 mLs (0.25 mg total) by nebulization 2 (two) times daily. 180 mL 11  . calcium-vitamin D (OSCAL WITH D) 500-200 MG-UNIT per tablet Take 1 tablet by mouth 2 (two) times daily.      . citalopram (CELEXA) 10 MG tablet Take 10 mg by mouth daily.    Marland Kitchen. dextromethorphan-guaiFENesin (MUCINEX DM) 30-600 MG per 12 hr tablet Take 2 tablets by mouth every 12 (twelve) hours.      . ferrous sulfate 325 (65 FE) MG tablet Take 325 mg by mouth daily with breakfast.     . fluticasone (FLONASE) 50 MCG/ACT nasal spray Place 1 spray into both nostrils 2 times daily at 12 noon and 4 pm.    . glucose blood (ONE TOUCH ULTRA TEST) test strip TEST BLOOD SUGAR LEVELS THREE TIMES DAILY 100 each 5  . HYDROcodone-acetaminophen  (NORCO/VICODIN) 5-325 MG per tablet Take 1 tablet by mouth 2 (two) times daily as needed for moderate pain.    Marland Kitchen. ibandronate (BONIVA) 150 MG tablet TAKE 1 TABLET EVERY MONTH ON THE SAME DAY 1 tablet 1  . insulin aspart protamine - aspart (NOVOLOG MIX 70/30 FLEXPEN) (70-30) 100 UNIT/ML FlexPen Inject 0.25 mLs (25 Units total) into the skin 2 (two) times daily with a meal. E11.8 15 mL 11  . Insulin Lispro, Human, (HUMALOG Lupton) Inject into the skin.    Marland Kitchen. ipratropium (ATROVENT) 0.02 % nebulizer solution Take 2.5 mLs (500 mcg total) by nebulization 4 (four) times daily. 1080 mL 11  . isosorbide mononitrate (IMDUR) 30 MG 24 hr tablet TAKE 1 TABLET DAILY 90 tablet 1  . losartan (COZAAR) 50 MG tablet TAKE ONE TABLET BY MOUTH ONCE DAILY 30 tablet 3  . Menthol, Topical Analgesic, (BENGAY PAIN RELIEF + MASSAGE) 2.5 % GEL Apply topically. Apply a small amount to affected area as needed    . metFORMIN (GLUCOPHAGE) 1000 MG tablet Take 1,000 mg by mouth 2 (two) times daily with a meal.    . Multiple Vitamin (MULTIVITAMIN) tablet Take 1 tablet by mouth daily.    . nitroGLYCERIN (NITROSTAT) 0.4 MG SL tablet Place 1 tablet (0.4 mg total) under the  tongue every 5 (five) minutes as needed. May repeat x3 25 tablet 6  . omeprazole (PRILOSEC) 20 MG capsule TAKE ONE CAPSULE BY MOUTH ONCE DAILY 30 MINUTES BEFORE A MEAL 30 capsule 3  . ONE TOUCH LANCETS MISC Check blood sugars two times daily 100 each 6  . potassium chloride (K-DUR,KLOR-CON) 10 MEQ tablet TAKE 1 TABLET DAILY 30 tablet 3  . Probiotic Product (PROBIOTIC DAILY PO) Take 1 tablet by mouth daily.     . sennosides-docusate sodium (SENOKOT-S) 8.6-50 MG tablet Take 1 tablet by mouth daily as needed for constipation.    . simvastatin (ZOCOR) 80 MG tablet Take 80 mg by mouth daily.    . sodium chloride (OCEAN) 0.65 % SOLN nasal spray Place 1 spray into both nostrils 2 (two) times daily as needed for congestion.    . torsemide (DEMADEX) 20 MG tablet Take 20 mg by mouth  daily.    Lauris Poag INSULIN SYRINGE 30G X 5/16" 0.5 ML MISC USE 3 TIMES A DAY AS DIRECTED 100 each 6  . vitamin B-12 (CYANOCOBALAMIN) 1000 MCG tablet Take 1,000 mcg by mouth daily.    . vitamin C (ASCORBIC ACID) 500 MG tablet Take 500 mg by mouth daily.     No current facility-administered medications on file prior to visit.

## 2015-05-09 NOTE — Telephone Encounter (Signed)
Patient's daughter notified of Dr. Jodelle GreenNadel's recommendations. Rx sent to pharmacy. Nothing further needed.

## 2015-05-12 ENCOUNTER — Telehealth: Payer: Self-pay | Admitting: Pulmonary Disease

## 2015-05-12 ENCOUNTER — Encounter: Payer: Self-pay | Admitting: Pulmonary Disease

## 2015-05-12 ENCOUNTER — Ambulatory Visit (INDEPENDENT_AMBULATORY_CARE_PROVIDER_SITE_OTHER): Payer: Medicare HMO | Admitting: Pulmonary Disease

## 2015-05-12 VITALS — BP 118/66 | HR 72 | Temp 98.5°F | Ht 64.0 in | Wt 209.6 lb

## 2015-05-12 DIAGNOSIS — E118 Type 2 diabetes mellitus with unspecified complications: Secondary | ICD-10-CM

## 2015-05-12 DIAGNOSIS — G4733 Obstructive sleep apnea (adult) (pediatric): Secondary | ICD-10-CM | POA: Diagnosis not present

## 2015-05-12 DIAGNOSIS — R197 Diarrhea, unspecified: Secondary | ICD-10-CM

## 2015-05-12 DIAGNOSIS — J9611 Chronic respiratory failure with hypoxia: Secondary | ICD-10-CM

## 2015-05-12 DIAGNOSIS — E669 Obesity, unspecified: Secondary | ICD-10-CM

## 2015-05-12 DIAGNOSIS — Z9861 Coronary angioplasty status: Secondary | ICD-10-CM

## 2015-05-12 DIAGNOSIS — J4489 Other specified chronic obstructive pulmonary disease: Secondary | ICD-10-CM

## 2015-05-12 DIAGNOSIS — I2510 Atherosclerotic heart disease of native coronary artery without angina pectoris: Secondary | ICD-10-CM

## 2015-05-12 DIAGNOSIS — M159 Polyosteoarthritis, unspecified: Secondary | ICD-10-CM

## 2015-05-12 DIAGNOSIS — I214 Non-ST elevation (NSTEMI) myocardial infarction: Secondary | ICD-10-CM

## 2015-05-12 DIAGNOSIS — J449 Chronic obstructive pulmonary disease, unspecified: Secondary | ICD-10-CM | POA: Diagnosis not present

## 2015-05-12 DIAGNOSIS — M150 Primary generalized (osteo)arthritis: Secondary | ICD-10-CM

## 2015-05-12 DIAGNOSIS — I5181 Takotsubo syndrome: Secondary | ICD-10-CM

## 2015-05-12 DIAGNOSIS — I10 Essential (primary) hypertension: Secondary | ICD-10-CM

## 2015-05-12 MED ORDER — FIRST-DUKES MOUTHWASH MT SUSP: OROMUCOSAL | Status: DC

## 2015-05-12 MED ORDER — METRONIDAZOLE 500 MG PO TABS: 500.0000 mg | ORAL_TABLET | Freq: Three times a day (TID) | ORAL | Status: DC

## 2015-05-12 NOTE — Telephone Encounter (Signed)
Pt seen today by SN: Patient Instructions       Today we updated your med list in our EPIC system...    Continue your breathing meds the same...      STOP the Augmentin antibiotic...  START the new FLAGYL (Metronidazole) 500mg  tabs- one tab three times daily...  Use the PROBIOTIC (eg- ALIGN) daily & add-in ACTIVIA Yogurt daily...  Use may also use OTC IMMODIUM - one tab every 4-6H as needed for watery diarrhea...  Be sure to drink plenty of fluids...   We will call-in DUKE's MOUTHWASH to help w/ your throat symptoms...    One tsp- gargle & swallow up to 4 times daily as needed...  Continue the MUCINEX 2tabs twice daily...   [facility] Bjorn LoserRhonda to give rx's verbally Pt's daughter is aware Will sign off

## 2015-05-12 NOTE — Progress Notes (Signed)
Subjective:    Patient ID: [name], female    DOB: Jul 31, 1940, 75 y.o.   MRN: [id number]  HPI 75 y/o WF here for a follow up visit... Lynniah has 13 grandchildren & 18 great-grands!  She also has mult med problems including severe COPD; HBP; CAD w/prev MI & PTCA followed by DrMcDowell; VI; Hyperlipidemia; DM on insulin; Obesity; GERD/ constipation; DJD/ osteoporosis; Anxiety...   ~  SEE PREV EPIC NOTES FOR OLDER DATA >>    LABS 5/14:  FLP- not at goals w/ TG=227, HDL=33;  Chems- ok x BS=160, A1c=7.3;  CBC- wnl;  TSH=2.09...  LABS 12/14:  chems- ok x BS=203, A1c=7.6 on her Metform500Bid & 70/30 insulin 30uBid=> Rec to incr to 35uBid + diet & exercise...  CXR 3/15 showed cardiomeg, COPD/ atelectasis in lingula & LLL, DJD in Tspine & shoulders, NAD...  PFT 6/15 showed FVC=0.81 (28%), FEV1=0.43 (20%), %1sec=53%, mid-flows= 9% predicted... c/w GOLD Stage4 COPD.  Dec2015> she continues to REFUSE Flu shots and Pneumonia vaccine...  12/15: Oxygen saturation on 2L/min at rest= 95% w/ pulse=64/min;  After walking 3 laps her O2sat=93% w/ pulse=84/min... Rec to continue same.    ~  August 04, 2014:  5mo ROV & Nyana was [facility] 4/20 - 06/29/14 by [facility] "I just got sick- a little bit of everything"> DCSummary says COPD exac w/ acute on chr resp failure, OSA on CPAP, NSTEMI, cardiomyopathy, DM, HL, RI, etc... 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  We reviewed the following medical problems during today's office visit >>     OSA> on CPAP but she wasn't using this prior to adm, she will use it regularly & bring machine in for a download at next visit, I  cannot find initial sleep study or Sleep Med notes in epic...    HxSinusitis> on MMW, Flonase, OTC antihist prn; c/o chr congestion, & prev infection treated by DrMcGowen w/ Levaquin...    COPD (GOLD Stage4)> on O2 (Helios system- 2L/min continuously), NEBS w/ DuonebQid/ BudesBid, Mucinex2Bid; +recent Hosp w/ acute on chr resp failure as noted & improved on Levaquin & Pred taper (off now); chr dyspnea- ok w/ ADLs in general but SOB w/ chores/ housework/ walking in the house, etc; pCO2 in the 50 range but up to 80 prior to intubation 4/16; she has gained wt, not dieting & not exercising- we read her the riot act!    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    BP is controlled on above adjusted meds & measures 104/58 today... She denies CP, palpit, ch in SOB, etc on her ASA81 & Imdur30...     Chol is regulated w/ Atorva40 now- last FLP 7/15 showed elev TG & she is reminded to follow a strict low fat diet & work on weight reduction, & f/u w/ PCP & Cards.Marland KitchenMarland Kitchen  DM control is poor on her 70/30 insulin taking 25u Bid per Primary Care; Labs 4/16 showed BS=72-338, A1c= 10.2; she will f/u w/ her PCP soon...    Obesity> she diureses to 98 Kg (216#) during her Hosp; now weighs 219.6 (BMI=37) and she desperately needs to lose the weight...    She remains on Prilosec20, [id number], Alprazolam; but off prev Neurontin, Lodine/Tramadol, Vicodin... We reviewed prob list, meds, xrays and labs> [facility] -  reviewed...  Uses [facility] for neb meds and supplies, ?CPAP as well?  CXR 5/16 showed cardiomeg, bibasilar atx vs infiltrate-- treated w/ Levaquin PLAN>>  Continue current regimen, bring  all med bottles to every visit, needs to use CPAP nightly & bring machine for check & download if poss...   ~  August 31, 2014:  4wk ROV & [name] did not bring med list or bottles to today's visit; furthermore she tells me that she refused the CPAP- doesn't like the mask/ machine/ etc and refuses to work w/ Sleep Med to help tolerabliity/ interface problems, etc... She doesn't know her meds and is again asked to keep accurate med list handy w/ any changes from her diff providers...    OSA> she refuses CPAP or Sleep Med consult to help w/ tolerability...    HxSinusitis> on MMW, Flonase, OTC antihist prn; c/o chr congestion, & prev infection treated by DrMcGowen w/ Levaquin...    COPD (GOLD Stage4)> on O2 (Helios system- 2L/min continuously), NEBS w/ DuonebQid/ BudesBid, Mucinex2Bid; +recent Hosp w/ acute on chr resp failure as noted & improved on Levaquin & Pred taper (off now); chr dyspnea- ok w/ ADLs in general but SOB w/ chores/ housework/ walking in the house, etc; pCO2 in the 50 range but up to 80 prior to intubation 4/16; she has gained wt up to 220#, not dieting & not exercising- we begged her to get on diet, increase exercise, and get wt down...    She is overdue for CARDS f/u & she will call DrMcDowell's office to set this up; her PCP changed Demadex to Furosemide 40mg /d... We reviewed prob list, meds, xrays and labs>  PLAN>>  Continue current regimen & call for questions; reminded to bring all med bottles to each & every doctor visit; needs to incr her exercise program & get wt down! she will call for CARDS f/u appt & see me back in 6-8wks...  ~  October 26, 2014:  39mo ROV & [name] indicates "I'm-a-doin" but notes that she was [facility] at [facility] 7/31 - 10/06/14 with a "mini-stroke" she says; they had stopped her ASA prior to the event & it was restarted after the episode;  She notes that her breathing is OK & she says they told her that her CXR was fine- on NEBS w/ Brovana & Pulmicort BID, plus  Albuterol&Atrovent nebs & Mucinex prn... She remains on Demadex20Bid & Diamox250Bid (see BMet below)... She is followed by DrMcDowell in the [place] [facility] office- his note from 09/22/14 is reviewed...  EXAM shows Afeb, VSS, O2sat=95% on 2-3L by McKean;  HEENT- neg, mallampati4;  Chest- decr BS at bases, few scat rhonchi, w/o w/r/consolidation;  Abd- obese, soft, neg;  Ext- VI, tr edema, no c/c;  Neuro- no focal neuro deficits...  LABS 10/2014:  Chems-  HCO3=33, K=3.8,  BUN=23, Cr=1.28 -- stable, continue same meds...  IMP/PLAN>>  She is stable from the pulmonary standpoint, continue same meds, encouraged incr activity/ exercise; ROV in  79mo sooner if needed...  ADDENDUM>>  10/30/14 CT Chest, Abd, Pelvis  after a fall showed mild cardiomeg, aortic & coronary calcif, bibasilar atx vs fibrosis. NAD; s/p GB, calcif Ao, bilat renal stones, subxiphoid abd wall hernia containing fat, s/p Hyst, norm appendix, DJD spine w/ ant compression of T2 & L4...   ~  January 26, 2015:  198mo ROV & Sybol is about the same but c/o incr congestion despite the Mucinex 2Bid & fluids, it is worst in the AM & she is reminded to keep a NEB rx handy to use when she wakes up & continue all meds regularly etc;  We decided to treat w/ Depo80, Medrol dosepak, & refill her Proair for prn use when out & about... Epic records indicate aot of THN activity;  Her PCP is DrHasanaj    OSA> she refuses CPAP or Sleep Med consult to help w/ interface tolerability; states she is sleeping OK...    HxSinusitis> on MMW, Flonase, OTC antihist prn; c/o chr congestion, & prev infection treated w/ Levaquin...    COPD (GOLD Stage4- FEV1=20%> on O2 (Helios system- 2L/min continuously), NEBS w/ DuonebQid/ BudesBid, Mucinex2Bid; prev Hosp (4/16) w/ acute on chr resp failure as noted & improved on Levaquin & Pred taper (off now); chr dyspnea- ok w/ ADLs in general but SOB w/ chores/ housework/ walking in the house, etc; pCO2 in the 50 range but up to 80 prior to  intubation 4/16; wt is down to 208#, but not dieting & not exercising- we begged her to get on diet, increase exercise, and continue to work on weight reduction...    She saw DrMcDowell for Cards 10/27> CAD w/ prev LAD intervention, hx takotsubo cardiomyopathy w/ prev EF=30-35%; she denied CP, SOB w/o change, feeling well; repeat contrast enhanced 2DEcho 11/4 showed improved LVF w/ EF=50-55% & normal wall motion... EXAM shows Afeb, VSS, O2sat=98% on 3L;  HEENT- neg, mallampati4;  Chest- decr BS bilat, end exp wheeze w/o consolidation;  Heart- RR gr1/6 SEM w/o r/g;  Abd- obese, soft, neg;  Ext- VI w/o c/c/e...  2DEcho 01/03/15 showed mild LVH, reduced LVF w/ EF=40-45%, Gr1DD, there was a regional wall motion abn w/ mod HK in apex & mild HK in other areas, septal dysynergy...  2DEcho repeated w/ contrast 01/07/15 by DrMcDowell> LV sys function was normal w/ EF=50-55%, normal wall motion... IMP/PLAN>>  Amoura has some increased congestion & we discussed Rx w/ Depo80 + Medrol dosepak;  LVF is improved, breathing is stable, medical management from [facility] (her PCP)... We will continue w/ 198mo ROVs.  ~  May 12, 2015:  3-22mo ROV & Yazmen has been well managed since her last OV w/ 16 entries in EPIC by [facility]- pt outreach; she reports that she did satis over the holidays, but notes onset of URI/sinus ~2wks ago w/ eval by her PCP [facility]- Rx w/ antibiotic, then Augmentin, but now c/o diarrhea despite taking Align & Activia yogurt (we discussed stopping the Augmentin, starting Flagyl500Tid, Align daily, Activia yogurt, & Immodium OTC prn);  She notes that her breathing is OK but notes some thick phlegm clogging up her throat & this is uncomfortable for her (we discussed Mucinex1200mg Bid + fluids, and MMW for her throat)... We reviewed the following medical problems during today's office visit >>     OSA> she refuses CPAP or Sleep Med consult to help w/ interface tolerability; states she is sleeping OK & denies issues w/  daytime hypersomnolence...    HxSinusitis> on MMW, Flonase, OTC antihist prn; c/o chr congestion, & prev infection treated w/ Levaquin etc..Marland Kitchen  COPD (GOLD Stage4- FEV1=20%> on O2 (Helios system- 2L/min continuously), NEBS w/ DuonebQid/ BudesBid, Mucinex2Bid; prev Hosp (4/16) w/ acute on chr resp failure as noted & improved on Levaquin & Pred taper (off now); chr dyspnea- ok w/ ADLs in general but SOB w/ chores/ housework/ walking in the house, etc; pCO2 in the 50 range but up to 80 prior to intubation 4/16; wt is stable at 209#, but not dieting & not exercising- we begged her to get on diet, increase exercise, and continue to work on weight reduction...    She saw DrMcDowell last 10/27> CAD w/ prev LAD intervention, hx takotsubo cardiomyopathy w/ prev EF=30-35%; she denied CP, SOB w/o change, feeling well; repeat contrast enhanced 2DEcho 11/4 showed improved LVF w/ EF=50-55% & normal wall motion... EXAM shows Afeb, VSS, O2sat=98% on 3L;  HEENT- neg, mallampati4;  Chest- decr BS bilat, w/o wheezing or consolidation;  Heart- RR gr1/6 SEM w/o r/g;  Abd- obese, active BS, soft, neg;  Ext- VI w/o c/c/e... IMP/PLAN>>  For the diarrhea: Stop the Augmentin & start FLAGYL500mg Tid, hold the Sen[place]ot-S & use OTC IMMODIUM prn, continue ALIGN & Activia yogurt;  For the throat symptoms: try MMW 1tsp gargle & swallow qid prn;  Continue her vigorous/ maximal chest regimen w/ O2, NEBs, Mucinex, etc...             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 Rx'd w/ MUCINEX, Nasal Saline, FLONASE...   ~  2/11:  c/o sinusitis & AUGMENTIN perscribed. ~  9/13:  ER visit w/ jaw pain- had wisdom tooth extracted 6/13 by DrLewis in [place]; CT showed socket left by right mandib molar extraction w/ inflamm; given PenVK, Vicodin, told f/u w/ dentist...  COPD (ICD-496) - she has COPD and is an ex-smoker... controlled on NEBULIZER w/ Duoneb Qid + Budesonide  Bid, MUCINEX, etc... she has end stage disease w/ severe dyspnea w/ ADLs and no change over the last yr... ~  baseline CXR = COPD, incr heart size, NAD. ~ CT Chest 2/02 showed COPD, Atx, sl scarring, NAD. ~  bronchoscopy 3/02 w/ mucous plugging, no endobr lesions. ~  PFT's in 12/07 showed FVC=0.81 (27%), FEV1=0.54 (22%), %1sec=67, mid-flows=13%pred. ~  CXR 1/10 showed chr changes, cardiomeg, lingular atx. ~  CXR 2/11 showed cardiomeg, bibasilar scarring, NAD. ~  CXR 2/12 showed cardiomeg, COPD,scarring left base, NAD. ~  CXR 5/14 showed cardiomeg, mild basilar atx/ flattened diaph/ NAD, DJD in Tspine, s/p chole... ~  9/14:  Presents w/ URI & AB exac- unresponsive to Augmentin called in, & rec Levaquin, Depo, Dosepak, Mucinex, Fluids, etc... ~  CXR 3/15 showed cardiomeg, COPD/ atelectasis in lingula & LLL, DJD in Tspine & shoulders, NAD... ~  6/15: on O2 (Helios- 2L/min continuously), NEBS w/ DuonebQid/ BudesBid, Mucinex2Bid; stable w/o exac but at baseline has cough, thick beige sput, no hemoptysis, & chr dyspnea- ok w/ ADLs in general but SOB w/ chores/ housework/ walking in the house, etc...  ~  PFT 6/15 showed FVC=0.81 (28%), FEV1=0.43 (20%), %1sec=53%, mid-flows= 9% predicted... c/w GOLD Stage4 COPD ~  12/15: she had a recent infectious exac treated by DrMcGowen w/ Levaquin & Pred; she is improved & approaching baseline, however she has gained 17# & not on diet & not exercising; we discussed need for diet, exercise & wt reduction; she will finish the Levaquin & Pred taper and continue the O2, NEBS, Mucinex, etc...  ~  12/15> Oxygen saturation on  2L/min at rest= 95% w/ pulse=64/min;  After walking 3 laps her O2sat=93% w/ pulse=84/min... Rec to continue same. 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  ~  Fall2016> she is stable on rx w/ on O2 (Helios system- 2L/min continuously), NEBS w/ DuonebQid/ BudesBid, Mucinex2Bid, etc...   SHE HAS ESTABLISHED PRIMARY CARE w/ Dr. Annette Stable A. Hasanaj in [place] & CARDIOLOGY w/ DrMcDowell >>   HYPERTENSION (ICD-401.9) - on ATENOLOL /d, LOSARTAN /d, LASIX /d... BP=126/80 and tol well... denies HA, visual changes, CP, palipit, dizziness, syncope, change in dyspnea, edema, etc.  CORONARY ARTERY DISEASE (ICD-414.00) - on ASA /d & Imdur30... hx of CAD w/ prev MI in 2000, and subseq PTCA's in 2000, & 2001... 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 ~  She saw DrMcDowell for Cards 10/27> CAD w/ prev LAD intervention, hx takotsubo cardiomyopathy w/ prev EF=30-35%; she denied CP, SOB w/o change, feeling well; repeat contrast enhanced 2DEcho 11/4 showed improved LVF w/ EF=50-55% & normal wall motion.  VENOUS INSUFFICIENCY (ICD-459.81) - low sodium diet, elevation, & Lasix 40-80mg /d... states she "allergic" to TED hose! and refuses to wear them...   HYPERLIPIDEMIA (ICD-272.4) - ZOCOR 80 changed to ATORVA40 in hosp 4/16 + FISH OIL /d ... she has been counselled on diet & exercise but unsuccessful...  DM (ICD-250.00) - HUMULIN 70/30- now taking 25uAM & 25uPM she says (off Actos due to cardiomyopathy) but control is poor w/ A1c>10; she has f/u w/ PCP soon...  OBESITY (ICD-278.00) - weight = 219# (BMI=37); we discussed diet + exercise program required  to lose weight!  GERD (ICD-530.81) - on PPI therapy (changed 2/10 per request to OMEPRAZOLE20mg /d)... ~  [facility] 3/12 for COPD exac & was anemic w/ heme pos stool & GIB; EGD by DrJacobs w/ gastritis & prepyloric ulcers, +HPylori- treated & improved.  CONSTIPATION (ICD-564.00) - last colonoscopy was 11/07 by DrJacobs and negative...  DEGENERATIVE JOINT DISEASE (ICD-715.90) - on Vicodin, Trmaadol, Neurontin; prev CSpine films w/ spondy and biforaminal narrowing... LSSpine films w/ DDD and osteopenia...   OSTEOPOROSIS (ICD-733.00) - on BONIVA /mo, Calcium, Vits...   ANXIETY (ICD-300.00) - uses Alprazolam 0.5mg  as needed...  Hx ANEMIA, MILD (ICD-285.9)  Health Maintenance - she hasn't seen GYN in yrs and is asked to re-establish... prev refused Mammograms due to the discomfort & bruising- she will discuss w/ Gyn & get a follow up BMD as well...   Past Surgical History  Procedure Laterality Date  . Total abdominal hysterectomy      DUB (non-malignant reasons).  No hx of abnormal pap smears.  . Shoulder open rotator cuff repair Right   . Cataract extraction w/phaco Left 06/30/2012    Procedure: CATARACT EXTRACTION PHACO AND INTRAOCULAR LENS PLACEMENT (IOC);  Surgeon: Gemma Payor, MD;  Location: [facility];  Service: Ophthalmology;  Laterality: Left;  CDE:12.32  . Transthoracic echocardiogram  06/2013    EF 40%, + WMA  . Alveoloplasty  10/1999    [name]/notes 07/19/2010 Surgical extraction of root segments #21, 22 and 27 with alveoloplasty.  . Cardiac catheterization  07/2012    + progressive CAD but no lesions to intervene upon, EF 65%; med mgmt continued  . Coronary angioplasty with stent placement      PTCA/BMS LAD 2000, PTCA/brachytherapy LAD 2001, LVEF 55%   . Coronary angioplasty with stent placement  2000; 2001; 2002    [name]/notes 05/18/2010  . Tonsillectomy  1949  . Laparoscopic cholecystectomy    . Cataract extraction w/ intraocular lens implant Right 2015?  Marland Kitchen. Left heart catheterization with  coronary angiogram N/A 06/25/2014    Procedure: LEFT HEART CATHETERIZATION WITH CORONARY ANGIOGRAM;  Surgeon: Lyn RecordsHenry W Smith, MD;  [facility];  Service: Cardiovascular;  Laterality: N/A;    Outpatient Encounter Prescriptions as of 05/12/2015  Medication Sig  . albuterol (PROAIR HFA) 108 (90 BASE) MCG/ACT inhaler Inhale 1-2 puffs into the lungs every 6 (six) hours as needed for wheezing or shortness of breath.  Marland Kitchen. albuterol (PROVENTIL) (2.5 MG/3ML) 0.083% nebulizer solution Take 3 mLs (2.5 mg total) by nebulization 4 (four) times daily.  Marland Kitchen. ALPRAZolam (XANAX) 0.5 MG tablet Take 1 tablet (0.5 mg total) by mouth 3 (three) times daily.  Marland Kitchen. aspirin 81 MG tablet Take 81 mg by mouth daily.  . bisoprolol (ZEBETA) 5 MG tablet Take 0.5 tablets (2.5 mg total) by mouth daily.  . budesonide (PULMICORT) 0.25 MG/2ML nebulizer solution Take 2 mLs (0.25 mg total) by nebulization 2 (two) times daily.  . calcium-vitamin D (OSCAL WITH D) 500-200 MG-UNIT per tablet Take 1 tablet by mouth 2 (two) times daily.    . citalopram (CELEXA) 10 MG tablet Take 10 mg by mouth daily.  Marland Kitchen. dextromethorphan-guaiFENesin (MUCINEX DM) 30-600 MG per 12 hr tablet Take 2 tablets by mouth every 12 (twelve) hours.    . ferrous sulfate 325 (65 FE) MG tablet Take 325 mg by mouth daily with breakfast.   . fluticasone (FLONASE) 50 MCG/ACT nasal spray Place 1 spray into both nostrils 2 times daily at 12 noon and 4 pm.  . glucose blood (ONE TOUCH ULTRA TEST) test strip TEST BLOOD SUGAR LEVELS THREE TIMES DAILY  . HYDROcodone-acetaminophen (NORCO/VICODIN) 5-325 MG per tablet Take 1 tablet by mouth 2 (two) times daily as needed for moderate pain.  Marland Kitchen. ibandronate (BONIVA) 150 MG tablet TAKE 1 TABLET EVERY MONTH ON THE SAME DAY  . insulin aspart protamine - aspart (NOVOLOG MIX 70/30 FLEXPEN) (70-30) 100 UNIT/ML FlexPen Inject 0.25 mLs (25 Units total) into the skin 2 (two) times daily with a meal. E11.8  . Insulin Lispro, Human, (HUMALOG [place])  Inject into the skin.  Marland Kitchen. ipratropium (ATROVENT) 0.02 % nebulizer solution Take 2.5 mLs (500 mcg total) by nebulization 4 (four) times daily.  . isosorbide mononitrate (IMDUR) 30 MG 24 hr tablet TAKE 1 TABLET DAILY  . losartan (COZAAR) 50 MG tablet TAKE ONE TABLET BY MOUTH ONCE DAILY  . Menthol, Topical Analgesic, (BENGAY PAIN RELIEF + MASSAGE) 2.5 % GEL Apply topically. Apply a small amount to affected area as needed  . metFORMIN (GLUCOPHAGE) 1000 MG tablet Take 1,000 mg by mouth 2 (two) times daily with a meal.  . Multiple Vitamin (MULTIVITAMIN) tablet Take 1 tablet by mouth daily.  . nitroGLYCERIN (NITROSTAT) 0.4 MG SL tablet Place 1 tablet (0.4 mg total) under  the tongue every 5 (five) minutes as needed. May repeat x3  . omeprazole (PRILOSEC) 20 MG capsule TAKE ONE CAPSULE BY MOUTH ONCE DAILY 30 MINUTES BEFORE A MEAL  . ONE TOUCH LANCETS MISC Check blood sugars two times daily  . potassium chloride (K-DUR,KLOR-CON) 10 MEQ tablet TAKE 1 TABLET DAILY  . Probiotic Product (PROBIOTIC DAILY PO) Take 1 tablet by mouth daily.   . sennosides-docusate sodium (SENOKOT-S) 8.6-50 MG tablet Take 1 tablet by mouth daily as needed for constipation.  . simvastatin (ZOCOR) 80 MG tablet Take 80 mg by mouth daily.  . sodium chloride (OCEAN) 0.65 % SOLN nasal spray Place 1 spray into both nostrils 2 (two) times daily as needed for congestion.  . torsemide (DEMADEX) 20 MG tablet Take 20 mg by mouth daily.  Lauris Poag INSULIN SYRINGE 30G X 5/16" 0.5 ML MISC USE 3 TIMES A DAY AS DIRECTED  . vitamin B-12 (CYANOCOBALAMIN) 1000 MCG tablet Take 1,000 mcg by mouth daily.  . vitamin C (ASCORBIC ACID) 500 MG tablet Take 500 mg by mouth daily.  . [DISCONTINUED] amoxicillin-clavulanate (AUGMENTIN) 875-125 MG tablet Take 1 tablet by mouth 2 (two) times daily. (Patient not taking: Reported on 05/12/2015)   No facility-administered encounter medications on file as of 05/12/2015.     Allergies  Allergen Reactions  . Codeine  Nausea And Vomiting    vomiting  . Terbutaline Sulfate     REACTION: INTOL to terbutaline    Current Medications, Allergies, Past Medical History, Past Surgical History, Family History, and Social History were reviewed in [employer] record.    Review of Systems         See HPI - all other systems neg except as noted...   The patient complains of dyspnea on exertion, peripheral edema, muscle weakness, and difficulty walking.  The patient denies anorexia, fever, weight loss, weight gain, vision loss, decreased hearing, hoarseness, chest pain, syncope, prolonged cough, headaches, hemoptysis, abdominal pain, melena, hematochezia, severe indigestion/heartburn, hematuria, incontinence, suspicious skin lesions, transient blindness, depression, unusual weight change, abnormal bleeding, enlarged lymph nodes, and angioedema.    Objective:   Physical Exam    WD, Obese, 75 y/o WF in NAD... GENERAL:  Alert & oriented; pleasant & cooperative...  HEENT:  [place]/AT, EOM-wnl, PERRLA, EACs-clear, TMs-wnl, NOSE-clear, THROAT-clear & wnl. NECK:  Supple w/ fairROM; no JVD; normal carotid impulses w/o bruits; no thyromegaly or nodules palpated; no lymphadenopathy. CHEST:  Decr BS bilat, few scat rhonchi, without wheezing, rales, or rubs...upper airway psuedo-wheezing HEART:  Regular Rhythm;  gr 1/6 SEM without rubs or gallops detected... ABDOMEN:  Obese w/ panniculus, soft & nontender; normal bowel sounds; no organomegaly or masses detected. EXT: without deformities, mild arthritic changes; no varicose veins/ +venous insuffic/ tr edema. Sl tender on palpation over lumbar area & siatic notch... NEURO:  CN's intact; no focal neuro deficits... DERM:  no lesion seen...  RADIOLOGY DATA:  Reviewed in the EPIC EMR & discussed w/ the patient...  LABORATORY DATA:  Reviewed in the EPIC EMR & discussed w/ the patient...   Assessment & Plan:    COPD>  continues home Oxygen, Nebulizer,  Mucinex, & Fluids;  Recent Hosp as above, now back on maint meds... OSA>  Can't find old data, sleep med notes, any download data, etc... She was not using CPAP for yrs... She will bring machine, may need to start over (addendum- she flat out refuses to use the CPAP).   HBP>  Good control on Atenolol50, Losartan50, Furosemide40;  discussed diet, must get wt down, no sodium... CAD>  Followed by DrMcDowell in [place] office- recent cath reviewed, no option for surg or intervention, continue medical management... Takotsubo Cardiomyopathy> see above (4/16 Hosp)   HYPERLIPID>  On Simva80 now + Fish Oil; needs better diet & get wt down!  DM>  Control remains poor as she has been unable to diet effectively, lose weight, etc; now on 70/30 insulin taking 30u Bid & A1c is 7.6 therefore rec incr to 35u Bid...  OBESITY>  Weight reduction is key to improvement in all areas...  GI> GERD, Ulcers, +HPylori, Constip>  Improved after HPylori Rx & back on Omep 20mg /d...  DJD, LBP, etc>  Lumbar XRay reviewed, trial Tramadol, Robaxin, rest/ heat/ etc... Refer to Ortho for further eval & Rx...  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  Anxiety>  Alprazolam for prn use- refilled...  Anemia>  On Fe daily and Hg is improved (s/p hosp w/ GIB)...   Patient's Medications  New Prescriptions   No medications on file  Previous Medications   ALBUTEROL (PROAIR HFA) 108 (90 BASE) MCG/ACT INHALER    Inhale 1-2 puffs into the lungs every 6 (six) hours as needed for wheezing or shortness of breath.   ALBUTEROL (PROVENTIL) (2.5 MG/3ML) 0.083% NEBULIZER SOLUTION    Take 3 mLs (2.5 mg total) by nebulization 4 (four) times daily.   ALPRAZOLAM (XANAX) 0.5 MG TABLET    Take 1 tablet (0.5 mg total) by mouth 3 (three) times daily.   ASPIRIN 81 MG TABLET    Take 81 mg by mouth daily.   BISOPROLOL (ZEBETA) 5 MG TABLET    Take 0.5 tablets (2.5 mg total) by mouth daily.   BUDESONIDE  (PULMICORT) 0.25 MG/2ML NEBULIZER SOLUTION    Take 2 mLs (0.25 mg total) by nebulization 2 (two) times daily.   CALCIUM-VITAMIN D (OSCAL WITH D) 500-200 MG-UNIT PER TABLET    Take 1 tablet by mouth 2 (two) times daily.     CITALOPRAM (CELEXA) 10 MG TABLET    Take 10 mg by mouth daily.   DEXTROMETHORPHAN-GUAIFENESIN (MUCINEX DM) 30-600 MG PER 12 HR TABLET    Take 2 tablets by mouth every 12 (twelve) hours.     FERROUS SULFATE 325 (65 FE) MG TABLET    Take 325 mg by mouth daily with breakfast.    FLUTICASONE (FLONASE) 50 MCG/ACT NASAL SPRAY    Place 1 spray into both nostrils 2 times daily at 12 noon and 4 pm.   GLUCOSE BLOOD (ONE TOUCH ULTRA TEST) TEST STRIP    TEST BLOOD SUGAR LEVELS THREE TIMES DAILY   HYDROCODONE-ACETAMINOPHEN (NORCO/VICODIN) 5-325 MG PER TABLET    Take 1 tablet by mouth 2 (two) times daily as needed for moderate pain.   IBANDRONATE (BONIVA) 150 MG TABLET    TAKE 1 TABLET EVERY MONTH ON THE SAME DAY   INSULIN ASPART PROTAMINE - ASPART (NOVOLOG MIX 70/30 FLEXPEN) (70-30) 100 UNIT/ML FLEXPEN    Inject 0.25 mLs (25 Units total) into the skin 2 (two) times daily with a meal. E11.8   INSULIN LISPRO, HUMAN, (HUMALOG Gibson)    Inject into the skin.   IPRATROPIUM (ATROVENT) 0.02 % NEBULIZER SOLUTION    Take 2.5 mLs (500 mcg total) by nebulization 4 (four) times daily.   ISOSORBIDE MONONITRATE (IMDUR) 30 MG 24 HR TABLET    TAKE 1 TABLET DAILY   LOSARTAN (COZAAR) 50 MG TABLET  TAKE ONE TABLET BY MOUTH ONCE DAILY   MENTHOL, TOPICAL ANALGESIC, (BENGAY PAIN RELIEF + MASSAGE) 2.5 % GEL    Apply topically. Apply a small amount to affected area as needed   METFORMIN (GLUCOPHAGE) 1000 MG TABLET    Take 1,000 mg by mouth 2 (two) times daily with a meal.   MULTIPLE VITAMIN (MULTIVITAMIN) TABLET    Take 1 tablet by mouth daily.   NITROGLYCERIN (NITROSTAT) 0.4 MG SL TABLET    Place 1 tablet (0.4 mg total) under the tongue every 5 (five) minutes as needed. May repeat x3   OMEPRAZOLE (PRILOSEC) 20 MG  CAPSULE    TAKE ONE CAPSULE BY MOUTH ONCE DAILY 30 MINUTES BEFORE A MEAL   ONE TOUCH LANCETS MISC    Check blood sugars two times daily   POTASSIUM CHLORIDE (K-DUR,KLOR-CON) 10 MEQ TABLET    TAKE 1 TABLET DAILY   PROBIOTIC PRODUCT (PROBIOTIC DAILY PO)    Take 1 tablet by mouth daily.    SENNOSIDES-DOCUSATE SODIUM (SENOKOT-S) 8.6-50 MG TABLET    Take 1 tablet by mouth daily as needed for constipation.   SIMVASTATIN (ZOCOR) 80 MG TABLET    Take 80 mg by mouth daily.   SODIUM CHLORIDE (OCEAN) 0.65 % SOLN NASAL SPRAY    Place 1 spray into both nostrils 2 (two) times daily as needed for congestion.   TORSEMIDE (DEMADEX) 20 MG TABLET    Take 20 mg by mouth daily.   TRUEPLUS INSULIN SYRINGE 30G X 5/16" 0.5 ML MISC    USE 3 TIMES A DAY AS DIRECTED   VITAMIN B-12 (CYANOCOBALAMIN) 1000 MCG TABLET    Take 1,000 mcg by mouth daily.   VITAMIN C (ASCORBIC ACID) 500 MG TABLET    Take 500 mg by mouth daily.  Modified Medications   No medications on file  Discontinued Medications   AMOXICILLIN-CLAVULANATE (AUGMENTIN) 875-125 MG TABLET    Take 1 tablet by mouth 2 (two) times daily.

## 2015-05-12 NOTE — Patient Instructions (Signed)
Today we updated your med list in our EPIC system...    Continue your breathing meds the same...     STOP the Augmentin antibiotic...  START the new FLAGYL (Metronidazole) 500mg  tabs- one tab three times daily...  Use the PROBIOTIC (eg- ALIGN) daily & add-in ACTIVIA Yogurt daily...  Use may also use OTC IMMODIUM - one tab every 4-6H as needed for watery diarrhea...  Be sure to drink plenty of fluids...   We will call-in DUKE's MOUTHWASH to help w/ your throat symptoms...    One tsp- gargle & swallow up to 4 times daily as needed...  Continue the MUCINEX 2tabs twice daily.Marland Kitchen..Marland Kitchen

## 2015-05-30 ENCOUNTER — Other Ambulatory Visit: Payer: Self-pay | Admitting: Licensed Clinical Social Worker

## 2015-05-30 ENCOUNTER — Encounter: Payer: Self-pay | Admitting: Licensed Clinical Social Worker

## 2015-05-30 NOTE — Patient Outreach (Signed)
Asessment:  CSW called client on 05/30/15 and spoke via phone with client. CSW verified client identity. CSW and Aileen spoke of client needs. She said she has appointment with Dr. Olena LeatherwoodHasanaj, her primary care doctor, in April of 2017.  She said copay payments due to see her specialists are expensive and it is difficult for her to make these copay payments to see her specialist.  She said she uses her oxygen in her home 24/7. She ues oxygen at 3 liters per nasal canula continuously.  She said she has portable oxygen canister to use in the community.  She said her daughters are supportive. Her daughters help client obtain grocery items as needed. Daughters of client help transport client to and from client medical appointments. She uses a cane to help her ambulate. She has her prescribed medications and is taking medications as prescribed. [facility]  She said she is eating well and sleeping well.  She said she has friends from church who can bring her food as needed. She has friends from church who call or visit client.  CSW spoke with client about insurance open enrollment period in the Fall of 2017. Client said she is considering changing insurance companies.  Client and CSW spoke of client care plan. CSW encouraged client to attend all client scheduled medical appointments in next 30 days. Client said she will try to attend all scheduled medical appointments in the next 30 days. CSW thanked Rina for phone conversation with CSW on 05/30/15. Lauraann was appreciative of phone call from CSW on 05/30/15.       Plan: Client to attend all scheduled client medical appointments in the next 30 days. CSW to call client in 3 weeks to assess client needs.  Kelton PillarMichael S.Remington Skalsky MSW, LCSW Licensed Clinical Social Worker [facility] Care Management [phone]

## 2015-06-20 ENCOUNTER — Other Ambulatory Visit: Payer: Self-pay | Admitting: Licensed Clinical Social Worker

## 2015-06-20 DIAGNOSIS — J441 Chronic obstructive pulmonary disease with (acute) exacerbation: Secondary | ICD-10-CM

## 2015-06-20 NOTE — Patient Outreach (Signed)
Assessment:  CSW spoke via phone with client on 06/20/15. CSW verified client identity. CSW and Anab spoke of client needs. Client said she has her prescribed medications and is taking medications as prescribed. She sees Dr. Horris LatinoHasanaji as her primary doctor.She sees Dr. Diona BrownerMcDowell as her cardiologist.  She said her daughters transport her to and from her scheduled medical appointments. She said she walks with assistance of a cane.  She said she fatigues easily and may become short of breath easily. She is using oxygen in the home as prescribed. She takes nebulizer treatments daily as prescribed. CSW and Tanajah spoke of client care plan. CSW encouraged Sameka to attend all scheduled client medical appointments in the next 30 days.  Client said she still has some financial challenges and it is sometimes difficult to make copay payments at her medical appointments.  She said her daughters are supportive. She has visits regularly from her daughters and phone calls from her daughters. He son calls her as well. Client said she is eating well and sleeping well.  She said she likes to walk as able to help her relax. She likes to talk with family and friends via phone.  She likes to watch TV and read her Bible as forms of relaxation.  Client asked if [facility] nurse could call her related to client nursing issues. She said that she would appreciate it if a [facility] telephonic RN called her to discuss her nursing needs. CSW informed client that CSW would make referral for [facility] tele[facility] nurse to call client to discuss client nursing needs. CSW encouraged client to call CSW at [phone] as needed to discuss social work needs of client.  Plan: Client to attend all scheduled client medical appointments in the next 30 days. CSW to call client in 3 weeks to assess client needs at that time. CSW to make client referral for [facility] tele[facility]onic RN support.   Kelton PillarMichael S.Obryan Radu MSW, LCSW Licensed Clinical Social Worker [facility] Care  Management [phone]

## 2015-06-21 ENCOUNTER — Other Ambulatory Visit: Payer: Self-pay | Admitting: *Deleted

## 2015-06-21 NOTE — Patient Outreach (Signed)
Referral received on 06/20/15 from [facility], RN CM called patient for outreach screening, no answer to telephone, left voicemail requesting return phone call.  Irving ShowsJulie Hollyn [facility], BSN [facility] Community Care Coordinator [phone]

## 2015-06-24 ENCOUNTER — Other Ambulatory Visit: Payer: Self-pay | Admitting: *Deleted

## 2015-06-24 ENCOUNTER — Ambulatory Visit: Payer: Self-pay | Admitting: *Deleted

## 2015-06-24 NOTE — Patient Outreach (Signed)
06/24/15- Second attempt telephonic outreach screening, no answer to telephone, left voicemail requesting return phone call.  Irving ShowsJulie Babetta Paterson [facility], BSN [facility] Community Care Coordinator [phone]

## 2015-06-30 ENCOUNTER — Telehealth: Payer: Self-pay | Admitting: Pulmonary Disease

## 2015-06-30 NOTE — Telephone Encounter (Signed)
Rx signed and faxed to [phone] Called to let [employer] know Rx faxed - have not received yet, could take several hours to show up in their system. Rx hanging on cork board in triage. Will hold in triage to follow up 07/01/15

## 2015-06-30 NOTE — Telephone Encounter (Signed)
Spoke with [employer], needing Rx for nebulizer supplies and frequency of use.  Needs to contain that the Rx is for the "Nebulizer Kit", height and weight of patient.  Placed on SN cart to sign. Needs to be faxed [phone] Please advise SN. Thanks.

## 2015-07-01 ENCOUNTER — Other Ambulatory Visit: Payer: Self-pay | Admitting: *Deleted

## 2015-07-01 ENCOUNTER — Ambulatory Visit: Payer: Self-pay | Admitting: *Deleted

## 2015-07-01 ENCOUNTER — Encounter: Payer: Self-pay | Admitting: *Deleted

## 2015-07-01 NOTE — Telephone Encounter (Signed)
Called and spoke with a representative at [employer]. The information that we faxed yesterday was received. Nothing further was needed.

## 2015-07-01 NOTE — Patient Outreach (Signed)
07/01/15- Telephone call to patient for 3rd attempt outreach screening to assess for nursing needs, no answer to telephone, left voicemail requesting return phone call, RN CM mailed letter to patient's home requesting return phone call, will keep case open for 10 days.  Irving ShowsJulie Farmer [facility]NC, BSN [facility] Community Care Coordinator [phone]

## 2015-07-11 ENCOUNTER — Other Ambulatory Visit: Payer: Self-pay | Admitting: Licensed Clinical Social Worker

## 2015-07-11 NOTE — Patient Outreach (Signed)
Assessment:  CSW spoke via phone with client on 07/11/15. CSW verified client identity. CSW and client spoke of client needs. Client said she has her prescribed medications and is taking medications as prescribed. She said that her daughters transport her as needed to and from her scheduled medical appointments. She said she walks with use of a cane. She said she is eating well and sleeping well. CSW and client spoke of client care plan. CSW encouraged client to attend all scheduled client medical appointments in the next 30 days.  Client said she likes to walk as form of relaxation. She reads her Bible to help her relax. She also said she talks via phone with family and friends as a means of relaxation. She said she uses oxygen as prescribed to help her with breathing. She said she takes nebulizer treatments as prescribed. CSW provided client with phone number of RN Irving ShowsJulie Farmer ([phone]). CSW informed client that RN Irving ShowsJulie Farmer had been seeking to contact client. Client was appreciative of receiving [facility] phone number for RN Irving ShowsJulie Farmer. CSW encouraged client to call CSW at [phone] as needed to discuss social work needs of client.    Plan:  Client to attend all scheduled client medical appointments in the next 30 days. CSW to collaborate as needed with RN Irving ShowsJulie Farmer in monitoring needs of client. CSW to call client in 4 weeks to assess client needs.   Kelton PillarMichael S.Milta Croson MSW, LCSW Licensed Clinical Social Worker [facility] Care Management [phone]

## 2015-07-12 ENCOUNTER — Ambulatory Visit: Payer: Medicare HMO | Admitting: Pulmonary Disease

## 2015-07-12 ENCOUNTER — Ambulatory Visit (INDEPENDENT_AMBULATORY_CARE_PROVIDER_SITE_OTHER)
Admission: RE | Admit: 2015-07-12 | Discharge: 2015-07-12 | Disposition: A | Discharge status: Home / Self Care | Payer: Medicare HMO | Source: Ambulatory Visit | Attending: Pulmonary Disease | Admitting: Pulmonary Disease

## 2015-07-12 ENCOUNTER — Encounter: Payer: Self-pay | Admitting: Pulmonary Disease

## 2015-07-12 ENCOUNTER — Other Ambulatory Visit (INDEPENDENT_AMBULATORY_CARE_PROVIDER_SITE_OTHER): Payer: Medicare HMO

## 2015-07-12 VITALS — BP 142/76 | HR 81 | Temp 99.5°F | Ht 64.0 in | Wt 212.6 lb

## 2015-07-12 DIAGNOSIS — J4489 Other specified chronic obstructive pulmonary disease: Secondary | ICD-10-CM

## 2015-07-12 DIAGNOSIS — E118 Type 2 diabetes mellitus with unspecified complications: Secondary | ICD-10-CM

## 2015-07-12 DIAGNOSIS — M159 Polyosteoarthritis, unspecified: Secondary | ICD-10-CM

## 2015-07-12 DIAGNOSIS — I5181 Takotsubo syndrome: Secondary | ICD-10-CM

## 2015-07-12 DIAGNOSIS — I10 Essential (primary) hypertension: Secondary | ICD-10-CM | POA: Diagnosis not present

## 2015-07-12 DIAGNOSIS — E669 Obesity, unspecified: Secondary | ICD-10-CM

## 2015-07-12 DIAGNOSIS — I2510 Atherosclerotic heart disease of native coronary artery without angina pectoris: Secondary | ICD-10-CM

## 2015-07-12 DIAGNOSIS — J449 Chronic obstructive pulmonary disease, unspecified: Secondary | ICD-10-CM | POA: Diagnosis not present

## 2015-07-12 DIAGNOSIS — J9611 Chronic respiratory failure with hypoxia: Secondary | ICD-10-CM | POA: Diagnosis not present

## 2015-07-12 DIAGNOSIS — Z9861 Coronary angioplasty status: Secondary | ICD-10-CM

## 2015-07-12 DIAGNOSIS — I214 Non-ST elevation (NSTEMI) myocardial infarction: Secondary | ICD-10-CM

## 2015-07-12 DIAGNOSIS — G4733 Obstructive sleep apnea (adult) (pediatric): Secondary | ICD-10-CM | POA: Diagnosis not present

## 2015-07-12 DIAGNOSIS — M150 Primary generalized (osteo)arthritis: Secondary | ICD-10-CM

## 2015-07-12 LAB — CBC WITH DIFFERENTIAL/PLATELET
BASOS PCT: 0.2 % (ref 0.0–3.0)
Basophils Absolute: 0 10*3/uL (ref 0.0–0.1)
EOS PCT: 1.9 % (ref 0.0–5.0)
Eosinophils Absolute: 0.2 10*3/uL (ref 0.0–0.7)
HCT: 34.9 % — ABNORMAL LOW (ref 36.0–46.0)
HEMOGLOBIN: 11.6 g/dL — AB (ref 12.0–15.0)
Lymphocytes Relative: 23.8 % (ref 12.0–46.0)
Lymphs Abs: 2.1 10*3/uL (ref 0.7–4.0)
MCHC: 33.4 g/dL (ref 30.0–36.0)
MCV: 89.7 fl (ref 78.0–100.0)
MONO ABS: 0.9 10*3/uL (ref 0.1–1.0)
MONOS PCT: 9.6 % (ref 3.0–12.0)
Neutro Abs: 5.7 10*3/uL (ref 1.4–7.7)
Neutrophils Relative %: 64.5 % (ref 43.0–77.0)
Platelets: 279 10*3/uL (ref 150.0–400.0)
RBC: 3.89 Mil/uL (ref 3.87–5.11)
RDW: 12.5 % (ref 11.5–15.5)
WBC: 8.9 10*3/uL (ref 4.0–10.5)

## 2015-07-12 LAB — BASIC METABOLIC PANEL
BUN: 21 mg/dL (ref 6–23)
CHLORIDE: 97 meq/L (ref 96–112)
CO2: 34 meq/L — AB (ref 19–32)
CREATININE: 1.23 mg/dL — AB (ref 0.40–1.20)
Calcium: 9.1 mg/dL (ref 8.4–10.5)
GFR: 45.25 mL/min — ABNORMAL LOW (ref >60.00)
Glucose, Bld: 333 mg/dL — ABNORMAL HIGH (ref 70–99)
POTASSIUM: 4.3 meq/L (ref 3.5–5.1)
Sodium: 138 mEq/L (ref 135–145)

## 2015-07-12 LAB — BRAIN NATRIURETIC PEPTIDE: Pro B Natriuretic peptide (BNP): 84 pg/mL (ref 0.0–100.0)

## 2015-07-12 NOTE — Patient Instructions (Signed)
Today we updated your med list in our EPIC system...    Continue your current medications the same-- esp the NEBULIZER w/ DUONEB 4 times daily & BUDESONIDE twice daily...  Today we rechecked your CXR & targeted lab work...    We will contact you w/ the results when available...   Stay as active as possible, avoid infections, call for any questions...  Let's plan a follow up visit in 57mo, sooner if needed for problems.Marland Kitchen..Marland Kitchen

## 2015-07-12 NOTE — Progress Notes (Signed)
Subjective:    Patient ID: [name], female    DOB: Jul 31, 1940, 75 y.o.   MRN: [id number]  HPI 75 y/o WF here for a follow up visit... [name] has 13 grandchildren & 18 great-grands!  She also has mult med problems including severe COPD; HBP; CAD w/prev MI & PTCA followed by DrMcDowell; VI; Hyperlipidemia; DM on insulin; Obesity; GERD/ constipation; DJD/ osteoporosis; Anxiety...   ~  SEE PREV EPIC NOTES FOR OLDER DATA >>    LABS 5/14:  FLP- not at goals w/ TG=227, HDL=33;  Chems- ok x BS=160, A1c=7.3;  CBC- wnl;  TSH=2.09...  LABS 12/14:  chems- ok x BS=203, A1c=7.6 on her Metform500Bid & 70/30 insulin 30uBid=> Rec to incr to 35uBid + diet & exercise...  CXR 3/15 showed cardiomeg, COPD/ atelectasis in lingula & LLL, DJD in Tspine & shoulders, NAD...  PFT 6/15 showed FVC=0.81 (28%), FEV1=0.43 (20%), %1sec=53%, mid-flows= 9% predicted... c/w GOLD Stage4 COPD.  Dec2015> she continues to REFUSE Flu shots and Pneumonia vaccine...  12/15: Oxygen saturation on 2L/min at rest= 95% w/ pulse=64/min;  After walking 3 laps her O2sat=93% w/ pulse=84/min... Rec to continue same.    ~  August 04, 2014:  5mo ROV & Edithe was [facility] 4/20 - 06/29/14 by [facility] "I just got sick- a little bit of everything"> DCSummary says COPD exac w/ acute on chr resp failure, OSA on CPAP, NSTEMI, cardiomyopathy, DM, HL, RI, etc... 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  We reviewed the following medical problems during today's office visit >>     OSA> on CPAP but she wasn't using this prior to adm, she will use it regularly & bring machine in for a download at next visit, I  cannot find initial sleep study or Sleep Med notes in epic...    HxSinusitis> on MMW, Flonase, OTC antihist prn; c/o chr congestion, & prev infection treated by DrMcGowen w/ Levaquin...    COPD (GOLD Stage4)> on O2 (Helios system- 2L/min continuously), NEBS w/ DuonebQid/ BudesBid, Mucinex2Bid; +recent Hosp w/ acute on chr resp failure as noted & improved on Levaquin & Pred taper (off now); chr dyspnea- ok w/ ADLs in general but SOB w/ chores/ housework/ walking in the house, etc; pCO2 in the 50 range but up to 80 prior to intubation 4/16; she has gained wt, not dieting & not exercising- we read her the riot act!    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    BP is controlled on above adjusted meds & measures 104/58 today... She denies CP, palpit, ch in SOB, etc on her ASA81 & Imdur30...     Chol is regulated w/ Atorva40 now- last FLP 7/15 showed elev TG & she is reminded to follow a strict low fat diet & work on weight reduction, & f/u w/ PCP & Cards.Marland KitchenMarland Kitchen  DM control is poor on her 70/30 insulin taking 25u Bid per Primary Care; Labs 4/16 showed BS=72-338, A1c= 10.2; she will f/u w/ her PCP soon...    Obesity> she diureses to 98 Kg (216#) during her Hosp; now weighs 219.6 (BMI=37) and she desperately needs to lose the weight...    She remains on Prilosec20, [id number], Alprazolam; but off prev Neurontin, Lodine/Tramadol, Vicodin... We reviewed prob list, meds, xrays and labs> [facility] -  reviewed...  Uses [facility] for neb meds and supplies, ?CPAP as well?  CXR 5/16 showed cardiomeg, bibasilar atx vs infiltrate-- treated w/ Levaquin PLAN>>  Continue current regimen, bring  all med bottles to every visit, needs to use CPAP nightly & bring machine for check & download if poss...   ~  August 31, 2014:  4wk ROV & [name] did not bring med list or bottles to today's visit; furthermore she tells me that she refused the CPAP- doesn't like the mask/ machine/ etc and refuses to work w/ Sleep Med to help tolerabliity/ interface problems, etc... She doesn't know her meds and is again asked to keep accurate med list handy w/ any changes from her diff providers...    OSA> she refuses CPAP or Sleep Med consult to help w/ tolerability...    HxSinusitis> on MMW, Flonase, OTC antihist prn; c/o chr congestion, & prev infection treated by DrMcGowen w/ Levaquin...    COPD (GOLD Stage4)> on O2 (Helios system- 2L/min continuously), NEBS w/ DuonebQid/ BudesBid, Mucinex2Bid; +recent Hosp w/ acute on chr resp failure as noted & improved on Levaquin & Pred taper (off now); chr dyspnea- ok w/ ADLs in general but SOB w/ chores/ housework/ walking in the house, etc; pCO2 in the 50 range but up to 80 prior to intubation 4/16; she has gained wt up to 220#, not dieting & not exercising- we begged her to get on diet, increase exercise, and get wt down...    She is overdue for CARDS f/u & she will call DrMcDowell's office to set this up; her PCP changed Demadex to Furosemide 40mg /d... We reviewed prob list, meds, xrays and labs>  PLAN>>  Continue current regimen & call for questions; reminded to bring all med bottles to each & every doctor visit; needs to incr her exercise program & get wt down! she will call for CARDS f/u appt & see me back in 6-8wks...  ~  October 26, 2014:  39mo ROV & [name] indicates "I'm-a-doin" but notes that she was [facility] 7/31 - 10/06/14 with a "mini-stroke" she says; they had stopped her ASA prior to the event & it was restarted after the episode;  She notes that her breathing is OK & she says they told her that her CXR was fine- on NEBS w/ Brovana & Pulmicort BID, plus  Albuterol&Atrovent nebs & Mucinex prn... She remains on Demadex20Bid & Diamox250Bid (see BMet below)... She is followed by DrMcDowell in the [place] [facility] office- his note from 09/22/14 is reviewed...  EXAM shows Afeb, VSS, O2sat=95% on 2-3L by McKean;  HEENT- neg, mallampati4;  Chest- decr BS at bases, few scat rhonchi, w/o w/r/consolidation;  Abd- obese, soft, neg;  Ext- VI, tr edema, no c/c;  Neuro- no focal neuro deficits...  LABS 10/2014:  Chems-  HCO3=33, K=3.8,  BUN=23, Cr=1.28 -- stable, continue same meds...  IMP/PLAN>>  She is stable from the pulmonary standpoint, continue same meds, encouraged incr activity/ exercise; ROV in  79mo sooner if needed...  ADDENDUM>>  10/30/14 CT Chest, Abd, Pelvis  after a fall showed mild cardiomeg, aortic & coronary calcif, bibasilar atx vs fibrosis. NAD; s/p GB, calcif Ao, bilat renal stones, subxiphoid abd wall hernia containing fat, s/p Hyst, norm appendix, DJD spine w/ ant compression of T2 & L4...   ~  January 26, 2015:  198mo ROV & Sybol is about the same but c/o incr congestion despite the Mucinex 2Bid & fluids, it is worst in the AM & she is reminded to keep a NEB rx handy to use when she wakes up & continue all meds regularly etc;  We decided to treat w/ Depo80, Medrol dosepak, & refill her Proair for prn use when out & about... Epic records indicate aot of THN activity;  Her PCP is DrHasanaj    OSA> she refuses CPAP or Sleep Med consult to help w/ interface tolerability; states she is sleeping OK...    HxSinusitis> on MMW, Flonase, OTC antihist prn; c/o chr congestion, & prev infection treated w/ Levaquin...    COPD (GOLD Stage4- FEV1=20%> on O2 (Helios system- 2L/min continuously), NEBS w/ DuonebQid/ BudesBid, Mucinex2Bid; prev Hosp (4/16) w/ acute on chr resp failure as noted & improved on Levaquin & Pred taper (off now); chr dyspnea- ok w/ ADLs in general but SOB w/ chores/ housework/ walking in the house, etc; pCO2 in the 50 range but up to 80 prior to  intubation 4/16; wt is down to 208#, but not dieting & not exercising- we begged her to get on diet, increase exercise, and continue to work on weight reduction...    She saw DrMcDowell for Cards 10/27> CAD w/ prev LAD intervention, hx takotsubo cardiomyopathy w/ prev EF=30-35%; she denied CP, SOB w/o change, feeling well; repeat contrast enhanced 2DEcho 11/4 showed improved LVF w/ EF=50-55% & normal wall motion... EXAM shows Afeb, VSS, O2sat=98% on 3L;  HEENT- neg, mallampati4;  Chest- decr BS bilat, end exp wheeze w/o consolidation;  Heart- RR gr1/6 SEM w/o r/g;  Abd- obese, soft, neg;  Ext- VI w/o c/c/e...  2DEcho 01/03/15 showed mild LVH, reduced LVF w/ EF=40-45%, Gr1DD, there was a regional wall motion abn w/ mod HK in apex & mild HK in other areas, septal dysynergy...  2DEcho repeated w/ contrast 01/07/15 by DrMcDowell> LV sys function was normal w/ EF=50-55%, normal wall motion... IMP/PLAN>>  Amoura has some increased congestion & we discussed Rx w/ Depo80 + Medrol dosepak;  LVF is improved, breathing is stable, medical management from [facility] (her PCP)... We will continue w/ 198mo ROVs.  ~  May 12, 2015:  3-22mo ROV & Yazmen has been well managed since her last OV w/ 16 entries in EPIC by [facility]- pt outreach; she reports that she did satis over the holidays, but notes onset of URI/sinus ~2wks ago w/ eval by her PCP [facility]- Rx w/ antibiotic, then Augmentin, but now c/o diarrhea despite taking Align & Activia yogurt (we discussed stopping the Augmentin, starting Flagyl500Tid, Align daily, Activia yogurt, & Immodium OTC prn);  She notes that her breathing is OK but notes some thick phlegm clogging up her throat & this is uncomfortable for her (we discussed Mucinex1200mg Bid + fluids, and MMW for her throat)... We reviewed the following medical problems during today's office visit >>     OSA> she refuses CPAP or Sleep Med consult to help w/ interface tolerability; states she is sleeping OK & denies issues w/  daytime hypersomnolence...    HxSinusitis> on MMW, Flonase, OTC antihist prn; c/o chr congestion, & prev infection treated w/ Levaquin etc..Marland Kitchen  COPD (GOLD Stage4- FEV1=20%> on O2 (Helios system- 2L/min continuously), NEBS w/ DuonebQid/ BudesBid, Mucinex2Bid; prev Hosp (4/16) w/ acute on chr resp failure as noted & improved on Levaquin & Pred taper (off now); chr dyspnea- ok w/ ADLs in general but SOB w/ chores/ housework/ walking in the house, etc; pCO2 in the 50 range but up to 80 prior to intubation 4/16; wt is stable at 209#, but not dieting & not exercising- we begged her to get on diet, increase exercise, and continue to work on weight reduction...    She saw DrMcDowell last 10/27> CAD w/ prev LAD intervention, hx takotsubo cardiomyopathy w/ prev EF=30-35%; she denied CP, SOB w/o change, feeling well; repeat contrast enhanced 2DEcho 11/4 showed improved LVF w/ EF=50-55% & normal wall motion... EXAM shows Afeb, VSS, O2sat=98% on 3L;  HEENT- neg, mallampati4;  Chest- decr BS bilat, w/o wheezing or consolidation;  Heart- RR gr1/6 SEM w/o r/g;  Abd- obese, active BS, soft, neg;  Ext- VI w/o c/c/e... IMP/PLAN>>  For the diarrhea: Stop the Augmentin & start FLAGYL500mg Tid, hold the Senakot-S & use OTC IMMODIUM prn, continue ALIGN & Activia yogurt;  For the throat symptoms: try MMW 1tsp gargle & swallow qid prn;  Continue her vigorous/ maximal chest regimen w/ O2, NEBs, Mucinex, etc...   ~  Jul 12, 2015:  81mo ROV & Arihanna has been followed closely by her PCP [facility] and [facility] team;  She remains on O2 at 3L/min, NEBS w/ DuonebQID & BudesBID, plus Mucinex1200mg Bid;  She notes nasal congestion, drainage, cough w/ yellow sput => due for f/u CXR & blood work today (see below)... We reviewed the following medical problems during today's office visit >>     OSA> she refuses CPAP or Sleep Med consult to help w/ interface tolerability; states she is sleeping OK & denies issues w/ daytime hypersomnolence...     HxSinusitis> on MMW, Flonase, OTC antihist prn; c/o chr congestion, & prev infection treated w/ Levaquin etc...    COPD (GOLD Stage4- FEV1=20%, chronic hypoxemic & hypercarbic resp failure> on O2 (3L/min continuously), NEBS w/ DuonebQid/ BudesBid, Mucinex2Bid; prev Hosp (4/16) w/ acute on chr resp failure as noted & improved on Levaquin & Pred taper; chr dyspnea- ok w/ ADLs in general but SOB w/ chores/ housework/ walking in the house, etc; pCO2 in the 50 range but up to 80 prior to intubation 4/16; wt is 212#, but not dieting & not exercising- we begged her to get on diet, increase exercise, and continue to work on weight reduction...    HBP, CAD, Takotsubo cardioyopathy> She saw DrMcDowell last 10/16> CAD w/ prev LAD intervention, hx takotsubo cardiomyopathy w/ prev EF=30-35%; she denied CP, SOB w/o change, feeling well; repeat contrast enhanced 2DEcho 11/16 showed improved LVF w/ EF=50-55% & normal wall motion... 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    EXAM shows Afeb, VSS, O2sat=98% on 3L;  HEENT- neg, mallampati4;  Chest- decr BS bilat, w/o wheezing or consolidation;  Heart- RR gr1/6 SEM w/o r/g;  Abd- obese, active BS, soft, neg;  Ext- VI w/o c/c/e...  CXR 07/12/15>  Cardiomegaly w/o change, increased markings & subtle atrea of increased density in left suprahilar region, scarring at the bases...  LABS 07/12/15>  Chems- ok x BS=333, Cr=1.23, HCO3=34;  BNP=84;  CBC- ok x Hg=11.6 IMP/PLANS>>  Unknown has advanced multisystem disease, appears reasonably stable on her current regimen but BS clearly not under adeq control- meds per DrHasanaj (Novolog70/30 + Metformin);  She has ROV w/ her PCP and mult specialists over the next month;  We will plan ROV 27mo...         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 Rx'd w/ MUCINEX, Nasal Saline, FLONASE...   ~  2/11:  c/o sinusitis & AUGMENTIN perscribed. ~  9/13:  ER visit w/ jaw pain- had wisdom tooth extracted 6/13 by DrLewis in [place]; CT showed socket left by right mandib molar extraction w/ inflamm; given PenVK, Vicodin, told f/u w/ dentist...  COPD (ICD-496) - she has COPD and is an ex-smoker... controlled on NEBULIZER w/ Duoneb Qid + Budesonide Bid, MUCINEX, etc... she has end stage disease w/ severe dyspnea w/ ADLs and no change over the last yr... ~  baseline CXR = COPD, incr heart size, NAD. ~ CT Chest 2/02 showed COPD, Atx, sl scarring, NAD. ~  bronchoscopy 3/02 w/ mucous plugging, no endobr lesions. ~  PFT's in 12/07 showed FVC=0.81 (27%), FEV1=0.54 (22%), %1sec=67, mid-flows=13%pred. ~  CXR 1/10 showed chr changes, cardiomeg, lingular atx. ~  CXR 2/11 showed cardiomeg, bibasilar scarring, NAD. ~  CXR 2/12 showed cardiomeg, COPD,scarring left base, NAD. ~  CXR 5/14 showed cardiomeg, mild basilar atx/ flattened diaph/ NAD, DJD in Tspine, s/p chole... ~  9/14:  Presents w/ URI & AB exac- unresponsive to Augmentin called in, & rec Levaquin, Depo, Dosepak, Mucinex, Fluids, etc... ~  CXR 3/15 showed cardiomeg, COPD/ atelectasis in lingula & LLL, DJD in Tspine & shoulders, NAD... ~  6/15: on O2 (Helios- 2L/min continuously), NEBS w/ DuonebQid/ BudesBid, Mucinex2Bid; stable w/o exac but at baseline has cough, thick beige sput, no hemoptysis, & chr dyspnea- ok w/ ADLs in general but SOB w/ chores/ housework/ walking in the house, etc...  ~  PFT 6/15 showed FVC=0.81 (28%), FEV1=0.43 (20%), %1sec=53%, mid-flows= 9% predicted... c/w GOLD Stage4 COPD ~  12/15: she had a recent infectious exac treated by DrMcGowen w/ Levaquin & Pred; she is improved & approaching baseline, however she has gained 17# & not on diet & not exercising; we discussed need  for diet, exercise & wt reduction; she will finish the Levaquin & Pred taper and continue the O2, NEBS, Mucinex, etc...  ~  12/15> Oxygen saturation on 2L/min at rest= 95% w/ pulse=64/min;  After walking 3 laps her O2sat=93% w/ pulse=84/min... Rec to continue same. 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  ~  Fall2016> she is stable on rx w/ on O2 (Helios system- 2L/min continuously), NEBS w/ DuonebQid/ BudesBid, Mucinex2Bid, etc...   SHE HAS ESTABLISHED PRIMARY CARE w/ Dr. Annette Stable A. Hasanaj in [place] & CARDIOLOGY w/ DrMcDowell >>   HYPERTENSION (ICD-401.9) - on ATENOLOL 50mg /d, LOSARTAN 50mg /d, LASIX 40mg /d... BP=126/80 and tol well... denies HA, visual changes, CP, palipit,  dizziness, syncope, change in dyspnea, edema, etc.  CORONARY ARTERY DISEASE (ICD-414.00) - on ASA 81mg /d & Imdur30... hx of CAD w/ prev MI in 2000, and subseq PTCA's in 2000, & 2001... 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 ~  She saw DrMcDowell for Cards 10/27> CAD w/ prev LAD intervention, hx takotsubo cardiomyopathy w/ prev EF=30-35%; she denied CP, SOB w/o change, feeling well; repeat contrast enhanced 2DEcho 11/4 showed improved LVF w/ EF=50-55% & normal wall motion.  VENOUS INSUFFICIENCY (ICD-459.81) - low sodium diet,  elevation, & Lasix 40-80mg /d... states she "allergic" to TED hose! and refuses to wear them...   HYPERLIPIDEMIA (ICD-272.4) - ZOCOR 80 changed to ATORVA40 in hosp 4/16 + FISH OIL 1000mg /d ... she has been counselled on diet & exercise but unsuccessful...  DM (ICD-250.00) - HUMULIN 70/30- now taking 25uAM & 25uPM she says (off Actos due to cardiomyopathy) but control is poor w/ A1c>10; she has f/u w/ PCP soon...  OBESITY (ICD-278.00) - weight = 219# (BMI=37); we discussed diet + exercise program required to lose weight!  GERD (ICD-530.81) - on PPI therapy (changed 2/10 per request to OMEPRAZOLE20mg /d)... ~  [facility] 3/12 for COPD exac & was anemic w/ heme pos stool & GIB; EGD by DrJacobs w/ gastritis & prepyloric ulcers, +HPylori- treated & improved.  CONSTIPATION (ICD-564.00) - last colonoscopy was 11/07 by DrJacobs and negative...  DEGENERATIVE JOINT DISEASE (ICD-715.90) - on Vicodin, Trmaadol, Neurontin; prev CSpine films w/ spondy and biforaminal narrowing... LSSpine films w/ DDD and osteopenia...   OSTEOPOROSIS (ICD-733.00) - on BONIVA 150mg /mo, Calcium, Vits...   ANXIETY (ICD-300.00) - uses Alprazolam 0.5mg  as needed...  Hx ANEMIA, MILD (ICD-285.9)  Health Maintenance - she hasn't seen GYN in yrs and is asked to re-establish... prev refused Mammograms due to the discomfort & bruising- she will discuss w/ Gyn & get a follow up BMD as well...   Past Surgical History  Procedure Laterality Date  . Total abdominal hysterectomy      DUB (non-malignant reasons).  No hx of abnormal pap smears.  . Shoulder open rotator cuff repair Right   . Cataract extraction w/phaco Left 06/30/2012    Procedure: CATARACT EXTRACTION PHACO AND INTRAOCULAR LENS PLACEMENT (IOC);  Surgeon: Gemma Payor, MD;  Location: [facility];  Service: Ophthalmology;  Laterality: Left;  CDE:12.32  . Transthoracic echocardiogram  06/2013    EF 40%, + WMA  . Alveoloplasty  10/1999    [name] 07/19/2010 Surgical extraction of root  segments #21, 22 and 27 with alveoloplasty.  . Cardiac catheterization  07/2012    + progressive CAD but no lesions to intervene upon, EF 65%; med mgmt continued  . Coronary angioplasty with stent placement      PTCA/BMS LAD 2000, PTCA/brachytherapy LAD 2001, LVEF 55%   . Coronary angioplasty with stent placement  2000; 2001; 2002    [name] 05/18/2010  . Tonsillectomy  1949  . Laparoscopic cholecystectomy    . Cataract extraction w/ intraocular lens implant Right 2015?  Marland Kitchen Left heart catheterization with coronary angiogram N/A 06/25/2014    Procedure: LEFT HEART CATHETERIZATION WITH CORONARY ANGIOGRAM;  Surgeon: Lyn Records, MD;  Location: [facility] CATH LAB;  Service: Cardiovascular;  Laterality: N/A;    Outpatient Encounter Prescriptions as of 07/12/2015  Medication Sig  . albuterol (PROAIR HFA)  108 (90 BASE) MCG/ACT inhaler Inhale 1-2 puffs into the lungs every 6 (six) hours as needed for wheezing or shortness of breath.  Marland Kitchen albuterol (PROVENTIL) (2.5 MG/3ML) 0.083% nebulizer solution Take 3 mLs (2.5 mg total) by nebulization 4 (four) times daily.  Marland Kitchen ALPRAZolam (XANAX) 0.5 MG tablet Take 1 tablet (0.5 mg total) by mouth 3 (three) times daily.  Marland Kitchen aspirin 81 MG tablet Take 81 mg by mouth daily.  . bisoprolol (ZEBETA) 5 MG tablet Take 0.5 tablets (2.5 mg total) by mouth daily.  . budesonide (PULMICORT) 0.25 MG/2ML nebulizer solution Take 2 mLs (0.25 mg total) by nebulization 2 (two) times daily.  . calcium-vitamin D (OSCAL WITH D) 500-200 MG-UNIT per tablet Take 1 tablet by mouth 2 (two) times daily.    . citalopram (CELEXA) 10 MG tablet Take 10 mg by mouth daily.  Marland Kitchen dextromethorphan-guaiFENesin (MUCINEX DM) 30-600 MG per 12 hr tablet Take 2 tablets by mouth every 12 (twelve) hours.    . Diphenhyd-Hydrocort-Nystatin (FIRST-DUKES MOUTHWASH) SUSP Gargle and swallow 1 tsp four times daily  . ferrous sulfate 325 (65 FE) MG tablet Take 325 mg by mouth daily with breakfast.   . fluticasone (FLONASE) 50  MCG/ACT nasal spray Place 1 spray into both nostrils 2 times daily at 12 noon and 4 pm.  . glucose blood (ONE TOUCH ULTRA TEST) test strip TEST BLOOD SUGAR LEVELS THREE TIMES DAILY  . HYDROcodone-acetaminophen (NORCO/VICODIN) 5-325 MG per tablet Take 1 tablet by mouth 2 (two) times daily as needed for moderate pain.  Marland Kitchen ibandronate (BONIVA) 150 MG tablet TAKE 1 TABLET EVERY MONTH ON THE SAME DAY  . insulin aspart protamine - aspart (NOVOLOG MIX 70/30 FLEXPEN) (70-30) 100 UNIT/ML FlexPen Inject 0.25 mLs (25 Units total) into the skin 2 (two) times daily with a meal. E11.8  . Insulin Lispro, Human, (HUMALOG [place]) Inject into the skin.  Marland Kitchen ipratropium (ATROVENT) 0.02 % nebulizer solution Take 2.5 mLs (500 mcg total) by nebulization 4 (four) times daily.  . isosorbide mononitrate (IMDUR) 30 MG 24 hr tablet TAKE 1 TABLET DAILY  . losartan (COZAAR) 50 MG tablet TAKE ONE TABLET BY MOUTH ONCE DAILY  . Menthol, Topical Analgesic, (BENGAY PAIN RELIEF + MASSAGE) 2.5 % GEL Apply topically. Apply a small amount to affected area as needed  . metFORMIN (GLUCOPHAGE) 1000 MG tablet Take 1,000 mg by mouth 2 (two) times daily with a meal.  . Multiple Vitamin (MULTIVITAMIN) tablet Take 1 tablet by mouth daily.  . nitroGLYCERIN (NITROSTAT) 0.4 MG SL tablet Place 1 tablet (0.4 mg total) under the tongue every 5 (five) minutes as needed. May repeat x3  . omeprazole (PRILOSEC) 20 MG capsule TAKE ONE CAPSULE BY MOUTH ONCE DAILY 30 MINUTES BEFORE A MEAL  . ONE TOUCH LANCETS MISC Check blood sugars two times daily  . potassium chloride (K-DUR,KLOR-CON) 10 MEQ tablet TAKE 1 TABLET DAILY  . Probiotic Product (PROBIOTIC DAILY PO) Take 1 tablet by mouth daily.   . sennosides-docusate sodium (SENOKOT-S) 8.6-50 MG tablet Take 1 tablet by mouth daily as needed for constipation.  . simvastatin (ZOCOR) 80 MG tablet Take 80 mg by mouth daily.  . sodium chloride (OCEAN) 0.65 % SOLN nasal spray Place 1 spray into both nostrils 2 (two)  times daily as needed for congestion.  . torsemide (DEMADEX) 20 MG tablet Take 20 mg by mouth daily.  Lauris Poag INSULIN SYRINGE 30G X 5/16" 0.5 ML MISC USE 3 TIMES A DAY AS DIRECTED  . vitamin B-12 (CYANOCOBALAMIN)  1000 MCG tablet Take 1,000 mcg by mouth daily.  . vitamin C (ASCORBIC ACID) 500 MG tablet Take 500 mg by mouth daily.  . [DISCONTINUED] metroNIDAZOLE (FLAGYL) 500 MG tablet Take 1 tablet (500 mg total) by mouth 3 (three) times daily. (Patient not taking: Reported on 07/12/2015)   No facility-administered encounter medications on file as of 07/12/2015.     Allergies  Allergen Reactions  . Codeine Nausea And Vomiting    vomiting  . Terbutaline Sulfate     REACTION: INTOL to terbutaline    Current Medications, Allergies, Past Medical History, Past Surgical History, Family History, and Social History were reviewed in [facility] electronic medical record.    Review of Systems         See HPI - all other systems neg except as noted...   The patient complains of dyspnea on exertion, peripheral edema, muscle weakness, and difficulty walking.  The patient denies anorexia, fever, weight loss, weight gain, vision loss, decreased hearing, hoarseness, chest pain, syncope, prolonged cough, headaches, hemoptysis, abdominal pain, melena, hematochezia, severe indigestion/heartburn, hematuria, incontinence, suspicious skin lesions, transient blindness, depression, unusual weight change, abnormal bleeding, enlarged lymph nodes, and angioedema.    Objective:   Physical Exam    WD, Obese, 75 y/o WF in NAD... GENERAL:  Alert & oriented; pleasant & cooperative...  HEENT:  [place]/AT, EOM-wnl, PERRLA, EACs-clear, TMs-wnl, NOSE-clear, THROAT-clear & wnl. NECK:  Supple w/ fairROM; no JVD; normal carotid impulses w/o bruits; no thyromegaly or nodules palpated; no lymphadenopathy. CHEST:  Decr BS bilat, few scat rhonchi, without wheezing, rales, or rubs...upper airway psuedo-wheezing HEART:  Regular  Rhythm;  gr 1/6 SEM without rubs or gallops detected... ABDOMEN:  Obese w/ panniculus, soft & nontender; normal bowel sounds; no organomegaly or masses detected. EXT: without deformities, mild arthritic changes; no varicose veins/ +venous insuffic/ tr edema. Sl tender on palpation over lumbar area & siatic notch... NEURO:  CN's intact; no focal neuro deficits... DERM:  no lesion seen...  RADIOLOGY DATA:  Reviewed in the EPIC EMR & discussed w/ the patient...  LABORATORY DATA:  Reviewed in the EPIC EMR & discussed w/ the patient...   Assessment & Plan:    COPD, chr resp failure>  continues home Oxygen, Nebulizer, Mucinex, & Fluids- needs to be diligent regarding meds... OSA>  Can't find old data, sleep med notes, any download data, etc... She was not using CPAP for yrs... She will bring machine, may need to start over (addendum- she flat out refuses to use the CPAP).   HBP>  Good control on Atenolol50, Losartan50, Furosemide40; discussed diet, must get wt down, no sodium... CAD>  [place]owell [place]- recent cath reviewed, no option for surg or intervention, continue medical management... Takotsubo Cardiomyopathy> see above (4/16 Hosp)   HYPERLIPID>  On Simva80 now + Fish Oil; needs better diet & get wt down!  DM>  Control remains poor as she has been unable to diet effectively, lose weight, etc; now on 70/30 insulin taking 30u Bid & A1c is 7.6 therefore rec incr to 35u Bid...  OBESITY>  Weight reduction is key to improvement in all areas...  GI> GERD, Ulcers, +HPylori, Constip>  Improved after HPylori Rx & back on Omep 20mg /d...  DJD, LBP, etc>  Lumbar XRay reviewed, trial Tramadol, Robaxin, rest/ heat/ etc... Refer to Ortho for further eval & Rx...  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  Anxiety>  Alprazolam for prn use-  refilled...  Anemia>  On Fe daily and Hg is improved (s/p hosp w/ GIB)...   Patient's  Medications  New Prescriptions   No medications on file  Previous Medications   ALBUTEROL (PROAIR HFA) 108 (90 BASE) MCG/ACT INHALER    Inhale 1-2 puffs into the lungs every 6 (six) hours as needed for wheezing or shortness of breath.   ALBUTEROL (PROVENTIL) (2.5 MG/3ML) 0.083% NEBULIZER SOLUTION    Take 3 mLs (2.5 mg total) by nebulization 4 (four) times daily.   ALPRAZOLAM (XANAX) 0.5 MG TABLET    Take 1 tablet (0.5 mg total) by mouth 3 (three) times daily.   ASPIRIN 81 MG TABLET    Take 81 mg by mouth daily.   BISOPROLOL (ZEBETA) 5 MG TABLET    Take 0.5 tablets (2.5 mg total) by mouth daily.   BUDESONIDE (PULMICORT) 0.25 MG/2ML NEBULIZER SOLUTION    Take 2 mLs (0.25 mg total) by nebulization 2 (two) times daily.   CALCIUM-VITAMIN D (OSCAL WITH D) 500-200 MG-UNIT PER TABLET    Take 1 tablet by mouth 2 (two) times daily.     CITALOPRAM (CELEXA) 10 MG TABLET    Take 10 mg by mouth daily.   DEXTROMETHORPHAN-GUAIFENESIN (MUCINEX DM) 30-600 MG PER 12 HR TABLET    Take 2 tablets by mouth every 12 (twelve) hours.     DIPHENHYD-HYDROCORT-NYSTATIN (FIRST-DUKES MOUTHWASH) SUSP    Gargle and swallow 1 tsp four times daily   FERROUS SULFATE 325 (65 FE) MG TABLET    Take 325 mg by mouth daily with breakfast.    FLUTICASONE (FLONASE) 50 MCG/ACT NASAL SPRAY    Place 1 spray into both nostrils 2 times daily at 12 noon and 4 pm.   GLUCOSE BLOOD (ONE TOUCH ULTRA TEST) TEST STRIP    TEST BLOOD SUGAR LEVELS THREE TIMES DAILY   HYDROCODONE-ACETAMINOPHEN (NORCO/VICODIN) 5-325 MG PER TABLET    Take 1 tablet by mouth 2 (two) times daily as needed for moderate pain.   IBANDRONATE (BONIVA) 150 MG TABLET    TAKE 1 TABLET EVERY MONTH ON THE SAME DAY   INSULIN ASPART PROTAMINE - ASPART (NOVOLOG MIX 70/30 FLEXPEN) (70-30) 100 UNIT/ML FLEXPEN    Inject 0.25 mLs (25 Units total) into the skin 2 (two) times daily with a meal. E11.8   INSULIN LISPRO, HUMAN, (HUMALOG [place])    Inject into the skin.   IPRATROPIUM (ATROVENT) 0.02 %  NEBULIZER SOLUTION    Take 2.5 mLs (500 mcg total) by nebulization 4 (four) times daily.   ISOSORBIDE MONONITRATE (IMDUR) 30 MG 24 HR TABLET    TAKE 1 TABLET DAILY   LOSARTAN (COZAAR) 50 MG TABLET    TAKE ONE TABLET BY MOUTH ONCE DAILY   MENTHOL, TOPICAL ANALGESIC, (BENGAY PAIN RELIEF + MASSAGE) 2.5 % GEL    Apply topically. Apply a small amount to affected area as needed   METFORMIN (GLUCOPHAGE) 1000 MG TABLET    Take 1,000 mg by mouth 2 (two) times daily with a meal.   MULTIPLE VITAMIN (MULTIVITAMIN) TABLET    Take 1 tablet by mouth daily.   NITROGLYCERIN (NITROSTAT) 0.4 MG SL TABLET    Place 1 tablet (0.4 mg total) under the tongue every 5 (five) minutes as needed. May repeat x3   OMEPRAZOLE (PRILOSEC) 20 MG CAPSULE    TAKE ONE CAPSULE BY MOUTH ONCE DAILY 30 MINUTES BEFORE A MEAL   ONE TOUCH LANCETS MISC    Check blood sugars two times daily   POTASSIUM  CHLORIDE (K-DUR,KLOR-CON) 10 MEQ TABLET    TAKE 1 TABLET DAILY   PROBIOTIC PRODUCT (PROBIOTIC DAILY PO)    Take 1 tablet by mouth daily.    SENNOSIDES-DOCUSATE SODIUM (SENOKOT-S) 8.6-50 MG TABLET    Take 1 tablet by mouth daily as needed for constipation.   SIMVASTATIN (ZOCOR) 80 MG TABLET    Take 80 mg by mouth daily.   SODIUM CHLORIDE (OCEAN) 0.65 % SOLN NASAL SPRAY    Place 1 spray into both nostrils 2 (two) times daily as needed for congestion.   TORSEMIDE (DEMADEX) 20 MG TABLET    Take 20 mg by mouth daily.   TRUEPLUS INSULIN SYRINGE 30G X 5/16" 0.5 ML MISC    USE 3 TIMES A DAY AS DIRECTED   VITAMIN B-12 (CYANOCOBALAMIN) 1000 MCG TABLET    Take 1,000 mcg by mouth daily.   VITAMIN C (ASCORBIC ACID) 500 MG TABLET    Take 500 mg by mouth daily.  Modified Medications   No medications on file  Discontinued Medications   METRONIDAZOLE (FLAGYL) 500 MG TABLET    Take 1 tablet (500 mg total) by mouth 3 (three) times daily.

## 2015-07-13 ENCOUNTER — Encounter: Payer: Self-pay | Admitting: *Deleted

## 2015-07-13 ENCOUNTER — Other Ambulatory Visit: Payer: Self-pay | Admitting: *Deleted

## 2015-07-13 NOTE — Patient Outreach (Signed)
07/13/15- Case closed for RN CM, unable to reach pt after multiple attempts and letter was mailed to patient's home and pt did not respond back to RN CM,  Informed CSW case closed.  Faxed letter to MD reporting unable to reach pt.  Irving ShowsJulie Farmer [facility], BSN [facility] Community Care Coordinator [phone]

## 2015-08-09 ENCOUNTER — Other Ambulatory Visit: Payer: Self-pay | Admitting: Licensed Clinical Social Worker

## 2015-08-09 NOTE — Patient Outreach (Signed)
Assessment:  CSW spoke via phone with client on 08/09/15. CSW verified client identity. CSW and client spoke of client needs. Client said she has her prescribed medications and is taking medications as prescribed. She said she is eating well and sleeping well. She sees Dr.Hasanaji as her primary care doctor. Client had appointment with Dr. Horris LatinoHasanaji several weeks ago.  She said her daughters transport her to and from her scheduled medical appointments. She walks with use of a cane. CSW and client spoke of client care plan. CSW encouraged client to attend all scheduled client medical appointments in next 30 days. Client uses oxygen as prescribed to help with breathing. She said she likes to walk to relax. She reads her Bible to relax. She enjoys speaking via phone with family and friends as a means of socialization. CSW reminded client that [facility] support is free to client due to her being seen by Dr. Horris LatinoHasanaji as her primary care doctor.  CSW thanked client for phone call with CSW on 08/09/15.   Plan:  Client to attend all scheduled client medical appointments in next 30 days. CSW to call client in 4 weeks to assess needs of client.  Kelton PillarMichael S.Rene Gonsoulin MSW, LCSW Licensed Clinical Social Worker [facility] Care Management [phone]

## 2015-08-11 ENCOUNTER — Telehealth: Payer: Self-pay | Admitting: Pulmonary Disease

## 2015-08-11 MED ORDER — AMOXICILLIN-POT CLAVULANATE 875-125 MG PO TABS: 1.0000 | ORAL_TABLET | Freq: Two times a day (BID) | ORAL | Status: DC

## 2015-08-11 NOTE — Telephone Encounter (Signed)
Spoke with pt. States that she is having issues with sinus congestion. PND, cough and SOB. Cough is producing yellow mucus. Has not tried and OTC medications. Symptoms started 3 days ago. Wants something called into the pharmacy. SN - please advise. Thanks.  Allergies  Allergen Reactions  . Codeine Nausea And Vomiting    vomiting  . Terbutaline Sulfate     REACTION: INTOL to terbutaline

## 2015-08-11 NOTE — Telephone Encounter (Signed)
Per SN: For sinuses use Augmentin 1 po bid #20 Take align probiotic qd Use nasal saline q4h prn Continue flonase bid- 2 spr each nostril  Spoke with pt, aware of results/recs.  rx sent to preferred pharmacy.  Nothing further needed.

## 2015-08-18 ENCOUNTER — Encounter: Payer: Self-pay | Admitting: Cardiology

## 2015-08-18 ENCOUNTER — Ambulatory Visit (INDEPENDENT_AMBULATORY_CARE_PROVIDER_SITE_OTHER): Payer: Commercial Managed Care - HMO | Admitting: Cardiology

## 2015-08-18 VITALS — BP 136/72 | HR 88 | Ht 65.0 in | Wt 211.0 lb

## 2015-08-18 DIAGNOSIS — E785 Hyperlipidemia, unspecified: Secondary | ICD-10-CM

## 2015-08-18 DIAGNOSIS — I10 Essential (primary) hypertension: Secondary | ICD-10-CM | POA: Diagnosis not present

## 2015-08-18 DIAGNOSIS — I5181 Takotsubo syndrome: Secondary | ICD-10-CM | POA: Diagnosis not present

## 2015-08-18 DIAGNOSIS — I2510 Atherosclerotic heart disease of native coronary artery without angina pectoris: Secondary | ICD-10-CM | POA: Diagnosis not present

## 2015-08-18 NOTE — Patient Instructions (Signed)
Your physician wants you to follow-up in: 6 months Dr McDowell You will receive a reminder letter in the mail two months in advance. If you don't receive a letter, please call our office to schedule the follow-up appointment.  Your physician recommends that you continue on your current medications as directed. Please refer to the Current Medication list given to you today.    If you need a refill on your cardiac medications before your next appointment, please call your pharmacy.       Thank you for choosing [facility] !         

## 2015-08-18 NOTE — Progress Notes (Signed)
Cardiology Office Note  Date: 08/18/2015   ID: Elzena, Muston 1940-11-25, MRN [id number]  PCP: Toma Deiters, MD  Primary Cardiologist: Nona Dell, MD   Chief Complaint  Patient presents with  . Coronary Artery Disease  . History of cardiomyopathy    History of Present Illness: [name] is a 75 y.o. female last seen in October 2016. She presents today with her daughter for a follow-up visit. Overall no major change in cardiac status. She reports having occasional brief episodes of chest pain, but does not need to use nitroglycerin. She remains on oxygen with chronic lung disease and follows closely with Dr. Kriste Basque.  I reviewed her ECG today which shows sinus rhythm with PVC and IVCD of left bundle branch block type.  We went over her medications. Cardiac regimen includes aspirin, bisoprolol, Cozaar, and Imdur, Demadex, and Zocor. She has not required any nitroglycerin. Follow-up echocardiogram done after her last encounter showed LVEF 50-55%. We discussed the results today.  She is fairly inactive, does not get outdoors much, particularly in the high heat and humidity.  Past Medical History  Diagnosis Date  . COPD (chronic obstructive pulmonary disease) (HCC)   . Hyperlipidemia   . Essential hypertension, benign   . Anemia   . Anxiety   . Coronary atherosclerosis of native coronary artery     a. history of MI with PTCA/stent to LAD 07/1998. b. Repeat PTCA to this lesion in 02/1999. c. PTCA/brachytherapy to mLAD in 2001  . Osteoporosis   . Venous insufficiency   . Obesity   . Asthma   . Herpes zoster 06/2013  . Chronic respiratory failure (HCC)   . GERD (gastroesophageal reflux disease)   . On home oxygen therapy   . Myocardial infarction (HCC) 2000  . OSA (obstructive sleep apnea)   . Type 2 diabetes mellitus (HCC)   . History of bleeding ulcers   . DDD (degenerative disc disease), lumbar   . Takotsubo cardiomyopathy     April 2016    Past Surgical History    Procedure Laterality Date  . Total abdominal hysterectomy      DUB (non-malignant reasons).  No hx of abnormal pap smears.  . Shoulder open rotator cuff repair Right   . Cataract extraction w/phaco Left 06/30/2012    Procedure: CATARACT EXTRACTION PHACO AND INTRAOCULAR LENS PLACEMENT (IOC);  Surgeon: Gemma Payor, MD;  Location: [facility];  Service: Ophthalmology;  Laterality: Left;  CDE:12.32  . Transthoracic echocardiogram  06/2013    EF 40%, + WMA  . Alveoloplasty  10/1999    [name] 07/19/2010 Surgical extraction of root segments #21, 22 and 27 with alveoloplasty.  . Cardiac catheterization  07/2012    + progressive CAD but no lesions to intervene upon, EF 65%; med mgmt continued  . Coronary angioplasty with stent placement      PTCA/BMS LAD 2000, PTCA/brachytherapy LAD 2001, LVEF 55%   . Coronary angioplasty with stent placement  2000; 2001; 2002    [name] 05/18/2010  . Tonsillectomy  1949  . Laparoscopic cholecystectomy    . Cataract extraction w/ intraocular lens implant Right 2015?  Marland Kitchen Left heart catheterization with coronary angiogram N/A 06/25/2014    Procedure: LEFT HEART CATHETERIZATION WITH CORONARY ANGIOGRAM;  Surgeon: Lyn Records, MD;  Location: [facility] CATH LAB;  Service: Cardiovascular;  Laterality: N/A;    Current Outpatient Prescriptions  Medication Sig Dispense Refill  . albuterol (PROAIR HFA) 108 (90 BASE) MCG/ACT inhaler Inhale 1-2  puffs into the lungs every 6 (six) hours as needed for wheezing or shortness of breath. 18 g 2  . albuterol (PROVENTIL) (2.5 MG/3ML) 0.083% nebulizer solution Take 3 mLs (2.5 mg total) by nebulization 4 (four) times daily. 1080 mL 3  . ALPRAZolam (XANAX) 0.5 MG tablet Take 1 tablet (0.5 mg total) by mouth 3 (three) times daily. 90 tablet 0  . amoxicillin-clavulanate (AUGMENTIN) 875-125 MG tablet Take 1 tablet by mouth 2 (two) times daily. 20 tablet 0  . aspirin 81 MG tablet Take 81 mg by mouth daily.    . bisoprolol (ZEBETA) 5 MG tablet Take 0.5  tablets (2.5 mg total) by mouth daily. 45 tablet 0  . budesonide (PULMICORT) 0.25 MG/2ML nebulizer solution Take 2 mLs (0.25 mg total) by nebulization 2 (two) times daily. 180 mL 11  . calcium-vitamin D (OSCAL WITH D) 500-200 MG-UNIT per tablet Take 1 tablet by mouth 2 (two) times daily.      . citalopram (CELEXA) 10 MG tablet Take 10 mg by mouth daily.    Marland Kitchen dextromethorphan-guaiFENesin (MUCINEX DM) 30-600 MG per 12 hr tablet Take 2 tablets by mouth every 12 (twelve) hours.      . Diphenhyd-Hydrocort-Nystatin (FIRST-DUKES MOUTHWASH) SUSP Gargle and swallow 1 tsp four times daily 240 mL 1  . ferrous sulfate 325 (65 FE) MG tablet Take 325 mg by mouth daily with breakfast.     . fluticasone (FLONASE) 50 MCG/ACT nasal spray Place 1 spray into both nostrils 2 times daily at 12 noon and 4 pm.    . glucose blood (ONE TOUCH ULTRA TEST) test strip TEST BLOOD SUGAR LEVELS THREE TIMES DAILY 100 each 5  . HYDROcodone-acetaminophen (NORCO/VICODIN) 5-325 MG per tablet Take 1 tablet by mouth 2 (two) times daily as needed for moderate pain.    Marland Kitchen ibandronate (BONIVA) 150 MG tablet TAKE 1 TABLET EVERY MONTH ON THE SAME DAY 1 tablet 1  . insulin aspart protamine - aspart (NOVOLOG MIX 70/30 FLEXPEN) (70-30) 100 UNIT/ML FlexPen Inject 0.25 mLs (25 Units total) into the skin 2 (two) times daily with a meal. E11.8 15 mL 11  . Insulin Lispro, Human, (HUMALOG [place]) Inject into the skin.    Marland Kitchen ipratropium (ATROVENT) 0.02 % nebulizer solution Take 2.5 mLs (500 mcg total) by nebulization 4 (four) times daily. 1080 mL 11  . isosorbide mononitrate (IMDUR) 30 MG 24 hr tablet TAKE 1 TABLET DAILY 90 tablet 1  . losartan (COZAAR) 50 MG tablet TAKE ONE TABLET BY MOUTH ONCE DAILY 30 tablet 3  . Menthol, Topical Analgesic, (BENGAY PAIN RELIEF + MASSAGE) 2.5 % GEL Apply topically. Apply a small amount to affected area as needed    . metFORMIN (GLUCOPHAGE) 1000 MG tablet Take 1,000 mg by mouth 2 (two) times daily with a meal.    . Multiple  Vitamin (MULTIVITAMIN) tablet Take 1 tablet by mouth daily.    . nitroGLYCERIN (NITROSTAT) 0.4 MG SL tablet Place 1 tablet (0.4 mg total) under the tongue every 5 (five) minutes as needed. May repeat x3 25 tablet 6  . omeprazole (PRILOSEC) 20 MG capsule TAKE ONE CAPSULE BY MOUTH ONCE DAILY 30 MINUTES BEFORE A MEAL 30 capsule 3  . ONE TOUCH LANCETS MISC Check blood sugars two times daily 100 each 6  . potassium chloride (K-DUR,KLOR-CON) 10 MEQ tablet TAKE 1 TABLET DAILY 30 tablet 3  . Probiotic Product (PROBIOTIC DAILY PO) Take 1 tablet by mouth daily.     . sennosides-docusate sodium (SENOKOT-S) 8.6-50 MG  tablet Take 1 tablet by mouth daily as needed for constipation.    . simvastatin (ZOCOR) 80 MG tablet Take 80 mg by mouth daily.    . sodium chloride (OCEAN) 0.65 % SOLN nasal spray Place 1 spray into both nostrils 2 (two) times daily as needed for congestion.    . torsemide (DEMADEX) 20 MG tablet Take 20 mg by mouth daily.    Lauris Poag. TRUEPLUS INSULIN SYRINGE 30G X 5/16" 0.5 ML MISC USE 3 TIMES A DAY AS DIRECTED 100 each 6  . vitamin B-12 (CYANOCOBALAMIN) 1000 MCG tablet Take 1,000 mcg by mouth daily.    . vitamin C (ASCORBIC ACID) 500 MG tablet Take 500 mg by mouth daily.     No current facility-administered medications for this visit.   Allergies:  Codeine and Terbutaline sulfate   Social History: The patient  reports that she quit smoking about 17 years ago. Her smoking use included Cigarettes. She has a 30 pack-year smoking history. She has never used smokeless tobacco. She reports that she does not drink alcohol or use illicit drugs.   Family History: The patient's family history includes Coronary artery disease in her other; Heart attack in her brother and father; Stroke in her brother, mother, and sister.   ROS:  Please see the history of present illness. Otherwise, complete review of systems is positive for chronic dyspnea on exertion, intermittent chest congestion.  All other systems are  reviewed and negative.   Physical Exam: VS:  BP 136/72 mmHg  Pulse 88  Ht 5\' 5"  (1.651 m)  Wt 211 lb (95.709 kg)  BMI 35.11 kg/m2  SpO2 96%, BMI Body mass index is 35.11 kg/(m^2).  Wt Readings from Last 3 Encounters:  08/18/15 211 lb (95.709 kg)  07/12/15 212 lb 9.6 oz (96.435 kg)  05/12/15 209 lb 9.6 oz (95.074 kg)    Chronically ill-appearing woman in no distress. Wearing oxygen via nasal cannula.  HEENT: Conjunctiva and lids normal, oropharynx clear.  Neck: Supple, no elevated JVP or bruits.  Lungs: Diminished breath sounds throughout, prolonged expiratory phase, nonlabored.  Cardiac: Regular rate and rhythm, no S3 gallop or rub.  Abdomen: Nontender, bowel sounds present.  Skin: Warm and dry.  Extremities: Trace ankle edema bilaterally. Musculoskeletal: Mild kyphosis. Neuropsychiatric: Alert and oriented 3, affect appropriate.  ECG: I personally reviewed the tracing from 07/06/2014 which showed sinus tachycardia with PACs and IVCD consistent with left bundle branch block with increased voltage.  Recent Labwork: 07/12/2015: BUN 21; Creatinine, Ser 1.23*; Hemoglobin 11.6*; Platelets 279.0; Potassium 4.3; Pro B Natriuretic peptide (BNP) 84.0; Sodium 138     Component Value Date/Time   CHOL 167 09/21/2013 0951   TRIG 376.0* 09/21/2013 0951   HDL 36.20* 09/21/2013 0951   CHOLHDL 5 09/21/2013 0951   VLDL 75.2* 09/21/2013 0951   LDLCALC 56 09/21/2013 0951   LDLDIRECT 73.9 07/16/2012 1054    Other Studies Reviewed Today:  Echocardiogram with Definity contrast 01/07/2015: Study Conclusions  - This is a limited study with echocontrast to evaluate LVEF. - Left ventricle: Systolic function was normal. The estimated  ejection fraction was in the range of 50% to 55%. Wall motion was  normal; there were no regional wall motion abnormalities.  Cardiac catheterization 06/25/2014: Left mainstem: The left main is patent with mild 20-30% distal left mainstem stenosis. The  vessel divides into the LAD and left circumflex.  Left anterior descending (LAD): The LAD is patent. There is moderate proximal LAD stenosis just before the stented segment  with associated calcification. I would estimate this at 60%. There is also moderate in-stent restenosis of 50-60% in the proximal portion of the stented segment in the mid LAD. The remainder of the stent has mild diffuse 30% in-stent restenosis. The mid and distal LAD have diffuse disease with at most 75% stenosis. Diagonal branches are patent.  Left circumflex (LCx): The left circumflex is patent. There is a small intermediate branch. The obtuse marginal branches are patent without significant stenoses. There is mild irregularity throughout the circumflex distribution. The left circumflex is dominant.  Right coronary artery (RCA): Small, nondominant vessel. There is an 80% stenosis in the proximal RCA which is unchanged from the previous study. This vessel only supplies the RV marginal branches.  Left ventriculography: Deferred   Estimated Blood Loss: Minimal  Final Conclusions:  Stable 2 vessel coronary artery disease compared to previous cardiac catheterization from 2014. There is severe stenosis of a small nondominant right coronary artery which should be managed medically, and moderate stenosis of the LAD with no progression of disease compared to the previous study  Assessment and Plan:  1. CAD status post previous intervention to the LAD, most recent cardiac catheterization from last year showed moderate LAD disease and a severely diseased small nondominant RCA, both of which are being managed medically. She reports stable symptoms on medical therapy.  2. History of stress induced cardiomyopathy, improvement in LVEF to the range of 50-55% by last testing. Continue medical therapy.  3. Essential hypertension, blood pressure control is adequate today.  4. Hyperlipidemia, continues on Zocor. LDL has been in good range  over time, 50-70.  Current medicines were reviewed with the patient today.   Orders Placed This Encounter  Procedures  . EKG 12-Lead    Disposition: Follow-up with me in 6 months.  Signed, Jonelle Sidle, MD, [facility] 08/18/2015 10:39 AM    [facility] at [facility] [address] [place], [place] [address] Phone: [phone]; Fax: [phone]

## 2015-08-29 ENCOUNTER — Encounter: Payer: Self-pay | Admitting: Licensed Clinical Social Worker

## 2015-08-29 ENCOUNTER — Other Ambulatory Visit: Payer: Self-pay | Admitting: Licensed Clinical Social Worker

## 2015-08-29 NOTE — Patient Outreach (Signed)
Assessment:  CSW spoke via phone with client on 08/29/15. CSW verified client identity. CSW and client spoke of client needs. Client said she had her prescribed medications and is taking medications as prescribed. She said she is eating well and sleeping well.  She said she uses a cane to help her ambulate. She said her daughters transport her as needed to and from client's scheduled medical appointments. She sees Dr. Amanda Pea as her primary care doctor. She said she has been attending all scheduled medical appointments as planned. Thus, she has met her care plan goal with [facility] CSW services. CSW informed client that since client had met her care plan goal with CSW services, that CSW would discharge client from [place] services on 08/29/15. Client agreed to this plan. CSW congratulated client on working with [facility] program and congratulated her on reaching her care plan goal with [facility] CSW services. CSW thanked client for phone call with CSW on 08/29/15. [name] was appreciative of support of [facility] program. [name] was appreciative of support of [facility] CSW.     Plan:  CSW discharged [name] from [place] on 08/29/15 since client had met her care plan goals with [facility] CSW services. CSW to inform [facility] on 08/29/15 that [place] discharged client from [place] on 08/29/15. CSW to fax physician case closure letter to Dr. Amanda Pea informing Dr. Amanda Pea that [place] discharged client from [place] services on 08/29/15 since client had met her care plan goal with CSW services.[name].[name] MSW, LCSW Licensed Clinical Social Worker [facility] Care Management [phone]

## 2015-09-05 ENCOUNTER — Ambulatory Visit: Payer: Self-pay | Admitting: Family Medicine

## 2015-09-09 ENCOUNTER — Ambulatory Visit: Payer: Self-pay | Admitting: Licensed Clinical Social Worker

## 2015-10-05 ENCOUNTER — Ambulatory Visit (INDEPENDENT_AMBULATORY_CARE_PROVIDER_SITE_OTHER): Payer: Medicare HMO | Admitting: Family Medicine

## 2015-10-05 ENCOUNTER — Ambulatory Visit (INDEPENDENT_AMBULATORY_CARE_PROVIDER_SITE_OTHER): Payer: Medicare HMO

## 2015-10-05 ENCOUNTER — Encounter: Payer: Self-pay | Admitting: Family Medicine

## 2015-10-05 VITALS — BP 125/67 | HR 67 | Temp 98.7°F | Ht 62.5 in | Wt 220.0 lb

## 2015-10-05 DIAGNOSIS — M150 Primary generalized (osteo)arthritis: Secondary | ICD-10-CM | POA: Diagnosis not present

## 2015-10-05 DIAGNOSIS — J9611 Chronic respiratory failure with hypoxia: Secondary | ICD-10-CM

## 2015-10-05 DIAGNOSIS — E785 Hyperlipidemia, unspecified: Secondary | ICD-10-CM

## 2015-10-05 DIAGNOSIS — E669 Obesity, unspecified: Secondary | ICD-10-CM

## 2015-10-05 DIAGNOSIS — N183 Chronic kidney disease, stage 3 unspecified: Secondary | ICD-10-CM

## 2015-10-05 DIAGNOSIS — J4489 Other specified chronic obstructive pulmonary disease: Secondary | ICD-10-CM

## 2015-10-05 DIAGNOSIS — D72829 Elevated white blood cell count, unspecified: Secondary | ICD-10-CM | POA: Diagnosis not present

## 2015-10-05 DIAGNOSIS — J449 Chronic obstructive pulmonary disease, unspecified: Secondary | ICD-10-CM

## 2015-10-05 DIAGNOSIS — E1142 Type 2 diabetes mellitus with diabetic polyneuropathy: Secondary | ICD-10-CM | POA: Diagnosis not present

## 2015-10-05 DIAGNOSIS — I255 Ischemic cardiomyopathy: Secondary | ICD-10-CM

## 2015-10-05 DIAGNOSIS — I214 Non-ST elevation (NSTEMI) myocardial infarction: Secondary | ICD-10-CM

## 2015-10-05 DIAGNOSIS — G4733 Obstructive sleep apnea (adult) (pediatric): Secondary | ICD-10-CM

## 2015-10-05 DIAGNOSIS — M159 Polyosteoarthritis, unspecified: Secondary | ICD-10-CM

## 2015-10-05 DIAGNOSIS — E118 Type 2 diabetes mellitus with unspecified complications: Secondary | ICD-10-CM | POA: Diagnosis not present

## 2015-10-05 LAB — URINALYSIS
Bilirubin, UA: NEGATIVE
GLUCOSE, UA: NEGATIVE
KETONES UA: NEGATIVE
NITRITE UA: NEGATIVE
Protein, UA: NEGATIVE
RBC UA: NEGATIVE
SPEC GRAV UA: 1.01 (ref 1.005–1.030)
UUROB: 0.2 mg/dL (ref 0.2–1.0)
pH, UA: 5 (ref 5.0–7.5)

## 2015-10-05 LAB — BAYER DCA HB A1C WAIVED: HB A1C: 7.1 % — AB (ref <7.0)

## 2015-10-05 MED ORDER — TORSEMIDE 20 MG PO TABS: 20.0000 mg | ORAL_TABLET | Freq: Every day | ORAL | 5 refills | Status: AC

## 2015-10-05 MED ORDER — OMEPRAZOLE 20 MG PO CPDR: DELAYED_RELEASE_CAPSULE | ORAL | 11 refills | Status: AC

## 2015-10-05 MED ORDER — HYDROCODONE-ACETAMINOPHEN 5-325 MG PO TABS: 1.0000 | ORAL_TABLET | Freq: Two times a day (BID) | ORAL | 0 refills | Status: AC

## 2015-10-05 MED ORDER — AMOXICILLIN-POT CLAVULANATE 875-125 MG PO TABS: 1.0000 | ORAL_TABLET | Freq: Two times a day (BID) | ORAL | 0 refills | Status: DC

## 2015-10-05 MED ORDER — BISOPROLOL FUMARATE 5 MG PO TABS: 2.5000 mg | ORAL_TABLET | Freq: Every day | ORAL | 5 refills | Status: AC

## 2015-10-05 NOTE — Progress Notes (Signed)
Your chest x-ray looked normal. Thanks, WS.

## 2015-10-05 NOTE — Progress Notes (Signed)
Subjective:  Patient ID: [name], female    DOB: 06-10-1940  Age: 75 y.o. MRN: [id number]  CC: Establish Care   HPI [name] presents for Follow-up of diabetes. Patient does not check blood sugar at home Patient denies symptoms such as polyuria, polydipsia, excessive hunger, nausea No significant hypoglycemic spells noted. Medications as noted Using 70/30, 30 units BID,   plus Lispro 15 BID. Taking them regularly without complication/adverse reaction being reported today.   Oxygen dependent for COPD @ 3l [place].  Using multiple inhalers, nebs QID. Dyspneic for activities requiring more than minimal exertion such as ADLs. She has documented sleep apnea, but can not tolerate the mask. Recently had MI, stents. Denies episodic chest pain, but last fall had episode of CHF. Currently controlled with ARB, BB. Apparently no diuretic, but not edematous.  Prone to pneumonia. Concerned that has had a cough for several days. Worsening. Productive of green sputum. Needs Antibiotic.  Pt. Also complains of moderately severe arthritis. Pain affects back and knees primarily. Needs help with pain relief. Has used  Hydrocodone but it seems to help no more than an aspirin. Has  llimited mobility and ambulation secondary to this.  CholesterolPatient in for follow-up of elevated cholesterol. Doing well without complaints on current medication. Denies side effects of statin including myalgia and arthralgia and nausea. Also in today for liver function testing. Currently no chest pain, shortness of breath or other cardiovascular related symptoms noted. History [name] has a past medical history of Anemia; Anxiety; Asthma; Chronic respiratory failure ([place]); COPD (chronic obstructive pulmonary disease) ([place]); Coronary atherosclerosis of native coronary artery; DDD (degenerative disc disease), lumbar; Essential hypertension, benign; GERD (gastroesophageal reflux disease); Herpes zoster (06/2013); History of bleeding  ulcers; Hyperlipidemia; Myocardial infarction ([place]) (2000); Obesity; On home oxygen therapy; OSA (obstructive sleep apnea); Osteoporosis; Takotsubo cardiomyopathy; Type 2 diabetes mellitus ([place]); and Venous insufficiency.   She has a past surgical history that includes Total abdominal hysterectomy; Shoulder open rotator cuff repair (Right); Cataract extraction w/PHACO (Left, 06/30/2012); transthoracic echocardiogram (06/2013); Alveoloplasty (10/1999); Cardiac catheterization (07/2012); Coronary angioplasty with stent; Coronary angioplasty with stent (2000; 2001; 2002); Tonsillectomy (1949); Laparoscopic cholecystectomy; Cataract extraction w/ intraocular lens implant (Right, 2015?); and left heart catheterization with coronary angiogram (N/A, 06/25/2014).   Her family history includes Coronary artery disease in her other; Heart attack in her brother and father; Stroke in her brother, mother, and sister.She reports that she quit smoking about 17 years ago. Her smoking use included Cigarettes. She has a 30.00 pack-year smoking history. She has never used smokeless tobacco. She reports that she does not drink alcohol or use drugs.  Current Outpatient Prescriptions on File Prior to Visit  Medication Sig Dispense Refill  . albuterol (PROAIR HFA) 108 (90 BASE) MCG/ACT inhaler Inhale 1-2 puffs into the lungs every 6 (six) hours as needed for wheezing or shortness of breath. 18 g 2  . albuterol (PROVENTIL) (2.5 MG/3ML) 0.083% nebulizer solution Take 3 mLs (2.5 mg total) by nebulization 4 (four) times daily. 1080 mL 3  . ALPRAZolam (XANAX) 0.5 MG tablet Take 1 tablet (0.5 mg total) by mouth 3 (three) times daily. 90 tablet 0  . aspirin 81 MG tablet Take 81 mg by mouth daily.    . budesonide (PULMICORT) 0.25 MG/2ML nebulizer solution Take 2 mLs (0.25 mg total) by nebulization 2 (two) times daily. 180 mL 11  . calcium-vitamin D (OSCAL WITH D) 500-200 MG-UNIT per tablet Take 1 tablet by mouth 2 (two) times daily.        Marland Kitchen  citalopram (CELEXA) 10 MG tablet Take 10 mg by mouth daily.    Marland Kitchen dextromethorphan-guaiFENesin (MUCINEX DM) 30-600 MG per 12 hr tablet Take 2 tablets by mouth every 12 (twelve) hours.      . Diphenhyd-Hydrocort-Nystatin (FIRST-DUKES MOUTHWASH) SUSP Gargle and swallow 1 tsp four times daily 240 mL 1  . ferrous sulfate 325 (65 FE) MG tablet Take 325 mg by mouth daily with breakfast.     . fluticasone (FLONASE) 50 MCG/ACT nasal spray Place 1 spray into both nostrils 2 times daily at 12 noon and 4 pm.    . glucose blood (ONE TOUCH ULTRA TEST) test strip TEST BLOOD SUGAR LEVELS THREE TIMES DAILY 100 each 5  . ibandronate (BONIVA) 150 MG tablet TAKE 1 TABLET EVERY MONTH ON THE SAME DAY 1 tablet 1  . insulin aspart protamine - aspart (NOVOLOG MIX 70/30 FLEXPEN) (70-30) 100 UNIT/ML FlexPen Inject 0.25 mLs (25 Units total) into the skin 2 (two) times daily with a meal. E11.8 15 mL 11  . Insulin Lispro, Human, (HUMALOG [place]) Inject into the skin.    Marland Kitchen ipratropium (ATROVENT) 0.02 % nebulizer solution Take 2.5 mLs (500 mcg total) by nebulization 4 (four) times daily. 1080 mL 11  . isosorbide mononitrate (IMDUR) 30 MG 24 hr tablet TAKE 1 TABLET DAILY 90 tablet 1  . losartan (COZAAR) 50 MG tablet TAKE ONE TABLET BY MOUTH ONCE DAILY 30 tablet 3  . Menthol, Topical Analgesic, (BENGAY PAIN RELIEF + MASSAGE) 2.5 % GEL Apply topically. Apply a small amount to affected area as needed    . metFORMIN (GLUCOPHAGE) 1000 MG tablet Take 1,000 mg by mouth 2 (two) times daily with a meal.    . Multiple Vitamin (MULTIVITAMIN) tablet Take 1 tablet by mouth daily.    . nitroGLYCERIN (NITROSTAT) 0.4 MG SL tablet Place 1 tablet (0.4 mg total) under the tongue every 5 (five) minutes as needed. May repeat x3 25 tablet 6  . ONE TOUCH LANCETS MISC Check blood sugars two times daily 100 each 6  . potassium chloride (K-DUR,KLOR-CON) 10 MEQ tablet TAKE 1 TABLET DAILY 30 tablet 3  . Probiotic Product (PROBIOTIC DAILY PO) Take 1 tablet by  mouth daily.     . simvastatin (ZOCOR) 80 MG tablet Take 80 mg by mouth daily.    . sodium chloride (OCEAN) 0.65 % SOLN nasal spray Place 1 spray into both nostrils 2 (two) times daily as needed for congestion.    Karen Chafe INSULIN SYRINGE 30G X 5/16" 0.5 ML MISC USE 3 TIMES A DAY AS DIRECTED 100 each 6  . vitamin B-12 (CYANOCOBALAMIN) 1000 MCG tablet Take 1,000 mcg by mouth daily.    . vitamin C (ASCORBIC ACID) 500 MG tablet Take 500 mg by mouth daily.    . sennosides-docusate sodium (SENOKOT-S) 8.6-50 MG tablet Take 1 tablet by mouth daily as needed for constipation.     No current facility-administered medications on file prior to visit.     ROS Review of Systems  Constitutional: Negative for activity change, appetite change and fever.  HENT: Positive for congestion. Negative for rhinorrhea and sore throat.   Eyes: Positive for discharge. Negative for visual disturbance.  Respiratory: Positive for cough and shortness of breath (increase at night).   Cardiovascular: Negative for chest pain and palpitations.  Gastrointestinal: Negative for abdominal pain, diarrhea and nausea.  Genitourinary: Negative for dysuria.  Musculoskeletal: Positive for arthralgias. Negative for myalgias.  Allergic/Immunologic: Positive for environmental allergies.    Objective:  BP  125/67 (BP Location: Left Arm, Patient Position: Sitting, Cuff Size: Normal)   Pulse 67   Temp 98.7 F (37.1 C) (Oral)   Ht 5' 2.5" (1.588 m)   Wt 220 lb (99.8 kg)   SpO2 97% Comment: 3 lpm O2  BMI 39.60 kg/m   Physical Exam  Assessment & Plan:   [name] was seen today for establish care.  Diagnoses and all orders for this visit:  Chronic hypoxemic respiratory failure ([place]) -     CBC with Differential/Platelet -     CMP14+EGFR  Chronic renal disease, stage III -     CBC with Differential/Platelet -     CMP14+EGFR  Cardiomyopathy, ischemic -     CBC with Differential/Platelet -     CMP14+EGFR -     DG Chest 2  View; Future  COPD (chronic obstructive pulmonary disease) with chronic bronchitis (HCC) -     CBC with Differential/Platelet -     CMP14+EGFR -     DG Chest 2 View; Future  Diabetes mellitus with complication (HCC) -     CBC with Differential/Platelet -     CMP14+EGFR -     Microalbumin / creatinine urine ratio -     Bayer DCA Hb A1c Waived -     Urinalysis  Diabetic polyneuropathy associated with type 2 diabetes mellitus (HCC) -     CBC with Differential/Platelet -     CMP14+EGFR -     TSH + free T4  Hyperlipidemia -     CBC with Differential/Platelet -     CMP14+EGFR -     Lipid panel -     TSH + free T4  Hypoxemic respiratory failure, chronic (HCC) -     CBC with Differential/Platelet -     CMP14+EGFR -     DG Chest 2 View; Future -     TSH + free T4  Leukocytosis -     CBC with Differential/Platelet -     CMP14+EGFR  NSTEMI (non-ST elevated myocardial infarction) (HCC) -     CBC with Differential/Platelet -     CMP14+EGFR  Obesity -     CBC with Differential/Platelet -     CMP14+EGFR  OSA (obstructive sleep apnea) -     CBC with Differential/Platelet -     CMP14+EGFR  Primary osteoarthritis involving multiple joints -     CBC with Differential/Platelet -     CMP14+EGFR  Other orders -     HYDROcodone-acetaminophen (NORCO/VICODIN) 5-325 MG tablet; Take 1 tablet by mouth 2 (two) times daily. -     amoxicillin-clavulanate (AUGMENTIN) 875-125 MG tablet; Take 1 tablet by mouth 2 (two) times daily. Take all of this medication -     bisoprolol (ZEBETA) 5 MG tablet; Take 0.5 tablets (2.5 mg total) by mouth daily. -     torsemide (DEMADEX) 20 MG tablet; Take 1 tablet (20 mg total) by mouth daily. -     omeprazole (PRILOSEC) 20 MG capsule; TAKE ONE CAPSULE BY MOUTH ONCE DAILY 30 MINUTES BEFORE A MEAL   I have discontinued Ms. Weissinger's amoxicillin-clavulanate. I have also changed her HYDROcodone-acetaminophen and torsemide. Additionally, I am having her start on  amoxicillin-clavulanate. Lastly, I am having her maintain her calcium-vitamin D, dextromethorphan-guaiFENesin, ferrous sulfate, Probiotic Product (PROBIOTIC DAILY PO), aspirin, nitroGLYCERIN, ONE TOUCH LANCETS, vitamin C, glucose blood, TRUEPLUS INSULIN SYRINGE, ibandronate, losartan, isosorbide mononitrate, sodium chloride, insulin aspart protamine - aspart, ALPRAZolam, (Insulin Lispro, Human, (HUMALOG [place])), multivitamin, potassium chloride, simvastatin, albuterol,  albuterol, vitamin B-12, fluticasone, metFORMIN, citalopram, Menthol (Topical Analgesic), sennosides-docusate sodium, budesonide, ipratropium, FIRST-DUKES MOUTHWASH, bisoprolol, and omeprazole.  Meds ordered this encounter  Medications  . HYDROcodone-acetaminophen (NORCO/VICODIN) 5-325 MG tablet    Sig: Take 1 tablet by mouth 2 (two) times daily.    Dispense:  60 tablet    Refill:  0  . amoxicillin-clavulanate (AUGMENTIN) 875-125 MG tablet    Sig: Take 1 tablet by mouth 2 (two) times daily. Take all of this medication    Dispense:  20 tablet    Refill:  0  . bisoprolol (ZEBETA) 5 MG tablet    Sig: Take 0.5 tablets (2.5 mg total) by mouth daily.    Dispense:  45 tablet    Refill:  5  . torsemide (DEMADEX) 20 MG tablet    Sig: Take 1 tablet (20 mg total) by mouth daily.    Dispense:  30 tablet    Refill:  5  . omeprazole (PRILOSEC) 20 MG capsule    Sig: TAKE ONE CAPSULE BY MOUTH ONCE DAILY 30 MINUTES BEFORE A MEAL    Dispense:  30 capsule    Refill:  11   Chest x-ray performed shows evidence of severe COPD but no acute changes of pneumonia.  Follow-up: Return in about 4 weeks (around 11/02/2015) for diabetes, Pain, COPD, Arthritis.  Claretta Fraise, M.D.

## 2015-10-06 LAB — CBC WITH DIFFERENTIAL/PLATELET
BASOS ABS: 0 10*3/uL (ref 0.0–0.2)
Basos: 0 %
EOS (ABSOLUTE): 0.3 10*3/uL (ref 0.0–0.4)
Eos: 3 %
HEMOGLOBIN: 10.8 g/dL — AB (ref 11.1–15.9)
Hematocrit: 32.8 % — ABNORMAL LOW (ref 34.0–46.6)
Immature Grans (Abs): 0 10*3/uL (ref 0.0–0.1)
Immature Granulocytes: 0 %
LYMPHS ABS: 2.3 10*3/uL (ref 0.7–3.1)
Lymphs: 24 %
MCH: 29.3 pg (ref 26.6–33.0)
MCHC: 32.9 g/dL (ref 31.5–35.7)
MCV: 89 fL (ref 79–97)
MONOCYTES: 7 %
Monocytes Absolute: 0.7 10*3/uL (ref 0.1–0.9)
NEUTROS ABS: 6.1 10*3/uL (ref 1.4–7.0)
Neutrophils: 66 %
Platelets: 224 10*3/uL (ref 150–379)
RBC: 3.68 x10E6/uL — ABNORMAL LOW (ref 3.77–5.28)
RDW: 13.2 % (ref 12.3–15.4)
WBC: 9.3 10*3/uL (ref 3.4–10.8)

## 2015-10-06 LAB — LIPID PANEL
CHOLESTEROL TOTAL: 135 mg/dL (ref 100–199)
Chol/HDL Ratio: 4.1 ratio units (ref 0.0–4.4)
HDL: 33 mg/dL — ABNORMAL LOW (ref >39)
LDL Calculated: 46 mg/dL (ref 0–99)
Triglycerides: 282 mg/dL — ABNORMAL HIGH (ref 0–149)
VLDL CHOLESTEROL CAL: 56 mg/dL — AB (ref 5–40)

## 2015-10-06 LAB — CMP14+EGFR
ALBUMIN: 4 g/dL (ref 3.5–4.8)
ALK PHOS: 68 IU/L (ref 39–117)
ALT: 18 IU/L (ref 0–32)
AST: 21 IU/L (ref 0–40)
Albumin/Globulin Ratio: 1.7 (ref 1.2–2.2)
BUN/Creatinine Ratio: 21 (ref 12–28)
BUN: 25 mg/dL (ref 8–27)
CHLORIDE: 98 mmol/L (ref 96–106)
CO2: 31 mmol/L — ABNORMAL HIGH (ref 18–29)
Calcium: 9.2 mg/dL (ref 8.7–10.3)
Creatinine, Ser: 1.21 mg/dL — ABNORMAL HIGH (ref 0.57–1.00)
GFR calc Af Amer: 51 mL/min/{1.73_m2} — ABNORMAL LOW (ref >59)
GFR calc non Af Amer: 44 mL/min/{1.73_m2} — ABNORMAL LOW (ref >59)
GLUCOSE: 84 mg/dL (ref 65–99)
Globulin, Total: 2.3 g/dL (ref 1.5–4.5)
Potassium: 4.2 mmol/L (ref 3.5–5.2)
SODIUM: 144 mmol/L (ref 134–144)
Total Protein: 6.3 g/dL (ref 6.0–8.5)

## 2015-10-06 LAB — MICROALBUMIN / CREATININE URINE RATIO
CREATININE, UR: 35.4 mg/dL
MICROALB/CREAT RATIO: 9.3 mg/g{creat} (ref 0.0–30.0)
MICROALBUM., U, RANDOM: 3.3 ug/mL

## 2015-10-06 LAB — TSH+FREE T4
FREE T4: 1.19 ng/dL (ref 0.82–1.77)
TSH: 1.82 u[IU]/mL (ref 0.450–4.500)

## 2015-10-07 DIAGNOSIS — Z029 Encounter for administrative examinations, unspecified: Secondary | ICD-10-CM

## 2015-10-11 ENCOUNTER — Emergency Department (HOSPITAL_COMMUNITY): Payer: Medicare HMO

## 2015-10-11 ENCOUNTER — Inpatient Hospital Stay (HOSPITAL_COMMUNITY)
Admission: EM | Admit: 2015-10-11 | Discharge: 2015-10-16 | DRG: 064 | Disposition: A | Discharge status: Home / Self Care | Payer: Medicare HMO | Attending: Internal Medicine | Admitting: Internal Medicine

## 2015-10-11 ENCOUNTER — Encounter (HOSPITAL_COMMUNITY): Payer: Self-pay | Admitting: Emergency Medicine

## 2015-10-11 DIAGNOSIS — E1165 Type 2 diabetes mellitus with hyperglycemia: Secondary | ICD-10-CM | POA: Diagnosis present

## 2015-10-11 DIAGNOSIS — Z9049 Acquired absence of other specified parts of digestive tract: Secondary | ICD-10-CM

## 2015-10-11 DIAGNOSIS — R7989 Other specified abnormal findings of blood chemistry: Secondary | ICD-10-CM | POA: Diagnosis not present

## 2015-10-11 DIAGNOSIS — Z7982 Long term (current) use of aspirin: Secondary | ICD-10-CM

## 2015-10-11 DIAGNOSIS — G8194 Hemiplegia, unspecified affecting left nondominant side: Secondary | ICD-10-CM | POA: Diagnosis present

## 2015-10-11 DIAGNOSIS — Z79899 Other long term (current) drug therapy: Secondary | ICD-10-CM | POA: Diagnosis not present

## 2015-10-11 DIAGNOSIS — Z7951 Long term (current) use of inhaled steroids: Secondary | ICD-10-CM

## 2015-10-11 DIAGNOSIS — I214 Non-ST elevation (NSTEMI) myocardial infarction: Secondary | ICD-10-CM | POA: Diagnosis present

## 2015-10-11 DIAGNOSIS — I2510 Atherosclerotic heart disease of native coronary artery without angina pectoris: Secondary | ICD-10-CM

## 2015-10-11 DIAGNOSIS — R778 Other specified abnormalities of plasma proteins: Secondary | ICD-10-CM

## 2015-10-11 DIAGNOSIS — J449 Chronic obstructive pulmonary disease, unspecified: Secondary | ICD-10-CM | POA: Diagnosis present

## 2015-10-11 DIAGNOSIS — Z6838 Body mass index (BMI) 38.0-38.9, adult: Secondary | ICD-10-CM

## 2015-10-11 DIAGNOSIS — Z9861 Coronary angioplasty status: Secondary | ICD-10-CM | POA: Diagnosis not present

## 2015-10-11 DIAGNOSIS — E1121 Type 2 diabetes mellitus with diabetic nephropathy: Secondary | ICD-10-CM | POA: Diagnosis not present

## 2015-10-11 DIAGNOSIS — Z794 Long term (current) use of insulin: Secondary | ICD-10-CM

## 2015-10-11 DIAGNOSIS — Z90710 Acquired absence of both cervix and uterus: Secondary | ICD-10-CM

## 2015-10-11 DIAGNOSIS — F329 Major depressive disorder, single episode, unspecified: Secondary | ICD-10-CM | POA: Diagnosis present

## 2015-10-11 DIAGNOSIS — Z9981 Dependence on supplemental oxygen: Secondary | ICD-10-CM | POA: Diagnosis not present

## 2015-10-11 DIAGNOSIS — Z955 Presence of coronary angioplasty implant and graft: Secondary | ICD-10-CM

## 2015-10-11 DIAGNOSIS — N183 Chronic kidney disease, stage 3 unspecified: Secondary | ICD-10-CM | POA: Diagnosis present

## 2015-10-11 DIAGNOSIS — I129 Hypertensive chronic kidney disease with stage 1 through stage 4 chronic kidney disease, or unspecified chronic kidney disease: Secondary | ICD-10-CM | POA: Diagnosis present

## 2015-10-11 DIAGNOSIS — I639 Cerebral infarction, unspecified: Secondary | ICD-10-CM | POA: Insufficient documentation

## 2015-10-11 DIAGNOSIS — I252 Old myocardial infarction: Secondary | ICD-10-CM | POA: Diagnosis not present

## 2015-10-11 DIAGNOSIS — I10 Essential (primary) hypertension: Secondary | ICD-10-CM | POA: Diagnosis not present

## 2015-10-11 DIAGNOSIS — G4733 Obstructive sleep apnea (adult) (pediatric): Secondary | ICD-10-CM | POA: Diagnosis present

## 2015-10-11 DIAGNOSIS — I6340 Cerebral infarction due to embolism of unspecified cerebral artery: Secondary | ICD-10-CM | POA: Diagnosis present

## 2015-10-11 DIAGNOSIS — I611 Nontraumatic intracerebral hemorrhage in hemisphere, cortical: Secondary | ICD-10-CM | POA: Diagnosis present

## 2015-10-11 DIAGNOSIS — E1122 Type 2 diabetes mellitus with diabetic chronic kidney disease: Secondary | ICD-10-CM | POA: Diagnosis present

## 2015-10-11 DIAGNOSIS — K219 Gastro-esophageal reflux disease without esophagitis: Secondary | ICD-10-CM | POA: Diagnosis present

## 2015-10-11 DIAGNOSIS — M810 Age-related osteoporosis without current pathological fracture: Secondary | ICD-10-CM | POA: Diagnosis present

## 2015-10-11 DIAGNOSIS — E785 Hyperlipidemia, unspecified: Secondary | ICD-10-CM | POA: Diagnosis present

## 2015-10-11 DIAGNOSIS — I480 Paroxysmal atrial fibrillation: Secondary | ICD-10-CM | POA: Diagnosis not present

## 2015-10-11 DIAGNOSIS — I779 Disorder of arteries and arterioles, unspecified: Secondary | ICD-10-CM

## 2015-10-11 DIAGNOSIS — J9611 Chronic respiratory failure with hypoxia: Secondary | ICD-10-CM | POA: Diagnosis present

## 2015-10-11 DIAGNOSIS — Z91011 Allergy to milk products: Secondary | ICD-10-CM

## 2015-10-11 DIAGNOSIS — I5181 Takotsubo syndrome: Secondary | ICD-10-CM | POA: Diagnosis present

## 2015-10-11 DIAGNOSIS — G459 Transient cerebral ischemic attack, unspecified: Secondary | ICD-10-CM | POA: Diagnosis not present

## 2015-10-11 DIAGNOSIS — Z9841 Cataract extraction status, right eye: Secondary | ICD-10-CM

## 2015-10-11 DIAGNOSIS — Z87891 Personal history of nicotine dependence: Secondary | ICD-10-CM

## 2015-10-11 DIAGNOSIS — Z8249 Family history of ischemic heart disease and other diseases of the circulatory system: Secondary | ICD-10-CM

## 2015-10-11 DIAGNOSIS — F419 Anxiety disorder, unspecified: Secondary | ICD-10-CM | POA: Diagnosis present

## 2015-10-11 DIAGNOSIS — I739 Peripheral vascular disease, unspecified: Secondary | ICD-10-CM

## 2015-10-11 DIAGNOSIS — Z888 Allergy status to other drugs, medicaments and biological substances status: Secondary | ICD-10-CM

## 2015-10-11 DIAGNOSIS — Z961 Presence of intraocular lens: Secondary | ICD-10-CM | POA: Diagnosis present

## 2015-10-11 DIAGNOSIS — R531 Weakness: Secondary | ICD-10-CM | POA: Diagnosis present

## 2015-10-11 DIAGNOSIS — Z9842 Cataract extraction status, left eye: Secondary | ICD-10-CM

## 2015-10-11 DIAGNOSIS — D649 Anemia, unspecified: Secondary | ICD-10-CM | POA: Diagnosis present

## 2015-10-11 HISTORY — DX: Peripheral vascular disease, unspecified: I73.9

## 2015-10-11 LAB — DIFFERENTIAL
BASOS ABS: 0 10*3/uL (ref 0.0–0.1)
BASOS PCT: 0 %
Eosinophils Absolute: 0 10*3/uL (ref 0.0–0.7)
Eosinophils Relative: 0 %
Lymphocytes Relative: 7 %
Lymphs Abs: 1 10*3/uL (ref 0.7–4.0)
Monocytes Absolute: 1.1 10*3/uL — ABNORMAL HIGH (ref 0.1–1.0)
Monocytes Relative: 8 %
NEUTROS ABS: 11.6 10*3/uL — AB (ref 1.7–7.7)
Neutrophils Relative %: 85 %

## 2015-10-11 LAB — URINALYSIS, ROUTINE W REFLEX MICROSCOPIC
Bilirubin Urine: NEGATIVE
Leukocytes, UA: NEGATIVE
Nitrite: NEGATIVE
PH: 5.5 (ref 5.0–8.0)
PROTEIN: NEGATIVE mg/dL
Specific Gravity, Urine: 1.02 (ref 1.005–1.030)

## 2015-10-11 LAB — COMPREHENSIVE METABOLIC PANEL
ALT: 35 U/L (ref 14–54)
AST: 69 U/L — AB (ref 15–41)
Albumin: 3.8 g/dL (ref 3.5–5.0)
Alkaline Phosphatase: 63 U/L (ref 38–126)
Anion gap: 8 (ref 5–15)
BUN: 22 mg/dL — AB (ref 6–20)
CHLORIDE: 97 mmol/L — AB (ref 101–111)
CO2: 32 mmol/L (ref 22–32)
CREATININE: 1.52 mg/dL — AB (ref 0.44–1.00)
Calcium: 9.3 mg/dL (ref 8.9–10.3)
GFR calc Af Amer: 38 mL/min — ABNORMAL LOW (ref >60)
GFR calc non Af Amer: 32 mL/min — ABNORMAL LOW (ref >60)
Glucose, Bld: 270 mg/dL — ABNORMAL HIGH (ref 65–99)
Potassium: 4.4 mmol/L (ref 3.5–5.1)
SODIUM: 137 mmol/L (ref 135–145)
Total Bilirubin: 0.4 mg/dL (ref 0.3–1.2)
Total Protein: 6.8 g/dL (ref 6.5–8.1)

## 2015-10-11 LAB — I-STAT CHEM 8, ED
BUN: 21 mg/dL — ABNORMAL HIGH (ref 6–20)
CHLORIDE: 94 mmol/L — AB (ref 101–111)
Calcium, Ion: 1.23 mmol/L (ref 1.12–1.23)
Creatinine, Ser: 1.4 mg/dL — ABNORMAL HIGH (ref 0.44–1.00)
GLUCOSE: 259 mg/dL — AB (ref 65–99)
HCT: 35 % — ABNORMAL LOW (ref 36.0–46.0)
Hemoglobin: 11.9 g/dL — ABNORMAL LOW (ref 12.0–15.0)
POTASSIUM: 4.3 mmol/L (ref 3.5–5.1)
SODIUM: 140 mmol/L (ref 135–145)
TCO2: 33 mmol/L (ref 0–100)

## 2015-10-11 LAB — CBC
HCT: 36 % (ref 36.0–46.0)
Hemoglobin: 11.8 g/dL — ABNORMAL LOW (ref 12.0–15.0)
MCH: 30.8 pg (ref 26.0–34.0)
MCHC: 32.8 g/dL (ref 30.0–36.0)
MCV: 94 fL (ref 78.0–100.0)
PLATELETS: 208 10*3/uL (ref 150–400)
RBC: 3.83 MIL/uL — ABNORMAL LOW (ref 3.87–5.11)
RDW: 12.1 % (ref 11.5–15.5)
WBC: 13.7 10*3/uL — AB (ref 4.0–10.5)

## 2015-10-11 LAB — ETHANOL

## 2015-10-11 LAB — PROTIME-INR
INR: 0.99
PROTHROMBIN TIME: 13 s (ref 11.4–15.2)

## 2015-10-11 LAB — I-STAT TROPONIN, ED: Troponin i, poc: 7.47 ng/mL (ref 0.00–0.08)

## 2015-10-11 LAB — RAPID URINE DRUG SCREEN, HOSP PERFORMED
Amphetamines: NOT DETECTED
BARBITURATES: NOT DETECTED
BENZODIAZEPINES: POSITIVE — AB
COCAINE: NOT DETECTED
Opiates: POSITIVE — AB
Tetrahydrocannabinol: NOT DETECTED

## 2015-10-11 LAB — URINE MICROSCOPIC-ADD ON

## 2015-10-11 LAB — CBG MONITORING, ED: Glucose-Capillary: 246 mg/dL — ABNORMAL HIGH (ref 65–99)

## 2015-10-11 LAB — TROPONIN I: Troponin I: 9.95 ng/mL (ref <0.03)

## 2015-10-11 LAB — APTT: APTT: 24 s (ref 24–36)

## 2015-10-11 MED ORDER — BUDESONIDE 0.25 MG/2ML IN SUSP
0.2500 mg | Freq: Two times a day (BID) | RESPIRATORY_TRACT | Status: DC
  Administered 2015-10-11 – 2015-10-16 (×9): 0.25 mg via RESPIRATORY_TRACT
  Filled 2015-10-11 (×9): qty 2

## 2015-10-11 MED ORDER — NITROGLYCERIN 0.4 MG SL SUBL: 0.4000 mg | SUBLINGUAL_TABLET | SUBLINGUAL | Status: DC | PRN

## 2015-10-11 MED ORDER — ENOXAPARIN SODIUM 40 MG/0.4ML ~~LOC~~ SOLN
40.0000 mg | SUBCUTANEOUS | Status: DC
  Administered 2015-10-11 – 2015-10-13 (×3): 40 mg via SUBCUTANEOUS
  Filled 2015-10-11 (×3): qty 0.4

## 2015-10-11 MED ORDER — HYDROCODONE-ACETAMINOPHEN 5-325 MG PO TABS
1.0000 | ORAL_TABLET | Freq: Two times a day (BID) | ORAL | Status: DC
  Administered 2015-10-11 – 2015-10-16 (×10): 1 via ORAL
  Filled 2015-10-11 (×10): qty 1

## 2015-10-11 MED ORDER — IPRATROPIUM BROMIDE 0.02 % IN SOLN: 500.0000 ug | Freq: Four times a day (QID) | RESPIRATORY_TRACT | Status: DC

## 2015-10-11 MED ORDER — ADULT MULTIVITAMIN W/MINERALS CH
1.0000 | ORAL_TABLET | Freq: Every day | ORAL | Status: DC
  Administered 2015-10-12 – 2015-10-16 (×5): 1 via ORAL
  Filled 2015-10-11 (×5): qty 1

## 2015-10-11 MED ORDER — FLUTICASONE PROPIONATE 50 MCG/ACT NA SUSP
1.0000 | Freq: Two times a day (BID) | NASAL | Status: DC
  Administered 2015-10-11 – 2015-10-16 (×9): 1 via NASAL
  Filled 2015-10-11 (×3): qty 16

## 2015-10-11 MED ORDER — ALPRAZOLAM 0.5 MG PO TABS
0.5000 mg | ORAL_TABLET | Freq: Three times a day (TID) | ORAL | Status: DC
  Administered 2015-10-11 – 2015-10-16 (×14): 0.5 mg via ORAL
  Filled 2015-10-11 (×14): qty 1

## 2015-10-11 MED ORDER — ASPIRIN 325 MG PO TABS
325.0000 mg | ORAL_TABLET | Freq: Once | ORAL | Status: AC
  Administered 2015-10-11: 325 mg via ORAL
  Filled 2015-10-11: qty 1

## 2015-10-11 MED ORDER — INSULIN ASPART PROT & ASPART (70-30 MIX) 100 UNIT/ML ~~LOC~~ SUSP
30.0000 [IU] | Freq: Two times a day (BID) | SUBCUTANEOUS | Status: DC
  Administered 2015-10-12 – 2015-10-16 (×8): 30 [IU] via SUBCUTANEOUS
  Filled 2015-10-11 (×2): qty 10

## 2015-10-11 MED ORDER — VITAMIN B-12 1000 MCG PO TABS
1000.0000 ug | ORAL_TABLET | Freq: Every day | ORAL | Status: DC
  Administered 2015-10-12 – 2015-10-16 (×5): 1000 ug via ORAL
  Filled 2015-10-11 (×5): qty 1

## 2015-10-11 MED ORDER — ISOSORBIDE MONONITRATE ER 30 MG PO TB24
30.0000 mg | ORAL_TABLET | Freq: Every day | ORAL | Status: DC
  Administered 2015-10-12 – 2015-10-16 (×5): 30 mg via ORAL
  Filled 2015-10-11 (×5): qty 1

## 2015-10-11 MED ORDER — INSULIN ASPART 100 UNIT/ML ~~LOC~~ SOLN
0.0000 [IU] | Freq: Every day | SUBCUTANEOUS | Status: DC
  Administered 2015-10-11 – 2015-10-13 (×2): 2 [IU] via SUBCUTANEOUS
  Administered 2015-10-14: 3 [IU] via SUBCUTANEOUS
  Administered 2015-10-15: 2 [IU] via SUBCUTANEOUS

## 2015-10-11 MED ORDER — BISOPROLOL FUMARATE 5 MG PO TABS
2.5000 mg | ORAL_TABLET | Freq: Every day | ORAL | Status: DC
  Administered 2015-10-12 – 2015-10-16 (×5): 2.5 mg via ORAL
  Filled 2015-10-11 (×2): qty 1
  Filled 2015-10-11 (×2): qty 0.5
  Filled 2015-10-11: qty 1
  Filled 2015-10-11: qty 0.5

## 2015-10-11 MED ORDER — PANTOPRAZOLE SODIUM 40 MG PO TBEC
40.0000 mg | DELAYED_RELEASE_TABLET | Freq: Every day | ORAL | Status: DC
  Administered 2015-10-12 – 2015-10-16 (×5): 40 mg via ORAL
  Filled 2015-10-11 (×5): qty 1

## 2015-10-11 MED ORDER — FERROUS SULFATE 325 (65 FE) MG PO TABS
325.0000 mg | ORAL_TABLET | Freq: Every day | ORAL | Status: DC
  Administered 2015-10-12 – 2015-10-16 (×5): 325 mg via ORAL
  Filled 2015-10-11 (×5): qty 1

## 2015-10-11 MED ORDER — RISAQUAD PO CAPS
1.0000 | ORAL_CAPSULE | Freq: Every day | ORAL | Status: DC
  Administered 2015-10-12 – 2015-10-16 (×5): 1 via ORAL
  Filled 2015-10-11 (×5): qty 1

## 2015-10-11 MED ORDER — CITALOPRAM HYDROBROMIDE 10 MG PO TABS
10.0000 mg | ORAL_TABLET | Freq: Every day | ORAL | Status: DC
  Administered 2015-10-12 – 2015-10-16 (×5): 10 mg via ORAL
  Filled 2015-10-11 (×5): qty 1

## 2015-10-11 MED ORDER — STROKE: EARLY STAGES OF RECOVERY BOOK
Freq: Once | Status: AC
  Administered 2015-10-12: 08:00:00
  Filled 2015-10-11: qty 1

## 2015-10-11 MED ORDER — ALBUTEROL SULFATE (2.5 MG/3ML) 0.083% IN NEBU: 2.5000 mg | INHALATION_SOLUTION | Freq: Four times a day (QID) | RESPIRATORY_TRACT | Status: DC

## 2015-10-11 MED ORDER — ATORVASTATIN CALCIUM 40 MG PO TABS
40.0000 mg | ORAL_TABLET | Freq: Every day | ORAL | Status: DC
  Administered 2015-10-11 – 2015-10-15 (×5): 40 mg via ORAL
  Filled 2015-10-11 (×5): qty 1

## 2015-10-11 MED ORDER — ALBUTEROL SULFATE (2.5 MG/3ML) 0.083% IN NEBU
INHALATION_SOLUTION | RESPIRATORY_TRACT | Status: AC
  Filled 2015-10-11: qty 3

## 2015-10-11 MED ORDER — SALINE SPRAY 0.65 % NA SOLN
1.0000 | Freq: Two times a day (BID) | NASAL | Status: DC | PRN
Start: 2015-10-11 — End: 2015-10-16

## 2015-10-11 MED ORDER — MAGIC MOUTHWASH
5.0000 mL | Freq: Four times a day (QID) | ORAL | Status: DC
  Administered 2015-10-11 – 2015-10-16 (×14): 5 mL via ORAL
  Filled 2015-10-11 (×18): qty 5

## 2015-10-11 MED ORDER — CALCIUM CARBONATE-VITAMIN D 500-200 MG-UNIT PO TABS
1.0000 | ORAL_TABLET | Freq: Two times a day (BID) | ORAL | Status: DC
  Administered 2015-10-11 – 2015-10-16 (×10): 1 via ORAL
  Filled 2015-10-11 (×10): qty 1

## 2015-10-11 MED ORDER — SENNOSIDES-DOCUSATE SODIUM 8.6-50 MG PO TABS: 1.0000 | ORAL_TABLET | Freq: Every day | ORAL | Status: DC | PRN

## 2015-10-11 MED ORDER — TORSEMIDE 20 MG PO TABS
20.0000 mg | ORAL_TABLET | Freq: Every day | ORAL | Status: DC
  Administered 2015-10-12 – 2015-10-13 (×2): 20 mg via ORAL
  Filled 2015-10-11 (×2): qty 1

## 2015-10-11 MED ORDER — ASPIRIN 325 MG PO TABS
325.0000 mg | ORAL_TABLET | Freq: Every day | ORAL | Status: DC
  Administered 2015-10-12: 325 mg via ORAL
  Filled 2015-10-11: qty 1

## 2015-10-11 MED ORDER — INSULIN ASPART 100 UNIT/ML ~~LOC~~ SOLN
0.0000 [IU] | Freq: Three times a day (TID) | SUBCUTANEOUS | Status: DC
  Administered 2015-10-12: 5 [IU] via SUBCUTANEOUS
  Administered 2015-10-12: 3 [IU] via SUBCUTANEOUS
  Administered 2015-10-12: 5 [IU] via SUBCUTANEOUS
  Administered 2015-10-13: 2 [IU] via SUBCUTANEOUS
  Administered 2015-10-13: 5 [IU] via SUBCUTANEOUS
  Administered 2015-10-13 – 2015-10-14 (×2): 3 [IU] via SUBCUTANEOUS
  Administered 2015-10-14: 7 [IU] via SUBCUTANEOUS
  Administered 2015-10-15: 5 [IU] via SUBCUTANEOUS
  Administered 2015-10-15: 1 [IU] via SUBCUTANEOUS
  Administered 2015-10-15: 3 [IU] via SUBCUTANEOUS
  Administered 2015-10-16: 2 [IU] via SUBCUTANEOUS

## 2015-10-11 MED ORDER — LOSARTAN POTASSIUM 50 MG PO TABS
50.0000 mg | ORAL_TABLET | Freq: Every day | ORAL | Status: DC
  Administered 2015-10-12 – 2015-10-13 (×2): 50 mg via ORAL
  Filled 2015-10-11 (×2): qty 1

## 2015-10-11 MED ORDER — IPRATROPIUM-ALBUTEROL 0.5-2.5 (3) MG/3ML IN SOLN
3.0000 mL | Freq: Four times a day (QID) | RESPIRATORY_TRACT | Status: DC
  Administered 2015-10-11 – 2015-10-12 (×3): 3 mL via RESPIRATORY_TRACT
  Filled 2015-10-11 (×3): qty 3

## 2015-10-11 MED ORDER — FIRST-DUKES MOUTHWASH MT SUSP: 5.0000 mL | Freq: Four times a day (QID) | OROMUCOSAL | Status: DC

## 2015-10-11 NOTE — H&P (Signed)
History and Physical  [name]:[id number] DOB: 11/21/1940 DOA: 10/11/2015  Referring physician: ER Physician PCP: Margaret Deiters, MD  Outpatient Specialists:    Patient coming from: Home  Chief Complaint: Left Upper extremity weakness  HPI: 75 year old female with history of 2 previous CVA's, DM, HTN, HL, CKD 3 and morbid obesity. Patient presents with left upper extremity weakness that started about 5 hours. The patient was deemed to be outside of the window for TPA at presentation. No headache, no SOB, no fever or chills, no chest pain and no GI or urinary symptoms. CT scan has not revealed any acute findings. Troponin is greater than 9 but patient has no chest pain, SOB or diaphoresis.  ED Course: CT Scan head. ER Physician discussed with Cardiology and Neurology  Pertinent labs: Troponin is 9.95. CT head has not revealed any acute findinigs.  Review of Systems:  As in HPI. Negative for fever, visual changes, sore throat, rash, new muscle aches, chest pain, SOB, dysuria, bleeding, n/v/abdominal pain.  Past Medical History:  Diagnosis Date  . Anemia   . Anxiety   . Asthma   . Chronic respiratory failure (HCC)   . COPD (chronic obstructive pulmonary disease) (HCC)   . Coronary atherosclerosis of native coronary artery    a. history of MI with PTCA/stent to LAD 07/1998. b. Repeat PTCA to this lesion in 02/1999. c. PTCA/brachytherapy to mLAD in 2001  . DDD (degenerative disc disease), lumbar   . Essential hypertension, benign   . GERD (gastroesophageal reflux disease)   . Herpes zoster 06/2013  . History of bleeding ulcers   . Hyperlipidemia   . Myocardial infarction (HCC) 2000  . Obesity   . On home oxygen therapy   . OSA (obstructive sleep apnea)   . Osteoporosis   . Takotsubo cardiomyopathy    April 2016  . Type 2 diabetes mellitus (HCC)   . Venous insufficiency     Past Surgical History:  Procedure Laterality Date  . ALVEOLOPLASTY  10/1999   [name] 07/19/2010  Surgical extraction of root segments #21, 22 and 27 with alveoloplasty.  Marland Kitchen CARDIAC CATHETERIZATION  07/2012   + progressive CAD but no lesions to intervene upon, EF 65%; med mgmt continued  . CATARACT EXTRACTION W/ INTRAOCULAR LENS IMPLANT Right 2015?  Marland Kitchen CATARACT EXTRACTION W/PHACO Left 06/30/2012   Procedure: CATARACT EXTRACTION PHACO AND INTRAOCULAR LENS PLACEMENT (IOC);  Surgeon: Margaret Payor, MD;  Location: [facility];  Service: Ophthalmology;  Laterality: Left;  CDE:12.32  . CORONARY ANGIOPLASTY WITH STENT PLACEMENT     PTCA/BMS LAD 2000, PTCA/brachytherapy LAD 2001, LVEF 55%   . CORONARY ANGIOPLASTY WITH STENT PLACEMENT  2000; 2001; 2002   [name] 05/18/2010  . LAPAROSCOPIC CHOLECYSTECTOMY    . LEFT HEART CATHETERIZATION WITH CORONARY ANGIOGRAM N/A 06/25/2014   Procedure: LEFT HEART CATHETERIZATION WITH CORONARY ANGIOGRAM;  Surgeon: Margaret Records, MD;  Location: [facility] CATH LAB;  Service: Cardiovascular;  Laterality: N/A;  . SHOULDER OPEN ROTATOR CUFF REPAIR Right   . TONSILLECTOMY  1949  . TOTAL ABDOMINAL HYSTERECTOMY     DUB (non-malignant reasons).  No hx of abnormal pap smears.  . TRANSTHORACIC ECHOCARDIOGRAM  06/2013   EF 40%, + WMA     reports that she quit smoking about 17 years ago. Her smoking use included Cigarettes. She has a 30.00 pack-year smoking history. She has never used smokeless tobacco. She reports that she does not drink alcohol or use drugs.  Allergies  Allergen Reactions  .  Codeine Nausea And Vomiting    vomiting  . Terbutaline Sulfate     REACTION: INTOL to terbutaline    Family History  Problem Relation Age of Onset  . Stroke Mother   . Heart attack Father   . Coronary artery disease Other   . Stroke Sister   . Stroke Brother   . Heart attack Brother      Prior to Admission medications   Medication Sig Start Date End Date Taking? Authorizing Provider  albuterol (PROAIR HFA) 108 (90 BASE) MCG/ACT inhaler Inhale 1-2 puffs into the lungs every 6 (six) hours as  needed for wheezing or shortness of breath. 01/26/15  Yes Margaret Mcalpine, MD  albuterol (PROVENTIL) (2.5 MG/3ML) 0.083% nebulizer solution Take 3 mLs (2.5 mg total) by nebulization 4 (four) times daily. 03/18/15  Yes Margaret Mcalpine, MD  ALPRAZolam Prudy Feeler) 0.5 MG tablet Take 1 tablet (0.5 mg total) by mouth 3 (three) times daily. 06/29/14  Yes Margaret Fickle, MD  amoxicillin-clavulanate (AUGMENTIN) 875-125 MG tablet Take 1 tablet by mouth 2 (two) times daily. Take all of this medication 10/05/15  Yes Margaret Claude, MD  aspirin 81 MG tablet Take 81 mg by mouth daily.   Yes Historical Provider, MD  bisoprolol (ZEBETA) 5 MG tablet Take 0.5 tablets (2.5 mg total) by mouth daily. 10/05/15  Yes Margaret Claude, MD  budesonide (PULMICORT) 0.25 MG/2ML nebulizer solution Take 2 mLs (0.25 mg total) by nebulization 2 (two) times daily. 05/02/15  Yes Margaret Mcalpine, MD  calcium-vitamin D (OSCAL WITH D) 500-200 MG-UNIT per tablet Take 1 tablet by mouth 2 (two) times daily.     Yes Historical Provider, MD  citalopram (CELEXA) 10 MG tablet Take 10 mg by mouth daily.   Yes Historical Provider, MD  dextromethorphan-guaiFENesin (MUCINEX DM) 30-600 MG per 12 hr tablet Take 2 tablets by mouth every 12 (twelve) hours.     Yes Historical Provider, MD  Diphenhyd-Hydrocort-Nystatin (FIRST-DUKES MOUTHWASH) SUSP Gargle and swallow 1 tsp four times daily Patient taking differently: Take 5 mLs by mouth 4 (four) times daily. Gargle and swallow 1 tsp four times daily 05/12/15  Yes Margaret Mcalpine, MD  ferrous sulfate 325 (65 FE) MG tablet Take 325 mg by mouth daily with breakfast.    Yes Historical Provider, MD  fluticasone (FLONASE) 50 MCG/ACT nasal spray Place 1 spray into both nostrils 2 times daily at 12 noon and 4 pm.   Yes Historical Provider, MD  HYDROcodone-acetaminophen (NORCO/VICODIN) 5-325 MG tablet Take 1 tablet by mouth 2 (two) times daily. 10/05/15  Yes Margaret Claude, MD  ibandronate (BONIVA) 150 MG tablet TAKE 1 TABLET EVERY MONTH ON  THE SAME DAY 06/04/14  Yes Margaret Massed, MD  insulin aspart protamine - aspart (NOVOLOG MIX 70/30 FLEXPEN) (70-30) 100 UNIT/ML FlexPen Inject 0.25 mLs (25 Units total) into the skin 2 (two) times daily with a meal. E11.8 Patient taking differently: Inject 30 Units into the skin 2 (two) times daily with a meal. E11.8 06/29/14  Yes Margaret Fickle, MD  ipratropium (ATROVENT) 0.02 % nebulizer solution Take 2.5 mLs (500 mcg total) by nebulization 4 (four) times daily. 05/02/15  Yes Margaret Mcalpine, MD  isosorbide mononitrate (IMDUR) 30 MG 24 hr tablet TAKE 1 TABLET DAILY 06/18/14  Yes Margaret Massed, MD  losartan (COZAAR) 50 MG tablet TAKE ONE TABLET BY MOUTH ONCE DAILY 06/04/14  Yes Margaret Massed, MD  metFORMIN (GLUCOPHAGE) 1000 MG tablet Take 1,000 mg by mouth 2 (two)  times daily with a meal.   Yes Historical Provider, MD  Multiple Vitamin (MULTIVITAMIN) tablet Take 1 tablet by mouth daily.   Yes Historical Provider, MD  nitroGLYCERIN (NITROSTAT) 0.4 MG SL tablet Place 1 tablet (0.4 mg total) under the tongue every 5 (five) minutes as needed. May repeat x3 07/23/12  Yes Dyann Kief, PA-C  omeprazole (PRILOSEC) 20 MG capsule TAKE ONE CAPSULE BY MOUTH ONCE DAILY 30 MINUTES BEFORE A MEAL 10/05/15  Yes Margaret Claude, MD  potassium chloride (K-DUR,KLOR-CON) 10 MEQ tablet TAKE 1 TABLET DAILY 09/28/14  Yes Margaret Massed, MD  Probiotic Product (PROBIOTIC DAILY PO) Take 1 tablet by mouth daily.    Yes Historical Provider, MD  simvastatin (ZOCOR) 80 MG tablet Take 80 mg by mouth daily.   Yes Historical Provider, MD  sodium chloride (OCEAN) 0.65 % SOLN nasal spray Place 1 spray into both nostrils 2 (two) times daily as needed for congestion.   Yes Historical Provider, MD  torsemide (DEMADEX) 20 MG tablet Take 1 tablet (20 mg total) by mouth daily. 10/05/15  Yes Margaret Claude, MD  vitamin B-12 (CYANOCOBALAMIN) 1000 MCG tablet Take 1,000 mcg by mouth daily.   Yes Historical Provider, MD  vitamin C (ASCORBIC ACID)  500 MG tablet Take 500 mg by mouth daily.   Yes Historical Provider, MD  glucose blood (ONE TOUCH ULTRA TEST) test strip TEST BLOOD SUGAR LEVELS THREE TIMES DAILY 01/08/14   Margaret Massed, MD  Menthol, Topical Analgesic, (BENGAY PAIN RELIEF + MASSAGE) 2.5 % GEL Apply topically. Apply a small amount to affected area as needed    Historical Provider, MD  ONE TOUCH LANCETS MISC Check blood sugars two times daily 10/21/12   Margaret Mcalpine, MD  sennosides-docusate sodium (SENOKOT-S) 8.6-50 MG tablet Take 1 tablet by mouth daily as needed for constipation.    Historical Provider, MD  TRUEPLUS INSULIN SYRINGE 30G X 5/16" 0.5 ML MISC USE 3 TIMES A DAY AS DIRECTED 03/24/14   Margaret Massed, MD    Physical Exam: Vitals:   10/11/15 2015 10/11/15 2030 10/11/15 2045 10/11/15 2100  BP: 155/72 145/72 163/76 169/85  Pulse: 83 86 88 88  Resp: 19 19 20 18   Temp:      SpO2: 99% 99% 100% 100%    Constitutional:  . Appears calm and comfortable. Obese Eyes:  . No pallor. No jaundice.  ENMT:  . external ears, nose appear normal Neck:  . Neck is supple. No JVD Respiratory:  . CTA bilaterally, no w/r/r.  . Respiratory effort normal. No retractions or accessory muscle use Cardiovascular:  . S1S2, but distant . No LE extremity edema   Abdomen:  . Abdomen is soft and non tender. Organs are difficult to assess. Neurologic:  . Awake and alert. . Power is 3/5 LUE, but full in other extremities.  Wt Readings from Last 3 Encounters:  10/05/15 99.8 kg (220 lb)  08/18/15 95.7 kg (211 lb)  07/12/15 96.4 kg (212 lb 9.6 oz)    I have personally reviewed following labs and imaging studies  Labs on Admission:  CBC:  Recent Labs Lab 10/05/15 1231 10/11/15 1856 10/11/15 1914  WBC 9.3 13.7*  --   NEUTROABS 6.1 11.6*  --   HGB  --  11.8* 11.9*  HCT 32.8* 36.0 35.0*  MCV 89 94.0  --   PLT 224 208  --    Basic Metabolic Panel:  Recent Labs Lab 10/05/15 1231 10/11/15 1856 10/11/15 1914  NA  144  137 140  K 4.2 4.4 4.3  CL 98 97* 94*  CO2 31* 32  --   GLUCOSE 84 270* 259*  BUN 25 22* 21*  CREATININE 1.21* 1.52* 1.40*  CALCIUM 9.2 9.3  --    Liver Function Tests:  Recent Labs Lab 10/05/15 1231 10/11/15 1856  AST 21 69*  ALT 18 35  ALKPHOS 68 63  BILITOT <0.2 0.4  PROT 6.3 6.8  ALBUMIN 4.0 3.8   No results for input(s): LIPASE, AMYLASE in the last 168 hours. No results for input(s): AMMONIA in the last 168 hours. Coagulation Profile:  Recent Labs Lab 10/11/15 1856  INR 0.99   Cardiac Enzymes:  Recent Labs Lab 10/11/15 1856  TROPONINI 9.95*   BNP (last 3 results)  Recent Labs  07/12/15 1218  PROBNP 84.0   HbA1C: No results for input(s): HGBA1C in the last 72 hours. CBG:  Recent Labs Lab 10/11/15 1901  GLUCAP 246*   Lipid Profile: No results for input(s): CHOL, HDL, LDLCALC, TRIG, CHOLHDL, LDLDIRECT in the last 72 hours. Thyroid Function Tests: No results for input(s): TSH, T4TOTAL, FREET4, T3FREE, THYROIDAB in the last 72 hours. Anemia Panel: No results for input(s): VITAMINB12, FOLATE, FERRITIN, TIBC, IRON, RETICCTPCT in the last 72 hours. Urine analysis:    Component Value Date/Time   COLORURINE YELLOW 10/11/2015 2000   APPEARANCEUR CLEAR 10/11/2015 2000   APPEARANCEUR Clear 10/05/2015 1234   LABSPEC 1.020 10/11/2015 2000   PHURINE 5.5 10/11/2015 2000   GLUCOSEU >1000 (A) 10/11/2015 2000   HGBUR TRACE (A) 10/11/2015 2000   BILIRUBINUR NEGATIVE 10/11/2015 2000   BILIRUBINUR Negative 10/05/2015 1234   KETONESUR TRACE (A) 10/11/2015 2000   PROTEINUR NEGATIVE 10/11/2015 2000   UROBILINOGEN 0.2 06/25/2014 2128   NITRITE NEGATIVE 10/11/2015 2000   LEUKOCYTESUR NEGATIVE 10/11/2015 2000   LEUKOCYTESUR Trace (A) 10/05/2015 1234   Sepsis Labs: @LABRCNTIP (procalcitonin:4,lacticidven:4) )No results found for this or any previous visit (from the past 240 hour(s)).    Radiological Exams on Admission: Ct Head Wo Contrast  Addendum Date:  10/11/2015   ADDENDUM REPORT: 10/11/2015 19:07 ADDENDUM: These results were called by telephone at the time of interpretation on 10/11/2015 at 7:04 pm to Dr. Jacalyn LefevreJULIE HAVILAND , who verbally acknowledged these results. Electronically Signed   By: Gaylyn RongWalter  Liebkemann M.D.   On: 10/11/2015 19:07   Result Date: 10/11/2015 CLINICAL DATA:  Left arm weakness starting at 4 p.m. today. Diabetes and hypertension. EXAM: CT HEAD WITHOUT CONTRAST TECHNIQUE: Contiguous axial images were obtained from the base of the skull through the vertex without intravenous contrast. COMPARISON:  10/31/2014 FINDINGS: Minimal chronic encephalomalacia in the right parietal region, not appreciably changed compared to 10/03/14. Faint linear hypodensity in the right occipital lobe, images 14-15 series 2, also not appreciably changed from 2016 exams. Otherwise, the brainstem, cerebellum, cerebral peduncles, thalami, basal ganglia, basilar cisterns, and ventricular system appear within normal limits. No intracranial hemorrhage, mass lesion, or acute CVA. Mild chronic ethmoid sinusitis. IMPRESSION: 1. No acute intracranial findings. 2. Linear hypodensities in the right occipital and right parietal lobes possibly from remote small infarcts. Not appreciably changed from 10/03/2014. 3. Mild chronic ethmoid sinusitis. Electronically Signed: By: Gaylyn RongWalter  Liebkemann M.D. On: 10/11/2015 19:01   Active Problems:   CVA (cerebral infarction)   Acute CVA (cerebrovascular accident) (HCC)   Assessment/Plan 1. Acute CVA  2. DM, Uncontrolled 3. Hypertension 4. Elevated Troponin, likely secondary to CVA.   Admit patient for further work up  MRI brain  MRA brain and neck  ECHO  Full dose aspirin  Consult Neurology in am  Permissive Hypertension  Cycle cardiac enzyme  Optimize blood sugar control  DVT prophylaxis:  Lovenox Code Status: Full Family Communication: Son Disposition Plan: To be determined   Consults called: Please consult  Neurology in am   Admission status: Inpatient    Time spent: Greater than 60 minutes  Berton Mount, MD  [facility] Pager #: [phone] 7PM-7AM contact night coverage as above   10/11/2015, 9:22 PM

## 2015-10-11 NOTE — ED Notes (Signed)
Pt states she noticed left arm "felt heavy" around 1600 and could not use it. States left arm now feels like "pins and needles", decreased sensation noted to left arm. Per family member pt's speech and LOC is at baseline. Pt is HOH, A&O X4.

## 2015-10-11 NOTE — Progress Notes (Signed)
Call from rn 1841 Call from er secretary [phone] Beeper 1843 Table 1843 Call from soc [phone] Done 1843 Called gr and sent to soc [phone]

## 2015-10-11 NOTE — ED Notes (Signed)
CRITICAL VALUE ALERT  Critical value received:  I-stat troponin 7.47  Date of notification:  10/11/2015  Time of notification:  1930  Critical value read back: yes  Nurse who received alert:  Rory PercyS. Dorothy Polhemus RN  MD notified (1st page):  Particia NearingHaviland  Time of first page:  1930  MD notified (2nd page):  Time of second page:  Responding MD:  Particia NearingHaviland  Time MD responded:  [phone]

## 2015-10-11 NOTE — ED Notes (Signed)
CRITICAL VALUE ALERT  Critical value received:  Troponin 9.95  Date of notification:  10/11/2015  Time of notification:  2015  Critical value read back: yes  Nurse who received alert:  Rory PercyS Monseratt Ledin RN  MD notified (1st page):  Particia NearingHaviland  Time of first page:  2015  MD notified (2nd page):  Time of second page:  Responding MD:  Particia NearingHaviland  Time MD responded:  2015

## 2015-10-11 NOTE — ED Notes (Addendum)
Pt states tactile stimulation is the same on bilateral shoulders, legs, and face; reports sensation is different to left hand and forearm than right. Pt denies SOB/CP.

## 2015-10-11 NOTE — ED Notes (Signed)
Tele-neuro consult finished at this time, per consulting MD pt is not a candidate for TPA.

## 2015-10-11 NOTE — ED Provider Notes (Signed)
[facility]PT Provider Note   CSN: [id number] Arrival date & time: 10/11/15  4098  First Provider Contact:  None       History   Chief Complaint Chief Complaint  Patient presents with  . Code Stroke    HPI [name] is a 75 y.o. female.  Pt has a hx of a prior CVA with left sided deficits, but they had mainly resolved.  Today, she noted that around 1600 she had numbness to her left arm.  She said that she can't move it like normal.  She denies any other deficits.  She did not want to come to the hospital, but family finally convinced her.      Past Medical History:  Diagnosis Date  . Anemia   . Anxiety   . Asthma   . Chronic respiratory failure (HCC)   . COPD (chronic obstructive pulmonary disease) (HCC)   . Coronary atherosclerosis of native coronary artery    a. history of MI with PTCA/stent to LAD 07/1998. b. Repeat PTCA to this lesion in 02/1999. c. PTCA/brachytherapy to mLAD in 2001  . DDD (degenerative disc disease), lumbar   . Essential hypertension, benign   . GERD (gastroesophageal reflux disease)   . Herpes zoster 06/2013  . History of bleeding ulcers   . Hyperlipidemia   . Myocardial infarction (HCC) 2000  . Obesity   . On home oxygen therapy   . OSA (obstructive sleep apnea)   . Osteoporosis   . Takotsubo cardiomyopathy    April 2016  . Type 2 diabetes mellitus (HCC)   . Venous insufficiency     Patient Active Problem List   Diagnosis Date Noted  . CVA (cerebral infarction) 10/11/2015  . Chronic hypoxemic respiratory failure (HCC) 01/26/2015  . OSA (obstructive sleep apnea) 08/31/2014  . Chronic renal disease, stage III 07/06/2014  . Takotsubo cardiomyopathy 06/29/2014  . Acute respiratory failure (HCC)   . Acute on chronic respiratory failure (HCC) 06/23/2014  . Acute renal failure (HCC) 06/23/2014  . Leukocytosis 06/23/2014  . Diabetes mellitus with complication (HCC) 04/02/2014  . Hypoxemic respiratory failure, chronic (HCC)  08/03/2013  . Diabetic neuropathy (HCC) 11/14/2012  . Cardiomyopathy, ischemic 07/04/2012  . Low back pain 10/25/2010  . Essential hypertension, benign 04/27/2009  . CAD S/P percutaneous coronary angioplasty 04/27/2009  . Obesity 05/17/2007  . Hyperlipidemia 01/02/2007  . Venous (peripheral) insufficiency 01/02/2007  . COPD (chronic obstructive pulmonary disease) with chronic bronchitis (HCC) 01/02/2007  . GERD 01/02/2007  . Osteoarthritis 01/02/2007  . OSTEOPOROSIS 01/02/2007    Past Surgical History:  Procedure Laterality Date  . ALVEOLOPLASTY  10/1999   [name] 07/19/2010 Surgical extraction of root segments #21, 22 and 27 with alveoloplasty.  Marland Kitchen CARDIAC CATHETERIZATION  07/2012   + progressive CAD but no lesions to intervene upon, EF 65%; med mgmt continued  . CATARACT EXTRACTION W/ INTRAOCULAR LENS IMPLANT Right 2015?  Marland Kitchen CATARACT EXTRACTION W/PHACO Left 06/30/2012   Procedure: CATARACT EXTRACTION PHACO AND INTRAOCULAR LENS PLACEMENT (IOC);  Surgeon: Gemma Payor, MD;  Location: [facility];  Service: Ophthalmology;  Laterality: Left;  CDE:12.32  . CORONARY ANGIOPLASTY WITH STENT PLACEMENT     PTCA/BMS LAD 2000, PTCA/brachytherapy LAD 2001, LVEF 55%   . CORONARY ANGIOPLASTY WITH STENT PLACEMENT  2000; 2001; 2002   [name] 05/18/2010  . LAPAROSCOPIC CHOLECYSTECTOMY    . LEFT HEART CATHETERIZATION WITH CORONARY ANGIOGRAM N/A 06/25/2014   Procedure: LEFT HEART CATHETERIZATION WITH CORONARY ANGIOGRAM;  Surgeon: Lyn Records, MD;  Location: [facility];  Service: Cardiovascular;  Laterality: N/A;  . SHOULDER OPEN ROTATOR CUFF REPAIR Right   . TONSILLECTOMY  1949  . TOTAL ABDOMINAL HYSTERECTOMY     DUB (non-malignant reasons).  No hx of abnormal pap smears.  . TRANSTHORACIC ECHOCARDIOGRAM  06/2013   EF 40%, + WMA    OB History    No data available       Home Medications    Prior to Admission medications   Medication Sig Start Date End Date Taking? Authorizing Provider  albuterol  (PROAIR HFA) 108 (90 BASE) MCG/ACT inhaler Inhale 1-2 puffs into the lungs every 6 (six) hours as needed for wheezing or shortness of breath. 01/26/15  Yes Michele McalpineScott M Nadel, MD  albuterol (PROVENTIL) (2.5 MG/3ML) 0.083% nebulizer solution Take 3 mLs (2.5 mg total) by nebulization 4 (four) times daily. 03/18/15  Yes Michele McalpineScott M Nadel, MD  ALPRAZolam Prudy Feeler(XANAX) 0.5 MG tablet Take 1 tablet (0.5 mg total) by mouth 3 (three) times daily. 06/29/14  Yes Renae FickleMackenzie Short, MD  amoxicillin-clavulanate (AUGMENTIN) 875-125 MG tablet Take 1 tablet by mouth 2 (two) times daily. Take all of this medication 10/05/15  Yes Mechele ClaudeWarren Stacks, MD  aspirin 81 MG tablet Take 81 mg by mouth daily.   Yes Historical Provider, MD  bisoprolol (ZEBETA) 5 MG tablet Take 0.5 tablets (2.5 mg total) by mouth daily. 10/05/15  Yes Mechele ClaudeWarren Stacks, MD  budesonide (PULMICORT) 0.25 MG/2ML nebulizer solution Take 2 mLs (0.25 mg total) by nebulization 2 (two) times daily. 05/02/15  Yes Michele McalpineScott M Nadel, MD  calcium-vitamin D (OSCAL WITH D) 500-200 MG-UNIT per tablet Take 1 tablet by mouth 2 (two) times daily.     Yes Historical Provider, MD  citalopram (CELEXA) 10 MG tablet Take 10 mg by mouth daily.   Yes Historical Provider, MD  dextromethorphan-guaiFENesin (MUCINEX DM) 30-600 MG per 12 hr tablet Take 2 tablets by mouth every 12 (twelve) hours.     Yes Historical Provider, MD  Diphenhyd-Hydrocort-Nystatin (FIRST-DUKES MOUTHWASH) SUSP Gargle and swallow 1 tsp four times daily Patient taking differently: Take 5 mLs by mouth 4 (four) times daily. Gargle and swallow 1 tsp four times daily 05/12/15  Yes Michele McalpineScott M Nadel, MD  ferrous sulfate 325 (65 FE) MG tablet Take 325 mg by mouth daily with breakfast.    Yes Historical Provider, MD  fluticasone (FLONASE) 50 MCG/ACT nasal spray Place 1 spray into both nostrils 2 times daily at 12 noon and 4 pm.   Yes Historical Provider, MD  HYDROcodone-acetaminophen (NORCO/VICODIN) 5-325 MG tablet Take 1 tablet by mouth 2 (two) times  daily. 10/05/15  Yes Mechele ClaudeWarren Stacks, MD  ibandronate (BONIVA) 150 MG tablet TAKE 1 TABLET EVERY MONTH ON THE SAME DAY 06/04/14  Yes Jeoffrey MassedPhilip H McGowen, MD  insulin aspart protamine - aspart (NOVOLOG MIX 70/30 FLEXPEN) (70-30) 100 UNIT/ML FlexPen Inject 0.25 mLs (25 Units total) into the skin 2 (two) times daily with a meal. E11.8 Patient taking differently: Inject 30 Units into the skin 2 (two) times daily with a meal. E11.8 06/29/14  Yes Renae FickleMackenzie Short, MD  ipratropium (ATROVENT) 0.02 % nebulizer solution Take 2.5 mLs (500 mcg total) by nebulization 4 (four) times daily. 05/02/15  Yes Michele McalpineScott M Nadel, MD  isosorbide mononitrate (IMDUR) 30 MG 24 hr tablet TAKE 1 TABLET DAILY 06/18/14  Yes Jeoffrey MassedPhilip H McGowen, MD  losartan (COZAAR) 50 MG tablet TAKE ONE TABLET BY MOUTH ONCE DAILY 06/04/14  Yes Jeoffrey MassedPhilip H McGowen, MD  metFORMIN (GLUCOPHAGE) 1000 MG  tablet Take 1,000 mg by mouth 2 (two) times daily with a meal.   Yes Historical Provider, MD  Multiple Vitamin (MULTIVITAMIN) tablet Take 1 tablet by mouth daily.   Yes Historical Provider, MD  nitroGLYCERIN (NITROSTAT) 0.4 MG SL tablet Place 1 tablet (0.4 mg total) under the tongue every 5 (five) minutes as needed. May repeat x3 07/23/12  Yes Dyann Kief, PA-C  omeprazole (PRILOSEC) 20 MG capsule TAKE ONE CAPSULE BY MOUTH ONCE DAILY 30 MINUTES BEFORE A MEAL 10/05/15  Yes Mechele Claude, MD  potassium chloride (K-DUR,KLOR-CON) 10 MEQ tablet TAKE 1 TABLET DAILY 09/28/14  Yes Jeoffrey Massed, MD  Probiotic Product (PROBIOTIC DAILY PO) Take 1 tablet by mouth daily.    Yes Historical Provider, MD  simvastatin (ZOCOR) 80 MG tablet Take 80 mg by mouth daily.   Yes Historical Provider, MD  sodium chloride (OCEAN) 0.65 % SOLN nasal spray Place 1 spray into both nostrils 2 (two) times daily as needed for congestion.   Yes Historical Provider, MD  torsemide (DEMADEX) 20 MG tablet Take 1 tablet (20 mg total) by mouth daily. 10/05/15  Yes Mechele Claude, MD  vitamin B-12 (CYANOCOBALAMIN)  1000 MCG tablet Take 1,000 mcg by mouth daily.   Yes Historical Provider, MD  vitamin C (ASCORBIC ACID) 500 MG tablet Take 500 mg by mouth daily.   Yes Historical Provider, MD  glucose blood (ONE TOUCH ULTRA TEST) test strip TEST BLOOD SUGAR LEVELS THREE TIMES DAILY 01/08/14   Jeoffrey Massed, MD  Menthol, Topical Analgesic, (BENGAY PAIN RELIEF + MASSAGE) 2.5 % GEL Apply topically. Apply a small amount to affected area as needed    Historical Provider, MD  ONE TOUCH LANCETS MISC Check blood sugars two times daily 10/21/12   Michele Mcalpine, MD  sennosides-docusate sodium (SENOKOT-S) 8.6-50 MG tablet Take 1 tablet by mouth daily as needed for constipation.    Historical Provider, MD  TRUEPLUS INSULIN SYRINGE 30G X 5/16" 0.5 ML MISC USE 3 TIMES A DAY AS DIRECTED 03/24/14   Jeoffrey Massed, MD    Family History Family History  Problem Relation Age of Onset  . Stroke Mother   . Heart attack Father   . Coronary artery disease Other   . Stroke Sister   . Stroke Brother   . Heart attack Brother     Social History Social History  Substance Use Topics  . Smoking status: Former Smoker    Packs/day: 1.00    Years: 30.00    Types: Cigarettes    Quit date: 07/04/1998  . Smokeless tobacco: Never Used  . Alcohol use No     Allergies   Codeine and Terbutaline sulfate   Review of Systems Review of Systems  Neurological: Positive for weakness.  All other systems reviewed and are negative.    Physical Exam Updated Vital Signs BP 163/76   Pulse 88   Temp 98.7 F (37.1 C)   Resp 20   SpO2 100%   Physical Exam  Constitutional: She is oriented to person, place, and time. She appears well-developed and well-nourished.  HENT:  Head: Normocephalic and atraumatic.  Right Ear: External ear normal.  Left Ear: External ear normal.  Nose: Nose normal.  Mouth/Throat: Oropharynx is clear and moist.  Eyes: Conjunctivae and EOM are normal. Pupils are equal, round, and reactive to light.  Neck:  Normal range of motion. Neck supple.  Cardiovascular: Normal rate, regular rhythm, normal heart sounds and intact distal pulses.   Pulmonary/Chest:  Effort normal and breath sounds normal.  Abdominal: Soft. Bowel sounds are normal.  Musculoskeletal: Normal range of motion.  Neurological: She is alert and oriented to person, place, and time.  Left arm weakness and numbness.  I give her a NIHSS of 3.  Skin: Skin is warm and dry.  Psychiatric: She has a normal mood and affect. Her behavior is normal. Judgment and thought content normal.  Nursing note and vitals reviewed.    ED Treatments / Results  Labs (all labs ordered are listed, but only abnormal results are displayed) Labs Reviewed  CBC - Abnormal; Notable for the following:       Result Value   WBC 13.7 (*)    RBC 3.83 (*)    Hemoglobin 11.8 (*)    All other components within normal limits  DIFFERENTIAL - Abnormal; Notable for the following:    Neutro Abs 11.6 (*)    Monocytes Absolute 1.1 (*)    All other components within normal limits  COMPREHENSIVE METABOLIC PANEL - Abnormal; Notable for the following:    Chloride 97 (*)    Glucose, Bld 270 (*)    BUN 22 (*)    Creatinine, Ser 1.52 (*)    AST 69 (*)    GFR calc non Af Amer 32 (*)    GFR calc Af Amer 38 (*)    All other components within normal limits  URINE RAPID DRUG SCREEN, HOSP PERFORMED - Abnormal; Notable for the following:    Opiates POSITIVE (*)    Benzodiazepines POSITIVE (*)    All other components within normal limits  URINALYSIS, ROUTINE W REFLEX MICROSCOPIC (NOT AT [facility]) - Abnormal; Notable for the following:    Glucose, UA >1000 (*)    Hgb urine dipstick TRACE (*)    Ketones, ur TRACE (*)    All other components within normal limits  TROPONIN I - Abnormal; Notable for the following:    Troponin I 9.95 (*)    All other components within normal limits  URINE MICROSCOPIC-ADD ON - Abnormal; Notable for the following:    Squamous Epithelial / LPF 6-30  (*)    Bacteria, UA RARE (*)    All other components within normal limits  I-STAT CHEM 8, ED - Abnormal; Notable for the following:    Chloride 94 (*)    BUN 21 (*)    Creatinine, Ser 1.40 (*)    Glucose, Bld 259 (*)    Hemoglobin 11.9 (*)    HCT 35.0 (*)    All other components within normal limits  I-STAT TROPOININ, ED - Abnormal; Notable for the following:    Troponin i, poc 7.47 (*)    All other components within normal limits  CBG MONITORING, ED - Abnormal; Notable for the following:    Glucose-Capillary 246 (*)    All other components within normal limits  ETHANOL  PROTIME-INR  APTT    EKG  EKG Interpretation  Date/Time:  Tuesday October 11 2015 19:10:34 EDT Ventricular Rate:  84 PR Interval:    QRS Duration: 136 QT Interval:  363 QTC Calculation: 430 R Axis:   -80 Text Interpretation:  Sinus rhythm Atrial premature complex Nonspecific IVCD with LAD Left ventricular hypertrophy Anterior infarct, old Confirmed by [facility] MD, Frederico Gerling [phone]) on 10/11/2015 7:19:08 PM       Radiology Ct Head Wo Contrast  Addendum Date: 10/11/2015   ADDENDUM REPORT: 10/11/2015 19:07 ADDENDUM: These results were called by telephone at the time of interpretation on  10/11/2015 at 7:04 pm to Dr. Jacalyn Lefevre , who verbally acknowledged these results. Electronically Signed   By: Gaylyn Rong M.D.   On: 10/11/2015 19:07   Result Date: 10/11/2015 CLINICAL DATA:  Left arm weakness starting at 4 p.m. today. Diabetes and hypertension. EXAM: CT HEAD WITHOUT CONTRAST TECHNIQUE: Contiguous axial images were obtained from the base of the skull through the vertex without intravenous contrast. COMPARISON:  10/31/2014 FINDINGS: Minimal chronic encephalomalacia in the right parietal region, not appreciably changed compared to 10/03/14. Faint linear hypodensity in the right occipital lobe, images 14-15 series 2, also not appreciably changed from 2016 exams. Otherwise, the brainstem, cerebellum, cerebral  peduncles, thalami, basal ganglia, basilar cisterns, and ventricular system appear within normal limits. No intracranial hemorrhage, mass lesion, or acute CVA. Mild chronic ethmoid sinusitis. IMPRESSION: 1. No acute intracranial findings. 2. Linear hypodensities in the right occipital and right parietal lobes possibly from remote small infarcts. Not appreciably changed from 10/03/2014. 3. Mild chronic ethmoid sinusitis. Electronically Signed: By: Gaylyn Rong M.D. On: 10/11/2015 19:01    Procedures Procedures (including critical care time)  Medications Ordered in ED Medications  aspirin tablet 325 mg (325 mg Oral Given 10/11/15 1941)     Initial Impression / Assessment and Plan / ED Course  I have reviewed the triage vital signs and the nursing notes.  Pertinent labs & imaging results that were available during my care of the patient were reviewed by me and considered in my medical decision making (see chart for details).  Clinical Course   Pt d/w Dr. Elta Guadeloupe who examined pt via tele neurology.  He Does not think pt is a candidate for tPA.  He recommended admission for a stroke work up.  During the course of pt's evaluation, it was noted that her troponin was elevated.  Pt denies any cp or sob.  Pt had a cath done in April of 2016 which showed nonobstructive CAD.  She had elevated troponins during that admission also and it was thought to be due to the stress of her COPD exacerbation at that time.  I spoke with Dr. Tarri Glenn (cardiology) and he felt that pt did not need to come to [facility] as she is not having CP or SOB.  If anything changes then, we can call him.    Pt d/w Dr. Dartha Lodge who will admit pt. Final Clinical Impressions(s) / ED Diagnoses   Final diagnoses:  Cerebral infarction due to unspecified mechanism  Type 2 diabetes mellitus with hyperglycemia, with long-term current use of insulin (HCC)  Elevated troponin    New Prescriptions New Prescriptions   No medications on file       Jacalyn Lefevre, MD 10/11/15 [phone]

## 2015-10-11 NOTE — ED Triage Notes (Signed)
Patient complaining of numbness to left arm starting at 1600 today. Patient has drift and weakness noted to left arm. States she has history of same from previous stroke, family member states "It's worse today. And she has been sleeping a lot today." Patient states she slept earlier and woke up to her oxygen tubing "had came off." States she went back to sleep and woke at 1600 to numbness to left arm.

## 2015-10-12 ENCOUNTER — Inpatient Hospital Stay (HOSPITAL_COMMUNITY): Payer: Medicare HMO

## 2015-10-12 ENCOUNTER — Ambulatory Visit: Payer: Self-pay | Admitting: Pulmonary Disease

## 2015-10-12 DIAGNOSIS — E785 Hyperlipidemia, unspecified: Secondary | ICD-10-CM

## 2015-10-12 DIAGNOSIS — E1121 Type 2 diabetes mellitus with diabetic nephropathy: Secondary | ICD-10-CM

## 2015-10-12 DIAGNOSIS — K219 Gastro-esophageal reflux disease without esophagitis: Secondary | ICD-10-CM

## 2015-10-12 DIAGNOSIS — R7989 Other specified abnormal findings of blood chemistry: Secondary | ICD-10-CM

## 2015-10-12 DIAGNOSIS — G459 Transient cerebral ischemic attack, unspecified: Secondary | ICD-10-CM

## 2015-10-12 LAB — TROPONIN I: Troponin I: 7.78 ng/mL (ref <0.03)

## 2015-10-12 LAB — MRSA PCR SCREENING: MRSA by PCR: NEGATIVE

## 2015-10-12 LAB — ECHOCARDIOGRAM COMPLETE
Height: 63 in
Weight: 3436.8 oz

## 2015-10-12 LAB — LIPID PANEL
Cholesterol: 146 mg/dL (ref 0–200)
HDL: 32 mg/dL — ABNORMAL LOW (ref >40)
LDL Cholesterol: 61 mg/dL (ref 0–99)
Total CHOL/HDL Ratio: 4.6 RATIO
Triglycerides: 265 mg/dL — ABNORMAL HIGH (ref <150)
VLDL: 53 mg/dL — ABNORMAL HIGH (ref 0–40)

## 2015-10-12 LAB — GLUCOSE, CAPILLARY
Glucose-Capillary: 271 mg/dL — ABNORMAL HIGH (ref 65–99)
Glucose-Capillary: 268 mg/dL — ABNORMAL HIGH (ref 65–99)
Glucose-Capillary: 225 mg/dL — ABNORMAL HIGH (ref 65–99)
Glucose-Capillary: 139 mg/dL — ABNORMAL HIGH (ref 65–99)

## 2015-10-12 MED ORDER — IPRATROPIUM-ALBUTEROL 0.5-2.5 (3) MG/3ML IN SOLN
3.0000 mL | Freq: Four times a day (QID) | RESPIRATORY_TRACT | Status: DC
  Administered 2015-10-12 – 2015-10-16 (×16): 3 mL via RESPIRATORY_TRACT
  Filled 2015-10-12 (×16): qty 3

## 2015-10-12 MED ORDER — CLOPIDOGREL BISULFATE 75 MG PO TABS
75.0000 mg | ORAL_TABLET | Freq: Every day | ORAL | Status: DC
  Administered 2015-10-12 – 2015-10-14 (×3): 75 mg via ORAL
  Filled 2015-10-12 (×3): qty 1

## 2015-10-12 MED ORDER — GADOBENATE DIMEGLUMINE 529 MG/ML IV SOLN
20.0000 mL | Freq: Once | INTRAVENOUS | Status: AC | PRN
  Administered 2015-10-12: 20 mL via INTRAVENOUS

## 2015-10-12 MED ORDER — PERFLUTREN LIPID MICROSPHERE
1.0000 mL | INTRAVENOUS | Status: AC | PRN
  Administered 2015-10-12: 3 mL via INTRAVENOUS
  Filled 2015-10-12: qty 10

## 2015-10-12 NOTE — Progress Notes (Signed)
OT Cancellation Note  Patient Details Name: [name] MRN: [id number] DOB: 01/13/1941   Cancelled Treatment:      Reason Eval/Treat Not Completed: Patient not medically ready. Pt with troponin at 7.78 this morning, down from 9.95 yesterday. However pt with cardiac history including NSTEMI diagnosis on 10/05/15. Will hold OT evaluation until troponin continues to downtrend. OT will continue to monitor and will evaluation when appropriate.    Ezra SitesLeslie Bayden Gil, OTR/L  [phone] 10/12/2015, 9:04 AM

## 2015-10-12 NOTE — Progress Notes (Signed)
[facility] PROGRESS NOTE  [name] WJX:[id number] DOB: 10/14/1940 DOA: 10/11/2015 PCP: Toma Deiters, MD  Interim summary and HPI 75 year old female with history of 2 previous CVA's, DM, HTN, HL, CKD 3 and morbid obesity. Patient presents with left upper extremity weakness that started about 5 hours. The patient was deemed to be outside of the window for TPA at presentation. No headache, no SOB, no fever or chills, no chest pain and no GI or urinary symptoms. CT scan has not revealed any acute findings. Troponin is greater than 9 on admission.   Assessment/Plan: 1-stroke like symptoms: with scattered ischemic areas affecting parietal lobe (small hemorrhage seen) -neg CT head -2-D echo and carotid duplex pending -patient w/o significant residual deficit  -was on baby aspirin prior to admission  -neurology consulted -discussed with family; might need change to plavix for secondary prevention -will follow PT/OT rec's -continue risk factors modifications  2-elevated troponin -w/o CP or SOB -will monitor on telemetry -troponin level trending down -will follow echo results -cardiology consulted  3-HTN: -will allow permissive HTN  4-type 2 diabetes with nephropathy -will continue SSI and modified carbohydrates  5-CKD stage 3 -Cr at baseline -will monitor trend  6-HLD -continue statins  7-depression and anxiety  -will continue citalopram and PRN ativan  8-GERD: -will continue PPI  Code Status: Full Family Communication: daughter at bedside  Disposition Plan: to be determine; will follow PT/OT recommendations and complete stroke work up  Consultants:  Cardiology  Neurology   Procedures:  2-D echo pending   MRI head Scattered small areas of acute infarct with small amount of hemorrhage in the right parietal lobe. There is also a small area of chronic infarction in this area   MRA head and neck -Tortuosity of the proximal internal carotid artery limits  resolution -There appears to be mild stenosis at the origin of the right internal and external carotid arteries. -Left carotid widely patent.  Both vertebral arteries widely patent. -Proximal great vessels in the circle of Willis are widely patent without stenosis.   Carotid duplex:pending  Antibiotics:  None   HPI/Subjective: Afebrile, in no acute distress. Denies left side weakness, CP and SOB.  Objective: Vitals:   10/12/15 0605 10/12/15 0805  BP: 132/77 (!) 172/68  Pulse: (!) 102 92  Resp: (!) 22 20  Temp: 98.2 F (36.8 C) 98 F (36.7 C)    Intake/Output Summary (Last 24 hours) at 10/12/15 1341 Last data filed at 10/12/15 0942  Gross per 24 hour  Intake              120 ml  Output                0 ml  Net              120 ml   Filed Weights   10/11/15 2205  Weight: 97.4 kg (214 lb 12.8 oz)    Exam:   General:  Afebrile, no CP, no SOB. 2L [place] in place (chronically and unchanged)   Cardiovascular: S1 and S2, no rubs or gallops  Respiratory: good air movement, no crackles   Abdomen: obese, soft, NT, ND, positive BS  Musculoskeletal: no edema or cyanosis   Data Reviewed: Basic Metabolic Panel:  Recent Labs Lab 10/11/15 1856 10/11/15 1914  NA 137 140  K 4.4 4.3  CL 97* 94*  CO2 32  --   GLUCOSE 270* 259*  BUN 22* 21*  CREATININE 1.52* 1.40*  CALCIUM  9.3  --    Liver Function Tests:  Recent Labs Lab 10/11/15 1856  AST 69*  ALT 35  ALKPHOS 63  BILITOT 0.4  PROT 6.8  ALBUMIN 3.8   CBC:  Recent Labs Lab 10/11/15 1856 10/11/15 1914  WBC 13.7*  --   NEUTROABS 11.6*  --   HGB 11.8* 11.9*  HCT 36.0 35.0*  MCV 94.0  --   PLT 208  --    Cardiac Enzymes:  Recent Labs Lab 10/11/15 1856 10/12/15 0053 10/12/15 0608  TROPONINI 9.95* CRITICAL VALUE NOTED.  VALUE IS CONSISTENT WITH PREVIOUSLY REPORTED AND CALLED VALUE. 7.78*   ProBNP (last 3 results)  Recent Labs  07/12/15 1218  PROBNP 84.0    CBG:  Recent Labs Lab  10/11/15 1901 10/12/15 0824 10/12/15 1127  GLUCAP 246* 271* 268*   Studies: Ct Head Wo Contrast  Addendum Date: 10/11/2015   ADDENDUM REPORT: 10/11/2015 19:07 ADDENDUM: These results were called by telephone at the time of interpretation on 10/11/2015 at 7:04 pm to Dr. Jacalyn Lefevre , who verbally acknowledged these results. Electronically Signed   By: Gaylyn Rong M.D.   On: 10/11/2015 19:07   Result Date: 10/11/2015 CLINICAL DATA:  Left arm weakness starting at 4 p.m. today. Diabetes and hypertension. EXAM: CT HEAD WITHOUT CONTRAST TECHNIQUE: Contiguous axial images were obtained from the base of the skull through the vertex without intravenous contrast. COMPARISON:  10/31/2014 FINDINGS: Minimal chronic encephalomalacia in the right parietal region, not appreciably changed compared to 10/03/14. Faint linear hypodensity in the right occipital lobe, images 14-15 series 2, also not appreciably changed from 2016 exams. Otherwise, the brainstem, cerebellum, cerebral peduncles, thalami, basal ganglia, basilar cisterns, and ventricular system appear within normal limits. No intracranial hemorrhage, mass lesion, or acute CVA. Mild chronic ethmoid sinusitis. IMPRESSION: 1. No acute intracranial findings. 2. Linear hypodensities in the right occipital and right parietal lobes possibly from remote small infarcts. Not appreciably changed from 10/03/2014. 3. Mild chronic ethmoid sinusitis. Electronically Signed: By: Gaylyn Rong M.D. On: 10/11/2015 19:01   Mr Maxine Glenn Head Wo Contrast  Result Date: 10/12/2015 CLINICAL DATA:  Right parietal stroke EXAM: MRA HEAD WITHOUT CONTRAST TECHNIQUE: Angiographic images of the Circle of Willis were obtained using MRA technique without intravenous contrast. COMPARISON:  MRI head 10/12/2015 FINDINGS: Image quality degraded by intracranial vessel tortuosity. There is decreased signal in the posterior cerebral arteries due to tortuosity. There is also artifact over the MCA  branch vessels. Both vertebral arteries patent to the basilar. Left PICA patent. Right PICA not visualized. Basilar patent. Superior cerebellar arteries are patent. Proximal posterior cerebral arteries are patent. Mid and distal posterior cerebral arteries not visualized due to tortuosity Cavernous carotid widely patent bilaterally. Anterior and middle cerebral arteries are patent without significant stenosis. Distal branches not well visualized due to artifact. IMPRESSION: Exam quality degraded due to artifact and tortuosity. Proximal great vessels in the circle of Willis are widely patent without stenosis. Electronically Signed   By: Marlan Palau M.D.   On: 10/12/2015 11:11   Mr Angiogram Neck W Contrast  Result Date: 10/12/2015 CLINICAL DATA:  Right parietal stroke. EXAM: MRI HEAD AND MRA NECK WITH CONTRAST TECHNIQUE: Multiplanar and multiecho pulse sequences of the neck were obtained with intravenous contrast. Angiographic images of the neck were obtained using MRA technique with intravenous contast. CONTRAST:  20mL MULTIHANCE GADOBENATE DIMEGLUMINE 529 MG/ML IV SOLN COMPARISON:  MRI head 10/12/2015 FINDINGS: Both vertebral arteries widely patent to the basilar without stenosis Tortuosity  of the proximal internal carotid artery bilaterally causing signal loss. No evidence of dissection. Mild irregularity in the right carotid bifurcation with mild stenosis of the right internal and external carotid arteries. Left carotid bifurcation widely patent. IMPRESSION: Tortuosity of the proximal internal carotid artery limits resolution There appears to be mild stenosis at the origin of the right internal and external carotid arteries. Left carotid widely patent.  Both vertebral arteries widely patent. Electronically Signed   By: Marlan Palauharles  Clark M.D.   On: 10/12/2015 11:14   Mr Brain Wo Contrast  Result Date: 10/12/2015 CLINICAL DATA:  CVA.  Left arm weakness EXAM: MRI HEAD WITHOUT CONTRAST TECHNIQUE: Multiplanar,  multiecho pulse sequences of the brain and surrounding structures were obtained without intravenous contrast. COMPARISON:  CT head 10/11/2015 FINDINGS: Acute infarct in the right posterior parietal lobe extending near the occipital lobe. This is likely all in the posterior right MCA territory. Small amount of associated hemorrhage in the posterior portion of the infarct. Based on prior CT is also a small area of chronic infarct in the right parietal cortex in this area. No other areas of acute infarct Basal ganglia and brainstem intact. Mild chronic microvascular ischemic changes in the white matter elsewhere. Negative for mass or edema.  No shift of the midline structures Generalized atrophy. Circle of Willis appears patent. Mild mucosal edema in the paranasal sinuses. Bilateral lens replacement. 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 IMPRESSION: Scattered small areas of acute infarct with small amount of hemorrhage in the right parietal lobe. There is also a small area of chronic infarction in this area. Generalized atrophy. Electronically Signed   By: Marlan Palauharles  Clark M.D.   On: 10/12/2015 11:08    Scheduled Meds: . acidophilus  1 capsule Oral Daily  . ALPRAZolam  0.5 mg Oral TID  . aspirin  325 mg Oral Daily  . atorvastatin  40 mg Oral q1800  . bisoprolol  2.5 mg Oral Daily  . budesonide  0.25 mg Nebulization BID  . calcium-vitamin D  1 tablet Oral BID  . citalopram  10 mg Oral Daily  . enoxaparin (LOVENOX) injection  40 mg Subcutaneous Q24H  . ferrous sulfate  325 mg Oral Q breakfast  . fluticasone  1 spray Each Nare BID  . HYDROcodone-acetaminophen  1 tablet Oral BID  . insulin aspart  0-5 Units Subcutaneous QHS  . insulin aspart  0-9 Units Subcutaneous TID WC  . insulin aspart protamine- aspart  30 Units Subcutaneous BID WC  . ipratropium-albuterol  3 mL Nebulization Q6H  . isosorbide mononitrate  30 mg Oral Daily  . losartan  50 mg Oral  Daily  . magic mouthwash  5 mL Oral QID  . multivitamin with minerals  1 tablet Oral Daily  . pantoprazole  40 mg Oral Daily  . torsemide  20 mg Oral Daily  . vitamin B-12  1,000 mcg Oral Daily     Time spent: 30 minutes    Vassie LollMadera, Louden Houseworth  [facility] Pager [phone]. If 7PM-7AM, please contact night-coverage at [email or web address], password [email or web address] 10/12/2015, 1:41 PM  LOS: 1 day

## 2015-10-12 NOTE — Evaluation (Signed)
Speech Language Pathology Evaluation Patient Details Name: [name] MRN: [id number] DOB: 10/16/1940 Today's Date: 10/12/2015 Time: [id number] SLP Time Calculation (min) (ACUTE ONLY): 21 min  Problem List:  Patient Active Problem List   Diagnosis Date Noted  . CVA (cerebral infarction) 10/11/2015  . Acute CVA (cerebrovascular accident) (HCC) 10/11/2015  . Chronic hypoxemic respiratory failure (HCC) 01/26/2015  . OSA (obstructive sleep apnea) 08/31/2014  . Chronic renal disease, stage III 07/06/2014  . Takotsubo cardiomyopathy 06/29/2014  . Acute respiratory failure (HCC)   . Acute on chronic respiratory failure (HCC) 06/23/2014  . Acute renal failure (HCC) 06/23/2014  . Leukocytosis 06/23/2014  . Diabetes mellitus with complication (HCC) 04/02/2014  . Hypoxemic respiratory failure, chronic (HCC) 08/03/2013  . Diabetic neuropathy (HCC) 11/14/2012  . Cardiomyopathy, ischemic 07/04/2012  . Low back pain 10/25/2010  . Essential hypertension, benign 04/27/2009  . CAD S/P percutaneous coronary angioplasty 04/27/2009  . Obesity 05/17/2007  . Hyperlipidemia 01/02/2007  . Venous (peripheral) insufficiency 01/02/2007  . COPD (chronic obstructive pulmonary disease) with chronic bronchitis (HCC) 01/02/2007  . GERD 01/02/2007  . Osteoarthritis 01/02/2007  . OSTEOPOROSIS 01/02/2007   Past Medical History:  Past Medical History:  Diagnosis Date  . Anemia   . Anxiety   . Asthma   . Chronic respiratory failure (HCC)   . COPD (chronic obstructive pulmonary disease) (HCC)   . Coronary atherosclerosis of native coronary artery    a. history of MI with PTCA/stent to LAD 07/1998. b. Repeat PTCA to this lesion in 02/1999. c. PTCA/brachytherapy to mLAD in 2001  . DDD (degenerative disc disease), lumbar   . Essential hypertension, benign   . GERD (gastroesophageal reflux disease)   . Herpes zoster 06/2013  . History of bleeding ulcers   . Hyperlipidemia   . Myocardial infarction (HCC) 2000  .  Obesity   . On home oxygen therapy   . OSA (obstructive sleep apnea)   . Osteoporosis   . Takotsubo cardiomyopathy    April 2016  . Type 2 diabetes mellitus (HCC)   . Venous insufficiency    Past Surgical History:  Past Surgical History:  Procedure Laterality Date  . ALVEOLOPLASTY  10/1999   [name]/notes 07/19/2010 Surgical extraction of root segments #21, 22 and 27 with alveoloplasty.  Marland Kitchen. CARDIAC CATHETERIZATION  07/2012   + progressive CAD but no lesions to intervene upon, EF 65%; med mgmt continued  . CATARACT EXTRACTION W/ INTRAOCULAR LENS IMPLANT Right 2015?  Marland Kitchen. CATARACT EXTRACTION W/PHACO Left 06/30/2012   Procedure: CATARACT EXTRACTION PHACO AND INTRAOCULAR LENS PLACEMENT (IOC);  Surgeon: Gemma PayorKerry Hunt, MD;  Location: [facility];  Service: Ophthalmology;  Laterality: Left;  CDE:12.32  . CORONARY ANGIOPLASTY WITH STENT PLACEMENT     PTCA/BMS LAD 2000, PTCA/brachytherapy LAD 2001, LVEF 55%   . CORONARY ANGIOPLASTY WITH STENT PLACEMENT  2000; 2001; 2002   [name]/notes 05/18/2010  . LAPAROSCOPIC CHOLECYSTECTOMY    . LEFT HEART CATHETERIZATION WITH CORONARY ANGIOGRAM N/A 06/25/2014   Procedure: LEFT HEART CATHETERIZATION WITH CORONARY ANGIOGRAM;  Surgeon: Lyn RecordsHenry W Smith, MD;  [facility];  Service: Cardiovascular;  Laterality: N/A;  . SHOULDER OPEN ROTATOR CUFF REPAIR Right   . TONSILLECTOMY  1949  . TOTAL ABDOMINAL HYSTERECTOMY     DUB (non-malignant reasons).  No hx of abnormal pap smears.  . TRANSTHORACIC ECHOCARDIOGRAM  06/2013   EF 40%, + WMA   HPI:  Pt is a 75 year old female with history of 2 previous CVA's, DM, HTN, HL, CKD 3  and morbid obesity. Patient presents with left upper extremity weakness that started about 5 hours. The patient was deemed to be outside of the window for TPA at presentation. No headache, no SOB, no fever or chills, no chest pain and no GI or urinary symptoms. CT scan has not revealed any acute findings. Troponin is greater than 9 but patient has no chest pain, SOB or  diaphoresis. MRI of head revealed Scattered small areas of acute infarct with small amount of hemorrhage in the right parietal lobe. There is also a small area of chronic infarction in this area.   Assessment / Plan / Recommendation Clinical Impression  Pt presents with mild to moderate cognitive deficits as evidenced by the MOCA: 20/30. Note PLOF pt lives alone and is independent with some complex ADLs including medicine managment. Pt self reports memory deficits from prior CVA, difficult to determine if current deficits are worse than baseline. Cognitive deficits noted this date are characterized by decreased novel recall, decreased mental manipulation, and decreased executive function skills. Recommend HH SLP to ensure safe completion of complex ADLs. All further ST needs can be addressed at next level of care.      SLP Assessment  All further Speech Lanaguage Pathology  needs can be addressed in the next venue of care    Follow Up Recommendations  Home health SLP    Frequency and Duration           SLP Evaluation Prior Functioning  Cognitive/Linguistic Baseline: Baseline deficits Baseline deficit details: recall deficits  Type of Home: Apartment  Lives With: Alone Available Help at Discharge: Available PRN/intermittently;Family Education: 9th grade Vocation: Retired   Probation officer Status: Impaired/Different from baseline Arousal/Alertness: Awake/alert Orientation Level: Oriented X4 Memory: Impaired Memory Impairment: Decreased recall of new information Awareness: Appears intact Problem Solving: Appears intact Executive Function: Organizing Organizing: Impaired Organizing Impairment: Verbal complex;Functional complex Safety/Judgment: Appears intact    Comprehension  Auditory Comprehension Overall Auditory Comprehension: Appears within functional limits for tasks assessed Yes/No Questions: Within Functional Limits Commands: Within Functional Limits Visual  Recognition/Discrimination Discrimination: Within Function Limits Reading Comprehension Reading Status: Within funtional limits    Expression Expression Primary Mode of Expression: Verbal Verbal Expression Overall Verbal Expression: Appears within functional limits for tasks assessed Written Expression Dominant Hand: Right Written Expression: Within Functional Limits   Oral / Motor  Oral Motor/Sensory Function Overall Oral Motor/Sensory Function: Within functional limits Motor Speech Overall Motor Speech: Appears within functional limits for tasks assessed   GO                   Marcene Duos MA, CCC-SLP Acute Care Speech Language Pathologist    Kennieth Rad 10/12/2015, 3:47 PM

## 2015-10-12 NOTE — Progress Notes (Signed)
CRITICAL VALUE ALERT  Critical value received:  Troponin 7.78  Date of notification:  10/12/2015  Time of notification:  0723  Critical value read back:YES  Nurse who received alert:  Johnsie CancelLisa Ponciano Shealy  MD notified (1st page):  Dr. Gwenlyn PerkingMadera  Time of first page:  [phone]

## 2015-10-12 NOTE — Progress Notes (Signed)
*  PRELIMINARY RESULTS* Echocardiogram 2D Echocardiogram has been performed with Definity.  Stacey DrainWhite, Debbera Wolken J 10/12/2015, 1:16 PM

## 2015-10-12 NOTE — Consult Note (Addendum)
[place] A. Merlene Laughter, MD     [email or web address]          [name] is an 75 y.o. female.   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 The degree of stenosis and the patient's demographic makes medical management the best option. Consequently, the patient will be started on dual antiplatelet agents for the next 3 months. Subsequently, she can replace a single agent preferably Plavix. As usual other risk factor modifications are suggestive including diabetes control, blood pressure control and the use of statin medication. The patient risk factors are age, hypertension, diabetes and dyslipidemia.  The patient is 75 year old white female who developed the acute onset of numbness and weakness involving the left upper extremity. The patient presented to the hospital about 5 hours after onset and therefore was not a candidate for any type of interventional therapy. She told me she had a stroke over a year ago also involving the left side and that has been on aspirin although low dose coated 81 mg aspirin. She reports that her symptoms have improved since being admitted to the hospital. She does not report having symptoms involving the left lower extremity. The right-sided asymptomatic. The patient denies new chest pain, shortness of breath or GI symptoms. She does not report dysarthria or dysphasia. The review of systems otherwise negative.    GENERAL: This is a pleasant female in no acute distress.  HEENT: Supple. Atraumatic normocephalic.   ABDOMEN: soft  EXTREMITIES: No edema   BACK: Normal.  SKIN: Normal by inspection.    MENTAL STATUS: Alert and oriented. Speech, language and cognition are generally intact. Judgment and insight normal.   CRANIAL NERVES: Pupils are equal, round and reactive to light and accommodation; extra ocular movements are full, there is no significant nystagmus;  visual fields are full; upper and lower facial muscles are normal in strength and symmetric, there is no flattening of the nasolabial folds; tongue is midline; uvula is midline; shoulder elevation is normal.  MOTOR: Mild weakness left upper extremity graded as 4+/5. Mild left upper extremity drift. No drift of the other extremities.  COORDINATION: Left finger to nose is normal, right finger to nose is normal, No rest tremor; no intention tremor; no postural tremor; no bradykinesia.  REFLEXES: Deep tendon reflexes are symmetrical and normal. Babinski reflexes are flexor bilaterally.   SENSATION: Slightly reduced sensation involving the left upper extremity. There is no extinction to double simultaneous tactile or visual stimulation.  NIH stroke scale two.   Blood pressure (!) 120/50, pulse 76, temperature 99.1 F (37.3 C), temperature source Oral, resp. rate 20, height '5\' 3"'$  (1.6 m), weight 214 lb 12.8 oz (97.4 kg), SpO2 97 %.  Past Medical History:  Diagnosis Date  . Anemia   . Anxiety   . Asthma   . Chronic respiratory failure ([place])   . COPD (chronic obstructive pulmonary disease) ([place])   . Coronary atherosclerosis of native coronary artery    a. history of MI with PTCA/stent to LAD 07/1998. b. Repeat PTCA to this lesion in 02/1999. c. PTCA/brachytherapy to mLAD in 2001  . DDD (degenerative disc disease), lumbar   . Essential hypertension, benign   . GERD (gastroesophageal reflux disease)   . Herpes zoster 06/2013  . History of bleeding ulcers   . Hyperlipidemia   . Myocardial infarction ([place]) 2000  . Obesity   . On home oxygen therapy   . OSA (obstructive  sleep apnea)   . Osteoporosis   . Takotsubo cardiomyopathy    April 2016  . Type 2 diabetes mellitus ([place])   . Venous insufficiency     Past Surgical History:  Procedure Laterality Date  . ALVEOLOPLASTY  10/1999   Archie Endo 07/19/2010 Surgical extraction of root segments #21, 22 and 27 with alveoloplasty.  Marland Kitchen CARDIAC  CATHETERIZATION  07/2012   + progressive CAD but no lesions to intervene upon, EF 65%; med mgmt continued  . CATARACT EXTRACTION W/ INTRAOCULAR LENS IMPLANT Right 2015?  Marland Kitchen CATARACT EXTRACTION W/PHACO Left 06/30/2012   Procedure: CATARACT EXTRACTION PHACO AND INTRAOCULAR LENS PLACEMENT (IOC);  Surgeon: Tonny Branch, MD;  Location: [facility];  Service: Ophthalmology;  Laterality: Left;  CDE:12.32  . CORONARY ANGIOPLASTY WITH STENT PLACEMENT     PTCA/BMS LAD 2000, PTCA/brachytherapy LAD 2001, LVEF 55%   . CORONARY ANGIOPLASTY WITH STENT PLACEMENT  2000; 2001; 2002   Archie Endo 05/18/2010  . LAPAROSCOPIC CHOLECYSTECTOMY    . LEFT HEART CATHETERIZATION WITH CORONARY ANGIOGRAM N/A 06/25/2014   Procedure: LEFT HEART CATHETERIZATION WITH CORONARY ANGIOGRAM;  Surgeon: Belva Crome, MD;  Location: [facility] CATH LAB;  Service: Cardiovascular;  Laterality: N/A;  . SHOULDER OPEN ROTATOR CUFF REPAIR Right   . TONSILLECTOMY  1949  . TOTAL ABDOMINAL HYSTERECTOMY     DUB (non-malignant reasons).  No hx of abnormal pap smears.  . TRANSTHORACIC ECHOCARDIOGRAM  06/2013   EF 40%, + WMA    Family History  Problem Relation Age of Onset  . Stroke Mother   . Heart attack Father   . Coronary artery disease Other   . Stroke Sister   . Stroke Brother   . Heart attack Brother     Social History:  reports that she quit smoking about 17 years ago. Her smoking use included Cigarettes. She has a 30.00 pack-year smoking history. She has never used smokeless tobacco. She reports that she does not drink alcohol or use drugs.  Allergies:  Allergies  Allergen Reactions  . Codeine Nausea And Vomiting    vomiting  . Terbutaline Sulfate     REACTION: INTOL to terbutaline    Medications: Prior to Admission medications   Medication Sig Start Date End Date Taking? Authorizing Provider  albuterol (PROAIR HFA) 108 (90 BASE) MCG/ACT inhaler Inhale 1-2 puffs into the lungs every 6 (six) hours as needed for wheezing or shortness of breath.  01/26/15  Yes Noralee Space, MD  albuterol (PROVENTIL) (2.5 MG/3ML) 0.083% nebulizer solution Take 3 mLs (2.5 mg total) by nebulization 4 (four) times daily. 03/18/15  Yes Noralee Space, MD  ALPRAZolam Duanne Moron) 0.5 MG tablet Take 1 tablet (0.5 mg total) by mouth 3 (three) times daily. 06/29/14  Yes Janece Canterbury, MD  amoxicillin-clavulanate (AUGMENTIN) 875-125 MG tablet Take 1 tablet by mouth 2 (two) times daily. Take all of this medication 10/05/15  Yes Claretta Fraise, MD  aspirin 81 MG tablet Take 81 mg by mouth daily.   Yes Historical Provider, MD  bisoprolol (ZEBETA) 5 MG tablet Take 0.5 tablets (2.5 mg total) by mouth daily. 10/05/15  Yes Claretta Fraise, MD  budesonide (PULMICORT) 0.25 MG/2ML nebulizer solution Take 2 mLs (0.25 mg total) by nebulization 2 (two) times daily. 05/02/15  Yes Noralee Space, MD  calcium-vitamin D (OSCAL WITH D) 500-200 MG-UNIT per tablet Take 1 tablet by mouth 2 (two) times daily.     Yes Historical Provider, MD  citalopram (CELEXA) 10 MG tablet Take 10 mg by mouth  daily.   Yes Historical Provider, MD  dextromethorphan-guaiFENesin (MUCINEX DM) 30-600 MG per 12 hr tablet Take 2 tablets by mouth every 12 (twelve) hours.     Yes Historical Provider, MD  Diphenhyd-Hydrocort-Nystatin (FIRST-DUKES MOUTHWASH) SUSP Gargle and swallow 1 tsp four times daily Patient taking differently: Take 5 mLs by mouth 4 (four) times daily. Gargle and swallow 1 tsp four times daily 05/12/15  Yes Noralee Space, MD  ferrous sulfate 325 (65 FE) MG tablet Take 325 mg by mouth daily with breakfast.    Yes Historical Provider, MD  fluticasone (FLONASE) 50 MCG/ACT nasal spray Place 1 spray into both nostrils 2 times daily at 12 noon and 4 pm.   Yes Historical Provider, MD  glucose blood (ONE TOUCH ULTRA TEST) test strip TEST BLOOD SUGAR LEVELS THREE TIMES DAILY 01/08/14  Yes Tammi Sou, MD  HYDROcodone-acetaminophen (NORCO/VICODIN) 5-325 MG tablet Take 1 tablet by mouth 2 (two) times daily. 10/05/15  Yes  Claretta Fraise, MD  ibandronate (BONIVA) 150 MG tablet TAKE 1 TABLET EVERY MONTH ON THE SAME DAY 06/04/14  Yes Tammi Sou, MD  insulin aspart (NOVOLOG FLEXPEN) 100 UNIT/ML FlexPen Inject 15 Units into the skin 3 (three) times daily before meals.   Yes Historical Provider, MD  insulin aspart protamine - aspart (NOVOLOG MIX 70/30 FLEXPEN) (70-30) 100 UNIT/ML FlexPen Inject 0.25 mLs (25 Units total) into the skin 2 (two) times daily with a meal. E11.8 Patient taking differently: Inject 30 Units into the skin 2 (two) times daily with a meal. E11.8 06/29/14  Yes Janece Canterbury, MD  ipratropium (ATROVENT) 0.02 % nebulizer solution Take 2.5 mLs (500 mcg total) by nebulization 4 (four) times daily. 05/02/15  Yes Noralee Space, MD  isosorbide mononitrate (IMDUR) 30 MG 24 hr tablet TAKE 1 TABLET DAILY 06/18/14  Yes Tammi Sou, MD  losartan (COZAAR) 50 MG tablet TAKE ONE TABLET BY MOUTH ONCE DAILY 06/04/14  Yes Tammi Sou, MD  Menthol, Topical Analgesic, (BENGAY PAIN RELIEF + MASSAGE) 2.5 % GEL Apply topically. Apply a small amount to affected area as needed   Yes Historical Provider, MD  metFORMIN (GLUCOPHAGE) 1000 MG tablet Take 1,000 mg by mouth 2 (two) times daily with a meal.   Yes Historical Provider, MD  Multiple Vitamin (MULTIVITAMIN) tablet Take 1 tablet by mouth daily.   Yes Historical Provider, MD  nitroGLYCERIN (NITROSTAT) 0.4 MG SL tablet Place 1 tablet (0.4 mg total) under the tongue every 5 (five) minutes as needed. May repeat x3 07/23/12  Yes Imogene Burn, PA-C  omeprazole (PRILOSEC) 20 MG capsule TAKE ONE CAPSULE BY MOUTH ONCE DAILY 30 MINUTES BEFORE A MEAL 10/05/15  Yes Claretta Fraise, MD  ONE [facility] LANCETS MISC Check blood sugars two times daily 10/21/12  Yes Noralee Space, MD  potassium chloride (K-DUR,KLOR-CON) 10 MEQ tablet TAKE 1 TABLET DAILY 09/28/14  Yes Tammi Sou, MD  Probiotic Product (PROBIOTIC DAILY PO) Take 1 tablet by mouth daily.    Yes Historical Provider, MD    sennosides-docusate sodium (SENOKOT-S) 8.6-50 MG tablet Take 1 tablet by mouth daily as needed for constipation.   Yes Historical Provider, MD  simvastatin (ZOCOR) 80 MG tablet Take 80 mg by mouth daily.   Yes Historical Provider, MD  sodium chloride (OCEAN) 0.65 % SOLN nasal spray Place 1 spray into both nostrils 2 (two) times daily as needed for congestion.   Yes Historical Provider, MD  torsemide (DEMADEX) 20 MG tablet Take 1 tablet (  20 mg total) by mouth daily. 10/05/15  Yes Claretta Fraise, MD  vitamin B-12 (CYANOCOBALAMIN) 1000 MCG tablet Take 1,000 mcg by mouth daily.   Yes Historical Provider, MD  vitamin C (ASCORBIC ACID) 500 MG tablet Take 500 mg by mouth daily.   Yes Historical Provider, MD    Scheduled Meds: . acidophilus  1 capsule Oral Daily  . ALPRAZolam  0.5 mg Oral TID  . atorvastatin  40 mg Oral q1800  . bisoprolol  2.5 mg Oral Daily  . budesonide  0.25 mg Nebulization BID  . calcium-vitamin D  1 tablet Oral BID  . citalopram  10 mg Oral Daily  . clopidogrel  75 mg Oral Daily  . enoxaparin (LOVENOX) injection  40 mg Subcutaneous Q24H  . ferrous sulfate  325 mg Oral Q breakfast  . fluticasone  1 spray Each Nare BID  . HYDROcodone-acetaminophen  1 tablet Oral BID  . insulin aspart  0-5 Units Subcutaneous QHS  . insulin aspart  0-9 Units Subcutaneous TID WC  . insulin aspart protamine- aspart  30 Units Subcutaneous BID WC  . ipratropium-albuterol  3 mL Nebulization QID  . isosorbide mononitrate  30 mg Oral Daily  . losartan  50 mg Oral Daily  . magic mouthwash  5 mL Oral QID  . multivitamin with minerals  1 tablet Oral Daily  . pantoprazole  40 mg Oral Daily  . torsemide  20 mg Oral Daily  . vitamin B-12  1,000 mcg Oral Daily   Continuous Infusions:  PRN Meds:.nitroGLYCERIN, senna-docusate, sodium chloride     Results for orders placed or performed during the hospital encounter of 10/11/15 (from the past 48 hour(s))  Ethanol     Status: None   Collection Time:  10/11/15  6:56 PM  Result Value Ref Range   Alcohol, Ethyl (B) <5 <5 mg/dL    Comment:        LOWEST DETECTABLE LIMIT FOR SERUM ALCOHOL IS 5 mg/dL FOR MEDICAL PURPOSES ONLY   Protime-INR     Status: None   Collection Time: 10/11/15  6:56 PM  Result Value Ref Range   Prothrombin Time 13.0 11.4 - 15.2 seconds   INR 0.99   APTT     Status: None   Collection Time: 10/11/15  6:56 PM  Result Value Ref Range   aPTT 24 24 - 36 seconds  CBC     Status: Abnormal   Collection Time: 10/11/15  6:56 PM  Result Value Ref Range   WBC 13.7 (H) 4.0 - 10.5 K/uL   RBC 3.83 (L) 3.87 - 5.11 MIL/uL   Hemoglobin 11.8 (L) 12.0 - 15.0 g/dL   HCT 36.0 36.0 - 46.0 %   MCV 94.0 78.0 - 100.0 fL   MCH 30.8 26.0 - 34.0 pg   MCHC 32.8 30.0 - 36.0 g/dL   RDW 12.1 11.5 - 15.5 %   Platelets 208 150 - 400 K/uL  Differential     Status: Abnormal   Collection Time: 10/11/15  6:56 PM  Result Value Ref Range   Neutrophils Relative % 85 %   Neutro Abs 11.6 (H) 1.7 - 7.7 K/uL   Lymphocytes Relative 7 %   Lymphs Abs 1.0 0.7 - 4.0 K/uL   Monocytes Relative 8 %   Monocytes Absolute 1.1 (H) 0.1 - 1.0 K/uL   Eosinophils Relative 0 %   Eosinophils Absolute 0.0 0.0 - 0.7 K/uL   Basophils Relative 0 %   Basophils Absolute 0.0 0.0 -  0.1 K/uL  Comprehensive metabolic panel     Status: Abnormal   Collection Time: 10/11/15  6:56 PM  Result Value Ref Range   Sodium 137 135 - 145 mmol/L   Potassium 4.4 3.5 - 5.1 mmol/L   Chloride 97 (L) 101 - 111 mmol/L   CO2 32 22 - 32 mmol/L   Glucose, Bld 270 (H) 65 - 99 mg/dL   BUN 22 (H) 6 - 20 mg/dL   Creatinine, Ser 1.52 (H) 0.44 - 1.00 mg/dL   Calcium 9.3 8.9 - 10.3 mg/dL   Total Protein 6.8 6.5 - 8.1 g/dL   Albumin 3.8 3.5 - 5.0 g/dL   AST 69 (H) 15 - 41 U/L   ALT 35 14 - 54 U/L   Alkaline Phosphatase 63 38 - 126 U/L   Total Bilirubin 0.4 0.3 - 1.2 mg/dL   GFR calc non Af Amer 32 (L) >60 mL/min   GFR calc Af Amer 38 (L) >60 mL/min    Comment: (NOTE) The eGFR has been  calculated using the CKD EPI equation. This calculation has not been validated in all clinical situations. eGFR's persistently <60 mL/min signify possible Chronic Kidney Disease.    Anion gap 8 5 - 15  Troponin I     Status: Abnormal   Collection Time: 10/11/15  6:56 PM  Result Value Ref Range   Troponin I 9.95 (HH) <0.03 ng/mL    Comment: CRITICAL RESULT CALLED TO, READ BACK BY AND VERIFIED WITH: LASHELY S AT 2015 ON [id number] BY FORSYTH K   CBG monitoring, ED     Status: Abnormal   Collection Time: 10/11/15  7:01 PM  Result Value Ref Range   Glucose-Capillary 246 (H) 65 - 99 mg/dL  I-stat troponin, ED (not at [facility], [facility])     Status: Abnormal   Collection Time: 10/11/15  7:12 PM  Result Value Ref Range   Troponin i, poc 7.47 (HH) 0.00 - 0.08 ng/mL   Comment MD NOTIFIED, REPEAT TEST    Comment 3            Comment: Due to the release kinetics of cTnI, a negative result within the first hours of the onset of symptoms does not rule out myocardial infarction with certainty. If myocardial infarction is still suspected, repeat the test at appropriate intervals.   I-Stat Chem 8, ED  (not at [facility], [facility])     Status: Abnormal   Collection Time: 10/11/15  7:14 PM  Result Value Ref Range   Sodium 140 135 - 145 mmol/L   Potassium 4.3 3.5 - 5.1 mmol/L   Chloride 94 (L) 101 - 111 mmol/L   BUN 21 (H) 6 - 20 mg/dL   Creatinine, Ser 1.40 (H) 0.44 - 1.00 mg/dL   Glucose, Bld 259 (H) 65 - 99 mg/dL   Calcium, Ion 1.23 1.12 - 1.23 mmol/L   TCO2 33 0 - 100 mmol/L   Hemoglobin 11.9 (L) 12.0 - 15.0 g/dL   HCT 35.0 (L) 36.0 - 46.0 %  Urine rapid drug screen (hosp performed)not at [facility]     Status: Abnormal   Collection Time: 10/11/15  8:00 PM  Result Value Ref Range   Opiates POSITIVE (A) NONE DETECTED   Cocaine NONE DETECTED NONE DETECTED   Benzodiazepines POSITIVE (A) NONE DETECTED   Amphetamines NONE DETECTED NONE DETECTED   Tetrahydrocannabinol NONE DETECTED NONE DETECTED   Barbiturates NONE  DETECTED NONE DETECTED    Comment:  DRUG SCREEN FOR MEDICAL PURPOSES ONLY.  IF CONFIRMATION IS NEEDED FOR ANY PURPOSE, NOTIFY LAB WITHIN 5 DAYS.        LOWEST DETECTABLE LIMITS FOR URINE DRUG SCREEN Drug Class       Cutoff (ng/mL) Amphetamine      1000 Barbiturate      200 Benzodiazepine   481 Tricyclics       856 Opiates          300 Cocaine          300 THC              50   Urinalysis, Routine w reflex microscopic (not at [facility])     Status: Abnormal   Collection Time: 10/11/15  8:00 PM  Result Value Ref Range   Color, Urine YELLOW YELLOW   APPearance CLEAR CLEAR   Specific Gravity, Urine 1.020 1.005 - 1.030   pH 5.5 5.0 - 8.0   Glucose, UA >1000 (A) NEGATIVE mg/dL   Hgb urine dipstick TRACE (A) NEGATIVE   Bilirubin Urine NEGATIVE NEGATIVE   Ketones, ur TRACE (A) NEGATIVE mg/dL   Protein, ur NEGATIVE NEGATIVE mg/dL   Nitrite NEGATIVE NEGATIVE   Leukocytes, UA NEGATIVE NEGATIVE  Urine microscopic-add on     Status: Abnormal   Collection Time: 10/11/15  8:00 PM  Result Value Ref Range   Squamous Epithelial / LPF 6-30 (A) NONE SEEN   WBC, UA 0-5 0 - 5 WBC/hpf   RBC / HPF 0-5 0 - 5 RBC/hpf   Bacteria, UA RARE (A) NONE SEEN  Troponin I     Status: None   Collection Time: 10/12/15 12:53 AM  Result Value Ref Range   Troponin I  <0.03 ng/mL    CRITICAL VALUE NOTED.  VALUE IS CONSISTENT WITH PREVIOUSLY REPORTED AND CALLED VALUE.  Lipid panel     Status: Abnormal   Collection Time: 10/12/15  6:08 AM  Result Value Ref Range   Cholesterol 146 0 - 200 mg/dL   Triglycerides 265 (H) <150 mg/dL   HDL 32 (L) >40 mg/dL   Total CHOL/HDL Ratio 4.6 RATIO   VLDL 53 (H) 0 - 40 mg/dL   LDL Cholesterol 61 0 - 99 mg/dL    Comment:        Total Cholesterol/HDL:CHD Risk Coronary Heart Disease Risk Table                     Men   Women  1/2 Average Risk   3.4   3.3  Average Risk       5.0   4.4  2 X Average Risk   9.6   7.1  3 X Average Risk  23.4   11.0        Use the  calculated Patient Ratio above and the CHD Risk Table to determine the patient's CHD Risk.        ATP III CLASSIFICATION (LDL):  <100     mg/dL   Optimal  100-129  mg/dL   Near or Above                    Optimal  130-159  mg/dL   Borderline  160-189  mg/dL   High  >190     mg/dL   Very High   Troponin I     Status: Abnormal   Collection Time: 10/12/15  6:08 AM  Result Value Ref Range   Troponin I 7.78 (HH) <0.03  ng/mL    Comment: CRITICAL RESULT CALLED TO, READ BACK BY AND VERIFIED WITH: BULLINS,L AT 7:20AM ON 10/12/15 BY FESTERMAN,C   Glucose, capillary     Status: Abnormal   Collection Time: 10/12/15  8:24 AM  Result Value Ref Range   Glucose-Capillary 271 (H) 65 - 99 mg/dL  Glucose, capillary     Status: Abnormal   Collection Time: 10/12/15 11:27 AM  Result Value Ref Range   Glucose-Capillary 268 (H) 65 - 99 mg/dL  Glucose, capillary     Status: Abnormal   Collection Time: 10/12/15  4:11 PM  Result Value Ref Range   Glucose-Capillary 225 (H) 65 - 99 mg/dL   Comment 1 Notify RN    Comment 2 Document in Chart     Studies/Results:   CAROTID DOPPLERS 1. Heavily calcified bilateral carotid bifurcation and proximal ICA plaque, resulting in 50-69% diameter stenosis bilaterally. The exam does not exclude plaque ulceration or embolization. Continued surveillance recommended.    TTE - Left ventricle: The cavity size was normal. Wall thickness was   increased in a pattern of moderate LVH. Systolic function was   normal. The estimated ejection fraction was in the range of 60%   to 65%. Wall motion was normal; there were no regional wall   motion abnormalities. Doppler parameters are consistent with   abnormal left ventricular relaxation (grade 1 diastolic   dysfunction). - Aortic valve: Mildly calcified annulus. Trileaflet; mildly   thickened leaflets. Valve area (VTI): 1.91 cm^2. Valve area   (Vmax): 1.97 cm^2. Valve area (Vmean): 1.7 cm^2. - Mitral valve: Mildly  calcified annulus. Mildly thickened leaflets   . There was mild regurgitation. - Left atrium: The atrium was mildly dilated. - Atrial septum: Poorly visualized. A patent foramen ovale cannot   be excluded. - Technically difficult study. Echocontrast was used to enhance   visualization.    BRAIN MRA FINDINGS: Image quality degraded by intracranial vessel tortuosity. There is decreased signal in the posterior cerebral arteries due to tortuosity. There is also artifact over the MCA branch vessels.  Both vertebral arteries patent to the basilar. Left PICA patent. Right PICA not visualized. Basilar patent. Superior cerebellar arteries are patent. Proximal posterior cerebral arteries are patent. Mid and distal posterior cerebral arteries not visualized due to tortuosity  Cavernous carotid widely patent bilaterally. Anterior and middle cerebral arteries are patent without significant stenosis. Distal branches not well visualized due to artifact.  IMPRESSION: Exam quality degraded due to artifact and tortuosity.  Proximal great vessels in the circle of Willis are widely patent without stenosis.    BRAIN MRA FINDINGS: Both vertebral arteries widely patent to the basilar without stenosis  Tortuosity of the proximal internal carotid artery bilaterally causing signal loss. No evidence of dissection.  Mild irregularity in the right carotid bifurcation with mild stenosis of the right internal and external carotid arteries. Left carotid bifurcation widely patent.  IMPRESSION: Tortuosity of the proximal internal carotid artery limits resolution  There appears to be mild stenosis at the origin of the right internal and external carotid arteries.  Left carotid widely patent.  Both vertebral arteries widely patent.    BRAIN MRI FINDINGS: Acute infarct in the right posterior parietal lobe extending near the occipital lobe. This is likely all in the posterior right  MCA territory. Small amount of associated hemorrhage in the posterior portion of the infarct. Based on prior CT is also a small area of chronic infarct in the right parietal cortex in this area. No  other areas of acute infarct  Basal ganglia and brainstem intact. Mild chronic microvascular ischemic changes in the white matter elsewhere.  Negative for mass or edema.  No shift of the midline structures  Generalized atrophy.  Circle of Willis appears patent.  Mild mucosal edema in the paranasal sinuses. Bilateral lens replacement. 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  IMPRESSION: Scattered small areas of acute infarct with small amount of hemorrhage in the right parietal lobe. There is also a small area of chronic infarction in this area.           Viriginia Amendola A. Merlene Laughter, M.D.  Diplomate, Tax adviser of Psychiatry and Neurology ( Neurology). 10/12/2015, 4:56 PM

## 2015-10-12 NOTE — Consult Note (Signed)
Cardiology Consultation   Patient ID: [name]; [id number]; 29-Jul-1940   Admit date: 10/11/2015 Date of Consult: 10/12/2015  Referring MD: Dr. Gwenlyn Perking Cardiologist: Dr. Diona Browner Consulting Cardiologist: Dr. Javi Bollman  Patient Care Team: Toma Deiters, MD as PCP - General (Internal Medicine) Jonelle Sidle, MD (Cardiology)    Reason for Consultation: CVA elevated troponins History of Present Illness: Margaret GAVRIELLE Becker is a 75 y.o. female with a hx of  with history of CAD status post MI with stenting of the LAD in 2000, repeat PTCA to this lesion in 02/1999, PTCA/brachytherapy to the mid LAD in 2001. Last cath in 2016 showed moderate LAD disease and a severely diseased small nondominant RCA treated medically. History of stress-induced cardiomyopathy with improvement in LVEF to 50-55% on echo 01/2015, hypertension, hyperlipidemia, 2 previous CVAs, DM, CK D stage III, and morbid obesity.  Patient presents now with the acute CVA with left upper extremity weakness lasting 5 hours. CT did not reveal any acute change. Troponin was greater than 9 but no chest pain or diaphoresis. Has chronic dyspnea on home O2. Lives alone. Denies any chest pain, tightness or pressure.  Past Medical History:  Diagnosis Date  . Anemia   . Anxiety   . Asthma   . Chronic respiratory failure (HCC)   . COPD (chronic obstructive pulmonary disease) (HCC)   . Coronary atherosclerosis of native coronary artery    a. history of MI with PTCA/stent to LAD 07/1998. b. Repeat PTCA to this lesion in 02/1999. c. PTCA/brachytherapy to mLAD in 2001  . DDD (degenerative disc disease), lumbar   . Essential hypertension, benign   . GERD (gastroesophageal reflux disease)   . Herpes zoster 06/2013  . History of bleeding ulcers   . Hyperlipidemia   . Myocardial infarction (HCC) 2000  . Obesity   . On home oxygen therapy   . OSA (obstructive sleep apnea)   . Osteoporosis   . Takotsubo cardiomyopathy    April 2016  . Type 2  diabetes mellitus (HCC)   . Venous insufficiency     Past Surgical History:  Procedure Laterality Date  . ALVEOLOPLASTY  10/1999   [name] 07/19/2010 Surgical extraction of root segments #21, 22 and 27 with alveoloplasty.  Marland Kitchen CARDIAC CATHETERIZATION  07/2012   + progressive CAD but no lesions to intervene upon, EF 65%; med mgmt continued  . CATARACT EXTRACTION W/ INTRAOCULAR LENS IMPLANT Right 2015?  Marland Kitchen CATARACT EXTRACTION W/PHACO Left 06/30/2012   Procedure: CATARACT EXTRACTION PHACO AND INTRAOCULAR LENS PLACEMENT (IOC);  Surgeon: Gemma Payor, MD;  Location: [facility];  Service: Ophthalmology;  Laterality: Left;  CDE:12.32  . CORONARY ANGIOPLASTY WITH STENT PLACEMENT     PTCA/BMS LAD 2000, PTCA/brachytherapy LAD 2001, LVEF 55%   . CORONARY ANGIOPLASTY WITH STENT PLACEMENT  2000; 2001; 2002   [name] 05/18/2010  . LAPAROSCOPIC CHOLECYSTECTOMY    . LEFT HEART CATHETERIZATION WITH CORONARY ANGIOGRAM N/A 06/25/2014   Procedure: LEFT HEART CATHETERIZATION WITH CORONARY ANGIOGRAM;  Surgeon: Lyn Records, MD;  Location: [facility] CATH LAB;  Service: Cardiovascular;  Laterality: N/A;  . SHOULDER OPEN ROTATOR CUFF REPAIR Right   . TONSILLECTOMY  1949  . TOTAL ABDOMINAL HYSTERECTOMY     DUB (non-malignant reasons).  No hx of abnormal pap smears.  . TRANSTHORACIC ECHOCARDIOGRAM  06/2013   EF 40%, + WMA      Home Meds: Prior to Admission medications   Medication Sig Start Date End Date Taking? Authorizing Provider  albuterol [facility] HFA)  108 (90 BASE) MCG/ACT inhaler Inhale 1-2 puffs into the lungs every 6 (six) hours as needed for wheezing or shortness of breath. 01/26/15  Yes Michele Mcalpine, MD  albuterol (PROVENTIL) (2.5 MG/3ML) 0.083% nebulizer solution Take 3 mLs (2.5 mg total) by nebulization 4 (four) times daily. 03/18/15  Yes Michele Mcalpine, MD  ALPRAZolam Prudy Feeler) 0.5 MG tablet Take 1 tablet (0.5 mg total) by mouth 3 (three) times daily. 06/29/14  Yes Renae Fickle, MD  amoxicillin-clavulanate (AUGMENTIN)  875-125 MG tablet Take 1 tablet by mouth 2 (two) times daily. Take all of this medication 10/05/15  Yes Mechele Claude, MD  aspirin 81 MG tablet Take 81 mg by mouth daily.   Yes Historical Provider, MD  bisoprolol (ZEBETA) 5 MG tablet Take 0.5 tablets (2.5 mg total) by mouth daily. 10/05/15  Yes Mechele Claude, MD  budesonide (PULMICORT) 0.25 MG/2ML nebulizer solution Take 2 mLs (0.25 mg total) by nebulization 2 (two) times daily. 05/02/15  Yes Michele Mcalpine, MD  calcium-vitamin D (OSCAL WITH D) 500-200 MG-UNIT per tablet Take 1 tablet by mouth 2 (two) times daily.     Yes Historical Provider, MD  citalopram (CELEXA) 10 MG tablet Take 10 mg by mouth daily.   Yes Historical Provider, MD  dextromethorphan-guaiFENesin (MUCINEX DM) 30-600 MG per 12 hr tablet Take 2 tablets by mouth every 12 (twelve) hours.     Yes Historical Provider, MD  Diphenhyd-Hydrocort-Nystatin (FIRST-DUKES MOUTHWASH) SUSP Gargle and swallow 1 tsp four times daily Patient taking differently: Take 5 mLs by mouth 4 (four) times daily. Gargle and swallow 1 tsp four times daily 05/12/15  Yes Michele Mcalpine, MD  ferrous sulfate 325 (65 FE) MG tablet Take 325 mg by mouth daily with breakfast.    Yes Historical Provider, MD  fluticasone (FLONASE) 50 MCG/ACT nasal spray Place 1 spray into both nostrils 2 times daily at 12 noon and 4 pm.   Yes Historical Provider, MD  HYDROcodone-acetaminophen (NORCO/VICODIN) 5-325 MG tablet Take 1 tablet by mouth 2 (two) times daily. 10/05/15  Yes Mechele Claude, MD  ibandronate (BONIVA) 150 MG tablet TAKE 1 TABLET EVERY MONTH ON THE SAME DAY 06/04/14  Yes Jeoffrey Massed, MD  insulin aspart protamine - aspart (NOVOLOG MIX 70/30 FLEXPEN) (70-30) 100 UNIT/ML FlexPen Inject 0.25 mLs (25 Units total) into the skin 2 (two) times daily with a meal. E11.8 Patient taking differently: Inject 30 Units into the skin 2 (two) times daily with a meal. E11.8 06/29/14  Yes Renae Fickle, MD  ipratropium (ATROVENT) 0.02 % nebulizer  solution Take 2.5 mLs (500 mcg total) by nebulization 4 (four) times daily. 05/02/15  Yes Michele Mcalpine, MD  isosorbide mononitrate (IMDUR) 30 MG 24 hr tablet TAKE 1 TABLET DAILY 06/18/14  Yes Jeoffrey Massed, MD  losartan (COZAAR) 50 MG tablet TAKE ONE TABLET BY MOUTH ONCE DAILY 06/04/14  Yes Jeoffrey Massed, MD  metFORMIN (GLUCOPHAGE) 1000 MG tablet Take 1,000 mg by mouth 2 (two) times daily with a meal.   Yes Historical Provider, MD  Multiple Vitamin (MULTIVITAMIN) tablet Take 1 tablet by mouth daily.   Yes Historical Provider, MD  nitroGLYCERIN (NITROSTAT) 0.4 MG SL tablet Place 1 tablet (0.4 mg total) under the tongue every 5 (five) minutes as needed. May repeat x3 07/23/12  Yes Dyann Kief, PA-C  omeprazole (PRILOSEC) 20 MG capsule TAKE ONE CAPSULE BY MOUTH ONCE DAILY 30 MINUTES BEFORE A MEAL 10/05/15  Yes Mechele Claude, MD  potassium chloride (K-DUR,KLOR-CON)  10 MEQ tablet TAKE 1 TABLET DAILY 09/28/14  Yes Jeoffrey MassedPhilip H McGowen, MD  Probiotic Product (PROBIOTIC DAILY PO) Take 1 tablet by mouth daily.    Yes Historical Provider, MD  simvastatin (ZOCOR) 80 MG tablet Take 80 mg by mouth daily.   Yes Historical Provider, MD  sodium chloride (OCEAN) 0.65 % SOLN nasal spray Place 1 spray into both nostrils 2 (two) times daily as needed for congestion.   Yes Historical Provider, MD  torsemide (DEMADEX) 20 MG tablet Take 1 tablet (20 mg total) by mouth daily. 10/05/15  Yes Mechele ClaudeWarren Stacks, MD  vitamin B-12 (CYANOCOBALAMIN) 1000 MCG tablet Take 1,000 mcg by mouth daily.   Yes Historical Provider, MD  vitamin C (ASCORBIC ACID) 500 MG tablet Take 500 mg by mouth daily.   Yes Historical Provider, MD  glucose blood (ONE TOUCH ULTRA TEST) test strip TEST BLOOD SUGAR LEVELS THREE TIMES DAILY 01/08/14   Jeoffrey MassedPhilip H McGowen, MD  Menthol, Topical Analgesic, (BENGAY PAIN RELIEF + MASSAGE) 2.5 % GEL Apply topically. Apply a small amount to affected area as needed    Historical Provider, MD  ONE TOUCH LANCETS MISC Check blood  sugars two times daily 10/21/12   Michele McalpineScott M Nadel, MD  sennosides-docusate sodium (SENOKOT-S) 8.6-50 MG tablet Take 1 tablet by mouth daily as needed for constipation.    Historical Provider, MD  TRUEPLUS INSULIN SYRINGE 30G X 5/16" 0.5 ML MISC USE 3 TIMES A DAY AS DIRECTED 03/24/14   Jeoffrey MassedPhilip H McGowen, MD    Current Medications: .  stroke: mapping our early stages of recovery book   Does not apply Once  . acidophilus  1 capsule Oral Daily  . ALPRAZolam  0.5 mg Oral TID  . aspirin  325 mg Oral Daily  . atorvastatin  40 mg Oral q1800  . bisoprolol  2.5 mg Oral Daily  . budesonide  0.25 mg Nebulization BID  . calcium-vitamin D  1 tablet Oral BID  . citalopram  10 mg Oral Daily  . enoxaparin (LOVENOX) injection  40 mg Subcutaneous Q24H  . ferrous sulfate  325 mg Oral Q breakfast  . fluticasone  1 spray Each Nare BID  . HYDROcodone-acetaminophen  1 tablet Oral BID  . insulin aspart  0-5 Units Subcutaneous QHS  . insulin aspart  0-9 Units Subcutaneous TID WC  . insulin aspart protamine- aspart  30 Units Subcutaneous BID WC  . ipratropium-albuterol  3 mL Nebulization Q6H  . isosorbide mononitrate  30 mg Oral Daily  . losartan  50 mg Oral Daily  . magic mouthwash  5 mL Oral QID  . multivitamin with minerals  1 tablet Oral Daily  . pantoprazole  40 mg Oral Daily  . torsemide  20 mg Oral Daily  . vitamin B-12  1,000 mcg Oral Daily     Allergies:    Allergies  Allergen Reactions  . Codeine Nausea And Vomiting    vomiting  . Terbutaline Sulfate     REACTION: INTOL to terbutaline    Social History:   The patient  reports that she quit smoking about 17 years ago. Her smoking use included Cigarettes. She has a 30.00 pack-year smoking history. She has never used smokeless tobacco. She reports that she does not drink alcohol or use drugs.    Family History:   The patient's family history includes Coronary artery disease in her other; Heart attack in her brother and father; Stroke in her  brother, mother, and sister.   ROS:  Please see the history of present illness.  Review of Systems  Constitution: Positive for weakness.  HENT: Negative.   Eyes: Negative.   Cardiovascular: Positive for dyspnea on exertion.  Respiratory: Positive for cough, shortness of breath and wheezing.   Hematologic/Lymphatic: Negative.   Musculoskeletal: Positive for muscle weakness. Negative for joint pain.  Gastrointestinal: Negative.   Genitourinary: Negative.   Neurological: Positive for focal weakness.   All other ROS reviewed and negative.      Vital Signs: Blood pressure 132/77, pulse (!) 102, temperature 98.2 F (36.8 C), temperature source Oral, resp. rate (!) 22, height 5\' 3"  (1.6 m), weight 214 lb 12.8 oz (97.4 kg), SpO2 94 %.   PHYSICAL EXAM: General:  Well nourished, well developed, in no acute distress  HEENT: normal Lymph: no adenopathy Neck: no JVD Endocrine:  No thryomegaly Vascular: No carotid bruits; FA pulses 2+ bilaterally without bruits  Cardiac:  RRR; normal S1, S2; S4 no murmur, rub, bruit, thrill, or heave Lungs: Decreased breath sounds with scattered wheezes Abd: soft, nontender, no hepatomegaly  Ext: no edema, Good distal pulses bilaterally Musculoskeletal:  No deformities, BUE and BLE strength normal and equal Skin: warm and dry  Neuro:  Left arm weakness Psych:  Normal affect    EKG:  Normal sinus rhythm with old anterior infarct/IVCD, no acute change  Telemetry: NSR   Labs:  Recent Labs  10/11/15 1856 10/12/15 0053 10/12/15 0608  TROPONINI 9.95* CRITICAL VALUE NOTED.  VALUE IS CONSISTENT WITH PREVIOUSLY REPORTED AND CALLED VALUE. 7.78*   Lab Results  Component Value Date   WBC 13.7 (H) 10/11/2015   HGB 11.9 (L) 10/11/2015   HCT 35.0 (L) 10/11/2015   MCV 94.0 10/11/2015   PLT 208 10/11/2015    Recent Labs Lab 10/11/15 1856 10/11/15 1914  NA 137 140  K 4.4 4.3  CL 97* 94*  CO2 32  --   BUN 22* 21*  CREATININE 1.52* 1.40*  CALCIUM  9.3  --   PROT 6.8  --   BILITOT 0.4  --   ALKPHOS 63  --   ALT 35  --   AST 69*  --   GLUCOSE 270* 259*   Lab Results  Component Value Date   CHOL 146 10/12/2015   HDL 32 (L) 10/12/2015   LDLCALC 61 10/12/2015   TRIG 265 (H) 10/12/2015   Lab Results  Component Value Date   DDIMER  08/29/2006    0.24        AT THE INHOUSE ESTABLISHED CUTOFF VALUE OF 0.48 ug/mL FEU, THIS ASSAY HAS BEEN DOCUMENTED IN THE LITERATURE TO HAVE    Radiology/Studies:  Dg Chest 2 View  Result Date: 10/05/2015 CLINICAL DATA:  Congestion. EXAM: CHEST  2 VIEW COMPARISON:  Radiographs of Jul 12, 2015. FINDINGS: Stable cardiomegaly. Atherosclerosis of thoracic aorta is noted. No pneumothorax or pleural effusion is noted. Multilevel degenerative disc disease is noted in the thoracic spine. Stable minimal right basilar scarring is noted. Mild lingular subsegmental atelectasis is noted. IMPRESSION: Aortic atherosclerosis.  Mild lingular subsegmental atelectasis. Electronically Signed   By: Lupita Raider, M.D.   On: 10/05/2015 14:19   Ct Head Wo Contrast  Addendum Date: 10/11/2015   ADDENDUM REPORT: 10/11/2015 19:07 ADDENDUM: These results were called by telephone at the time of interpretation on 10/11/2015 at 7:04 pm to Dr. Jacalyn Lefevre , who verbally acknowledged these results. Electronically Signed   By: Gaylyn Rong M.D.   On: 10/11/2015 19:07  Result Date: 10/11/2015 CLINICAL DATA:  Left arm weakness starting at 4 p.m. today. Diabetes and hypertension. EXAM: CT HEAD WITHOUT CONTRAST TECHNIQUE: Contiguous axial images were obtained from the base of the skull through the vertex without intravenous contrast. COMPARISON:  10/31/2014 FINDINGS: Minimal chronic encephalomalacia in the right parietal region, not appreciably changed compared to 10/03/14. Faint linear hypodensity in the right occipital lobe, images 14-15 series 2, also not appreciably changed from 2016 exams. Otherwise, the brainstem, cerebellum,  cerebral peduncles, thalami, basal ganglia, basilar cisterns, and ventricular system appear within normal limits. No intracranial hemorrhage, mass lesion, or acute CVA. Mild chronic ethmoid sinusitis. IMPRESSION: 1. No acute intracranial findings. 2. Linear hypodensities in the right occipital and right parietal lobes possibly from remote small infarcts. Not appreciably changed from 10/03/2014. 3. Mild chronic ethmoid sinusitis. Electronically Signed: By: Gaylyn Rong M.D. On: 10/11/2015 19:01    Echocardiogram with Definity contrast 01/07/2015: Study Conclusions   - This is a limited study with echocontrast to evaluate LVEF. - Left ventricle: Systolic function was normal. The estimated   ejection fraction was in the range of 50% to 55%. Wall motion was   normal; there were no regional wall motion abnormalities.   Cardiac catheterization 06/25/2014: Left mainstem: The left main is patent with mild 20-30% distal left mainstem stenosis. The vessel divides into the LAD and left circumflex.   Left anterior descending (LAD): The LAD is patent. There is moderate proximal LAD stenosis just before the stented segment with associated calcification. I would estimate this at 60%. There is also moderate in-stent restenosis of 50-60% in the proximal portion of the stented segment in the mid LAD. The remainder of the stent has mild diffuse 30% in-stent restenosis. The mid and distal LAD have diffuse disease with at most 75% stenosis. Diagonal branches are patent.   Left circumflex (LCx): The left circumflex is patent. There is a small intermediate Ric Rosenberg. The obtuse marginal branches are patent without significant stenoses. There is mild irregularity throughout the circumflex distribution. The left circumflex is dominant.   Right coronary artery (RCA): Small, nondominant vessel. There is an 80% stenosis in the proximal RCA which is unchanged from the previous study. This vessel only supplies the RV marginal  branches.   Left ventriculography: Deferred    Estimated Blood Loss: Minimal   Final Conclusions:   Stable 2 vessel coronary artery disease compared to previous cardiac catheterization from 2014. There is severe stenosis of a small nondominant right coronary artery which should be managed medically, and moderate stenosis of the LAD with no progression of disease compared to the previous study    PROBLEM LIST:  Active Problems:   CVA (cerebral infarction)   Acute CVA (cerebrovascular accident) (HCC)     ASSESSMENT AND PLAN:  1. Acute CVA: CT negative,   MRI pending. No history of afib  2. Elevated troponins without EKG changes or chest pain probably secondary to CVA. No further workup at this time.  3. History of CAD status post interventions to the LAD in the past. Last cath 2016 with moderate LAD disease and severely diseased small nondominant RCA treated medically.  4. History of stress-induced cardiomyopathy with improvement in LVEF to 50-55% 01/2015  5. Hypertension controlled  6. HLD  7. CKD stage III  8. Previous CVAs   Signed, Jacolyn Reedy, PA-C  10/12/2015 7:56 AM    Patient seen and discussed with PA Geni Bers, I agree with her documentation above. 75 yo female with history of CAD  with previous stenting, COPD, HL, HTN, DM2, Takotsubo CM, presented with left sided weakness. A troponin was checked and found to be elevated, cardiology was consulted.  Hgb 11.8, Plt 208, K 4.4, Cr 1.52, GFR 32 Trop peak 9.95, trending down. EKG: SR, LAFB. No acute ischemic changes   Elevated troponin in the setting of no cardiopulmonary symptoms, patient presented with stroke symptoms. EKG stable,echo is pending. MRI/MRA shows scattered areas of acute infarct with small amount of hemorrhage. At the time the exact cause of her elevated troponin is unclear. In absence of symptoms, with significant CKD as well as small brain hemorrhage fairly limited on cardiac testing or interventions.  We will f/u echo results at this time. With presentation of significant troponin elevation and stroke symptoms must at least consider aortic dissection, though she has not significant chest pain and MRI with small scattered areas of infarct, would think if she dissected would be a large consolidated area of infarct. . F/u echo and other stroke workup, could consider CTA aorta pending initial workup though evidence thus far not overally suggestive. Of note admission 06/2014 with troponin to 20, cath without obstructive CAD, thought possible stress induced CM.    Dominga Ferry MD

## 2015-10-12 NOTE — Progress Notes (Signed)
PT Cancellation Note  Patient Details Name: [name] MRN: [id number] DOB: 05/16/1940   Cancelled Treatment:    Reason Eval/Treat Not Completed: Patient not medically ready (Pt noted to have critically elevated troponin.  Peak troponin was yesterday 08/09 at 9.95, and now down to 7.78.  However, pt was also recently diagnosed with NSTEMI on 10/05/2015.  Therefore, will hold PT evaluation until troponin shows a consistent trend downward.  PT will check back tomorrow.)   Beth Landrey Mahurin, PT, DPT X: [phone]

## 2015-10-13 DIAGNOSIS — I214 Non-ST elevation (NSTEMI) myocardial infarction: Secondary | ICD-10-CM

## 2015-10-13 DIAGNOSIS — J9611 Chronic respiratory failure with hypoxia: Secondary | ICD-10-CM

## 2015-10-13 DIAGNOSIS — R7989 Other specified abnormal findings of blood chemistry: Secondary | ICD-10-CM

## 2015-10-13 DIAGNOSIS — E1165 Type 2 diabetes mellitus with hyperglycemia: Secondary | ICD-10-CM

## 2015-10-13 DIAGNOSIS — R778 Other specified abnormalities of plasma proteins: Secondary | ICD-10-CM

## 2015-10-13 DIAGNOSIS — N183 Chronic kidney disease, stage 3 (moderate): Secondary | ICD-10-CM

## 2015-10-13 DIAGNOSIS — I480 Paroxysmal atrial fibrillation: Secondary | ICD-10-CM | POA: Diagnosis not present

## 2015-10-13 DIAGNOSIS — Z794 Long term (current) use of insulin: Secondary | ICD-10-CM

## 2015-10-13 LAB — HEMOGLOBIN A1C
Hgb A1c MFr Bld: 6.7 % — ABNORMAL HIGH (ref 4.8–5.6)
Mean Plasma Glucose: 146 mg/dL

## 2015-10-13 LAB — GLUCOSE, CAPILLARY
Glucose-Capillary: 183 mg/dL — ABNORMAL HIGH (ref 65–99)
Glucose-Capillary: 271 mg/dL — ABNORMAL HIGH (ref 65–99)
Glucose-Capillary: 241 mg/dL — ABNORMAL HIGH (ref 65–99)
Glucose-Capillary: 221 mg/dL — ABNORMAL HIGH (ref 65–99)

## 2015-10-13 LAB — TROPONIN I: Troponin I: 3.32 ng/mL (ref <0.03)

## 2015-10-13 MED ORDER — LOSARTAN POTASSIUM 50 MG PO TABS
50.0000 mg | ORAL_TABLET | Freq: Every day | ORAL | Status: DC
  Administered 2015-10-16: 50 mg via ORAL
  Filled 2015-10-13: qty 1

## 2015-10-13 MED ORDER — SODIUM CHLORIDE 0.9 % WEIGHT BASED INFUSION: 1.0000 mL/kg/h | INTRAVENOUS | Status: DC

## 2015-10-13 MED ORDER — SODIUM CHLORIDE 0.9% FLUSH: 3.0000 mL | INTRAVENOUS | Status: DC | PRN

## 2015-10-13 MED ORDER — ASPIRIN 325 MG PO TABS
325.0000 mg | ORAL_TABLET | Freq: Every day | ORAL | Status: DC
  Administered 2015-10-13 – 2015-10-14 (×2): 325 mg via ORAL
  Filled 2015-10-13 (×2): qty 1

## 2015-10-13 MED ORDER — TORSEMIDE 20 MG PO TABS
20.0000 mg | ORAL_TABLET | Freq: Every day | ORAL | Status: DC
  Administered 2015-10-16: 20 mg via ORAL
  Filled 2015-10-13: qty 1

## 2015-10-13 MED ORDER — SODIUM CHLORIDE 0.9% FLUSH
3.0000 mL | Freq: Two times a day (BID) | INTRAVENOUS | Status: DC
  Administered 2015-10-13: 3 mL via INTRAVENOUS

## 2015-10-13 MED ORDER — SODIUM CHLORIDE 0.9 % IV SOLN
INTRAVENOUS | Status: DC
  Administered 2015-10-13: 21:00:00 via INTRAVENOUS

## 2015-10-13 MED ORDER — SODIUM CHLORIDE 0.9 % IV SOLN: 250.0000 mL | INTRAVENOUS | Status: DC | PRN

## 2015-10-13 MED ORDER — SODIUM CHLORIDE 0.9 % WEIGHT BASED INFUSION: 3.0000 mL/kg/h | INTRAVENOUS | Status: DC

## 2015-10-13 NOTE — Evaluation (Signed)
Physical Therapy Evaluation Patient Details Name: [name] MRN: [id number] DOB: April 25, 1940 Today's Date: 10/13/2015   History of Present Illness  75 yo F admitted with L UE weakness.   Head CT revealed no acute findings with elevated troponin  at 9.95 on 8/8, and down to 7.78 on 8/9.  MRI: Scattered small areas of acute infarct with small amount of hemorrhage in the right parietal lobe. There is also a small area of chronic infarction in this area.  PMH: anemia, anxiety, asthma, chronic respiratory failure, COPD, coronary atherosclerosis of native coronary artery with MI and angioplasty with stent, obesity, on home O2, OSA, osteoporosis, Takotsubo cardiomyopathy DM2, venous insufficiency, alveoloplasty, cholecystectomy, R shoulder open rotator cuff repair, EF 40%.  Clinical Impression  Pt received in bed, and was agreeable to PT evaluation.  Dtr arrived during evaluation.  Pt expressed that she is normally independent with all functional mobility.  During today's PT evaluation she demonstrated modified independence with all functional mobility tasks, and ambulated 258ft on 3L of O2.  Vitals were all WNL.  At this point, pt is functioning at baseline, and does not demonstrate need for continued skilled PT, therefore, will sign off.    Follow Up Recommendations No PT follow up    Equipment Recommendations  None recommended by PT    Recommendations for Other Services       Precautions / Restrictions Precautions Precautions: None Restrictions Weight Bearing Restrictions: No      Mobility  Bed Mobility Overal bed mobility: Modified Independent                Transfers Overall transfer level: Modified independent Equipment used: None                Ambulation/Gait Ambulation/Gait assistance: Supervision Ambulation Distance (Feet): 200 Feet Assistive device: None Gait Pattern/deviations: WFL(Within Functional Limits)        Stairs            Wheelchair  Mobility    Modified Rankin (Stroke Patients Only)       Balance Overall balance assessment: No apparent balance deficits (not formally assessed)                                           Pertinent Vitals/Pain Pain Assessment: No/denies pain    Home Living   Living Arrangements: Alone Available Help at Discharge: Available PRN/intermittently (kids run in and out all the time, and come to stay with her if she is feeling sick. ) Type of Home: Apartment Home Access: Stairs to enter   Entrance Stairs-Number of Steps: 1 small one Home Layout: One level Home Equipment: Tub bench;Hand held shower head;Grab bars - tub/shower;Cane - single point;Walker - 4 wheels;Walker - 2 wheels;Bedside commode      Prior Function Level of Independence: Independent with assistive device(s)   Gait / Transfers Assistance Needed: Pt states she uses a cane occasionally - when she felt a little wobbly.   ADL's / Homemaking Assistance Needed: independent with dressing, bathing, kids drive her to the grocery store, and assist her with getting clothes washed at the laudry mat.         Hand Dominance   Dominant Hand: Right    Extremity/Trunk Assessment   Upper Extremity Assessment: Defer to OT evaluation           Lower Extremity Assessment: Overall WFL for  tasks assessed         Communication   Communication: No difficulties  Cognition Arousal/Alertness: Awake/alert Behavior During Therapy: WFL for tasks assessed/performed Overall Cognitive Status: Within Functional Limits for tasks assessed                      General Comments      Exercises        Assessment/Plan    PT Assessment Patent does not need any further PT services  PT Diagnosis Generalized weakness   PT Problem List    PT Treatment Interventions     PT Goals (Current goals can be found in the Care Plan section) Acute Rehab PT Goals PT Goal Formulation: All assessment and education  complete, DC therapy    Frequency     Barriers to discharge        Co-evaluation               End of Session Equipment Utilized During Treatment: Gait belt;Oxygen Activity Tolerance: Patient tolerated treatment well Patient left: in chair;with call bell/phone within reach;with family/visitor present           Time: [id number] PT Time Calculation (min) (ACUTE ONLY): 25 min   Charges:   PT Evaluation $PT Eval Low Complexity: 1 Procedure PT Treatments $Gait Training: 8-22 mins   PT G Codes:        Beth Yarimar Lavis, PT, DPT X: [phone]

## 2015-10-13 NOTE — Progress Notes (Signed)
[facility] PROGRESS NOTE  [name] ZOX:[id number] DOB: 23-Jun-1940 DOA: 10/11/2015 PCP: Toma Deiters, MD  Interim summary and HPI 75 year old female with history of 2 previous CVA's, DM, HTN, HL, CKD 3 and morbid obesity. Patient presents with left upper extremity weakness that started about 5 hours. The patient was deemed to be outside of the window for TPA at presentation. No headache, no SOB, no fever or chills, no chest pain and no GI or urinary symptoms. CT scan has not revealed any acute findings. Troponin is greater than 9 on admission.  Transfer to [facility] today for Left heart cath on 10/14/15. Work up for stroke essentially completed.   Assessment/Plan: 1-stroke like symptoms: with scattered ischemic areas affecting parietal lobe (small hemorrhage seen) -neg CT head -2-D echo w/o visible thrombus and carotid duplex demonstrating 50-69% stenosis bilaterally, vertebral flow is antegrade.  -patient w/o significant residual deficit  -was on baby aspirin prior to admission; neurology initially planning to do 3 months dual agent with subsequent transition to just plavix. But given episode of PAF on telemetry, plan is for eliquis after cath. -neurology consulted, appreciate assistance and rec's -continue risk factors modifications (BP control, statins and management of diabetes)  2-elevated troponin -w/o CP or SOB -will monitor on telemetry; with isolated transient PAF -troponin level trending down (currently 3.32) -Echo results w/o wall motion abnormalities and preserved EF -cardiology consulted, given high elevation of troponin and elevated heart score; planning to have left heart cath on 10/14/15 at [facility]  3-HTN: -will continue Imdur and bisoprolol  -cardiology planning to start losartan and torsemide on 10/16/15   4-type 2 diabetes with nephropathy -will continue SSI and modified carbohydrates  5-CKD stage 3 -Cr at baseline -will monitor trend, especially with  anticipated heart cath -planning to provide gentle hydration overnight   6-HLD -continue statins  7-depression and anxiety  -will continue citalopram and PRN ativan  8-GERD: -will continue PPI  9-PAF: -seen on telemetry this morning -self-terminated and back into sinus currently -CHADsVASC score is 5; patient will need anticoagulation -plan is to start eliquis after heart cath (planned for 10/14/15)  Code Status: Full Family Communication: daughter at bedside  Disposition Plan: no PT or OT needed; after discussing with cardiology and given high troponin and previous hx of CAD, patient will be transferred to [facility] for heart Cath. Found with transient episode of PAF (so will need anticoagulation)  Consultants:  Cardiology  Neurology   Procedures:  2-D echo pending - Left ventricle: The cavity size was normal. Wall thickness was   increased in a pattern of moderate LVH. Systolic function was   normal. The estimated ejection fraction was in the range of 60%   to 65%. Wall motion was normal; there were no regional wall   motion abnormalities. Doppler parameters are consistent with   abnormal left ventricular relaxation (grade 1 diastolic   dysfunction). - Aortic valve: Mildly calcified annulus. Trileaflet; mildly   thickened leaflets. Valve area (VTI): 1.91 cm^2. Valve area   (Vmax): 1.97 cm^2. Valve area (Vmean): 1.7 cm^2. - Mitral valve: Mildly calcified annulus. Mildly thickened leaflets   . There was mild regurgitation. - Left atrium: The atrium was mildly dilated. - Atrial septum: Poorly visualized. A patent foramen ovale cannot   be excluded. - Technically difficult study. Echocontrast was used to enhance   visualization.   MRI head Scattered small areas of acute infarct with small amount of hemorrhage in the right parietal lobe. There is  also a small area of chronic infarction in this area   MRA head and neck -Tortuosity of the proximal internal carotid artery  limits resolution -There appears to be mild stenosis at the origin of the right internal and external carotid arteries. -Left carotid widely patent.  Both vertebral arteries widely patent. -Proximal great vessels in the circle of Willis are widely patent without stenosis.   Carotid duplex: 1. Heavily calcified bilateral carotid bifurcation and proximal ICA plaque, resulting in 50-69% diameter stenosis bilaterally. The exam does not exclude plaque ulceration or embolization. Continued surveillance recommended.  Antibiotics:  None   HPI/Subjective: Afebrile, in no major acute distress. Denies any left side weakness, difficulty swallowing, CP or SOB.  Objective: Vitals:   10/13/15 0928 10/13/15 1005  BP: (!) 180/86 (!) 159/73  Pulse: 97 84  Resp:  20  Temp:  98.6 F (37 C)    Intake/Output Summary (Last 24 hours) at 10/13/15 1544 Last data filed at 10/12/15 2100  Gross per 24 hour  Intake              120 ml  Output              250 ml  Net             -130 ml   Filed Weights   10/11/15 2205  Weight: 97.4 kg (214 lb 12.8 oz)    Exam:   General:  Afebrile, no complaining of CP, no SOB. 2L Roberts in place (chronically and unchanged). In no acute distress.    Cardiovascular: S1 and S2, no rubs or gallops  Respiratory: good air movement, no crackles   Abdomen: Obese, soft, NT, ND, positive BS  Musculoskeletal: no edema or cyanosis   Data Reviewed: Basic Metabolic Panel:  Recent Labs Lab 10/11/15 1856 10/11/15 1914  NA 137 140  K 4.4 4.3  CL 97* 94*  CO2 32  --   GLUCOSE 270* 259*  BUN 22* 21*  CREATININE 1.52* 1.40*  CALCIUM 9.3  --    Liver Function Tests:  Recent Labs Lab 10/11/15 1856  AST 69*  ALT 35  ALKPHOS 63  BILITOT 0.4  PROT 6.8  ALBUMIN 3.8   CBC:  Recent Labs Lab 10/11/15 1856 10/11/15 1914  WBC 13.7*  --   NEUTROABS 11.6*  --   HGB 11.8* 11.9*  HCT 36.0 35.0*  MCV 94.0  --   PLT 208  --    Cardiac Enzymes:  Recent  Labs Lab 10/11/15 1856 10/12/15 0053 10/12/15 0608 10/13/15 0940  TROPONINI 9.95* CRITICAL VALUE NOTED.  VALUE IS CONSISTENT WITH PREVIOUSLY REPORTED AND CALLED VALUE. 7.78* 3.32*   ProBNP (last 3 results)  Recent Labs  07/12/15 1218  PROBNP 84.0    CBG:  Recent Labs Lab 10/12/15 1127 10/12/15 1611 10/12/15 2119 10/13/15 0807 10/13/15 1136  GLUCAP 268* 225* 139* 183* 271*   Studies: Ct Head Wo Contrast  Addendum Date: 10/11/2015   ADDENDUM REPORT: 10/11/2015 19:07 ADDENDUM: These results were called by telephone at the time of interpretation on 10/11/2015 at 7:04 pm to Dr. Jacalyn Lefevre , who verbally acknowledged these results. Electronically Signed   By: Gaylyn Rong M.D.   On: 10/11/2015 19:07   Result Date: 10/11/2015 CLINICAL DATA:  Left arm weakness starting at 4 p.m. today. Diabetes and hypertension. EXAM: CT HEAD WITHOUT CONTRAST TECHNIQUE: Contiguous axial images were obtained from the base of the skull through the vertex without intravenous contrast. COMPARISON:  10/31/2014 FINDINGS:  Minimal chronic encephalomalacia in the right parietal region, not appreciably changed compared to 10/03/14. Faint linear hypodensity in the right occipital lobe, images 14-15 series 2, also not appreciably changed from 2016 exams. Otherwise, the brainstem, cerebellum, cerebral peduncles, thalami, basal ganglia, basilar cisterns, and ventricular system appear within normal limits. No intracranial hemorrhage, mass lesion, or acute CVA. Mild chronic ethmoid sinusitis. IMPRESSION: 1. No acute intracranial findings. 2. Linear hypodensities in the right occipital and right parietal lobes possibly from remote small infarcts. Not appreciably changed from 10/03/2014. 3. Mild chronic ethmoid sinusitis. Electronically Signed: By: Gaylyn RongWalter  Liebkemann M.D. On: 10/11/2015 19:01   Mr Maxine GlennMra Head Wo Contrast  Result Date: 10/12/2015 CLINICAL DATA:  Right parietal stroke EXAM: MRA HEAD WITHOUT CONTRAST  TECHNIQUE: Angiographic images of the Circle of Willis were obtained using MRA technique without intravenous contrast. COMPARISON:  MRI head 10/12/2015 FINDINGS: Image quality degraded by intracranial vessel tortuosity. There is decreased signal in the posterior cerebral arteries due to tortuosity. There is also artifact over the MCA branch vessels. Both vertebral arteries patent to the basilar. Left PICA patent. Right PICA not visualized. Basilar patent. Superior cerebellar arteries are patent. Proximal posterior cerebral arteries are patent. Mid and distal posterior cerebral arteries not visualized due to tortuosity Cavernous carotid widely patent bilaterally. Anterior and middle cerebral arteries are patent without significant stenosis. Distal branches not well visualized due to artifact. IMPRESSION: Exam quality degraded due to artifact and tortuosity. Proximal great vessels in the circle of Willis are widely patent without stenosis. Electronically Signed   By: Marlan Palauharles  Clark M.D.   On: 10/12/2015 11:11   Mr Angiogram Neck W Contrast  Result Date: 10/12/2015 CLINICAL DATA:  Right parietal stroke. EXAM: MRI HEAD AND MRA NECK WITH CONTRAST TECHNIQUE: Multiplanar and multiecho pulse sequences of the neck were obtained with intravenous contrast. Angiographic images of the neck were obtained using MRA technique with intravenous contast. CONTRAST:  20mL MULTIHANCE GADOBENATE DIMEGLUMINE 529 MG/ML IV SOLN COMPARISON:  MRI head 10/12/2015 FINDINGS: Both vertebral arteries widely patent to the basilar without stenosis Tortuosity of the proximal internal carotid artery bilaterally causing signal loss. No evidence of dissection. Mild irregularity in the right carotid bifurcation with mild stenosis of the right internal and external carotid arteries. Left carotid bifurcation widely patent. IMPRESSION: Tortuosity of the proximal internal carotid artery limits resolution There appears to be mild stenosis at the origin of  the right internal and external carotid arteries. Left carotid widely patent.  Both vertebral arteries widely patent. Electronically Signed   By: Marlan Palauharles  Clark M.D.   On: 10/12/2015 11:14   Mr Brain Wo Contrast  Result Date: 10/12/2015 CLINICAL DATA:  CVA.  Left arm weakness EXAM: MRI HEAD WITHOUT CONTRAST TECHNIQUE: Multiplanar, multiecho pulse sequences of the brain and surrounding structures were obtained without intravenous contrast. COMPARISON:  CT head 10/11/2015 FINDINGS: Acute infarct in the right posterior parietal lobe extending near the occipital lobe. This is likely all in the posterior right MCA territory. Small amount of associated hemorrhage in the posterior portion of the infarct. Based on prior CT is also a small area of chronic infarct in the right parietal cortex in this area. No other areas of acute infarct Basal ganglia and brainstem intact. Mild chronic microvascular ischemic changes in the white matter elsewhere. Negative for mass or edema.  No shift of the midline structures Generalized atrophy. Circle of Willis appears patent. Mild mucosal edema in the paranasal sinuses. Bilateral lens replacement. Sella is enlarged and filled with CSF  with small pituitary compatible with empty sella. IMPRESSION: Scattered small areas of acute infarct with small amount of hemorrhage in the right parietal lobe. There is also a small area of chronic infarction in this area. Generalized atrophy. Electronically Signed   By: Marlan Palau M.D.   On: 10/12/2015 11:08   US Carotid Bilateral (at [facility] And [facility] Only)  Result Date: 10/12/2015 CLINICAL DATA:  Left upper extremity weakness.  Previous stroke. EXAM: BILATERAL CAROTID DUPLEX ULTRASOUND TECHNIQUE: Wallace Cullens scale imaging, color Doppler and duplex ultrasound was performed of bilateral carotid and vertebral arteries in the neck. COMPARISON:  MRA from earlier the same day TECHNIQUE: Quantification of carotid stenosis is based on velocity parameters that  correlate the residual internal carotid diameter with NASCET-based stenosis levels, using the diameter of the distal internal carotid lumen as the denominator for stenosis measurement. The following velocity measurements were obtained: PEAK SYSTOLIC/END DIASTOLIC RIGHT ICA:                     178/32cm/sec CCA:                     60/7cm/sec SYSTOLIC ICA/CCA RATIO:  3.0 DIASTOLIC ICA/CCA RATIO: 4.6 ECA:                     251cm/sec LEFT ICA:                     176/32cm/sec CCA:                     99/12cm/sec SYSTOLIC ICA/CCA RATIO:  1.8 DIASTOLIC ICA/CCA RATIO: 2.8 ECA:                     120cm/sec FINDINGS: RIGHT CAROTID ARTERY: Scattered calcified nonocclusive plaque through the common carotid artery. Heavy calcified plaque in the carotid bulb extending into proximal ICA and ECA resulting in at least mild stenosis. Normal waveforms. No focal color aliasing on color Doppler. RIGHT VERTEBRAL ARTERY:  Normal flow direction and waveform. LEFT CAROTID ARTERY: Calcified plaque in the distal common carotid artery, bulb, and proximal ICA resulting in at least mild stenosis. Normal waveforms and color Doppler signal. Distal ICA tortuous. LEFT VERTEBRAL ARTERY: Normal flow direction and waveform. IMPRESSION: 1. Heavily calcified bilateral carotid bifurcation and proximal ICA plaque, resulting in 50-69% diameter stenosis bilaterally. The exam does not exclude plaque ulceration or embolization. Continued surveillance recommended. Electronically Signed   By: Corlis Leak M.D.   On: 10/12/2015 14:31    Scheduled Meds: . acidophilus  1 capsule Oral Daily  . ALPRAZolam  0.5 mg Oral TID  . aspirin  325 mg Oral Daily  . atorvastatin  40 mg Oral q1800  . bisoprolol  2.5 mg Oral Daily  . budesonide  0.25 mg Nebulization BID  . calcium-vitamin D  1 tablet Oral BID  . citalopram  10 mg Oral Daily  . clopidogrel  75 mg Oral Daily  . enoxaparin (LOVENOX) injection  40 mg Subcutaneous Q24H  . ferrous sulfate  325 mg Oral Q  breakfast  . fluticasone  1 spray Each Nare BID  . HYDROcodone-acetaminophen  1 tablet Oral BID  . insulin aspart  0-5 Units Subcutaneous QHS  . insulin aspart  0-9 Units Subcutaneous TID WC  . insulin aspart protamine- aspart  30 Units Subcutaneous BID WC  . ipratropium-albuterol  3 mL Nebulization QID  . isosorbide mononitrate  30 mg Oral  Daily  . [START ON 10/16/2015] losartan  50 mg Oral Daily  . magic mouthwash  5 mL Oral QID  . multivitamin with minerals  1 tablet Oral Daily  . pantoprazole  40 mg Oral Daily  . [START ON 10/16/2015] torsemide  20 mg Oral Daily  . vitamin B-12  1,000 mcg Oral Daily   . sodium chloride     Time spent: 30 minutes    Vassie Loll  [facility] Pager [phone]. If 7PM-7AM, please contact night-coverage at [email or web address], password [facility] 10/13/2015, 3:44 PM  LOS: 2 days

## 2015-10-13 NOTE — Progress Notes (Addendum)
Subjective:   arm weakness almost completely resolved  Objective:  Vital Signs in the last 24 hours: Temp:  [98 F (36.7 C)-99.2 F (37.3 C)] 98.8 F (37.1 C) (08/10 0605) Pulse Rate:  [72-97] 97 (08/10 0928) Resp:  [20] 20 (08/10 0605) BP: (120-180)/(50-86) 180/86 (08/10 0928) SpO2:  [94 %-100 %] 98 % (08/10 0928)  Intake/Output from previous day:  Intake/Output Summary (Last 24 hours) at 10/13/15 1035 Last data filed at 10/12/15 2100  Gross per 24 hour  Intake              120 ml  Output              250 ml  Net             -130 ml    Physical Exam: General appearance: alert, cooperative, no distress and morbidly obese Lungs: clear to auscultation bilaterally Heart: regular rate and rhythm Neurologic: Grossly normal   Rate: 88  Rhythm: normal sinus rhythm and it appears she had PAF on telemetry  Lab Results:  Recent Labs  10/11/15 1856 10/11/15 1914  WBC 13.7*  --   HGB 11.8* 11.9*  PLT 208  --     Recent Labs  10/11/15 1856 10/11/15 1914  NA 137 140  K 4.4 4.3  CL 97* 94*  CO2 32  --   GLUCOSE 270* 259*  BUN 22* 21*  CREATININE 1.52* 1.40*    Recent Labs  10/12/15 0053 10/12/15 0608  TROPONINI CRITICAL VALUE NOTED.  VALUE IS CONSISTENT WITH PREVIOUSLY REPORTED AND CALLED VALUE. 7.78*    Recent Labs  10/11/15 1856  INR 0.99    Scheduled Meds: . acidophilus  1 capsule Oral Daily  . ALPRAZolam  0.5 mg Oral TID  . aspirin  325 mg Oral Daily  . atorvastatin  40 mg Oral q1800  . bisoprolol  2.5 mg Oral Daily  . budesonide  0.25 mg Nebulization BID  . calcium-vitamin D  1 tablet Oral BID  . citalopram  10 mg Oral Daily  . clopidogrel  75 mg Oral Daily  . enoxaparin (LOVENOX) injection  40 mg Subcutaneous Q24H  . ferrous sulfate  325 mg Oral Q breakfast  . fluticasone  1 spray Each Nare BID  . HYDROcodone-acetaminophen  1 tablet Oral BID  . insulin aspart  0-5 Units Subcutaneous QHS  . insulin aspart  0-9 Units Subcutaneous TID WC   . insulin aspart protamine- aspart  30 Units Subcutaneous BID WC  . ipratropium-albuterol  3 mL Nebulization QID  . isosorbide mononitrate  30 mg Oral Daily  . losartan  50 mg Oral Daily  . magic mouthwash  5 mL Oral QID  . multivitamin with minerals  1 tablet Oral Daily  . pantoprazole  40 mg Oral Daily  . torsemide  20 mg Oral Daily  . vitamin B-12  1,000 mcg Oral Daily   Continuous Infusions:  PRN Meds:.nitroGLYCERIN, senna-docusate, sodium chloride   Imaging: Imaging results have been reviewed  Cardiac Studies: echo 10/12/15 Study Conclusions  - Left ventricle: The cavity size was normal. Wall thickness was   increased in a pattern of moderate LVH. Systolic function was   normal. The estimated ejection fraction was in the range of 60%   to 65%. Wall motion was normal; there were no regional wall   motion abnormalities. Doppler parameters are consistent with   abnormal left ventricular relaxation (grade 1 diastolic   dysfunction). - Aortic valve: Mildly  calcified annulus. Trileaflet; mildly   thickened leaflets. Valve area (VTI): 1.91 cm^2. Valve area   (Vmax): 1.97 cm^2. Valve area (Vmean): 1.7 cm^2. - Mitral valve: Mildly calcified annulus. Mildly thickened leaflets   . There was mild regurgitation. - Left atrium: The atrium was mildly dilated. - Atrial septum: Poorly visualized. A patent foramen ovale cannot   be excluded. - Technically difficult study. Echocontrast was used to enhance   visualization.  Assessment/Plan:  75 y.o. female, lives alone in an apartment, with a hx of  with history of CAD status post MI with stenting of the LAD in 2000, repeat PTCA to this lesion in 02/1999, PTCA/brachytherapy to the mid LAD in 2001. Last cath in 2016 showed moderate LAD disease and a severely diseased small nondominant RCA treated medically. History of stress-induced cardiomyopathy with improvement in LVEF to 50-55% on echo 01/2015, hypertension, hyperlipidemia, 2 previous  CVAs, DM, CK D stage III, and morbid obesity with chronic hypoxic respiratory failure on home O2. She was admitted 10/11/15 with acute onset RUE weakness. MRI showed scattered small areas of acute infarct with small amount of hemorrhage in the right parietal lobe.    Principal Problem:   Acute CVA (cerebrovascular accident) (HCC) Active Problems:   PAF-new onset   Morbid obesity (HCC)   Essential hypertension, benign   Chronic renal disease, stage III   Chronic hypoxemic respiratory failure (HCC)   CAD S/P percutaneous coronary angioplasty   Takotsubo cardiomyopathy   PLAN:  It appears she had PAF documented on telemetry- will review with MD. She denies any history of PAF or palpitations. She is currently on full dose ASA and Plavix, Lipitor 40 mg, and bisoprolol 2.5 mg daily.   Corine Shelter PA-C 10/13/2015, 10:35 AM [phone]  Attending Note Patient seen and discussed with PA Diona Fanti, I agree with his documentation above. Presented with acute arm weakness, no cardiopulmonary symptms, significant elevation of troponin to 9 that trended down. EKG without acute ischemic changes. LVEF 60-65%, no WMAs. She has significant renal disease so fairly high threshold for cath, difficult to recommend given the risk without any other supportive clinical context other than lab abnormality. Troponin elevation can occur after CVA however to this magnitude is less common. I discussed findings with patient, she is hesistant for cath at this time. I will discuss with neuro to get there thoughts about risk of cath given recent CVA, and readdress with patient later today as she has asked for time to think about it. Episode of afib noted on telemetry, possible etiology of her CVA. Will need to check with neuro regarding if ok to start anticoag. Perhaps she embolized a clot to bother her coronaries and cerebral vessels.   Dominga Ferry MD   Addendum 10/13/15 300pm Discussed patient with neurology Dr Janann Colonel, reports  area of stroke is small and risk of bleeding with anticoagulation is low. Ok to proceed with cath, ok to start eliquis tomorrow after cath. Discussed with Dr Gwenlyn Perking to arrange transfer. Patient agrees to have cath done tomorrow. We will hydrate overnight.   Dominga Ferry MD

## 2015-10-13 NOTE — Progress Notes (Signed)
Received [facility] to [facility]. Pt in stable condition and in no acute distress. Pt and her family made aware [facility]. Pt has a bed 3West 16C. Report was called and given to Tommy MedalJosh Townes [facility]. Pt's vital signs are as follows   10/13/15 1635  Vitals  Temp 98.3 F (36.8 C)  Temp Source Oral  BP (!) 154/58  BP Location Right Arm  BP Method Automatic  Patient Position (if appropriate) Lying  Pulse Rate 75  Pulse Rate Source Dinamap  Resp 18  Oxygen Therapy  SpO2 99 %  O2 Device Nasal Cannula  O2 Flow Rate (L/min) 3 L/min   Pt is escorted by [facility]. Report given to [facility]. All questions were answered and no further questions at this time.

## 2015-10-13 NOTE — Care Management Note (Signed)
Case Management Note  Patient Details  Name: [name] MRN: [id number] Date of Birth: 03/02/1941  Subjective/Objective:                  Pt is from home, lives alone and has family that comes in and out throughout the day. Pt's daughter at the bedside and verifies pt is rarely alone. Pt is ind with ADL's. Pt uses no DME for mobility. Pt has home oxygen via [employer] and neb machine. Pt is on 3 lpm chronically. PT/OT require no f/u. SLP has recommended HH SLP. Pt refuses HH services. Instructed pt to contact her PCP if she changes her mind after DC.   Action/Plan: Anticipate DC home with self care. No CM needs.   Expected Discharge Date:  10/13/15               Expected Discharge Plan:  Home/Self Care  In-House Referral:  NA  Discharge planning Services  CM Consult  Post Acute Care Choice:  NA Choice offered to:  NA  DME Arranged:    DME Agency:     HH Arranged:    HH Agency:     Status of Service:  Completed, signed off  If discussed at [employer] Length of Stay Meetings, dates discussed:    Additional Comments:  Malcolm MetroChildress, Jermani Pund Demske, RN 10/13/2015, 11:58 AM

## 2015-10-13 NOTE — Evaluation (Signed)
Occupational Therapy Evaluation Patient Details Name: [name] F Schlereth MRN: [id number] DOB: 10/25/1940 Today's Date: 10/13/2015    History of Present Illness 75 yo F admitted with L UE weakness.   Head CT revealed no acute findings with elevated troponin  at 9.95 on 8/8, and down to 7.78 on 8/9.  MRI: Scattered small areas of acute infarct with small amount of hemorrhage in the right parietal lobe. There is also a small area of chronic infarction in this area.  PMH: anemia, anxiety, asthma, chronic respiratory failure, COPD, coronary atherosclerosis of native coronary artery with MI and angioplasty with stent, obesity, on home O2, OSA, osteoporosis, Takotsubo cardiomyopathy DM2, venous insufficiency, alveoloplasty, cholecystectomy, R shoulder open rotator cuff repair, EF 40%.   Clinical Impression   Pt awake, alert, oriented x4 this am, daughter present for evaluation. Pt demonstrates mod independence during ADL tasks and transfer tasks. Pt BUE strength WFL, coordination and sensation intact. Pt is at baseline with ADL and functional mobility tasks, no further OT services required.     Follow Up Recommendations  No OT follow up    Equipment Recommendations  None recommended by OT       Precautions / Restrictions Precautions Precautions: None Restrictions Weight Bearing Restrictions: No      Mobility Bed Mobility Overal bed mobility: Modified Independent             General bed mobility comments: Pt sitting at EOB when OT entered room  Transfers Overall transfer level: Modified independent Equipment used: None                  Balance Overall balance assessment: No apparent balance deficits (not formally assessed)                                          ADL Overall ADL's : Modified independent;At baseline                                             Vision Vision Assessment?: No apparent visual deficits          Pertinent  Vitals/Pain Pain Assessment: No/denies pain     Hand Dominance Right   Extremity/Trunk Assessment Upper Extremity Assessment Upper Extremity Assessment: Overall WFL for tasks assessed   Lower Extremity Assessment Lower Extremity Assessment: Defer to PT evaluation   Cervical / Trunk Assessment Cervical / Trunk Assessment: Normal   Communication Communication Communication: No difficulties   Cognition Arousal/Alertness: Awake/alert Behavior During Therapy: WFL for tasks assessed/performed Overall Cognitive Status: Within Functional Limits for tasks assessed                                Home Living Family/patient expects to be discharged to:: Private residence Living Arrangements: Alone Available Help at Discharge: Available PRN/intermittently (children) Type of Home: Apartment Home Access: Stairs to enter Entrance Stairs-Number of Steps: 1 small one   Home Layout: One level     Bathroom Shower/Tub: Chief Strategy OfficerTub/shower unit   Bathroom Toilet: Handicapped height     Home Equipment: Tub bench;Hand held shower head;Grab bars - tub/shower;Cane - single point;Walker - 4 wheels;Walker - 2 wheels;Bedside commode      Lives With: Alone    Prior  Functioning/Environment Level of Independence: Independent with assistive device(s)  Gait / Transfers Assistance Needed: Pt states she uses a cane occasionally - when she felt a little wobbly.  ADL's / Homemaking Assistance Needed: independent with dressing, bathing, kids drive her to the grocery store, and assist her with getting clothes washed at the laudry mat. Occasional assistance with bathing when sick          End of Session    Activity Tolerance: Patient tolerated treatment well Patient left: in chair;with family/visitor present;with call bell/phone within reach   Time: 1115-1131 OT Time Calculation (min): 16 min Charges:  OT General Charges $OT Visit: 1 Procedure OT Evaluation $OT Eval Low Complexity: 1  Procedure   Ezra Sites, OTR/L  [phone] 10/13/2015, 11:35 AM

## 2015-10-14 ENCOUNTER — Encounter (HOSPITAL_COMMUNITY)
Admission: EM | Disposition: A | Discharge status: Home / Self Care | Payer: Self-pay | Source: Home / Self Care | Attending: Internal Medicine

## 2015-10-14 ENCOUNTER — Encounter (HOSPITAL_COMMUNITY): Payer: Self-pay | Admitting: Physician Assistant

## 2015-10-14 ENCOUNTER — Ambulatory Visit (HOSPITAL_COMMUNITY): Admission: RE | Admit: 2015-10-14 | Payer: Medicare HMO | Source: Ambulatory Visit | Admitting: Cardiology

## 2015-10-14 DIAGNOSIS — I639 Cerebral infarction, unspecified: Secondary | ICD-10-CM

## 2015-10-14 DIAGNOSIS — I2510 Atherosclerotic heart disease of native coronary artery without angina pectoris: Secondary | ICD-10-CM

## 2015-10-14 DIAGNOSIS — I739 Peripheral vascular disease, unspecified: Secondary | ICD-10-CM

## 2015-10-14 DIAGNOSIS — I779 Disorder of arteries and arterioles, unspecified: Secondary | ICD-10-CM

## 2015-10-14 DIAGNOSIS — D649 Anemia, unspecified: Secondary | ICD-10-CM | POA: Diagnosis present

## 2015-10-14 HISTORY — DX: Disorder of arteries and arterioles, unspecified: I77.9

## 2015-10-14 HISTORY — PX: CARDIAC CATHETERIZATION: SHX172

## 2015-10-14 LAB — CBC
HEMATOCRIT: 32.3 % — AB (ref 36.0–46.0)
Hemoglobin: 10.1 g/dL — ABNORMAL LOW (ref 12.0–15.0)
MCH: 29 pg (ref 26.0–34.0)
MCHC: 31.3 g/dL (ref 30.0–36.0)
MCV: 92.8 fL (ref 78.0–100.0)
Platelets: 181 10*3/uL (ref 150–400)
RBC: 3.48 MIL/uL — ABNORMAL LOW (ref 3.87–5.11)
RDW: 12 % (ref 11.5–15.5)
WBC: 7.2 10*3/uL (ref 4.0–10.5)

## 2015-10-14 LAB — BASIC METABOLIC PANEL
Anion gap: 8 (ref 5–15)
BUN: 16 mg/dL (ref 6–20)
CO2: 33 mmol/L — AB (ref 22–32)
Calcium: 9.1 mg/dL (ref 8.9–10.3)
Chloride: 99 mmol/L — ABNORMAL LOW (ref 101–111)
Creatinine, Ser: 1.1 mg/dL — ABNORMAL HIGH (ref 0.44–1.00)
GFR calc Af Amer: 55 mL/min — ABNORMAL LOW (ref >60)
GFR, EST NON AFRICAN AMERICAN: 48 mL/min — AB (ref >60)
GLUCOSE: 145 mg/dL — AB (ref 65–99)
POTASSIUM: 4.1 mmol/L (ref 3.5–5.1)
Sodium: 140 mmol/L (ref 135–145)

## 2015-10-14 LAB — GLUCOSE, CAPILLARY
Glucose-Capillary: 121 mg/dL — ABNORMAL HIGH (ref 65–99)
Glucose-Capillary: 202 mg/dL — ABNORMAL HIGH (ref 65–99)
Glucose-Capillary: 336 mg/dL — ABNORMAL HIGH (ref 65–99)
Glucose-Capillary: 279 mg/dL — ABNORMAL HIGH (ref 65–99)
Glucose-Capillary: 44 mg/dL — CL (ref 65–99)

## 2015-10-14 LAB — PROTIME-INR
INR: 0.97
Prothrombin Time: 12.9 seconds (ref 11.4–15.2)

## 2015-10-14 SURGERY — LEFT HEART CATH
Anesthesia: Choice

## 2015-10-14 SURGERY — LEFT HEART CATH AND CORONARY ANGIOGRAPHY

## 2015-10-14 MED ORDER — SODIUM CHLORIDE 0.9% FLUSH
3.0000 mL | Freq: Two times a day (BID) | INTRAVENOUS | Status: DC
  Administered 2015-10-15 (×2): 3 mL via INTRAVENOUS

## 2015-10-14 MED ORDER — LIDOCAINE HCL (PF) 1 % IJ SOLN
INTRAMUSCULAR | Status: DC | PRN
  Administered 2015-10-14: 2 mL

## 2015-10-14 MED ORDER — VERAPAMIL HCL 2.5 MG/ML IV SOLN
INTRAVENOUS | Status: DC | PRN
  Administered 2015-10-14: 8 mL via INTRA_ARTERIAL

## 2015-10-14 MED ORDER — HEPARIN (PORCINE) IN NACL 2-0.9 UNIT/ML-% IJ SOLN
INTRAMUSCULAR | Status: DC | PRN
  Administered 2015-10-14: 1000 mL

## 2015-10-14 MED ORDER — SODIUM CHLORIDE 0.9% FLUSH: 3.0000 mL | INTRAVENOUS | Status: DC | PRN

## 2015-10-14 MED ORDER — ACETAMINOPHEN 325 MG PO TABS: 650.0000 mg | ORAL_TABLET | ORAL | Status: DC | PRN

## 2015-10-14 MED ORDER — IOPAMIDOL (ISOVUE-370) INJECTION 76%
INTRAVENOUS | Status: DC | PRN
  Administered 2015-10-14: 50 mL via INTRA_ARTERIAL

## 2015-10-14 MED ORDER — ONDANSETRON HCL 4 MG/2ML IJ SOLN: 4.0000 mg | Freq: Four times a day (QID) | INTRAMUSCULAR | Status: DC | PRN

## 2015-10-14 MED ORDER — MIDAZOLAM HCL 2 MG/2ML IJ SOLN
INTRAMUSCULAR | Status: DC | PRN
  Administered 2015-10-14: 2 mg via INTRAVENOUS

## 2015-10-14 MED ORDER — SODIUM CHLORIDE 0.9 % WEIGHT BASED INFUSION: 1.0000 mL/kg/h | INTRAVENOUS | Status: AC

## 2015-10-14 MED ORDER — ENOXAPARIN SODIUM 40 MG/0.4ML ~~LOC~~ SOLN: 40.0000 mg | SUBCUTANEOUS | Status: DC

## 2015-10-14 MED ORDER — HEPARIN SODIUM (PORCINE) 1000 UNIT/ML IJ SOLN
INTRAMUSCULAR | Status: DC | PRN
  Administered 2015-10-14: 5000 [IU] via INTRAVENOUS

## 2015-10-14 MED ORDER — FENTANYL CITRATE (PF) 100 MCG/2ML IJ SOLN
INTRAMUSCULAR | Status: DC | PRN
  Administered 2015-10-14: 50 ug via INTRAVENOUS

## 2015-10-14 MED ORDER — SODIUM CHLORIDE 0.9 % IV SOLN: 250.0000 mL | INTRAVENOUS | Status: DC | PRN

## 2015-10-14 SURGICAL SUPPLY — 9 items
CATH INFINITI 5 FR JL3.5 (CATHETERS) ×2 IMPLANT
CATH INFINITI JR4 5F (CATHETERS) ×2 IMPLANT
DEVICE RAD COMP TR BAND LRG (VASCULAR PRODUCTS) ×2 IMPLANT
GLIDESHEATH SLEND SS 6F .021 (SHEATH) ×2 IMPLANT
KIT HEART LEFT (KITS) ×3 IMPLANT
PACK CARDIAC CATHETERIZATION (CUSTOM PROCEDURE TRAY) ×3 IMPLANT
SYR MEDRAD MARK V 150ML (SYRINGE) IMPLANT
TRANSDUCER W/STOPCOCK (MISCELLANEOUS) ×3 IMPLANT
TUBING CIL FLEX 10 FLL-RA (TUBING) ×3 IMPLANT

## 2015-10-14 NOTE — Progress Notes (Addendum)
[facility] PROGRESS NOTE  [name]:[id number] DOB: 08/16/1940 DOA: 10/11/2015 PCP: [name] A HASANAJ, MD  Interim summary and HPI 75 year old female with history of 2 previous CVA's, DM, HTN, HL, CKD 3 and morbid obesity. Patient presents with left upper extremity weakness that started about 5 hours. The patient was deemed to be outside of the window for TPA at presentation. No headache, no SOB, no fever or chills, no chest pain and no GI or urinary symptoms. CT scan has not revealed any acute findings. Troponin is greater than 9 on admission.  [facility] on 10/14/15. Work up for stroke essentially completed.   Assessment/Plan: 1-Acute CVA: with scattered ischemic areas affecting parietal lobe (small hemorrhage seen) -neg CT head -2-D echo w/o visible thrombus and carotid duplex demonstrating 50-69% stenosis bilaterally, vertebral flow is antegrade.  -no significant residual deficit at this time -was on baby aspirin prior to admission; neurology was initially planning to do 3 months dual agent with subsequent transition to just plavix. But given episode of PAF on telemetry, plan is for eliquis after cath. After discussion between Dr.Branch and [place] -LDL is 61 and Hba1c is 6.7  2-Elevated troponin -no CP or SOB -telemetry; with isolated transient PAF -troponin level trending down nadir 3.32 yesterday -Echo results w/o wall motion abnormalities and preserved EF -cardiology following and now plan for LHC today, on IVF due to mild renal insuficciency  3-HTN: -will continue Imdur and bisoprolol  -losartan and torsemide ordered to start on 10/16/15 per Cards  4-type 2 diabetes with nephropathy -will continue SSI and modified carbohydrates  5-CKD stage 3 -Cr at baseline -IVF before cath  6-COPD -chronic resp failure on 3L O2 -stable  7-depression and anxiety  -will continue citalopram and PRN ativan  8-GERD: -will continue  PPI  9-PAF: -seen on telemetry this morning -self-terminated and back into sinus currently -CHADsVASC score is 5; patient will need anticoagulation -plan is to start eliquis after heart cath today  Code Status: Full Family Communication: daughter at bedside  Disposition Plan: home in 1-2days  Consultants:  Cardiology  Neurology   Procedures:  2-D echo pending - Left ventricle: The cavity size was normal. Wall thickness was   increased in a pattern of moderate LVH. Systolic function was   normal. The estimated ejection fraction was in the range of 60%   to 65%. Wall motion was normal; there were no regional wall   motion abnormalities. Doppler parameters are consistent with   abnormal left ventricular relaxation (grade 1 diastolic   dysfunction). - Aortic valve: Mildly calcified annulus. Trileaflet; mildly   thickened leaflets. Valve area (VTI): 1.91 cm^2. Valve area   (Vmax): 1.97 cm^2. Valve area (Vmean): 1.7 cm^2. - Mitral valve: Mildly calcified annulus. Mildly thickened leaflets   . There was mild regurgitation. - Left atrium: The atrium was mildly dilated. - Atrial septum: Poorly visualized. A patent foramen ovale cannot   be excluded. - Technically difficult study. Echocontrast was used to enhance   visualization.   MRI head Scattered small areas of acute infarct with small amount of hemorrhage in the right parietal lobe. There is also a small area of chronic infarction in this area   MRA head and neck -Tortuosity of the proximal internal carotid artery limits resolution -There appears to be mild stenosis at the origin of the right internal and external carotid arteries. -Left carotid widely patent.  Both vertebral arteries widely patent. -Proximal great  vessels in the circle of Willis are widely patent without stenosis.   Carotid duplex: 1. Heavily calcified bilateral carotid bifurcation and proximal ICA plaque, resulting in 50-69% diameter stenosis  bilaterally. The exam does not exclude plaque ulceration or embolization. Continued surveillance recommended.  Antibiotics:  None   HPI/Subjective: Feels that left side is back to baseline, no other symptoms.  Objective: Vitals:   10/14/15 0841 10/14/15 0844  BP:  (!) 162/61  Pulse: 72 80  Resp: (!) 23   Temp:  98 F (36.7 C)    Intake/Output Summary (Last 24 hours) at 10/14/15 1033 Last data filed at 10/13/15 2129  Gross per 24 hour  Intake              240 ml  Output              300 ml  Net              -60 ml   Filed Weights   10/11/15 2205 10/14/15 0334  Weight: 97.4 kg (214 lb 12.8 oz) 96.4 kg (212 lb 8 oz)    Exam:   General:  Afebrile, no complaining of CP, no SOB. 2L [place] in place..In no acute distress.    Cardiovascular: S1 and S2, no rubs or gallops  Respiratory: poor air movement  Abdomen: Obese, soft, NT, ND, positive BS  Musculoskeletal: no edema or cyanosis   Data Reviewed: Basic Metabolic Panel:  Recent Labs Lab 10/11/15 1856 10/11/15 1914 10/14/15 0854  NA 137 140 140  K 4.4 4.3 4.1  CL 97* 94* 99*  CO2 32  --  33*  GLUCOSE 270* 259* 145*  BUN 22* 21* 16  CREATININE 1.52* 1.40* 1.10*  CALCIUM 9.3  --  9.1   Liver Function Tests:  Recent Labs Lab 10/11/15 1856  AST 69*  ALT 35  ALKPHOS 63  BILITOT 0.4  PROT 6.8  ALBUMIN 3.8   CBC:  Recent Labs Lab 10/11/15 1856 10/11/15 1914 10/14/15 0854  WBC 13.7*  --  7.2  NEUTROABS 11.6*  --   --   HGB 11.8* 11.9* 10.1*  HCT 36.0 35.0* 32.3*  MCV 94.0  --  92.8  PLT 208  --  181   Cardiac Enzymes:  Recent Labs Lab 10/11/15 1856 10/12/15 0053 10/12/15 0608 10/13/15 0940  TROPONINI 9.95* CRITICAL VALUE NOTED.  VALUE IS CONSISTENT WITH PREVIOUSLY REPORTED AND CALLED VALUE. 7.78* 3.32*   ProBNP (last 3 results)  Recent Labs  07/12/15 1218  PROBNP 84.0    CBG:  Recent Labs Lab 10/13/15 0807 10/13/15 1136 10/13/15 1651 10/13/15 2025 10/14/15 0729  GLUCAP  183* 271* 241* 221* 121*   Studies: Mr [name] Head Wo Contrast  Result Date: 10/12/2015 CLINICAL DATA:  Right parietal stroke EXAM: MRA HEAD WITHOUT CONTRAST TECHNIQUE: Angiographic images of the Circle of Willis were obtained using MRA technique without intravenous contrast. COMPARISON:  MRI head 10/12/2015 FINDINGS: Image quality degraded by intracranial vessel tortuosity. There is decreased signal in the posterior cerebral arteries due to tortuosity. There is also artifact over the MCA branch vessels. Both vertebral arteries patent to the basilar. Left PICA patent. Right PICA not visualized. Basilar patent. Superior cerebellar arteries are patent. Proximal posterior cerebral arteries are patent. Mid and distal posterior cerebral arteries not visualized due to tortuosity Cavernous carotid widely patent bilaterally. Anterior and middle cerebral arteries are patent without significant stenosis. Distal branches not well visualized due to artifact. IMPRESSION: Exam quality degraded due to artifact  and tortuosity. Proximal great vessels in the circle of Willis are widely patent without stenosis. Electronically Signed   By: Marlan Palau M.D.   On: 10/12/2015 11:11   Mr Angiogram Neck W Contrast  Result Date: 10/12/2015 CLINICAL DATA:  Right parietal stroke. EXAM: MRI HEAD AND MRA NECK WITH CONTRAST TECHNIQUE: Multiplanar and multiecho pulse sequences of the neck were obtained with intravenous contrast. Angiographic images of the neck were obtained using MRA technique with intravenous contast. CONTRAST:  20mL MULTIHANCE GADOBENATE DIMEGLUMINE 529 MG/ML IV SOLN COMPARISON:  MRI head 10/12/2015 FINDINGS: Both vertebral arteries widely patent to the basilar without stenosis Tortuosity of the proximal internal carotid artery bilaterally causing signal loss. No evidence of dissection. Mild irregularity in the right carotid bifurcation with mild stenosis of the right internal and external carotid arteries. Left carotid  bifurcation widely patent. IMPRESSION: Tortuosity of the proximal internal carotid artery limits resolution There appears to be mild stenosis at the origin of the right internal and external carotid arteries. Left carotid widely patent.  Both vertebral arteries widely patent. Electronically Signed   By: Marlan Palau M.D.   On: 10/12/2015 11:14   Mr Brain Wo Contrast  Result Date: 10/12/2015 CLINICAL DATA:  CVA.  Left arm weakness EXAM: MRI HEAD WITHOUT CONTRAST TECHNIQUE: Multiplanar, multiecho pulse sequences of the brain and surrounding structures were obtained without intravenous contrast. COMPARISON:  CT head 10/11/2015 FINDINGS: Acute infarct in the right posterior parietal lobe extending near the occipital lobe. This is likely all in the posterior right MCA territory. Small amount of associated hemorrhage in the posterior portion of the infarct. Based on prior CT is also a small area of chronic infarct in the right parietal cortex in this area. No other areas of acute infarct Basal ganglia and brainstem intact. Mild chronic microvascular ischemic changes in the white matter elsewhere. Negative for mass or edema.  No shift of the midline structures Generalized atrophy. Circle of Willis appears patent. Mild mucosal edema in the paranasal sinuses. Bilateral lens replacement. 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 IMPRESSION: Scattered small areas of acute infarct with small amount of hemorrhage in the right parietal lobe. There is also a small area of chronic infarction in this area. Generalized atrophy. Electronically Signed   By: Marlan Palau M.D.   On: 10/12/2015 11:08   US Carotid Bilateral (at [facility] And [facility] Only)  Result Date: 10/12/2015 CLINICAL DATA:  Left upper extremity weakness.  Previous stroke. EXAM: BILATERAL CAROTID DUPLEX ULTRASOUND TECHNIQUE: Wallace Cullens scale imaging, color Doppler and duplex ultrasound was performed of bilateral carotid and  vertebral arteries in the neck. COMPARISON:  MRA from earlier the same day TECHNIQUE: Quantification of carotid stenosis is based on velocity parameters that correlate the residual internal carotid diameter with NASCET-based stenosis levels, using the diameter of the distal internal carotid lumen as the denominator for stenosis measurement. The following velocity measurements were obtained: PEAK SYSTOLIC/END DIASTOLIC RIGHT ICA:                     178/32cm/sec CCA:                     60/7cm/sec SYSTOLIC ICA/CCA RATIO:  3.0 DIASTOLIC ICA/CCA RATIO: 4.6 ECA:                     251cm/sec LEFT ICA:  176/32cm/sec CCA:                     99/12cm/sec SYSTOLIC ICA/CCA RATIO:  1.8 DIASTOLIC ICA/CCA RATIO: 2.8 ECA:                     120cm/sec FINDINGS: RIGHT CAROTID ARTERY: Scattered calcified nonocclusive plaque through the common carotid artery. Heavy calcified plaque in the carotid bulb extending into proximal ICA and ECA resulting in at least mild stenosis. Normal waveforms. No focal color aliasing on color Doppler. RIGHT VERTEBRAL ARTERY:  Normal flow direction and waveform. LEFT CAROTID ARTERY: Calcified plaque in the distal common carotid artery, bulb, and proximal ICA resulting in at least mild stenosis. Normal waveforms and color Doppler signal. Distal ICA tortuous. LEFT VERTEBRAL ARTERY: Normal flow direction and waveform. IMPRESSION: 1. Heavily calcified bilateral carotid bifurcation and proximal ICA plaque, resulting in 50-69% diameter stenosis bilaterally. The exam does not exclude plaque ulceration or embolization. Continued surveillance recommended. Electronically Signed   By: Corlis Leak M.D.   On: 10/12/2015 14:31    Scheduled Meds: . acidophilus  1 capsule Oral Daily  . ALPRAZolam  0.5 mg Oral TID  . aspirin  325 mg Oral Daily  . atorvastatin  40 mg Oral q1800  . bisoprolol  2.5 mg Oral Daily  . budesonide  0.25 mg Nebulization BID  . calcium-vitamin D  1 tablet Oral BID  .  citalopram  10 mg Oral Daily  . clopidogrel  75 mg Oral Daily  . enoxaparin (LOVENOX) injection  40 mg Subcutaneous Q24H  . ferrous sulfate  325 mg Oral Q breakfast  . fluticasone  1 spray Each Nare BID  . HYDROcodone-acetaminophen  1 tablet Oral BID  . insulin aspart  0-5 Units Subcutaneous QHS  . insulin aspart  0-9 Units Subcutaneous TID WC  . insulin aspart protamine- aspart  30 Units Subcutaneous BID WC  . ipratropium-albuterol  3 mL Nebulization QID  . isosorbide mononitrate  30 mg Oral Daily  . [START ON 10/16/2015] losartan  50 mg Oral Daily  . magic mouthwash  5 mL Oral QID  . multivitamin with minerals  1 tablet Oral Daily  . pantoprazole  40 mg Oral Daily  . sodium chloride flush  3 mL Intravenous Q12H  . [START ON 10/16/2015] torsemide  20 mg Oral Daily  . vitamin B-12  1,000 mcg Oral Daily   . sodium chloride 1 mL/kg/hr (10/14/15 0700)   Time spent: 30 minutes    [facility]  [facility] Pager [phone]. If 7PM-7AM, please contact night-coverage at [email or web address], password [facility] 10/14/2015, 10:33 AM  LOS: 3 days

## 2015-10-14 NOTE — Progress Notes (Signed)
Inpatient Diabetes Program Recommendations  AACE/ADA: New Consensus Statement on Inpatient Glycemic Control (2015)  Target Ranges:  Prepandial:   less than 140 mg/dL      Peak postprandial:   less than 180 mg/dL (1-2 hours)      Critically ill patients:  140 - 180 mg/dL   Results for [name], [name] (MRN [id number]) as of 10/14/2015 09:08  Ref. Range 10/13/2015 08:07 10/13/2015 11:36 10/13/2015 16:51 10/13/2015 20:25 10/14/2015 07:29  Glucose-Capillary Latest Ref Range: 65 - 99 mg/dL 409183 (H) 811271 (H) 914241 (H) 221 (H) 121 (H)   Review of Glycemic Control  Diabetes history: DM2 Outpatient Diabetes medications: 70/30 30 units BID, Novolog 15 units TID with meals, Metformin 1000 mg BID Current orders for Inpatient glycemic control: 70/30 30 units BID, Novolog 0-9 units TID with meals, Novolog 0-5 units QHS  Inpatient Diabetes Program Recommendations: Insulin - Basal: When diet resumed today, please consider increasing 70/30 to 33 units BID.  Note: Noted patient is NPO for cath today and fasting glucose 121 mg/dl this morning.   Thanks, Orlando PennerMarie Emari Demmer, RN, MSN, CDE Diabetes Coordinator Inpatient Diabetes Program [phone] (Team Pager from 8am to 5pm) [phone] ([facility] office) [phone] [facility] office) [phone] [facility] office)

## 2015-10-14 NOTE — H&P (View-Only) (Signed)
Patient: [name] / Admit Date: 10/11/2015 / Date of Encounter: 10/14/2015, 8:44 AM   Subjective: No CP or SOB. Feels well. Weakness resolved.   Objective: Telemetry: NSR occ PVCs Physical Exam: Blood pressure (!) 159/65, pulse 72, temperature 98.2 F (36.8 C), temperature source Oral, resp. rate (!) 23, height 5\' 3"  (1.6 m), weight 212 lb 8 oz (96.4 kg), SpO2 98 %. General: Well developed, well nourished obese WF in no acute distress. Lying flat in bed without symptoms. Head: Normocephalic, atraumatic, sclera non-icteric, no xanthomas, nares are without discharge. Neck: Negative for carotid bruits. JVP not elevated. Lungs: Clear bilaterally to auscultation without wheezes, rales, or rhonchi. Breathing is unlabored. Heart: RRR S1 S2 without murmurs, rubs, or gallops.  Abdomen: Soft, non-tender, non-distended with normoactive bowel sounds. No rebound/guarding. Extremities: No clubbing or cyanosis. No edema. Distal pedal pulses are 2+ and equal bilaterally. Neuro: Alert and oriented X 3. Moves all extremities spontaneously. Psych:  Responds to questions appropriately with a normal affect.   Intake/Output Summary (Last 24 hours) at 10/14/15 0844 Last data filed at 10/13/15 2129  Gross per 24 hour  Intake              240 ml  Output              300 ml  Net              -60 ml    Inpatient Medications:  . acidophilus  1 capsule Oral Daily  . ALPRAZolam  0.5 mg Oral TID  . aspirin  325 mg Oral Daily  . atorvastatin  40 mg Oral q1800  . bisoprolol  2.5 mg Oral Daily  . budesonide  0.25 mg Nebulization BID  . calcium-vitamin D  1 tablet Oral BID  . citalopram  10 mg Oral Daily  . clopidogrel  75 mg Oral Daily  . enoxaparin (LOVENOX) injection  40 mg Subcutaneous Q24H  . ferrous sulfate  325 mg Oral Q breakfast  . fluticasone  1 spray Each Nare BID  . HYDROcodone-acetaminophen  1 tablet Oral BID  . insulin aspart  0-5 Units Subcutaneous QHS  . insulin aspart  0-9 Units  Subcutaneous TID WC  . insulin aspart protamine- aspart  30 Units Subcutaneous BID WC  . ipratropium-albuterol  3 mL Nebulization QID  . isosorbide mononitrate  30 mg Oral Daily  . [START ON 10/16/2015] losartan  50 mg Oral Daily  . magic mouthwash  5 mL Oral QID  . multivitamin with minerals  1 tablet Oral Daily  . pantoprazole  40 mg Oral Daily  . sodium chloride flush  3 mL Intravenous Q12H  . [START ON 10/16/2015] torsemide  20 mg Oral Daily  . vitamin B-12  1,000 mcg Oral Daily   Infusions:  . sodium chloride 75 mL/hr at 10/13/15 2129  . sodium chloride 1 mL/kg/hr (10/14/15 0700)    Labs:  Recent Labs  10/11/15 1856 10/11/15 1914  NA 137 140  K 4.4 4.3  CL 97* 94*  CO2 32  --   GLUCOSE 270* 259*  BUN 22* 21*  CREATININE 1.52* 1.40*  CALCIUM 9.3  --     Recent Labs  10/11/15 1856  AST 69*  ALT 35  ALKPHOS 63  BILITOT 0.4  PROT 6.8  ALBUMIN 3.8    Recent Labs  10/11/15 1856 10/11/15 1914  WBC 13.7*  --   NEUTROABS 11.6*  --   HGB 11.8* 11.9*  HCT 36.0 35.0*  MCV 94.0  --   PLT 208  --     Recent Labs  10/11/15 1856 10/12/15 0053 10/12/15 0608 10/13/15 0940  TROPONINI 9.95* CRITICAL VALUE NOTED.  VALUE IS CONSISTENT WITH PREVIOUSLY REPORTED AND CALLED VALUE. 7.78* 3.32*   Invalid input(s): POCBNP  Recent Labs  10/12/15 0608  HGBA1C 6.7*     Radiology/Studies:  Dg Chest 2 View  Result Date: 10/05/2015 CLINICAL DATA:  Congestion. EXAM: CHEST  2 VIEW COMPARISON:  Radiographs of Jul 12, 2015. FINDINGS: Stable cardiomegaly. Atherosclerosis of thoracic aorta is noted. No pneumothorax or pleural effusion is noted. Multilevel degenerative disc disease is noted in the thoracic spine. Stable minimal right basilar scarring is noted. Mild lingular subsegmental atelectasis is noted. IMPRESSION: Aortic atherosclerosis.  Mild lingular subsegmental atelectasis. Electronically Signed   By: Lupita Raider, M.D.   On: 10/05/2015 14:19   Ct Head Wo  Contrast  Addendum Date: 10/11/2015   ADDENDUM REPORT: 10/11/2015 19:07 ADDENDUM: These results were called by telephone at the time of interpretation on 10/11/2015 at 7:04 pm to Dr. Jacalyn Lefevre , who verbally acknowledged these results. Electronically Signed   By: Gaylyn Rong M.D.   On: 10/11/2015 19:07   Result Date: 10/11/2015 CLINICAL DATA:  Left arm weakness starting at 4 p.m. today. Diabetes and hypertension. EXAM: CT HEAD WITHOUT CONTRAST TECHNIQUE: Contiguous axial images were obtained from the base of the skull through the vertex without intravenous contrast. COMPARISON:  10/31/2014 FINDINGS: Minimal chronic encephalomalacia in the right parietal region, not appreciably changed compared to 10/03/14. Faint linear hypodensity in the right occipital lobe, images 14-15 series 2, also not appreciably changed from 2016 exams. Otherwise, the brainstem, cerebellum, cerebral peduncles, thalami, basal ganglia, basilar cisterns, and ventricular system appear within normal limits. No intracranial hemorrhage, mass lesion, or acute CVA. Mild chronic ethmoid sinusitis. IMPRESSION: 1. No acute intracranial findings. 2. Linear hypodensities in the right occipital and right parietal lobes possibly from remote small infarcts. Not appreciably changed from 10/03/2014. 3. Mild chronic ethmoid sinusitis. Electronically Signed: By: Gaylyn Rong M.D. On: 10/11/2015 19:01   Mr Maxine Glenn Head Wo Contrast  Result Date: 10/12/2015 CLINICAL DATA:  Right parietal stroke EXAM: MRA HEAD WITHOUT CONTRAST TECHNIQUE: Angiographic images of the Circle of Willis were obtained using MRA technique without intravenous contrast. COMPARISON:  MRI head 10/12/2015 FINDINGS: Image quality degraded by intracranial vessel tortuosity. There is decreased signal in the posterior cerebral arteries due to tortuosity. There is also artifact over the MCA branch vessels. Both vertebral arteries patent to the basilar. Left PICA patent. Right PICA not  visualized. Basilar patent. Superior cerebellar arteries are patent. Proximal posterior cerebral arteries are patent. Mid and distal posterior cerebral arteries not visualized due to tortuosity Cavernous carotid widely patent bilaterally. Anterior and middle cerebral arteries are patent without significant stenosis. Distal branches not well visualized due to artifact. IMPRESSION: Exam quality degraded due to artifact and tortuosity. Proximal great vessels in the circle of Willis are widely patent without stenosis. Electronically Signed   By: Marlan Palau M.D.   On: 10/12/2015 11:11   Mr Angiogram Neck W Contrast  Result Date: 10/12/2015 CLINICAL DATA:  Right parietal stroke. EXAM: MRI HEAD AND MRA NECK WITH CONTRAST TECHNIQUE: Multiplanar and multiecho pulse sequences of the neck were obtained with intravenous contrast. Angiographic images of the neck were obtained using MRA technique with intravenous contast. CONTRAST:  20mL MULTIHANCE GADOBENATE DIMEGLUMINE 529 MG/ML IV SOLN COMPARISON:  MRI head 10/12/2015 FINDINGS: Both vertebral arteries widely  patent to the basilar without stenosis Tortuosity of the proximal internal carotid artery bilaterally causing signal loss. No evidence of dissection. Mild irregularity in the right carotid bifurcation with mild stenosis of the right internal and external carotid arteries. Left carotid bifurcation widely patent. IMPRESSION: Tortuosity of the proximal internal carotid artery limits resolution There appears to be mild stenosis at the origin of the right internal and external carotid arteries. Left carotid widely patent.  Both vertebral arteries widely patent. Electronically Signed   By: Marlan Palau M.D.   On: 10/12/2015 11:14   Mr Brain Wo Contrast  Result Date: 10/12/2015 CLINICAL DATA:  CVA.  Left arm weakness EXAM: MRI HEAD WITHOUT CONTRAST TECHNIQUE: Multiplanar, multiecho pulse sequences of the brain and surrounding structures were obtained without  intravenous contrast. COMPARISON:  CT head 10/11/2015 FINDINGS: Acute infarct in the right posterior parietal lobe extending near the occipital lobe. This is likely all in the posterior right MCA territory. Small amount of associated hemorrhage in the posterior portion of the infarct. Based on prior CT is also a small area of chronic infarct in the right parietal cortex in this area. No other areas of acute infarct Basal ganglia and brainstem intact. Mild chronic microvascular ischemic changes in the white matter elsewhere. Negative for mass or edema.  No shift of the midline structures Generalized atrophy. Circle of Willis appears patent. Mild mucosal edema in the paranasal sinuses. Bilateral lens replacement. 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 IMPRESSION: Scattered small areas of acute infarct with small amount of hemorrhage in the right parietal lobe. There is also a small area of chronic infarction in this area. Generalized atrophy. Electronically Signed   By: Marlan Palau M.D.   On: 10/12/2015 11:08   US Carotid Bilateral (at [facility] And [facility] Only)  Result Date: 10/12/2015 CLINICAL DATA:  Left upper extremity weakness.  Previous stroke. EXAM: BILATERAL CAROTID DUPLEX ULTRASOUND TECHNIQUE: Wallace Cullens scale imaging, color Doppler and duplex ultrasound was performed of bilateral carotid and vertebral arteries in the neck. COMPARISON:  MRA from earlier the same day TECHNIQUE: Quantification of carotid stenosis is based on velocity parameters that correlate the residual internal carotid diameter with NASCET-based stenosis levels, using the diameter of the distal internal carotid lumen as the denominator for stenosis measurement. The following velocity measurements were obtained: PEAK SYSTOLIC/END DIASTOLIC RIGHT ICA:                     178/32cm/sec CCA:                     60/7cm/sec SYSTOLIC ICA/CCA RATIO:  3.0 DIASTOLIC ICA/CCA RATIO: 4.6 ECA:                      251cm/sec LEFT ICA:                     176/32cm/sec CCA:                     99/12cm/sec SYSTOLIC ICA/CCA RATIO:  1.8 DIASTOLIC ICA/CCA RATIO: 2.8 ECA:                     120cm/sec FINDINGS: RIGHT CAROTID ARTERY: Scattered calcified nonocclusive plaque through the common carotid artery. Heavy calcified plaque in the carotid bulb extending into proximal ICA and ECA resulting in at least mild stenosis. Normal waveforms. No focal color aliasing on color  Doppler. RIGHT VERTEBRAL ARTERY:  Normal flow direction and waveform. LEFT CAROTID ARTERY: Calcified plaque in the distal common carotid artery, bulb, and proximal ICA resulting in at least mild stenosis. Normal waveforms and color Doppler signal. Distal ICA tortuous. LEFT VERTEBRAL ARTERY: Normal flow direction and waveform. IMPRESSION: 1. Heavily calcified bilateral carotid bifurcation and proximal ICA plaque, resulting in 50-69% diameter stenosis bilaterally. The exam does not exclude plaque ulceration or embolization. Continued surveillance recommended. Electronically Signed   By: Corlis Leak M.D.   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 MRI with small areas of acute infarct with small amount of hemorrhage in right parietal lobe. Ruled in for NSTEMI with troponin of 9.95. (Note troponin >20 in 06/2014 in setting of [place]ubo CM with medically-managed stable CAD by cath at that time.) Also found to have PAF on telemetry this admission as well as 50-69% BICA on duplex. 2D echo 10/12/15: mod LVH, EF 60-65%, grade 1 DD, mild MR. Dr. Wyline Mood felt significant elevated  troponin needs further workup given higher than expected for CVA - he d/w neurology at [facility] who reported area of stroke is small and risk of bleeding with anticoagulation is low. They felt it was OK to proceed with cath and start Eliquis tomorrow after cath.  1. Acute stroke - per neuro, IM. PAF recognized this admission so plans above to start Eliquis post-cath.  2. Elevated troponin with hx of CAD - see above. For cath today. Risks/benefits already discussed with patient yesterday. Cath lab is awaiting pre-cath labs today - labs from [facility] did not show up on lab's orders. They are drawing now. ? If NSTEMI caused from embolization to both cors and brain.  3. PAF - newly recognized this admission, asymptomatic. See above.  4. CKD stage III - await f/u labs this AM. Torsemide and losartan dosing has been pushed out already to resume 8/13 if appropriate.  5. HTN - higher BP occurring in the setting of holding meds above. Appreciate recs from IM regarding permissive HTN.   Signed, Ronie Spies PA-C Pager: [phone]  I have personally seen and examined this patient with Ronie Spies, PA-C. I agree with the assessment and plan as outlined above. She has known CAD with prior stenting of the LAD. Last cath 2016 with 60% prox LAD stenosis, moderate stent restenosis, distal LAD stenosis, severe disease in small non-dominant RCA. Now admitted with acute stroke. No chest pain. Troponin elevated. Pt transferred from [facility] for cath today. She is NPO. No chest pain. Exam shows a normally developed female in NAD, RRR, lungs clear, no LE edema. Labs reviewed. Plan cardiac cath today. Start Eliquis tomorrow as she has had evidence of PAF since hospitalized.   Verne Carrow 10/14/2015 9:48 AM'

## 2015-10-14 NOTE — Interval H&P Note (Signed)
History and Physical Interval Note:  10/14/2015 12:48 PM  [name]  has presented today for surgery, with the diagnosis of cp  The various methods of treatment have been discussed with the patient and family. After consideration of risks, benefits and other options for treatment, the patient has consented to  Procedure(s): Left Heart Cath and Coronary Angiography (N/A) as a surgical intervention .  The patient's history has been reviewed, patient examined, no change in status, stable for surgery.  I have reviewed the patient's chart and labs.  Questions were answered to the patient's satisfaction.    Cath Lab Visit (complete for each Cath Lab visit)  Clinical Evaluation Leading to the Procedure:   ACS: Yes.    Non-ACS:    Anginal Classification: CCS II  Anti-ischemic medical therapy: Minimal Therapy (1 class of medications)  Non-Invasive Test Results: No non-invasive testing performed  Prior CABG: No previous CABG       Theron Aristaeter [facility], SacramentoJordanMD,FACC 10/14/2015 12:48 PM

## 2015-10-14 NOTE — Progress Notes (Signed)
Results of cath noted. D/c further aspirin and Plavix with plans to start Eliquis in AM if cath site and neuro status stable. Heitor Steinhoff PA-C

## 2015-10-14 NOTE — Progress Notes (Signed)
Patient: [name] / Admit Date: 10/11/2015 / Date of Encounter: 10/14/2015, 8:44 AM   Subjective: No CP or SOB. Feels well. Weakness resolved.   Objective: Telemetry: NSR occ PVCs Physical Exam: Blood pressure (!) 159/65, pulse 72, temperature 98.2 F (36.8 C), temperature source Oral, resp. rate (!) 23, height 5\' 3"  (1.6 m), weight 212 lb 8 oz (96.4 kg), SpO2 98 %. General: Well developed, well nourished obese WF in no acute distress. Lying flat in bed without symptoms. Head: Normocephalic, atraumatic, sclera non-icteric, no xanthomas, nares are without discharge. Neck: Negative for carotid bruits. JVP not elevated. Lungs: Clear bilaterally to auscultation without wheezes, rales, or rhonchi. Breathing is unlabored. Heart: RRR S1 S2 without murmurs, rubs, or gallops.  Abdomen: Soft, non-tender, non-distended with normoactive bowel sounds. No rebound/guarding. Extremities: No clubbing or cyanosis. No edema. Distal pedal pulses are 2+ and equal bilaterally. Neuro: Alert and oriented X 3. Moves all extremities spontaneously. Psych:  Responds to questions appropriately with a normal affect.   Intake/Output Summary (Last 24 hours) at 10/14/15 0844 Last data filed at 10/13/15 2129  Gross per 24 hour  Intake              240 ml  Output              300 ml  Net              -60 ml    Inpatient Medications:  . acidophilus  1 capsule Oral Daily  . ALPRAZolam  0.5 mg Oral TID  . aspirin  325 mg Oral Daily  . atorvastatin  40 mg Oral q1800  . bisoprolol  2.5 mg Oral Daily  . budesonide  0.25 mg Nebulization BID  . calcium-vitamin D  1 tablet Oral BID  . citalopram  10 mg Oral Daily  . clopidogrel  75 mg Oral Daily  . enoxaparin (LOVENOX) injection  40 mg Subcutaneous Q24H  . ferrous sulfate  325 mg Oral Q breakfast  . fluticasone  1 spray Each Nare BID  . HYDROcodone-acetaminophen  1 tablet Oral BID  . insulin aspart  0-5 Units Subcutaneous QHS  . insulin aspart  0-9 Units  Subcutaneous TID WC  . insulin aspart protamine- aspart  30 Units Subcutaneous BID WC  . ipratropium-albuterol  3 mL Nebulization QID  . isosorbide mononitrate  30 mg Oral Daily  . [START ON 10/16/2015] losartan  50 mg Oral Daily  . magic mouthwash  5 mL Oral QID  . multivitamin with minerals  1 tablet Oral Daily  . pantoprazole  40 mg Oral Daily  . sodium chloride flush  3 mL Intravenous Q12H  . [START ON 10/16/2015] torsemide  20 mg Oral Daily  . vitamin B-12  1,000 mcg Oral Daily   Infusions:  . sodium chloride 75 mL/hr at 10/13/15 2129  . sodium chloride 1 mL/kg/hr (10/14/15 0700)    Labs:  Recent Labs  10/11/15 1856 10/11/15 1914  NA 137 140  K 4.4 4.3  CL 97* 94*  CO2 32  --   GLUCOSE 270* 259*  BUN 22* 21*  CREATININE 1.52* 1.40*  CALCIUM 9.3  --     Recent Labs  10/11/15 1856  AST 69*  ALT 35  ALKPHOS 63  BILITOT 0.4  PROT 6.8  ALBUMIN 3.8    Recent Labs  10/11/15 1856 10/11/15 1914  WBC 13.7*  --   NEUTROABS 11.6*  --   HGB 11.8* 11.9*  HCT 36.0 35.0*  MCV 94.0  --   PLT 208  --     Recent Labs  10/11/15 1856 10/12/15 0053 10/12/15 0608 10/13/15 0940  TROPONINI 9.95* CRITICAL VALUE NOTED.  VALUE IS CONSISTENT WITH PREVIOUSLY REPORTED AND CALLED VALUE. 7.78* 3.32*   Invalid input(s): POCBNP  Recent Labs  10/12/15 0608  HGBA1C 6.7*     Radiology/Studies:  Dg Chest 2 View  Result Date: 10/05/2015 CLINICAL DATA:  Congestion. EXAM: CHEST  2 VIEW COMPARISON:  Radiographs of Jul 12, 2015. FINDINGS: Stable cardiomegaly. Atherosclerosis of thoracic aorta is noted. No pneumothorax or pleural effusion is noted. Multilevel degenerative disc disease is noted in the thoracic spine. Stable minimal right basilar scarring is noted. Mild lingular subsegmental atelectasis is noted. IMPRESSION: Aortic atherosclerosis.  Mild lingular subsegmental atelectasis. Electronically Signed   By: Lupita Raider, M.D.   On: 10/05/2015 14:19   Ct Head Wo  Contrast  Addendum Date: 10/11/2015   ADDENDUM REPORT: 10/11/2015 19:07 ADDENDUM: These results were called by telephone at the time of interpretation on 10/11/2015 at 7:04 pm to Dr. Jacalyn Lefevre , who verbally acknowledged these results. Electronically Signed   By: Gaylyn Rong M.D.   On: 10/11/2015 19:07   Result Date: 10/11/2015 CLINICAL DATA:  Left arm weakness starting at 4 p.m. today. Diabetes and hypertension. EXAM: CT HEAD WITHOUT CONTRAST TECHNIQUE: Contiguous axial images were obtained from the base of the skull through the vertex without intravenous contrast. COMPARISON:  10/31/2014 FINDINGS: Minimal chronic encephalomalacia in the right parietal region, not appreciably changed compared to 10/03/14. Faint linear hypodensity in the right occipital lobe, images 14-15 series 2, also not appreciably changed from 2016 exams. Otherwise, the brainstem, cerebellum, cerebral peduncles, thalami, basal ganglia, basilar cisterns, and ventricular system appear within normal limits. No intracranial hemorrhage, mass lesion, or acute CVA. Mild chronic ethmoid sinusitis. IMPRESSION: 1. No acute intracranial findings. 2. Linear hypodensities in the right occipital and right parietal lobes possibly from remote small infarcts. Not appreciably changed from 10/03/2014. 3. Mild chronic ethmoid sinusitis. Electronically Signed: By: Gaylyn Rong M.D. On: 10/11/2015 19:01   Mr Maxine Glenn Head Wo Contrast  Result Date: 10/12/2015 CLINICAL DATA:  Right parietal stroke EXAM: MRA HEAD WITHOUT CONTRAST TECHNIQUE: Angiographic images of the Circle of Willis were obtained using MRA technique without intravenous contrast. COMPARISON:  MRI head 10/12/2015 FINDINGS: Image quality degraded by intracranial vessel tortuosity. There is decreased signal in the posterior cerebral arteries due to tortuosity. There is also artifact over the MCA branch vessels. Both vertebral arteries patent to the basilar. Left PICA patent. Right PICA not  visualized. Basilar patent. Superior cerebellar arteries are patent. Proximal posterior cerebral arteries are patent. Mid and distal posterior cerebral arteries not visualized due to tortuosity Cavernous carotid widely patent bilaterally. Anterior and middle cerebral arteries are patent without significant stenosis. Distal branches not well visualized due to artifact. IMPRESSION: Exam quality degraded due to artifact and tortuosity. Proximal great vessels in the circle of Willis are widely patent without stenosis. Electronically Signed   By: Marlan Palau M.D.   On: 10/12/2015 11:11   Mr Angiogram Neck W Contrast  Result Date: 10/12/2015 CLINICAL DATA:  Right parietal stroke. EXAM: MRI HEAD AND MRA NECK WITH CONTRAST TECHNIQUE: Multiplanar and multiecho pulse sequences of the neck were obtained with intravenous contrast. Angiographic images of the neck were obtained using MRA technique with intravenous contast. CONTRAST:  20mL MULTIHANCE GADOBENATE DIMEGLUMINE 529 MG/ML IV SOLN COMPARISON:  MRI head 10/12/2015 FINDINGS: Both vertebral arteries widely  patent to the basilar without stenosis Tortuosity of the proximal internal carotid artery bilaterally causing signal loss. No evidence of dissection. Mild irregularity in the right carotid bifurcation with mild stenosis of the right internal and external carotid arteries. Left carotid bifurcation widely patent. IMPRESSION: Tortuosity of the proximal internal carotid artery limits resolution There appears to be mild stenosis at the origin of the right internal and external carotid arteries. Left carotid widely patent.  Both vertebral arteries widely patent. Electronically Signed   By: Marlan Palau M.D.   On: 10/12/2015 11:14   Mr Brain Wo Contrast  Result Date: 10/12/2015 CLINICAL DATA:  CVA.  Left arm weakness EXAM: MRI HEAD WITHOUT CONTRAST TECHNIQUE: Multiplanar, multiecho pulse sequences of the brain and surrounding structures were obtained without  intravenous contrast. COMPARISON:  CT head 10/11/2015 FINDINGS: Acute infarct in the right posterior parietal lobe extending near the occipital lobe. This is likely all in the posterior right MCA territory. Small amount of associated hemorrhage in the posterior portion of the infarct. Based on prior CT is also a small area of chronic infarct in the right parietal cortex in this area. No other areas of acute infarct Basal ganglia and brainstem intact. Mild chronic microvascular ischemic changes in the white matter elsewhere. Negative for mass or edema.  No shift of the midline structures Generalized atrophy. Circle of Willis appears patent. Mild mucosal edema in the paranasal sinuses. Bilateral lens replacement. 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 IMPRESSION: Scattered small areas of acute infarct with small amount of hemorrhage in the right parietal lobe. There is also a small area of chronic infarction in this area. Generalized atrophy. Electronically Signed   By: Marlan Palau M.D.   On: 10/12/2015 11:08   US Carotid Bilateral (at [facility] Only)  Result Date: 10/12/2015 CLINICAL DATA:  Left upper extremity weakness.  Previous stroke. EXAM: BILATERAL CAROTID DUPLEX ULTRASOUND TECHNIQUE: Wallace Cullens scale imaging, color Doppler and duplex ultrasound was performed of bilateral carotid and vertebral arteries in the neck. COMPARISON:  MRA from earlier the same day TECHNIQUE: Quantification of carotid stenosis is based on velocity parameters that correlate the residual internal carotid diameter with NASCET-based stenosis levels, using the diameter of the distal internal carotid lumen as the denominator for stenosis measurement. The following velocity measurements were obtained: PEAK SYSTOLIC/END DIASTOLIC RIGHT ICA:                     178/32cm/sec CCA:                     60/7cm/sec SYSTOLIC ICA/CCA RATIO:  3.0 DIASTOLIC ICA/CCA RATIO: 4.6 ECA:                      251cm/sec LEFT ICA:                     176/32cm/sec CCA:                     99/12cm/sec SYSTOLIC ICA/CCA RATIO:  1.8 DIASTOLIC ICA/CCA RATIO: 2.8 ECA:                     120cm/sec FINDINGS: RIGHT CAROTID ARTERY: Scattered calcified nonocclusive plaque through the common carotid artery. Heavy calcified plaque in the carotid bulb extending into proximal ICA and ECA resulting in at least mild stenosis. Normal waveforms. No focal color aliasing on color  Doppler. RIGHT VERTEBRAL ARTERY:  Normal flow direction and waveform. LEFT CAROTID ARTERY: Calcified plaque in the distal common carotid artery, bulb, and proximal ICA resulting in at least mild stenosis. Normal waveforms and color Doppler signal. Distal ICA tortuous. LEFT VERTEBRAL ARTERY: Normal flow direction and waveform. IMPRESSION: 1. Heavily calcified bilateral carotid bifurcation and proximal ICA plaque, resulting in 50-69% diameter stenosis bilaterally. The exam does not exclude plaque ulceration or embolization. Continued surveillance recommended. Electronically Signed   By: Corlis Leak M.D.   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 MRI with small areas of acute infarct with small amount of hemorrhage in right parietal lobe. Ruled in for NSTEMI with troponin of 9.95. (Note troponin >20 in 06/2014 in setting of Taktosubo CM with medically-managed stable CAD by cath at that time.) Also found to have PAF on telemetry this admission as well as 50-69% BICA on duplex. 2D echo 10/12/15: mod LVH, EF 60-65%, grade 1 DD, mild MR. Dr. Wyline Mood felt significant elevated  troponin needs further workup given higher than expected for CVA - he d/w neurology at [facility] who reported area of stroke is small and risk of bleeding with anticoagulation is low. They felt it was OK to proceed with cath and start Eliquis tomorrow after cath.  1. Acute stroke - per neuro, IM. PAF recognized this admission so plans above to start Eliquis post-cath.  2. Elevated troponin with hx of CAD - see above. For cath today. Risks/benefits already discussed with patient yesterday. Cath lab is awaiting pre-cath labs today - labs from [facility] did not show up on lab's orders. They are drawing now. ? If NSTEMI caused from embolization to both cors and brain.  3. PAF - newly recognized this admission, asymptomatic. See above.  4. CKD stage III - await f/u labs this AM. Torsemide and losartan dosing has been pushed out already to resume 8/13 if appropriate.  5. HTN - higher BP occurring in the setting of holding meds above. Appreciate recs from IM regarding permissive HTN.   Signed, Ronie Spies PA-C Pager: [phone]  I have personally seen and examined this patient with Ronie Spies, PA-C. I agree with the assessment and plan as outlined above. She has known CAD with prior stenting of the LAD. Last cath 2016 with 60% prox LAD stenosis, moderate stent restenosis, distal LAD stenosis, severe disease in small non-dominant RCA. Now admitted with acute stroke. No chest pain. Troponin elevated. Pt transferred from [facility] for cath today. She is NPO. No chest pain. Exam shows a normally developed female in NAD, RRR, lungs clear, no LE edema. Labs reviewed. Plan cardiac cath today. Start Eliquis tomorrow as she has had evidence of PAF since hospitalized.   Verne Carrow 10/14/2015 9:48 AM'

## 2015-10-15 DIAGNOSIS — Z9861 Coronary angioplasty status: Secondary | ICD-10-CM

## 2015-10-15 LAB — GLUCOSE, CAPILLARY
Glucose-Capillary: 140 mg/dL — ABNORMAL HIGH (ref 65–99)
Glucose-Capillary: 209 mg/dL — ABNORMAL HIGH (ref 65–99)
Glucose-Capillary: 228 mg/dL — ABNORMAL HIGH (ref 65–99)
Glucose-Capillary: 294 mg/dL — ABNORMAL HIGH (ref 65–99)
Glucose-Capillary: 68 mg/dL (ref 65–99)

## 2015-10-15 LAB — BASIC METABOLIC PANEL
Anion gap: 5 (ref 5–15)
BUN: 12 mg/dL (ref 6–20)
CHLORIDE: 102 mmol/L (ref 101–111)
CO2: 33 mmol/L — ABNORMAL HIGH (ref 22–32)
Calcium: 9.5 mg/dL (ref 8.9–10.3)
Creatinine, Ser: 0.92 mg/dL (ref 0.44–1.00)
GFR calc non Af Amer: 59 mL/min — ABNORMAL LOW (ref >60)
Glucose, Bld: 101 mg/dL — ABNORMAL HIGH (ref 65–99)
POTASSIUM: 4.2 mmol/L (ref 3.5–5.1)
SODIUM: 140 mmol/L (ref 135–145)

## 2015-10-15 LAB — CBC
HCT: 36.9 % (ref 36.0–46.0)
HEMOGLOBIN: 11.5 g/dL — AB (ref 12.0–15.0)
MCH: 29.5 pg (ref 26.0–34.0)
MCHC: 31.2 g/dL (ref 30.0–36.0)
MCV: 94.6 fL (ref 78.0–100.0)
PLATELETS: 222 10*3/uL (ref 150–400)
RBC: 3.9 MIL/uL (ref 3.87–5.11)
RDW: 12 % (ref 11.5–15.5)
WBC: 10.1 10*3/uL (ref 4.0–10.5)

## 2015-10-15 MED ORDER — APIXABAN 5 MG PO TABS
5.0000 mg | ORAL_TABLET | Freq: Two times a day (BID) | ORAL | Status: DC
  Administered 2015-10-15 – 2015-10-16 (×3): 5 mg via ORAL
  Filled 2015-10-15 (×3): qty 1

## 2015-10-15 MED ORDER — HYDROCODONE-ACETAMINOPHEN 5-325 MG PO TABS
1.0000 | ORAL_TABLET | Freq: Once | ORAL | Status: AC
  Administered 2015-10-15: 1 via ORAL
  Filled 2015-10-15: qty 1

## 2015-10-15 NOTE — Progress Notes (Signed)
Hypoglycemic Event  CBG: 44  Treatment: Orange Juice   Symptoms: Sweaty   Follow-up CBG: Time: 0000 CBG Result:68  Possible Reasons for Event: Not eating her regular diet  Comments/MD notified: Craige CottaKirby, will continue to monitor.    Susann Givensraven, Meredith

## 2015-10-15 NOTE — Progress Notes (Signed)
   54F with CAD (MI with stenting of the LAD in 2000, repeat PTCA to this lesion in 02/1999, PTCA/brachytherapy to the mid LAD in 2001, prior cath 2016 with mod LAD & severe dz in a nondom RCA), h/o stress induced CM (improved since then), HTN, HLD, prior CVAs, DM, CKD stage III, chronic respiratory failure on home O2, COPD, GERD, anemia, OSA, obesity admitted with RUE weakness and new small CVA. MRI with small areas of acute infarct with small amount of hemorrhage in right parietal lobe. Ruled in for NSTEMI with troponin of 9.95. (Note troponin >20 in 06/2014 in setting of Taktosubo CM with medically-managed stable CAD by cath at that time.) Also found to have PAF on telemetry this admission as well as 50-69% BICA on duplex. 2D echo 10/12/15: mod LVH, EF 60-65%, grade 1 DD, mild MR. Dr. Wyline MoodBranch felt significant elevated troponin needs further workup given higher than expected for CVA - [facility] who reported area of stroke is small and risk of bleeding with anticoagulation is low. They felt it was OK to proceed with cath and start Eliquis tomorrow after cath.  Awakened from sleep. Not communicating fluently. Assume this is chronic.  No cath site complications.  As per plan, med therapy for CAD and okay to start Eliquis.  Please call if we can help further.

## 2015-10-15 NOTE — Progress Notes (Signed)
[facility] PROGRESS NOTE  [name]:[id number] DOB: 09/29/1940 DOA: 10/11/2015 PCP: Toma Deiters, MD  Interim summary and HPI 75 year old female with history of 2 previous CVA's, DM, HTN, HL, CKD 3 and morbid obesity. Patient presents with left upper extremity weakness that started about 5 hours. The patient was deemed to be outside of the window for TPA at presentation. No headache, no SOB, no fever or chills, no chest pain and no GI or urinary symptoms. CT scan has not revealed any acute findings. Troponin is greater than 9 on admission.  Transfered to [facility] from [facility] for Left heart cath on 10/14/15. Work up for stroke essentially completed.   Assessment/Plan: 1-Acute CVA: with scattered ischemic areas affecting parietal lobe (small hemorrhage seen) -neg CT head -2-D echo w/o visible thrombus and carotid duplex demonstrating 50-69% stenosis bilaterally, vertebral flow is antegrade.  -no significant residual deficit at this time -was on baby aspirin prior to admission; neurology was initially planning to do 3 months dual agent with subsequent transition to just plavix. But given episode of PAF on telemetry, plan is for eliquis after cath. After discussion between Dr.Branch and [place] -LDL is 61 and Hba1c is 6.7 -start Eliquis today  2-Elevated troponin -troponin level trending down nadir 3.32 8/10 -Echo results w/o wall motion abnormalities and preserved EF -cardiology consulted and  And s/p LHC 8/11 with 3 vessel disease, medical management recommended -continue Bisoprolol/lipitor and eliquis now  3. P.AFib -noted on tele in [facility] -chads2vasc score is 5 -in NSR now, starting ELiquis per above discussion  3-HTN: -will continue Imdur and bisoprolol  -losartan and torsemide ordered to start on 10/16/15 per Cards  4-type 2 diabetes with nephropathy -continue insulin 70/30, SSI  5-CKD stage 3 -Cr better than baseline -stopped IVF  6-COPD -chronic resp failure on 3L  O2 -stable  7-depression and anxiety  -will continue citalopram and PRN ativan  8-GERD: -will continue PPI  Code Status: Full Family Communication: daughter at bedside  Disposition Plan: home in 1-2days  Consultants:  Cardiology  Neurology   Procedures:  2-D echo pending - Left ventricle: The cavity size was normal. Wall thickness was   increased in a pattern of moderate LVH. Systolic function was   normal. The estimated ejection fraction was in the range of 60%   to 65%. Wall motion was normal; there were no regional wall   motion abnormalities. Doppler parameters are consistent with   abnormal left ventricular relaxation (grade 1 diastolic   dysfunction). - Aortic valve: Mildly calcified annulus. Trileaflet; mildly   thickened leaflets. Valve area (VTI): 1.91 cm^2. Valve area   (Vmax): 1.97 cm^2. Valve area (Vmean): 1.7 cm^2. - Mitral valve: Mildly calcified annulus. Mildly thickened leaflets   . There was mild regurgitation. - Left atrium: The atrium was mildly dilated. - Atrial septum: Poorly visualized. A patent foramen ovale cannot   be excluded. - Technically difficult study. Echocontrast was used to enhance   visualization.   MRI head Scattered small areas of acute infarct with small amount of hemorrhage in the right parietal lobe. There is also a small area of chronic infarction in this area   MRA head and neck -Tortuosity of the proximal internal carotid artery limits resolution -There appears to be mild stenosis at the origin of the right internal and external carotid arteries. -Left carotid widely patent.  Both vertebral arteries widely patent. -Proximal great vessels in the circle of Willis are widely patent without stenosis.  Carotid duplex: 1. Heavily calcified bilateral carotid bifurcation and proximal ICA plaque, resulting in 50-69% diameter stenosis bilaterally. The exam does not exclude plaque ulceration or embolization.  Continued surveillance recommended.  Antibiotics:  None   HPI/Subjective: Feels that left side is back to baseline, no other symptoms.  Objective: Vitals:   10/14/15 2334 10/15/15 0350  BP: (!) 180/82 (!) 181/82  Pulse: 87 77  Resp: 19 20  Temp: 98.2 F (36.8 C) 98.3 F (36.8 C)    Intake/Output Summary (Last 24 hours) at 10/15/15 1204 Last data filed at 10/15/15 0238  Gross per 24 hour  Intake              240 ml  Output              725 ml  Net             -485 ml   Filed Weights   10/11/15 2205 10/14/15 0334 10/15/15 0100  Weight: 97.4 kg (214 lb 12.8 oz) 96.4 kg (212 lb 8 oz) 97.2 kg (214 lb 3.2 oz)    Exam:   General:  AAOx3, obese, no distress  Cardiovascular: S1 and S2, no rubs or gallops  Respiratory: poor air movement  Abdomen: Obese, soft, NT, ND, positive BS  Musculoskeletal: no edema or cyanosis   Data Reviewed: Basic Metabolic Panel:  Recent Labs Lab 10/11/15 1856 10/11/15 1914 10/14/15 0854 10/15/15 0627  NA 137 140 140 140  K 4.4 4.3 4.1 4.2  CL 97* 94* 99* 102  CO2 32  --  33* 33*  GLUCOSE 270* 259* 145* 101*  BUN 22* 21* 16 12  CREATININE 1.52* 1.40* 1.10* 0.92  CALCIUM 9.3  --  9.1 9.5   Liver Function Tests:  Recent Labs Lab 10/11/15 1856  AST 69*  ALT 35  ALKPHOS 63  BILITOT 0.4  PROT 6.8  ALBUMIN 3.8   CBC:  Recent Labs Lab 10/11/15 1856 10/11/15 1914 10/14/15 0854 10/15/15 0627  WBC 13.7*  --  7.2 10.1  NEUTROABS 11.6*  --   --   --   HGB 11.8* 11.9* 10.1* 11.5*  HCT 36.0 35.0* 32.3* 36.9  MCV 94.0  --  92.8 94.6  PLT 208  --  181 222   Cardiac Enzymes:  Recent Labs Lab 10/11/15 1856 10/12/15 0053 10/12/15 0608 10/13/15 0940  TROPONINI 9.95* CRITICAL VALUE NOTED.  VALUE IS CONSISTENT WITH PREVIOUSLY REPORTED AND CALLED VALUE. 7.78* 3.32*   ProBNP (last 3 results)  Recent Labs  07/12/15 1218  PROBNP 84.0    CBG:  Recent Labs Lab 10/14/15 1959 10/14/15 2328 10/15/15 0003  10/15/15 0735 10/15/15 1126  GLUCAP 279* 44* 68 140* 209*   Studies: No results found.  Scheduled Meds: . acidophilus  1 capsule Oral Daily  . ALPRAZolam  0.5 mg Oral TID  . apixaban  5 mg Oral BID  . atorvastatin  40 mg Oral q1800  . bisoprolol  2.5 mg Oral Daily  . budesonide  0.25 mg Nebulization BID  . calcium-vitamin D  1 tablet Oral BID  . citalopram  10 mg Oral Daily  . ferrous sulfate  325 mg Oral Q breakfast  . fluticasone  1 spray Each Nare BID  . HYDROcodone-acetaminophen  1 tablet Oral BID  . insulin aspart  0-5 Units Subcutaneous QHS  . insulin aspart  0-9 Units Subcutaneous TID WC  . insulin aspart protamine- aspart  30 Units Subcutaneous BID WC  . ipratropium-albuterol  3  mL Nebulization QID  . isosorbide mononitrate  30 mg Oral Daily  . [START ON 10/16/2015] losartan  50 mg Oral Daily  . magic mouthwash  5 mL Oral QID  . multivitamin with minerals  1 tablet Oral Daily  . pantoprazole  40 mg Oral Daily  . sodium chloride flush  3 mL Intravenous Q12H  . [START ON 10/16/2015] torsemide  20 mg Oral Daily  . vitamin B-12  1,000 mcg Oral Daily     Time spent: 30 minutes    [facility] Hospital Of AlexandriaJOSEPH,Cohl Behrens  [facility] Pager [phone]. If 7PM-7AM, please contact night-coverage at [email or web address], password [email or web address] 10/15/2015, 12:04 PM  LOS: 4 days

## 2015-10-15 NOTE — Progress Notes (Signed)
ANTICOAGULATION CONSULT NOTE - Follow Up Consult  Pharmacy Consult for apixaban Indication: atrial fibrillation  Allergies  Allergen Reactions  . Codeine Nausea And Vomiting    vomiting  . Terbutaline Sulfate     REACTION: INTOL to terbutaline    Patient Measurements: Height: 5\' 3"  (160 cm) Weight: 214 lb 3.2 oz (97.2 kg) IBW/kg (Calculated) : 52.4  Vital Signs: Temp: 98.3 F (36.8 C) (08/12 0350) Temp Source: Oral (08/12 0350) BP: 181/82 (08/12 0350) Pulse Rate: 77 (08/12 0350)  Labs:  Recent Labs  10/13/15 0940 10/14/15 0854 10/15/15 0627  HGB  --  10.1* 11.5*  HCT  --  32.3* 36.9  PLT  --  181 222  LABPROT  --  12.9  --   INR  --  0.97  --   CREATININE  --  1.10* 0.92  TROPONINI 3.32*  --   --     Estimated Creatinine Clearance: 58.6 mL/min (by C-G formula based on SCr of 0.92 mg/dL).  Assessment: 75 yo F initially presented to [facility] with stroke symptoms.  MRI showed multiple small infarcts including one small hemorrhagic area.  During that admission, pt developed CP and new afib.  [facility]  Per Dr. Jomarie LongsJoseph, Neuro ok with Eliquis post-cath despite small hemorrhagic infarct.  -75 YO, Weight 97.2 kg, creatinine 0.92. -Hgb 11.5 and stable, Plts 222 and stable  Goal of Therapy:  Monitor platelets by anticoagulation protocol: Yes   Plan:  Apixaban 5mg  BID Monitor S/Sx of bleeding  Gwyndolyn KaufmanKai Kang Bernette Redbird(Kenny), PharmD  PGY1 Pharmacy Resident Pager: [phone] 10/15/2015 10:26 AM   I discussed / reviewed the pharmacy note by Dr. Pandora LeiterKang and I agree with the resident's findings and plans as documented.  Toys 'R' UsKimberly Karynn Deblasi, Pharm.D., BCPS Clinical Pharmacist Pager [phone] 10/15/2015 11:30 AM

## 2015-10-16 LAB — CBC
HCT: 31.9 % — ABNORMAL LOW (ref 36.0–46.0)
HEMOGLOBIN: 9.9 g/dL — AB (ref 12.0–15.0)
MCH: 28.9 pg (ref 26.0–34.0)
MCHC: 31 g/dL (ref 30.0–36.0)
MCV: 93 fL (ref 78.0–100.0)
Platelets: 205 10*3/uL (ref 150–400)
RBC: 3.43 MIL/uL — ABNORMAL LOW (ref 3.87–5.11)
RDW: 12.1 % (ref 11.5–15.5)
WBC: 7.1 10*3/uL (ref 4.0–10.5)

## 2015-10-16 LAB — BASIC METABOLIC PANEL
ANION GAP: 8 (ref 5–15)
BUN: 13 mg/dL (ref 6–20)
CALCIUM: 9.6 mg/dL (ref 8.9–10.3)
CO2: 35 mmol/L — ABNORMAL HIGH (ref 22–32)
Chloride: 98 mmol/L — ABNORMAL LOW (ref 101–111)
Creatinine, Ser: 0.97 mg/dL (ref 0.44–1.00)
GFR, EST NON AFRICAN AMERICAN: 56 mL/min — AB (ref >60)
GLUCOSE: 125 mg/dL — AB (ref 65–99)
Potassium: 4 mmol/L (ref 3.5–5.1)
Sodium: 141 mmol/L (ref 135–145)

## 2015-10-16 LAB — GLUCOSE, CAPILLARY
Glucose-Capillary: 158 mg/dL — ABNORMAL HIGH (ref 65–99)
Glucose-Capillary: 222 mg/dL — ABNORMAL HIGH (ref 65–99)

## 2015-10-16 MED ORDER — INSULIN ASPART 100 UNIT/ML FLEXPEN: 5.0000 [IU] | PEN_INJECTOR | Freq: Three times a day (TID) | SUBCUTANEOUS | Status: DC

## 2015-10-16 MED ORDER — APIXABAN 5 MG PO TABS: 5.0000 mg | ORAL_TABLET | Freq: Two times a day (BID) | ORAL | 0 refills | Status: AC

## 2015-10-16 NOTE — Progress Notes (Signed)
Cm spoke to patient at the bedside and she continued to decline and HH services. CM provided the 30 day free eliquis card to patient and explained the program. Brochure and 30 day free card left with patient. Patient denied further needs at this time.

## 2015-10-16 NOTE — Discharge Summary (Signed)
Physician Discharge Summary  [name] ZOX:[id number] DOB: 12/23/1940 DOA: 10/11/2015  PCP: [name] A HASANAJ, MD  Admit date: 10/11/2015 Discharge date: 10/16/2015  Time spent: 35 minutes  Recommendations for Outpatient Follow-up:  1. Dr.McDowell in 2 weeks, started on Eliquis for P.Afib and CVA 2. PCP Dr.Hasanaj in 1-2weeks   Discharge Diagnoses:  Principal Problem:   Acute CVA (cerebrovascular accident) (HCC)   CAD-3vessel disease   Morbid obesity (HCC)   Essential hypertension, benign   CAD S/P percutaneous coronary angioplasty   GERD   CKD (chronic kidney disease), stage III   Chronic hypoxemic respiratory failure (HCC)   PAF-new onset   Elevated troponin   Anemia   Carotid artery disease (HCC)   Discharge Condition: stable  Diet recommendation: heart healthy   Filed Weights   10/14/15 0334 10/15/15 0100 10/16/15 0304  Weight: 96.4 kg (212 lb 8 oz) 97.2 kg (214 lb 3.2 oz) 97.3 kg (214 lb 6.4 oz)    History of present illness:  75 year old female with history of 2 previous CVA's, DM, HTN, HL, CKD 3 and morbid obesity. Patient presents with left upper extremity weakness that started about 5 hours. The patient was deemed to be outside of the window for TPA at presentation. No headache, no SOB, no fever or chills, no chest pain and no GI or urinary symptoms. CT scan has not revealed any acute findings. Troponin is greater than 9 on admission.   Hospital Course:  -Acute CVA: with scattered ischemic areas affecting parietal lobe (small hemorrhage seen), admitted with Left upper extremity weakness, now resolved -neg CT head -2-D echo without visible thrombus and carotid duplex demonstrating 50-69% stenosis bilaterally, vertebral flow is antegrade.  -no significant residual deficit at this time -was on baby aspirin prior to admission; neurology was initially planning to do 3 months dual antiplatelet therapy, but then patient was noted to have PAfib on the tele monitor, hence plan  changed to eliquis after catheterization following discussion between Cardiology Dr.Branch and [facility] -LDL is 61 and Hba1c is 6.7 -started Eliquis today 8/13  2-Elevated troponin -troponin level on admission was 9 without any symptoms, trended down to nadir of 3.32 on 8/10 -Echo results w/o wall motion abnormalities and preserved EF -cardiology consulted and  And s/p LHC 8/11 with 3 vessel disease, medical management recommended -continue Bisoprolol/lipitor and eliquis now  3. P.AFib -noted on tele in [facility] -chads2vasc score is 5 -in NSR now, started ELiquis per above discussion  3-HTN: -will continue Imdur and bisoprolol  -losartan and torsemide ordered to start on 10/16/15 per Cards  4-type 2 diabetes with nephropathy -continue insulin 70/30, SSI  5-CKD stage 3 -Cr better than baseline -stopped IVF  6-COPD -chronic resp failure on 3L O2 -stable  7-depression and anxiety  -continue citalopram and PRN ativan  8-GERD: -continue PPI   Procedures: Left heart cath  Consultations:  Cardiology  Discharge Exam: Vitals:   10/15/15 2010 10/16/15 0533  BP: (!) 136/54 (!) 142/89  Pulse: 63 87  Resp: 18 20  Temp: 98.3 F (36.8 C) 97.9 F (36.6 C)    General: AAOx3 Cardiovascular: S1S2/RRR Respiratory: CTAB  Discharge Instructions   Discharge Instructions    Diet - low sodium heart healthy    Complete by:  As directed   Diet Carb Modified    Complete by:  As directed   Increase activity slowly    Complete by:  As directed     Current Discharge Medication  List    START taking these medications   Details  apixaban (ELIQUIS) 5 MG TABS tablet Take 1 tablet (5 mg total) by mouth 2 (two) times daily. Qty: 60 tablet, Refills: 0      CONTINUE these medications which have CHANGED   Details  insulin aspart (NOVOLOG FLEXPEN) 100 UNIT/ML FlexPen Inject 5 Units into the skin 3 (three) times daily before meals.      CONTINUE these  medications which have NOT CHANGED   Details  albuterol (PROAIR HFA) 108 (90 BASE) MCG/ACT inhaler Inhale 1-2 puffs into the lungs every 6 (six) hours as needed for wheezing or shortness of breath. Qty: 18 g, Refills: 2    albuterol (PROVENTIL) (2.5 MG/3ML) 0.083% nebulizer solution Take 3 mLs (2.5 mg total) by nebulization 4 (four) times daily. Qty: 1080 mL, Refills: 3    ALPRAZolam (XANAX) 0.5 MG tablet Take 1 tablet (0.5 mg total) by mouth 3 (three) times daily. Qty: 90 tablet, Refills: 0    aspirin 81 MG tablet Take 81 mg by mouth daily.    bisoprolol (ZEBETA) 5 MG tablet Take 0.5 tablets (2.5 mg total) by mouth daily. Qty: 45 tablet, Refills: 5    budesonide (PULMICORT) 0.25 MG/2ML nebulizer solution Take 2 mLs (0.25 mg total) by nebulization 2 (two) times daily. Qty: 180 mL, Refills: 11    calcium-vitamin D (OSCAL WITH D) 500-200 MG-UNIT per tablet Take 1 tablet by mouth 2 (two) times daily.      citalopram (CELEXA) 10 MG tablet Take 10 mg by mouth daily.    dextromethorphan-guaiFENesin (MUCINEX DM) 30-600 MG per 12 hr tablet Take 2 tablets by mouth every 12 (twelve) hours.      ferrous sulfate 325 (65 FE) MG tablet Take 325 mg by mouth daily with breakfast.     fluticasone (FLONASE) 50 MCG/ACT nasal spray Place 1 spray into both nostrils 2 times daily at 12 noon and 4 pm.    glucose blood (ONE TOUCH ULTRA TEST) test strip TEST BLOOD SUGAR LEVELS THREE TIMES DAILY Qty: 100 each, Refills: 5    HYDROcodone-acetaminophen (NORCO/VICODIN) 5-325 MG tablet Take 1 tablet by mouth 2 (two) times daily. Qty: 60 tablet, Refills: 0    ibandronate (BONIVA) 150 MG tablet TAKE 1 TABLET EVERY MONTH ON THE SAME DAY Qty: 1 tablet, Refills: 1    insulin aspart protamine - aspart (NOVOLOG MIX 70/30 FLEXPEN) (70-30) 100 UNIT/ML FlexPen Inject 0.25 mLs (25 Units total) into the skin 2 (two) times daily with a meal. E11.8 Qty: 15 mL, Refills: 11    ipratropium (ATROVENT) 0.02 % nebulizer  solution Take 2.5 mLs (500 mcg total) by nebulization 4 (four) times daily. Qty: 1080 mL, Refills: 11    isosorbide mononitrate (IMDUR) 30 MG 24 hr tablet TAKE 1 TABLET DAILY Qty: 90 tablet, Refills: 1    losartan (COZAAR) 50 MG tablet TAKE ONE TABLET BY MOUTH ONCE DAILY Qty: 30 tablet, Refills: 3    Menthol, Topical Analgesic, (BENGAY PAIN RELIEF + MASSAGE) 2.5 % GEL Apply topically. Apply a small amount to affected area as needed    metFORMIN (GLUCOPHAGE) 1000 MG tablet Take 1,000 mg by mouth 2 (two) times daily with a meal.    Multiple Vitamin (MULTIVITAMIN) tablet Take 1 tablet by mouth daily.    nitroGLYCERIN (NITROSTAT) 0.4 MG SL tablet Place 1 tablet (0.4 mg total) under the tongue every 5 (five) minutes as needed. May repeat x3 Qty: 25 tablet, Refills: 6    omeprazole (PRILOSEC)  20 MG capsule TAKE ONE CAPSULE BY MOUTH ONCE DAILY 30 MINUTES BEFORE A MEAL Qty: 30 capsule, Refills: 11    ONE TOUCH LANCETS MISC Check blood sugars two times daily Qty: 100 each, Refills: 6    potassium chloride (K-DUR,KLOR-CON) 10 MEQ tablet TAKE 1 TABLET DAILY Qty: 30 tablet, Refills: 3    Probiotic Product (PROBIOTIC DAILY PO) Take 1 tablet by mouth daily.     sennosides-docusate sodium (SENOKOT-S) 8.6-50 MG tablet Take 1 tablet by mouth daily as needed for constipation.    simvastatin (ZOCOR) 80 MG tablet Take 80 mg by mouth daily.    sodium chloride (OCEAN) 0.65 % SOLN nasal spray Place 1 spray into both nostrils 2 (two) times daily as needed for congestion.    torsemide (DEMADEX) 20 MG tablet Take 1 tablet (20 mg total) by mouth daily. Qty: 30 tablet, Refills: 5    vitamin B-12 (CYANOCOBALAMIN) 1000 MCG tablet Take 1,000 mcg by mouth daily.    vitamin C (ASCORBIC ACID) 500 MG tablet Take 500 mg by mouth daily.      STOP taking these medications     amoxicillin-clavulanate (AUGMENTIN) 875-125 MG tablet      Diphenhyd-Hydrocort-Nystatin (FIRST-DUKES MOUTHWASH) SUSP         Allergies  Allergen Reactions  . Codeine Nausea And Vomiting    vomiting  . Terbutaline Sulfate     REACTION: INTOL to terbutaline   Follow-up Information    Toma Deiters, MD. Schedule an appointment as soon as possible for a visit in 1 week(s).   Specialty:  Internal Medicine Contact information: [address] [place] [address] [phone]        Nona Dell, MD. Schedule an appointment as soon as possible for a visit in 2 week(s).   Specialty:  Cardiology Contact information: [address] [place] [address] [phone]            The results of significant diagnostics from this hospitalization (including imaging, microbiology, ancillary and laboratory) are listed below for reference.    Significant Diagnostic Studies: Dg Chest 2 View  Result Date: 10/05/2015 CLINICAL DATA:  Congestion. EXAM: CHEST  2 VIEW COMPARISON:  Radiographs of Jul 12, 2015. FINDINGS: Stable cardiomegaly. Atherosclerosis of thoracic aorta is noted. No pneumothorax or pleural effusion is noted. Multilevel degenerative disc disease is noted in the thoracic spine. Stable minimal right basilar scarring is noted. Mild lingular subsegmental atelectasis is noted. IMPRESSION: Aortic atherosclerosis.  Mild lingular subsegmental atelectasis. Electronically Signed   By: Lupita Raider, M.D.   On: 10/05/2015 14:19   Ct Head Wo Contrast  Addendum Date: 10/11/2015   ADDENDUM REPORT: 10/11/2015 19:07 ADDENDUM: These results were called by telephone at the time of interpretation on 10/11/2015 at 7:04 pm to Dr. Jacalyn Lefevre , who verbally acknowledged these results. Electronically Signed   By: Gaylyn Rong M.D.   On: 10/11/2015 19:07   Result Date: 10/11/2015 CLINICAL DATA:  Left arm weakness starting at 4 p.m. today. Diabetes and hypertension. EXAM: CT HEAD WITHOUT CONTRAST TECHNIQUE: Contiguous axial images were obtained from the base of the skull through the vertex without intravenous  contrast. COMPARISON:  10/31/2014 FINDINGS: Minimal chronic encephalomalacia in the right parietal region, not appreciably changed compared to 10/03/14. Faint linear hypodensity in the right occipital lobe, images 14-15 series 2, also not appreciably changed from 2016 exams. Otherwise, the brainstem, cerebellum, cerebral peduncles, thalami, basal ganglia, basilar cisterns, and ventricular system appear within normal limits. No  intracranial hemorrhage, mass lesion, or acute CVA. Mild chronic ethmoid sinusitis. IMPRESSION: 1. No acute intracranial findings. 2. Linear hypodensities in the right occipital and right parietal lobes possibly from remote small infarcts. Not appreciably changed from 10/03/2014. 3. Mild chronic ethmoid sinusitis. Electronically Signed: By: Gaylyn Rong M.D. On: 10/11/2015 19:01   Mr Maxine Glenn Head Wo Contrast  Result Date: 10/12/2015 CLINICAL DATA:  Right parietal stroke EXAM: MRA HEAD WITHOUT CONTRAST TECHNIQUE: Angiographic images of the Circle of Willis were obtained using MRA technique without intravenous contrast. COMPARISON:  MRI head 10/12/2015 FINDINGS: Image quality degraded by intracranial vessel tortuosity. There is decreased signal in the posterior cerebral arteries due to tortuosity. There is also artifact over the MCA branch vessels. Both vertebral arteries patent to the basilar. Left PICA patent. Right PICA not visualized. Basilar patent. Superior cerebellar arteries are patent. Proximal posterior cerebral arteries are patent. Mid and distal posterior cerebral arteries not visualized due to tortuosity Cavernous carotid widely patent bilaterally. Anterior and middle cerebral arteries are patent without significant stenosis. Distal branches not well visualized due to artifact. IMPRESSION: Exam quality degraded due to artifact and tortuosity. Proximal great vessels in the circle of Willis are widely patent without stenosis. Electronically Signed   By: Marlan Palau M.D.   On:  10/12/2015 11:11   Mr Angiogram Neck W Contrast  Result Date: 10/12/2015 CLINICAL DATA:  Right parietal stroke. EXAM: MRI HEAD AND MRA NECK WITH CONTRAST TECHNIQUE: Multiplanar and multiecho pulse sequences of the neck were obtained with intravenous contrast. Angiographic images of the neck were obtained using MRA technique with intravenous contast. CONTRAST:  20mL MULTIHANCE GADOBENATE DIMEGLUMINE 529 MG/ML IV SOLN COMPARISON:  MRI head 10/12/2015 FINDINGS: Both vertebral arteries widely patent to the basilar without stenosis Tortuosity of the proximal internal carotid artery bilaterally causing signal loss. No evidence of dissection. Mild irregularity in the right carotid bifurcation with mild stenosis of the right internal and external carotid arteries. Left carotid bifurcation widely patent. IMPRESSION: Tortuosity of the proximal internal carotid artery limits resolution There appears to be mild stenosis at the origin of the right internal and external carotid arteries. Left carotid widely patent.  Both vertebral arteries widely patent. Electronically Signed   By: Marlan Palau M.D.   On: 10/12/2015 11:14   Mr Brain Wo Contrast  Result Date: 10/12/2015 CLINICAL DATA:  CVA.  Left arm weakness EXAM: MRI HEAD WITHOUT CONTRAST TECHNIQUE: Multiplanar, multiecho pulse sequences of the brain and surrounding structures were obtained without intravenous contrast. COMPARISON:  CT head 10/11/2015 FINDINGS: Acute infarct in the right posterior parietal lobe extending near the occipital lobe. This is likely all in the posterior right MCA territory. Small amount of associated hemorrhage in the posterior portion of the infarct. Based on prior CT is also a small area of chronic infarct in the right parietal cortex in this area. No other areas of acute infarct Basal ganglia and brainstem intact. Mild chronic microvascular ischemic changes in the white matter elsewhere. Negative for mass or edema.  No shift of the midline  structures Generalized atrophy. Circle of Willis appears patent. Mild mucosal edema in the paranasal sinuses. Bilateral lens replacement. 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 IMPRESSION: Scattered small areas of acute infarct with small amount of hemorrhage in the right parietal lobe. There is also a small area of chronic infarction in this area. Generalized atrophy. Electronically Signed   By: Marlan Palau M.D.   On: 10/12/2015 11:08  US Carotid Bilateral (at [facility] And [facility] Only)  Result Date: 10/12/2015 CLINICAL DATA:  Left upper extremity weakness.  Previous stroke. EXAM: BILATERAL CAROTID DUPLEX ULTRASOUND TECHNIQUE: Wallace Cullens scale imaging, color Doppler and duplex ultrasound was performed of bilateral carotid and vertebral arteries in the neck. COMPARISON:  MRA from earlier the same day TECHNIQUE: Quantification of carotid stenosis is based on velocity parameters that correlate the residual internal carotid diameter with NASCET-based stenosis levels, using the diameter of the distal internal carotid lumen as the denominator for stenosis measurement. The following velocity measurements were obtained: PEAK SYSTOLIC/END DIASTOLIC RIGHT ICA:                     178/32cm/sec CCA:                     60/7cm/sec SYSTOLIC ICA/CCA RATIO:  3.0 DIASTOLIC ICA/CCA RATIO: 4.6 ECA:                     251cm/sec LEFT ICA:                     176/32cm/sec CCA:                     99/12cm/sec SYSTOLIC ICA/CCA RATIO:  1.8 DIASTOLIC ICA/CCA RATIO: 2.8 ECA:                     120cm/sec FINDINGS: RIGHT CAROTID ARTERY: Scattered calcified nonocclusive plaque through the common carotid artery. Heavy calcified plaque in the carotid bulb extending into proximal ICA and ECA resulting in at least mild stenosis. Normal waveforms. No focal color aliasing on color Doppler. RIGHT VERTEBRAL ARTERY:  Normal flow direction and waveform. LEFT CAROTID ARTERY: Calcified plaque in the distal common  carotid artery, bulb, and proximal ICA resulting in at least mild stenosis. Normal waveforms and color Doppler signal. Distal ICA tortuous. LEFT VERTEBRAL ARTERY: Normal flow direction and waveform. IMPRESSION: 1. Heavily calcified bilateral carotid bifurcation and proximal ICA plaque, resulting in 50-69% diameter stenosis bilaterally. The exam does not exclude plaque ulceration or embolization. Continued surveillance recommended. Electronically Signed   By: Corlis Leak M.D.   On: 10/12/2015 14:31    Microbiology: Recent Results (from the past 240 hour(s))  MRSA PCR Screening     Status: None   Collection Time: 10/12/15  8:45 PM  Result Value Ref Range Status   MRSA by PCR NEGATIVE NEGATIVE Final    Comment:        The GeneXpert MRSA Assay (FDA approved for NASAL specimens only), is one component of a comprehensive MRSA colonization surveillance program. It is not intended to diagnose MRSA infection nor to guide or monitor treatment for MRSA infections.      Labs: Basic Metabolic Panel:  Recent Labs Lab 10/11/15 1856 10/11/15 1914 10/14/15 0854 10/15/15 0627 10/16/15 0407  NA 137 140 140 140 141  K 4.4 4.3 4.1 4.2 4.0  CL 97* 94* 99* 102 98*  CO2 32  --  33* 33* 35*  GLUCOSE 270* 259* 145* 101* 125*  BUN 22* 21* 16 12 13   CREATININE 1.52* 1.40* 1.10* 0.92 0.97  CALCIUM 9.3  --  9.1 9.5 9.6   Liver Function Tests:  Recent Labs Lab 10/11/15 1856  AST 69*  ALT 35  ALKPHOS 63  BILITOT 0.4  PROT 6.8  ALBUMIN 3.8   No results for input(s): LIPASE, AMYLASE in the last 168 hours.  No results for input(s): AMMONIA in the last 168 hours. CBC:  Recent Labs Lab 10/11/15 1856 10/11/15 1914 10/14/15 0854 10/15/15 0627 10/16/15 0407  WBC 13.7*  --  7.2 10.1 7.1  NEUTROABS 11.6*  --   --   --   --   HGB 11.8* 11.9* 10.1* 11.5* 9.9*  HCT 36.0 35.0* 32.3* 36.9 31.9*  MCV 94.0  --  92.8 94.6 93.0  PLT 208  --  181 222 205   Cardiac Enzymes:  Recent Labs Lab  10/11/15 1856 10/12/15 0053 10/12/15 0608 10/13/15 0940  TROPONINI 9.95* CRITICAL VALUE NOTED.  VALUE IS CONSISTENT WITH PREVIOUSLY REPORTED AND CALLED VALUE. 7.78* 3.32*   BNP: BNP (last 3 results) No results for input(s): BNP in the last 8760 hours.  ProBNP (last 3 results)  Recent Labs  07/12/15 1218  PROBNP 84.0    CBG:  Recent Labs Lab 10/15/15 0735 10/15/15 1126 10/15/15 1658 10/15/15 2016 10/16/15 0811  GLUCAP 140* 209* 294* 228* 158*       SignedZannie Cove MD.  [facility] 10/16/2015, 9:48 AM

## 2015-10-26 ENCOUNTER — Ambulatory Visit (INDEPENDENT_AMBULATORY_CARE_PROVIDER_SITE_OTHER): Payer: Medicare HMO | Admitting: Family Medicine

## 2015-10-26 VITALS — BP 140/73 | HR 72 | Temp 97.8°F | Ht 63.0 in | Wt 216.0 lb

## 2015-10-26 DIAGNOSIS — J9611 Chronic respiratory failure with hypoxia: Secondary | ICD-10-CM | POA: Diagnosis not present

## 2015-10-26 DIAGNOSIS — I10 Essential (primary) hypertension: Secondary | ICD-10-CM | POA: Diagnosis not present

## 2015-10-26 DIAGNOSIS — I255 Ischemic cardiomyopathy: Secondary | ICD-10-CM

## 2015-10-26 NOTE — Progress Notes (Signed)
Subjective:  Patient ID: [name], female    DOB: 06/10/1940  Age: 75 y.o. MRN: [id number]  CC: Hospitalization Follow-up (stroke)   HPI [name] presents for Follow-up of hospitalization recently for right hemispheric parietal stroke affecting the left upper extremity. Discharge summary reviewed states that that resolved during hospitalization. However, she was noted to have several areas of ischemia on the MRI as well as a small bleed. She was started on Eliquis due to onset of paroxysmal atrial fibrillation during her hospital stay. Additionally she underwent left heart catheterization and was shown to have three-vessel disease but medical management was recommended. Noted in her history is that she has had angioplasty with stent placement on multiple occasions in the past. Her troponin level was noted to be elevated at time of admission. It drops to 3 during her stay.   History [name] has a past medical history of Anemia; Anxiety; Asthma; Carotid artery disease ([place]) (10/14/2015); Chronic respiratory failure (HCC); COPD (chronic obstructive pulmonary disease) ([place]); Coronary atherosclerosis of native coronary artery; DDD (degenerative disc disease), lumbar; Essential hypertension, benign; GERD (gastroesophageal reflux disease); Herpes zoster (06/2013); History of bleeding ulcers; Hyperlipidemia; Myocardial infarction ([place]) (2000); Obesity; On home oxygen therapy; OSA (obstructive sleep apnea); Osteoporosis; Takotsubo cardiomyopathy; Type 2 diabetes mellitus ([place]); and Venous insufficiency.   She has a past surgical history that includes Total abdominal hysterectomy; Shoulder open rotator cuff repair (Right); Cataract extraction w/PHACO (Left, 06/30/2012); transthoracic echocardiogram (06/2013); Alveoloplasty (10/1999); Cardiac catheterization (07/2012); Coronary angioplasty with stent; Coronary angioplasty with stent (2000; 2001; 2002); Tonsillectomy (1949); Laparoscopic cholecystectomy; Cataract  extraction w/ intraocular lens implant (Right, 2015?); left heart catheterization with coronary angiogram (N/A, 06/25/2014); and Cardiac catheterization (N/A, 10/14/2015).   Her family history includes Coronary artery disease in her other; Heart attack in her brother and father; Stroke in her brother, mother, and sister.She reports that she quit smoking about 17 years ago. Her smoking use included Cigarettes. She has a 30.00 pack-year smoking history. She has never used smokeless tobacco. She reports that she does not drink alcohol or use drugs.    ROS Review of Systems  Constitutional: Negative for activity change, appetite change and fever.  HENT: Negative for congestion, rhinorrhea and sore throat.   Eyes: Negative for visual disturbance.  Respiratory: Negative for cough and shortness of breath.   Cardiovascular: Negative for chest pain and palpitations.  Gastrointestinal: Negative for abdominal pain, diarrhea and nausea.  Genitourinary: Negative for dysuria.  Musculoskeletal: Negative for arthralgias and myalgias.    Objective:  BP 140/73 (BP Location: Left Arm, Patient Position: Sitting, Cuff Size: Normal)   Pulse 72   Temp 97.8 F (36.6 C) (Oral)   Ht 5' 3"  (1.6 m)   Wt 216 lb (98 kg)   SpO2 97% Comment: 3 lpm O2  BMI 38.26 kg/m   BP Readings from Last 3 Encounters:  10/26/15 140/73  10/16/15 (!) 142/89  10/05/15 125/67    Wt Readings from Last 3 Encounters:  10/26/15 216 lb (98 kg)  10/16/15 214 lb 6.4 oz (97.3 kg)  10/05/15 220 lb (99.8 kg)     Physical Exam  Constitutional: She is oriented to person, place, and time. She appears well-developed and well-nourished. No distress.  HENT:  Head: Normocephalic and atraumatic.  Right Ear: External ear normal.  Left Ear: External ear normal.  Nose: Nose normal.  Mouth/Throat: Oropharynx is clear and moist.  Eyes: Conjunctivae and EOM are normal. Pupils are equal, round, and reactive to light.  Neck: Normal range of  motion. Neck supple. No thyromegaly present.  Cardiovascular: Normal rate, regular rhythm and normal heart sounds.   No murmur heard. Pulmonary/Chest: Effort normal and breath sounds normal. No respiratory distress. She has no wheezes. She has no rales.  Abdominal: Soft. Bowel sounds are normal. She exhibits no distension. There is no tenderness.  Lymphadenopathy:    She has no cervical adenopathy.  Neurological: She is alert and oriented to person, place, and time. She has normal reflexes.  Skin: Skin is warm and dry.  Psychiatric: She has a normal mood and affect. Her behavior is normal. Judgment and thought content normal.     Lab Results  Component Value Date   WBC 8.2 10/26/2015   HGB 9.9 (L) 10/16/2015   HCT 34.0 10/26/2015   PLT 260 10/26/2015   GLUCOSE 124 (H) 10/26/2015   CHOL 146 10/12/2015   TRIG 265 (H) 10/12/2015   HDL 32 (L) 10/12/2015   LDLDIRECT 73.9 07/16/2012   LDLCALC 61 10/12/2015   ALT 21 10/26/2015   AST 16 10/26/2015   NA 145 (H) 10/26/2015   K 4.3 10/26/2015   CL 98 10/26/2015   CREATININE 1.11 (H) 10/26/2015   BUN 17 10/26/2015   CO2 30 (H) 10/26/2015   TSH 1.820 10/05/2015   INR 0.97 10/14/2015   HGBA1C 6.7 (H) 10/12/2015   MICROALBUR 0.8 06/18/2013    Ct Head Wo Contrast  Addendum Date: 10/11/2015   ADDENDUM REPORT: 10/11/2015 19:07 ADDENDUM: These results were called by telephone at the time of interpretation on 10/11/2015 at 7:04 pm to Dr. Isla Pence , who verbally acknowledged these results. Electronically Signed   By: Van Clines M.D.   On: 10/11/2015 19:07   Result Date: 10/11/2015 CLINICAL DATA:  Left arm weakness starting at 4 p.m. today. Diabetes and hypertension. EXAM: CT HEAD WITHOUT CONTRAST TECHNIQUE: Contiguous axial images were obtained from the base of the skull through the vertex without intravenous contrast. COMPARISON:  10/31/2014 FINDINGS: Minimal chronic encephalomalacia in the right parietal region, not appreciably  changed compared to 10/03/14. Faint linear hypodensity in the right occipital lobe, images 14-15 series 2, also not appreciably changed from 2016 exams. Otherwise, the brainstem, cerebellum, cerebral peduncles, thalami, basal ganglia, basilar cisterns, and ventricular system appear within normal limits. No intracranial hemorrhage, mass lesion, or acute CVA. Mild chronic ethmoid sinusitis. IMPRESSION: 1. No acute intracranial findings. 2. Linear hypodensities in the right occipital and right parietal lobes possibly from remote small infarcts. Not appreciably changed from 10/03/2014. 3. Mild chronic ethmoid sinusitis. Electronically Signed: By: Van Clines M.D. On: 10/11/2015 19:01   Mr Jodene Nam Head Wo Contrast  Result Date: 10/12/2015 CLINICAL DATA:  Right parietal stroke EXAM: MRA HEAD WITHOUT CONTRAST TECHNIQUE: Angiographic images of the Circle of Willis were obtained using MRA technique without intravenous contrast. COMPARISON:  MRI head 10/12/2015 FINDINGS: Image quality degraded by intracranial vessel tortuosity. There is decreased signal in the posterior cerebral arteries due to tortuosity. There is also artifact over the MCA branch vessels. Both vertebral arteries patent to the basilar. Left PICA patent. Right PICA not visualized. Basilar patent. Superior cerebellar arteries are patent. Proximal posterior cerebral arteries are patent. Mid and distal posterior cerebral arteries not visualized due to tortuosity Cavernous carotid widely patent bilaterally. Anterior and middle cerebral arteries are patent without significant stenosis. Distal branches not well visualized due to artifact. IMPRESSION: Exam quality degraded due to artifact and tortuosity. Proximal great vessels in the circle of Willis are widely  patent without stenosis. Electronically Signed   By: Franchot Gallo M.D.   On: 10/12/2015 11:11   Mr Angiogram Neck W Contrast  Result Date: 10/12/2015 CLINICAL DATA:  Right parietal stroke. EXAM: MRI  HEAD AND MRA NECK WITH CONTRAST TECHNIQUE: Multiplanar and multiecho pulse sequences of the neck were obtained with intravenous contrast. Angiographic images of the neck were obtained using MRA technique with intravenous contast. CONTRAST:  27m MULTIHANCE GADOBENATE DIMEGLUMINE 529 MG/ML IV SOLN COMPARISON:  MRI head 10/12/2015 FINDINGS: Both vertebral arteries widely patent to the basilar without stenosis Tortuosity of the proximal internal carotid artery bilaterally causing signal loss. No evidence of dissection. Mild irregularity in the right carotid bifurcation with mild stenosis of the right internal and external carotid arteries. Left carotid bifurcation widely patent. IMPRESSION: Tortuosity of the proximal internal carotid artery limits resolution There appears to be mild stenosis at the origin of the right internal and external carotid arteries. Left carotid widely patent.  Both vertebral arteries widely patent. Electronically Signed   By: CFranchot GalloM.D.   On: 10/12/2015 11:14   Mr Brain Wo Contrast  Result Date: 10/12/2015 CLINICAL DATA:  CVA.  Left arm weakness EXAM: MRI HEAD WITHOUT CONTRAST TECHNIQUE: Multiplanar, multiecho pulse sequences of the brain and surrounding structures were obtained without intravenous contrast. COMPARISON:  CT head 10/11/2015 FINDINGS: Acute infarct in the right posterior parietal lobe extending near the occipital lobe. This is likely all in the posterior right MCA territory. Small amount of associated hemorrhage in the posterior portion of the infarct. Based on prior CT is also a small area of chronic infarct in the right parietal cortex in this area. No other areas of acute infarct Basal ganglia and brainstem intact. Mild chronic microvascular ischemic changes in the white matter elsewhere. Negative for mass or edema.  No shift of the midline structures Generalized atrophy. Circle of Willis appears patent. Mild mucosal edema in the paranasal sinuses. Bilateral lens  replacement. 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 IMPRESSION: Scattered small areas of acute infarct with small amount of hemorrhage in the right parietal lobe. There is also a small area of chronic infarction in this area. Generalized atrophy. Electronically Signed   By: CFranchot GalloM.D.   On: 10/12/2015 11:08   [place] Bilateral (at [facility])  Result Date: 10/12/2015 CLINICAL DATA:  Left upper extremity weakness.  Previous stroke. EXAM: BILATERAL CAROTID DUPLEX ULTRASOUND TECHNIQUE: GPearline Cablesscale imaging, color Doppler and duplex ultrasound was performed of bilateral carotid and vertebral arteries in the neck. COMPARISON:  MRA from earlier the same day TECHNIQUE: Quantification of carotid stenosis is based on velocity parameters that correlate the residual internal carotid diameter with NASCET-based stenosis levels, using the diameter of the distal internal carotid lumen as the denominator for stenosis measurement. The following velocity measurements were obtained: PEAK SYSTOLIC/END DIASTOLIC RIGHT ICA:                     178/32cm/sec CCA:                     656/2BW/LSLSYSTOLIC ICA/CCA RATIO:  3.0 DIASTOLIC ICA/CCA RATIO: 4.6 ECA:                     251cm/sec LEFT ICA:                     176/32cm/sec CCA:  62/83MO/QHU SYSTOLIC ICA/CCA RATIO:  1.8 DIASTOLIC ICA/CCA RATIO: 2.8 ECA:                     120cm/sec FINDINGS: RIGHT CAROTID ARTERY: Scattered calcified nonocclusive plaque through the common carotid artery. Heavy calcified plaque in the carotid bulb extending into proximal ICA and ECA resulting in at least mild stenosis. Normal waveforms. No focal color aliasing on color Doppler. RIGHT VERTEBRAL ARTERY:  Normal flow direction and waveform. LEFT CAROTID ARTERY: Calcified plaque in the distal common carotid artery, bulb, and proximal ICA resulting in at least mild stenosis. Normal waveforms and color Doppler signal. Distal  ICA tortuous. LEFT VERTEBRAL ARTERY: Normal flow direction and waveform. IMPRESSION: 1. Heavily calcified bilateral carotid bifurcation and proximal ICA plaque, resulting in 50-69% diameter stenosis bilaterally. The exam does not exclude plaque ulceration or embolization. Continued surveillance recommended. Electronically Signed   By: Lucrezia Europe M.D.   On: 10/12/2015 14:31    Assessment & Plan:   Tanecia was seen today for hospitalization follow-up.  Diagnoses and all orders for this visit:  Chronic hypoxemic respiratory failure ([place]) -     CBC with Differential/Platelet -     CMP14+EGFR  Cardiomyopathy, ischemic -     CBC with Differential/Platelet -     CMP14+EGFR -     Brain natriuretic peptide -     Cancel: Troponin I  Essential hypertension, benign -     CBC with Differential/Platelet -     CMP14+EGFR  Other orders -     Troponin I    I have discontinued [name]'s insulin aspart. I am also having her maintain her calcium-vitamin D, dextromethorphan-guaiFENesin, ferrous sulfate, Probiotic Product (PROBIOTIC DAILY PO), aspirin, nitroGLYCERIN, ONE TOUCH LANCETS, vitamin C, glucose blood, ibandronate, losartan, isosorbide mononitrate, sodium chloride, insulin aspart protamine - aspart, ALPRAZolam, multivitamin, potassium chloride, simvastatin, albuterol, albuterol, vitamin B-12, fluticasone, metFORMIN, citalopram, Menthol (Topical Analgesic), sennosides-docusate sodium, budesonide, ipratropium, HYDROcodone-acetaminophen, bisoprolol, torsemide, omeprazole, and apixaban.  No orders of the defined types were placed in this encounter.    Follow-up: Return in about 3 months (around 01/26/2016), or if symptoms worsen or fail to improve.  Claretta Fraise, M.D.

## 2015-10-27 LAB — CBC WITH DIFFERENTIAL/PLATELET
BASOS ABS: 0 10*3/uL (ref 0.0–0.2)
BASOS: 0 %
EOS (ABSOLUTE): 0.2 10*3/uL (ref 0.0–0.4)
Eos: 3 %
HEMOGLOBIN: 10.7 g/dL — AB (ref 11.1–15.9)
Hematocrit: 34 % (ref 34.0–46.6)
IMMATURE GRANS (ABS): 0 10*3/uL (ref 0.0–0.1)
IMMATURE GRANULOCYTES: 0 %
LYMPHS: 28 %
Lymphocytes Absolute: 2.3 10*3/uL (ref 0.7–3.1)
MCH: 29.1 pg (ref 26.6–33.0)
MCHC: 31.5 g/dL (ref 31.5–35.7)
MCV: 92 fL (ref 79–97)
MONOCYTES: 9 %
Monocytes Absolute: 0.7 10*3/uL (ref 0.1–0.9)
NEUTROS ABS: 4.9 10*3/uL (ref 1.4–7.0)
NEUTROS PCT: 60 %
Platelets: 260 10*3/uL (ref 150–379)
RBC: 3.68 x10E6/uL — ABNORMAL LOW (ref 3.77–5.28)
RDW: 13 % (ref 12.3–15.4)
WBC: 8.2 10*3/uL (ref 3.4–10.8)

## 2015-10-27 LAB — CMP14+EGFR
A/G RATIO: 2 (ref 1.2–2.2)
ALBUMIN: 4.2 g/dL (ref 3.5–4.8)
ALT: 21 IU/L (ref 0–32)
AST: 16 IU/L (ref 0–40)
Alkaline Phosphatase: 64 IU/L (ref 39–117)
BUN / CREAT RATIO: 15 (ref 12–28)
BUN: 17 mg/dL (ref 8–27)
CHLORIDE: 98 mmol/L (ref 96–106)
CO2: 30 mmol/L — ABNORMAL HIGH (ref 18–29)
Calcium: 9.4 mg/dL (ref 8.7–10.3)
Creatinine, Ser: 1.11 mg/dL — ABNORMAL HIGH (ref 0.57–1.00)
GFR calc non Af Amer: 49 mL/min/{1.73_m2} — ABNORMAL LOW (ref >59)
GFR, EST AFRICAN AMERICAN: 56 mL/min/{1.73_m2} — AB (ref >59)
GLOBULIN, TOTAL: 2.1 g/dL (ref 1.5–4.5)
GLUCOSE: 124 mg/dL — AB (ref 65–99)
POTASSIUM: 4.3 mmol/L (ref 3.5–5.2)
Sodium: 145 mmol/L — ABNORMAL HIGH (ref 134–144)
TOTAL PROTEIN: 6.3 g/dL (ref 6.0–8.5)

## 2015-10-27 LAB — TROPONIN I: Troponin I: 0.01 ng/mL (ref 0.00–0.04)

## 2015-10-27 LAB — BRAIN NATRIURETIC PEPTIDE: BNP: 188 pg/mL — ABNORMAL HIGH (ref 0.0–100.0)

## 2015-10-31 ENCOUNTER — Encounter: Payer: Medicare HMO | Admitting: Cardiology

## 2015-11-01 ENCOUNTER — Ambulatory Visit: Payer: Medicare HMO | Admitting: Family Medicine

## 2015-11-04 DEATH — deceased

## 2015-12-07 ENCOUNTER — Ambulatory Visit: Payer: Medicare HMO | Admitting: Family Medicine

## 2016-04-29 IMAGING — CR DG CHEST 2V
2 series · 2 of 2 positions shown · non-contrast
Comparison: Plain film 08/29/2013, 06/22/2014

CLINICAL DATA: 73-year-old female with a history of shortness of
breath for 3 days

EXAM:
CHEST - 2 VIEW

[w chest lat]
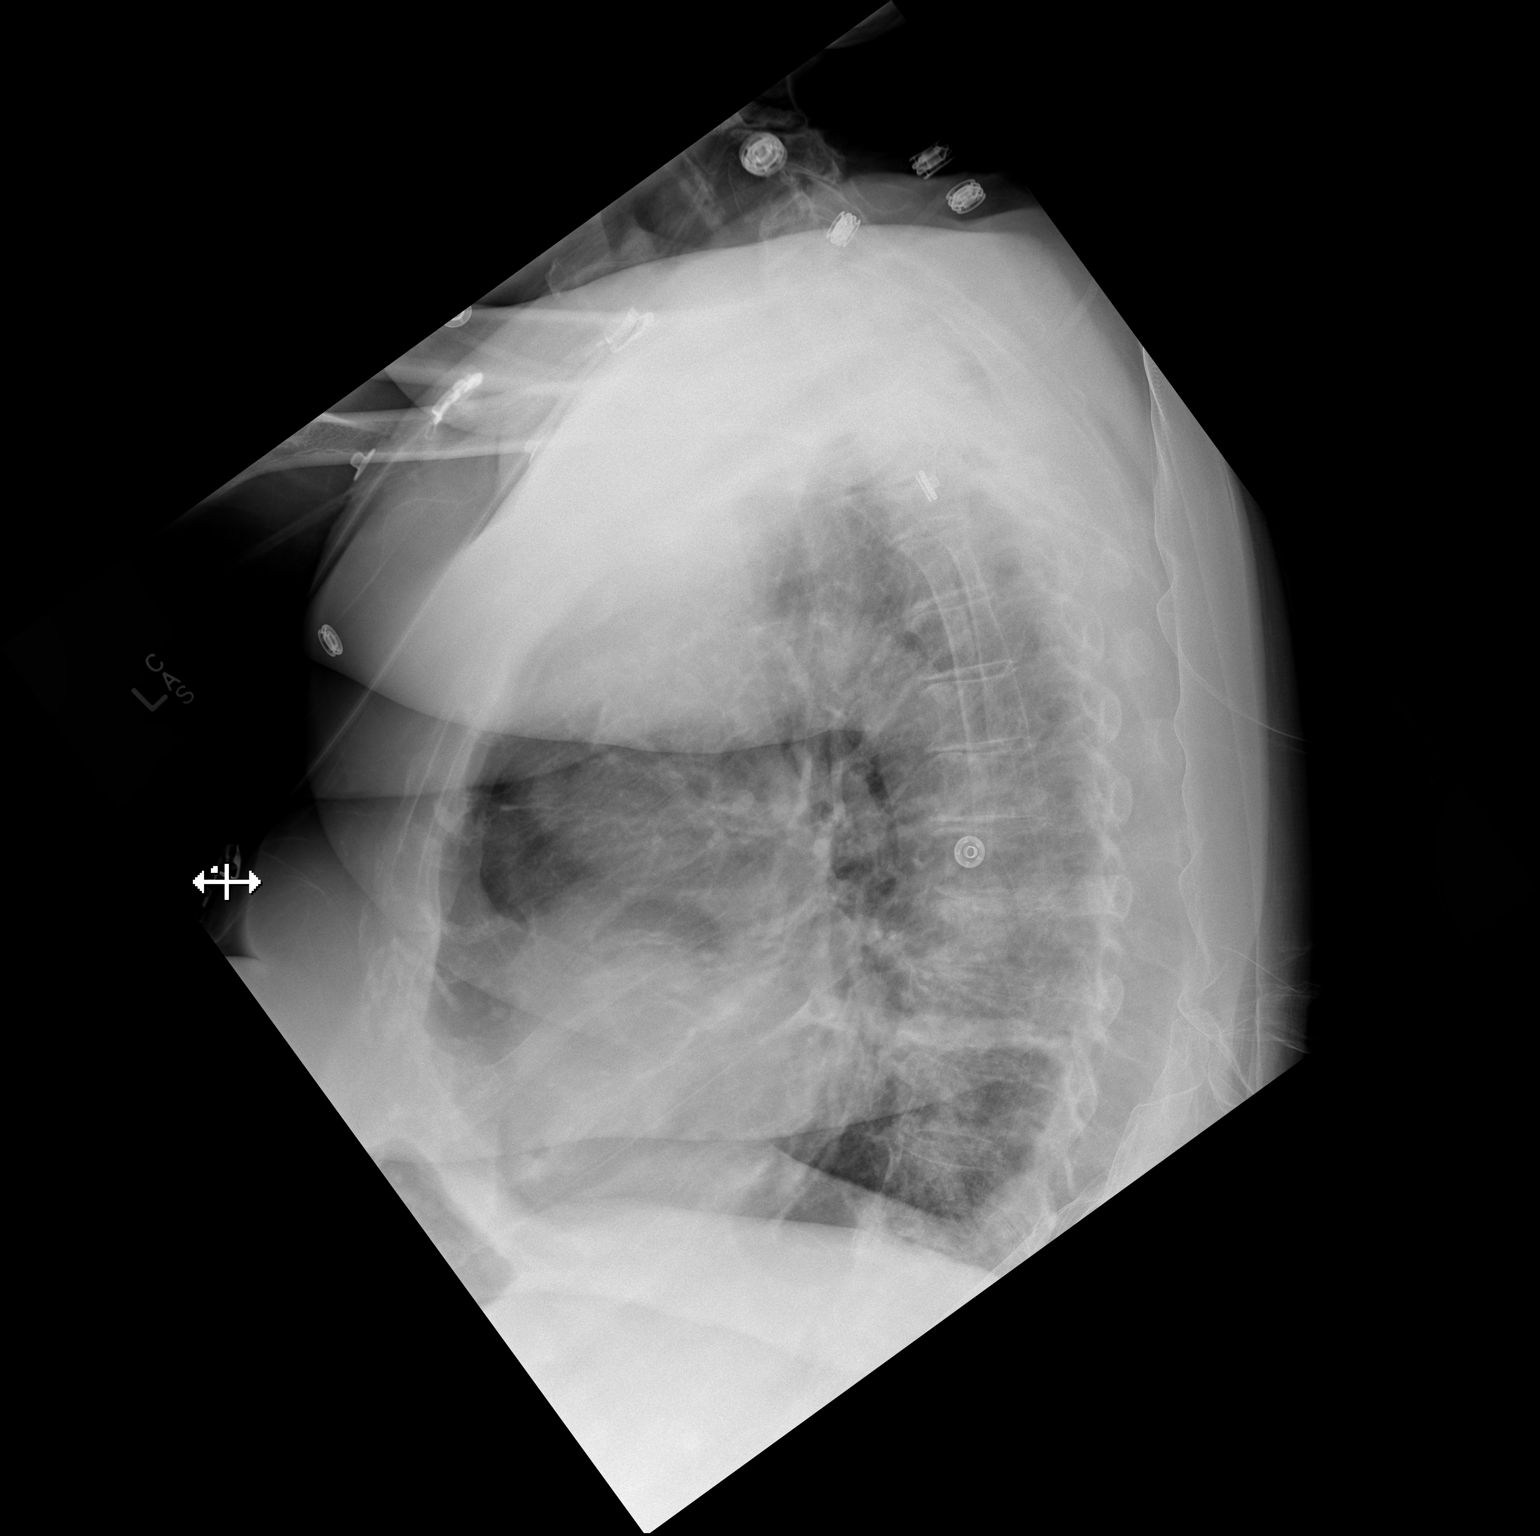

[x chest ap]
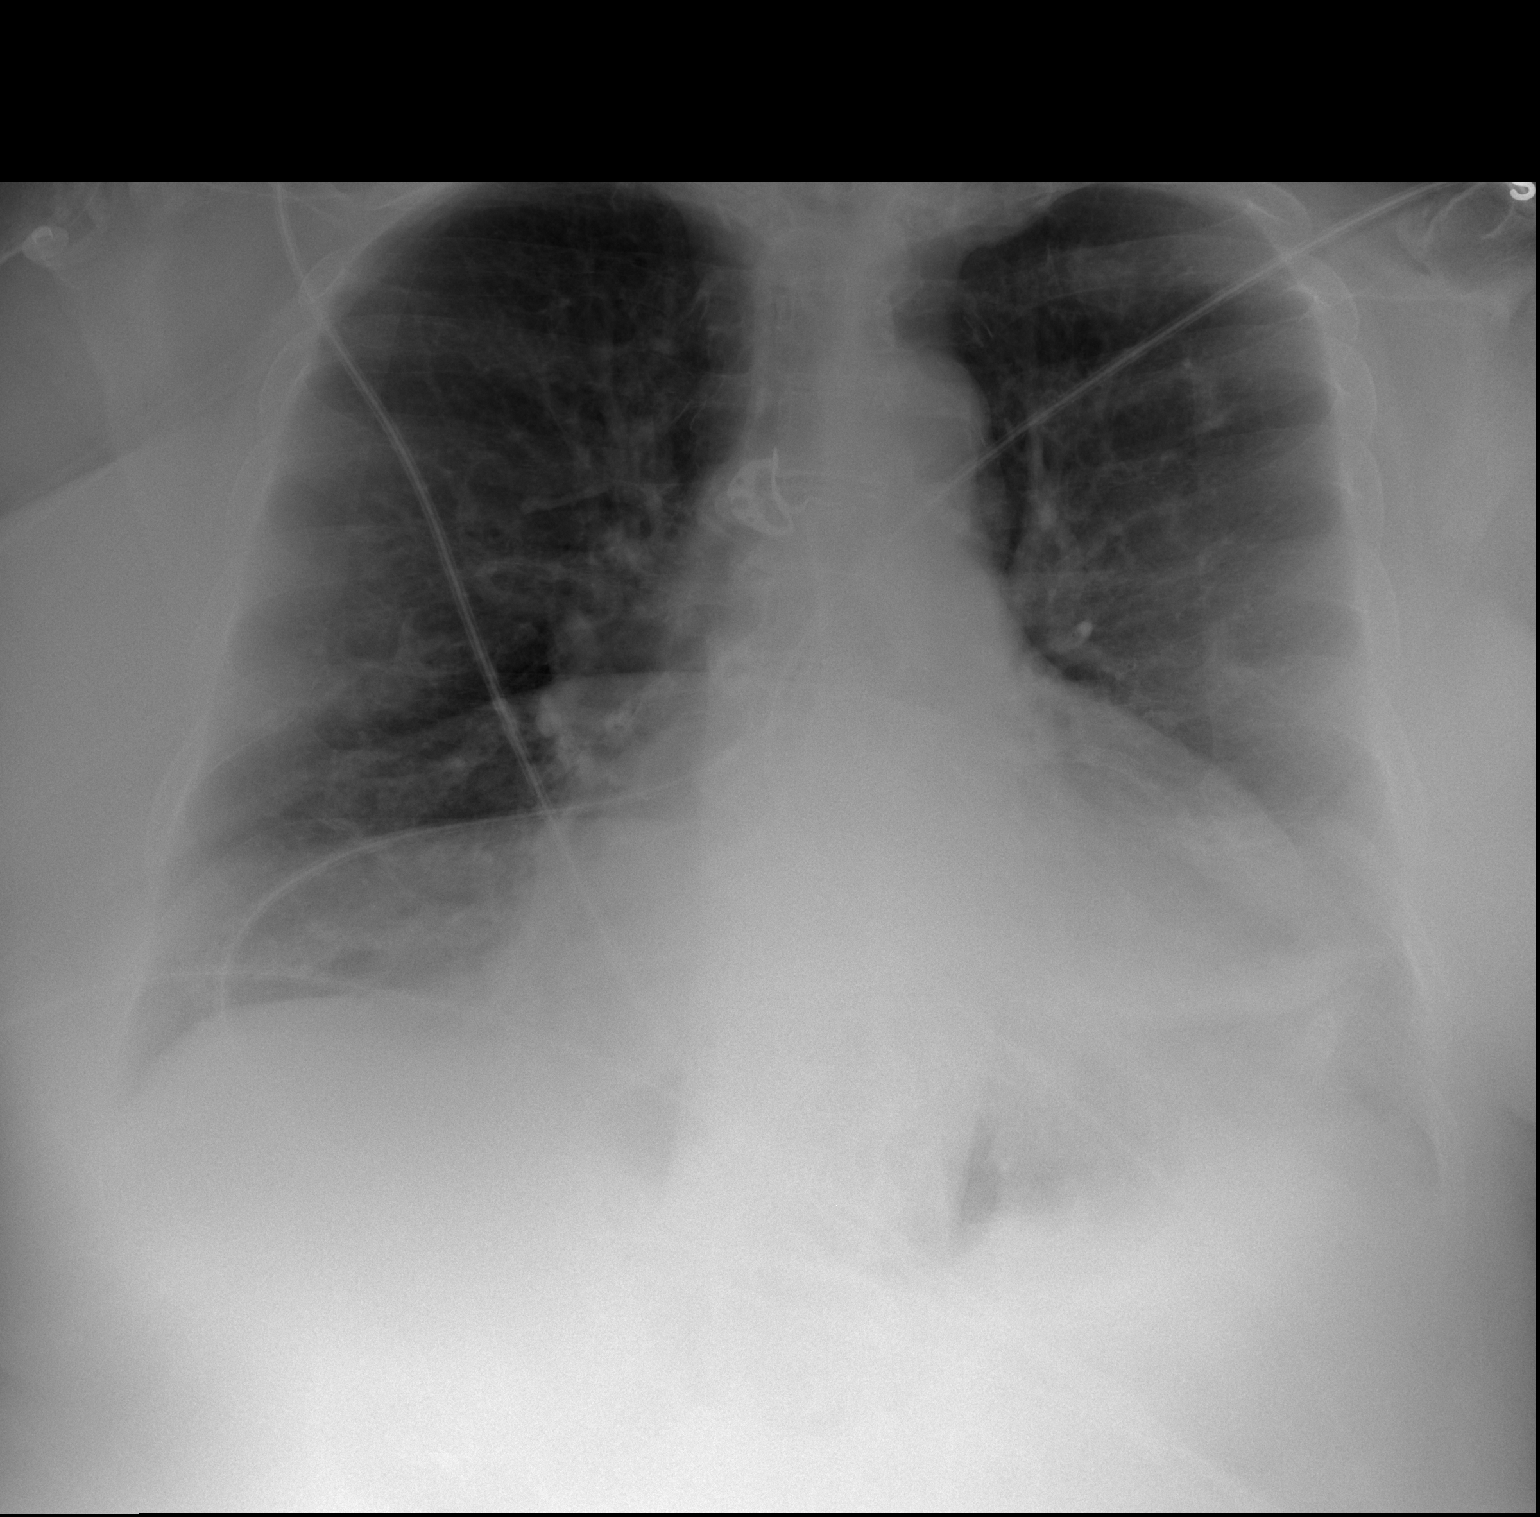

[2 of 2 positions shown; findings below may reference images not displayed]

FINDINGS: Cardiomediastinal silhouette unchanged in size and contour. Coronary
stents in place.

Similar appearance of mild interstitial and airspace opacities.

No pleural effusion or pneumothorax.

No displaced fracture.

Unremarkable appearance of the upper abdomen.
IMPRESSION: Mixed interstitial and airspace opacities are nonspecific,
potentially representing edema, atelectasis, and/ or atypical
infection/consolidation.

Coronary artery disease.

## 2016-04-30 IMAGING — CR DG CHEST 1V PORT
1 series · 1 of 1 positions shown · non-contrast
Comparison: Earlier same day ; 06/23/2014 ; 08/29/2013

CLINICAL DATA: Endotracheal and enteric tube placement.

EXAM:
PORTABLE CHEST - 1 VIEW

[AP]
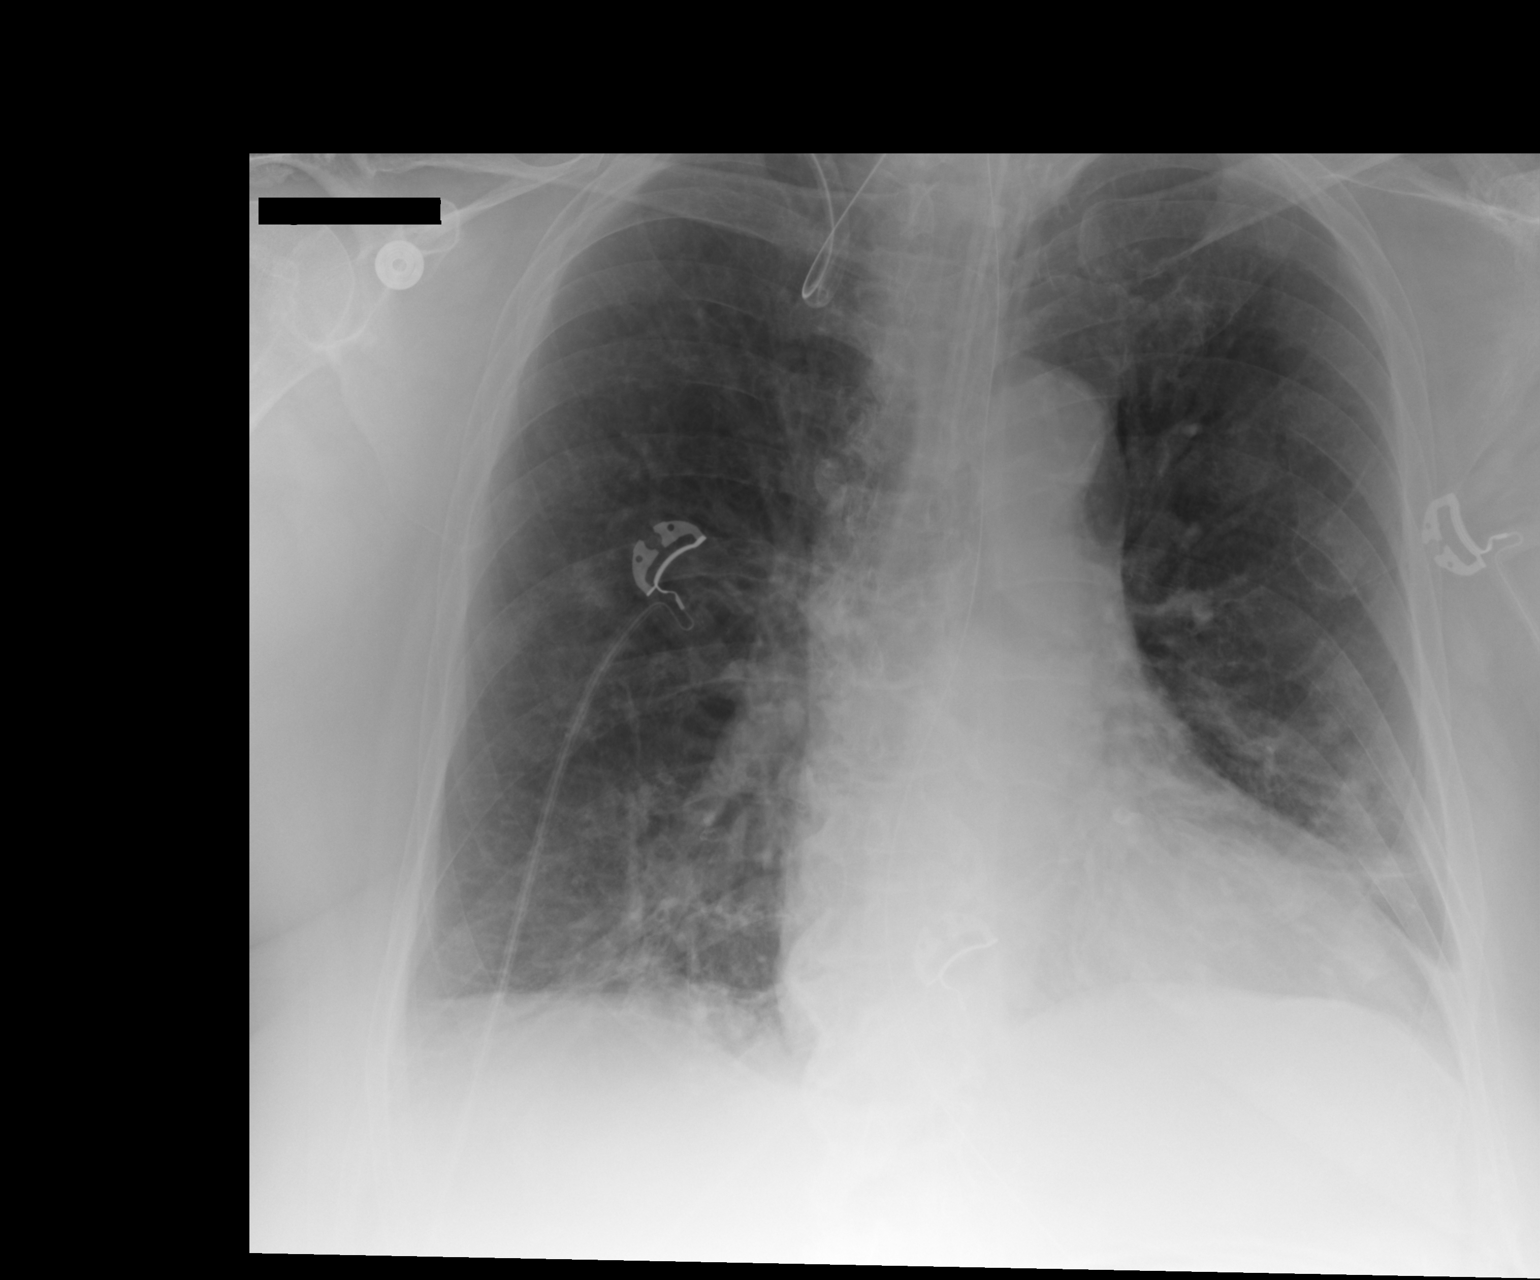

[1 of 1 positions shown; findings below may reference images not displayed]

FINDINGS: Grossly unchanged borderline enlarged cardiac silhouette and
mediastinal contours with atherosclerotic plaque within the thoracic
aorta. Endotracheal tube overlies the tracheal air column with tip
superior to the carina. Enteric tube tip terminates below the left
hemidiaphragm. No pneumothorax. Grossly unchanged bibasilar
heterogeneous opacities. No new focal airspace opacities. No pleural
effusion or pneumothorax. No evidence of edema. No acute osseus
abnormalities.
IMPRESSION: 1. Appropriately positioned support apparatus as above. No
pneumothorax.
2. Similar findings of lung hyperexpansion and bibasilar opacities,
left greater than right, likely atelectasis or scar.

## 2016-04-30 IMAGING — CR DG CHEST 1V PORT
1 series · 1 of 1 positions shown · non-contrast
Comparison: 06/23/2014

CLINICAL DATA: Acute onset shortness of breath and weakness

EXAM:
PORTABLE CHEST - 1 VIEW

[AP]
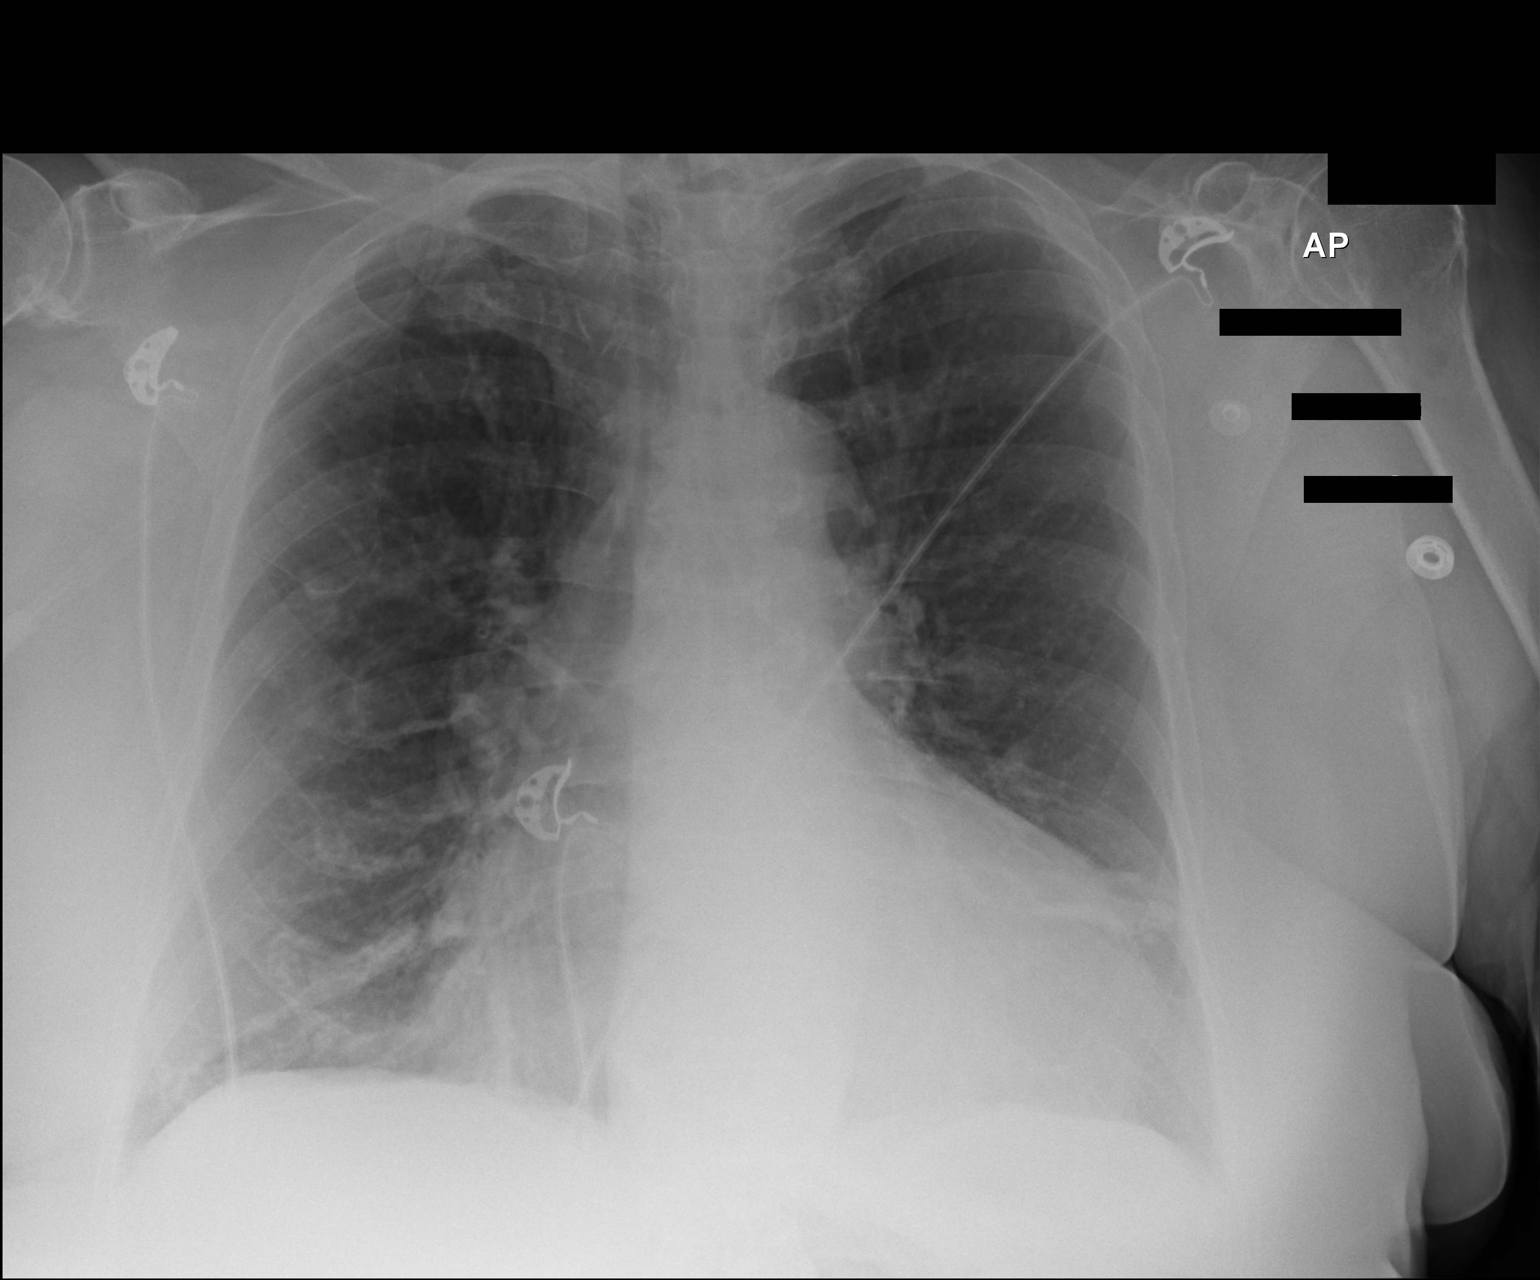

[1 of 1 positions shown; findings below may reference images not displayed]

FINDINGS: Stable cardiomegaly with improving bibasilar atelectasis. No current
pneumonia, edema, collapse or consolidation. No effusion or
pneumothorax. Trachea midline. Degenerative changes of the spine and
shoulders.
IMPRESSION: cardiomegaly with improving bibasilar atelectasis.

## 2016-05-01 IMAGING — CR DG CHEST 1V PORT
2 series · 2 of 2 positions shown · non-contrast
Comparison: 06/24/2014

CLINICAL DATA: Respiratory failure

EXAM:
PORTABLE CHEST - 1 VIEW

[AP (1 of 2)]
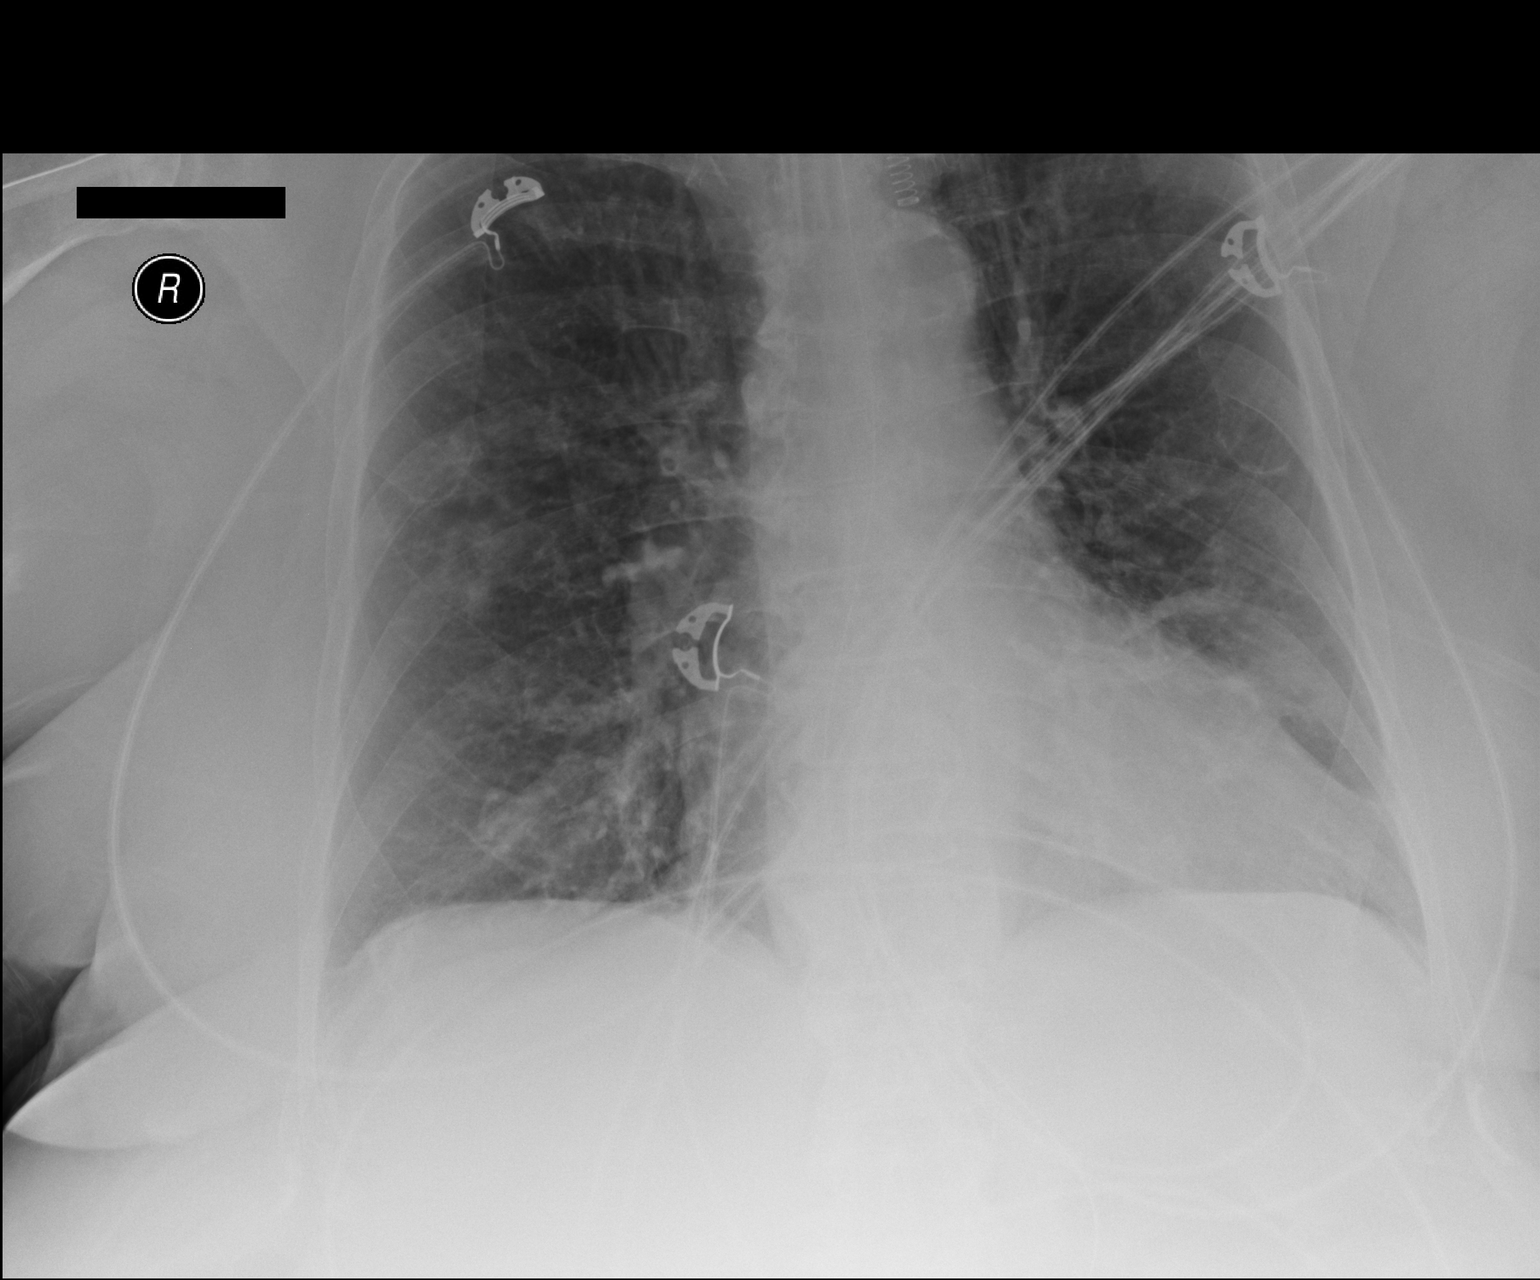

[AP (2 of 2)]
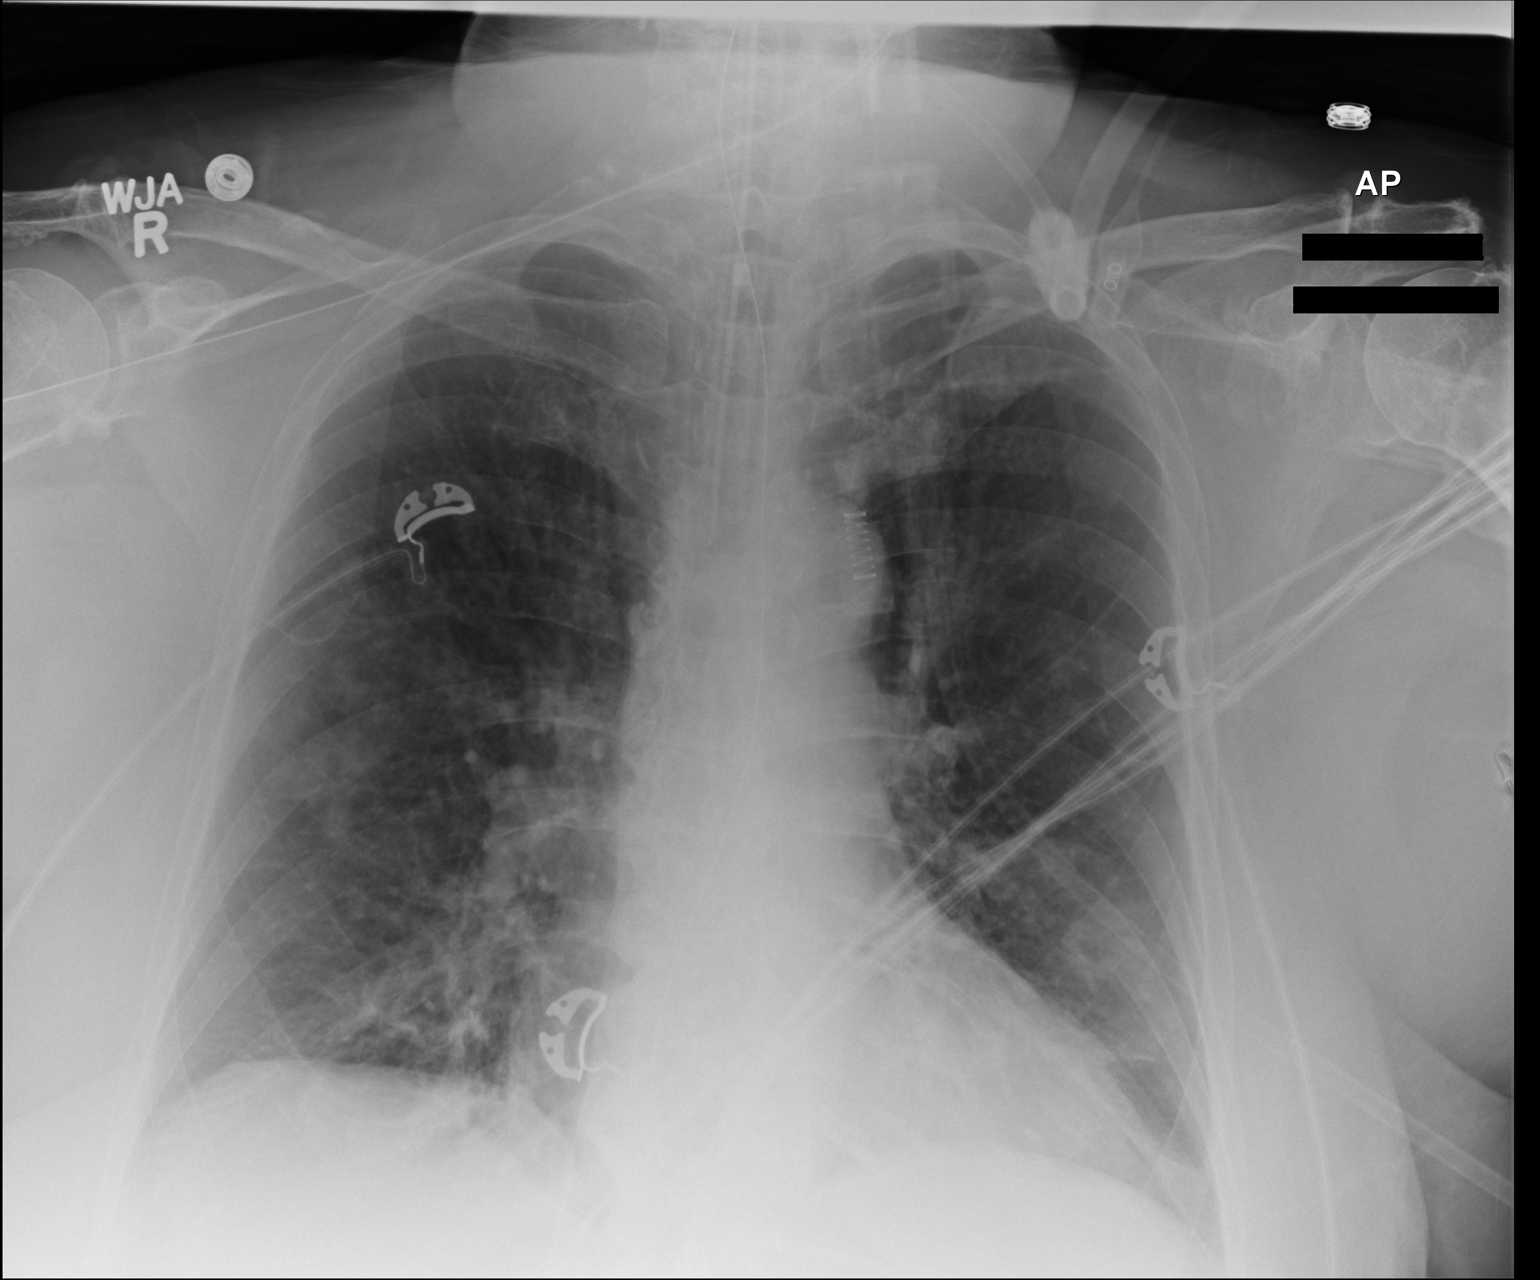

[2 of 2 positions shown; findings below may reference images not displayed]

FINDINGS: Endotracheal tube is 7 cm above the carina. NG tube enters the
stomach. Patchy lingular and right basilar airspace opacity, similar
prior study. Increasing patchy right upper lobe opacity. No
effusions. No acute bony abnormality.
IMPRESSION: Patchy bibasilar opacities are stable. Slight increased right upper
lobe airspace opacity.

Endotracheal tube retracted, now 7 cm above the carina.
